# Patient Record
Sex: Female | Born: 1985
Health system: Southern US, Community
[De-identification: ages and names within clinical notes are randomized; demographics above are authoritative.]

## PROBLEM LIST (undated history)

## (undated) ENCOUNTER — Ambulatory Visit (HOSPITAL_COMMUNITY): Admission: EM | Discharge status: Absent without leave | Payer: No Typology Code available for payment source

## (undated) DIAGNOSIS — F4310 Post-traumatic stress disorder, unspecified: Secondary | ICD-10-CM

## (undated) DIAGNOSIS — F410 Panic disorder [episodic paroxysmal anxiety] without agoraphobia: Secondary | ICD-10-CM

## (undated) DIAGNOSIS — J309 Allergic rhinitis, unspecified: Secondary | ICD-10-CM

## (undated) DIAGNOSIS — G43009 Migraine without aura, not intractable, without status migrainosus: Secondary | ICD-10-CM

## (undated) DIAGNOSIS — R51 Headache: Secondary | ICD-10-CM

## (undated) DIAGNOSIS — D582 Other hemoglobinopathies: Secondary | ICD-10-CM

## (undated) DIAGNOSIS — K3184 Gastroparesis: Secondary | ICD-10-CM

## (undated) DIAGNOSIS — R519 Headache, unspecified: Secondary | ICD-10-CM

## (undated) DIAGNOSIS — D332 Benign neoplasm of brain, unspecified: Secondary | ICD-10-CM

## (undated) DIAGNOSIS — R011 Cardiac murmur, unspecified: Secondary | ICD-10-CM

## (undated) DIAGNOSIS — R569 Unspecified convulsions: Secondary | ICD-10-CM

## (undated) DIAGNOSIS — M797 Fibromyalgia: Secondary | ICD-10-CM

## (undated) DIAGNOSIS — Z1331 Encounter for screening for depression: Secondary | ICD-10-CM

## (undated) DIAGNOSIS — I10 Essential (primary) hypertension: Secondary | ICD-10-CM

## (undated) DIAGNOSIS — M549 Dorsalgia, unspecified: Secondary | ICD-10-CM

## (undated) DIAGNOSIS — E559 Vitamin D deficiency, unspecified: Secondary | ICD-10-CM

## (undated) DIAGNOSIS — R52 Pain, unspecified: Secondary | ICD-10-CM

## (undated) DIAGNOSIS — N63 Unspecified lump in unspecified breast: Secondary | ICD-10-CM

## (undated) DIAGNOSIS — F32A Depression, unspecified: Secondary | ICD-10-CM

## (undated) DIAGNOSIS — I499 Cardiac arrhythmia, unspecified: Secondary | ICD-10-CM

## (undated) DIAGNOSIS — M5416 Radiculopathy, lumbar region: Secondary | ICD-10-CM

## (undated) DIAGNOSIS — K219 Gastro-esophageal reflux disease without esophagitis: Secondary | ICD-10-CM

## (undated) DIAGNOSIS — F329 Major depressive disorder, single episode, unspecified: Secondary | ICD-10-CM

## (undated) HISTORY — DX: Headache, unspecified: R51.9

## (undated) HISTORY — DX: Allergic rhinitis, unspecified: J30.9

## (undated) HISTORY — DX: Vitamin D deficiency, unspecified: E55.9

## (undated) HISTORY — DX: Cardiac arrhythmia, unspecified: I49.9

## (undated) HISTORY — DX: Other hemoglobinopathies: D58.2

## (undated) HISTORY — DX: Radiculopathy, lumbar region: M54.16

## (undated) HISTORY — DX: Encounter for screening for depression: Z13.31

## (undated) HISTORY — DX: Pain, unspecified: R52

## (undated) HISTORY — DX: Migraine without aura, not intractable, without status migrainosus: G43.009

## (undated) HISTORY — DX: Headache: R51

## (undated) HISTORY — DX: Gastro-esophageal reflux disease without esophagitis: K21.9

## (undated) HISTORY — DX: Essential (primary) hypertension: I10

## (undated) HISTORY — DX: Cardiac murmur, unspecified: R01.1

## (undated) HISTORY — PX: BREAST BIOPSY: SHX20

## (undated) HISTORY — DX: Dorsalgia, unspecified: M54.9

---
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
## 2004-01-07 ENCOUNTER — Emergency Department (HOSPITAL_COMMUNITY): Admission: EM | Admit: 2004-01-07 | Discharge: 2004-01-07 | Payer: Self-pay | Admitting: Emergency Medicine

## 2004-02-16 ENCOUNTER — Emergency Department (HOSPITAL_COMMUNITY): Admission: EM | Admit: 2004-02-16 | Discharge: 2004-02-16 | Payer: Self-pay | Admitting: Emergency Medicine

## 2004-03-06 ENCOUNTER — Emergency Department (HOSPITAL_COMMUNITY): Admission: EM | Admit: 2004-03-06 | Discharge: 2004-03-07 | Payer: Self-pay | Admitting: Emergency Medicine

## 2004-04-04 ENCOUNTER — Emergency Department (HOSPITAL_COMMUNITY): Admission: EM | Admit: 2004-04-04 | Discharge: 2004-04-04 | Payer: Self-pay | Admitting: Emergency Medicine

## 2004-04-10 ENCOUNTER — Inpatient Hospital Stay (HOSPITAL_COMMUNITY): Admission: AD | Admit: 2004-04-10 | Discharge: 2004-04-10 | Payer: Self-pay | Admitting: Obstetrics and Gynecology

## 2004-04-14 ENCOUNTER — Emergency Department (HOSPITAL_COMMUNITY): Admission: EM | Admit: 2004-04-14 | Discharge: 2004-04-14 | Payer: Self-pay | Admitting: Emergency Medicine

## 2004-05-21 ENCOUNTER — Emergency Department (HOSPITAL_COMMUNITY): Admission: EM | Admit: 2004-05-21 | Discharge: 2004-05-22 | Payer: Self-pay | Admitting: *Deleted

## 2004-07-04 ENCOUNTER — Emergency Department (HOSPITAL_COMMUNITY): Admission: EM | Admit: 2004-07-04 | Discharge: 2004-07-04 | Payer: Self-pay | Admitting: Emergency Medicine

## 2004-07-27 ENCOUNTER — Emergency Department (HOSPITAL_COMMUNITY): Admission: EM | Admit: 2004-07-27 | Discharge: 2004-07-28 | Payer: Self-pay | Admitting: Emergency Medicine

## 2004-12-23 ENCOUNTER — Ambulatory Visit: Payer: Self-pay | Admitting: Family Medicine

## 2005-02-28 ENCOUNTER — Ambulatory Visit: Payer: Self-pay | Admitting: Family Medicine

## 2005-03-12 ENCOUNTER — Ambulatory Visit: Payer: Self-pay | Admitting: Family Medicine

## 2005-03-30 ENCOUNTER — Emergency Department (HOSPITAL_COMMUNITY): Admission: EM | Admit: 2005-03-30 | Discharge: 2005-03-30 | Payer: Self-pay | Admitting: Emergency Medicine

## 2005-04-15 ENCOUNTER — Ambulatory Visit: Payer: Self-pay | Admitting: Psychiatry

## 2005-04-15 ENCOUNTER — Inpatient Hospital Stay (HOSPITAL_COMMUNITY): Admission: RE | Admit: 2005-04-15 | Discharge: 2005-04-19 | Payer: Self-pay | Admitting: Psychiatry

## 2005-04-25 ENCOUNTER — Emergency Department (HOSPITAL_COMMUNITY): Admission: AD | Admit: 2005-04-25 | Discharge: 2005-04-25 | Payer: Self-pay | Admitting: Emergency Medicine

## 2005-06-23 ENCOUNTER — Emergency Department (HOSPITAL_COMMUNITY): Admission: EM | Admit: 2005-06-23 | Discharge: 2005-06-23 | Payer: Self-pay | Admitting: Family Medicine

## 2005-06-30 ENCOUNTER — Ambulatory Visit: Payer: Self-pay | Admitting: Internal Medicine

## 2005-06-30 ENCOUNTER — Inpatient Hospital Stay (HOSPITAL_COMMUNITY): Admission: EM | Admit: 2005-06-30 | Discharge: 2005-07-03 | Payer: Self-pay | Admitting: Emergency Medicine

## 2005-07-03 ENCOUNTER — Inpatient Hospital Stay (HOSPITAL_COMMUNITY): Admission: RE | Admit: 2005-07-03 | Discharge: 2005-07-07 | Payer: Self-pay | Admitting: Psychiatry

## 2005-07-03 ENCOUNTER — Ambulatory Visit: Payer: Self-pay | Admitting: Psychiatry

## 2005-07-13 ENCOUNTER — Emergency Department (HOSPITAL_COMMUNITY): Admission: EM | Admit: 2005-07-13 | Discharge: 2005-07-14 | Payer: Self-pay | Admitting: Emergency Medicine

## 2005-07-29 ENCOUNTER — Emergency Department (HOSPITAL_COMMUNITY): Admission: EM | Admit: 2005-07-29 | Discharge: 2005-07-29 | Payer: Self-pay | Admitting: Family Medicine

## 2005-10-19 ENCOUNTER — Inpatient Hospital Stay (HOSPITAL_COMMUNITY): Admission: EM | Admit: 2005-10-19 | Discharge: 2005-10-24 | Payer: Self-pay | Admitting: Emergency Medicine

## 2005-11-20 ENCOUNTER — Encounter: Payer: Self-pay | Admitting: Family Medicine

## 2005-11-20 ENCOUNTER — Other Ambulatory Visit: Admission: RE | Admit: 2005-11-20 | Discharge: 2005-11-20 | Payer: Self-pay | Admitting: Family Medicine

## 2005-11-20 ENCOUNTER — Ambulatory Visit: Payer: Self-pay | Admitting: Family Medicine

## 2005-11-21 ENCOUNTER — Encounter (INDEPENDENT_AMBULATORY_CARE_PROVIDER_SITE_OTHER): Payer: Self-pay | Admitting: Family Medicine

## 2005-11-21 LAB — CONVERTED CEMR LAB: Pap Smear: NORMAL

## 2005-11-23 ENCOUNTER — Emergency Department (HOSPITAL_COMMUNITY): Admission: EM | Admit: 2005-11-23 | Discharge: 2005-11-23 | Payer: Self-pay | Admitting: Emergency Medicine

## 2005-12-27 ENCOUNTER — Inpatient Hospital Stay (HOSPITAL_COMMUNITY): Admission: EM | Admit: 2005-12-27 | Discharge: 2006-01-01 | Payer: Self-pay | Admitting: Emergency Medicine

## 2005-12-27 DIAGNOSIS — F450 Somatization disorder: Secondary | ICD-10-CM | POA: Insufficient documentation

## 2006-01-01 ENCOUNTER — Ambulatory Visit: Payer: Self-pay | Admitting: Psychiatry

## 2006-01-01 ENCOUNTER — Inpatient Hospital Stay (HOSPITAL_COMMUNITY): Admission: AD | Admit: 2006-01-01 | Discharge: 2006-01-05 | Payer: Self-pay | Admitting: Psychiatry

## 2006-01-04 ENCOUNTER — Encounter: Payer: Self-pay | Admitting: Emergency Medicine

## 2006-02-10 ENCOUNTER — Emergency Department (HOSPITAL_COMMUNITY): Admission: EM | Admit: 2006-02-10 | Discharge: 2006-02-10 | Payer: Self-pay | Admitting: Emergency Medicine

## 2006-02-16 ENCOUNTER — Ambulatory Visit: Payer: Self-pay | Admitting: Family Medicine

## 2006-06-08 ENCOUNTER — Emergency Department (HOSPITAL_COMMUNITY): Admission: EM | Admit: 2006-06-08 | Discharge: 2006-06-08 | Payer: Self-pay | Admitting: Emergency Medicine

## 2006-07-21 ENCOUNTER — Emergency Department (HOSPITAL_COMMUNITY): Admission: EM | Admit: 2006-07-21 | Discharge: 2006-07-21 | Payer: Self-pay | Admitting: Emergency Medicine

## 2006-07-24 ENCOUNTER — Ambulatory Visit: Payer: Self-pay | Admitting: Family Medicine

## 2006-08-28 ENCOUNTER — Emergency Department (HOSPITAL_COMMUNITY): Admission: EM | Admit: 2006-08-28 | Discharge: 2006-08-28 | Payer: Self-pay | Admitting: Emergency Medicine

## 2006-12-06 ENCOUNTER — Emergency Department (HOSPITAL_COMMUNITY): Admission: EM | Admit: 2006-12-06 | Discharge: 2006-12-06 | Payer: Self-pay | Admitting: Emergency Medicine

## 2007-03-08 ENCOUNTER — Emergency Department (HOSPITAL_COMMUNITY): Admission: EM | Admit: 2007-03-08 | Discharge: 2007-03-09 | Payer: Self-pay | Admitting: Emergency Medicine

## 2007-03-10 ENCOUNTER — Emergency Department (HOSPITAL_COMMUNITY): Admission: EM | Admit: 2007-03-10 | Discharge: 2007-03-11 | Payer: Self-pay | Admitting: Emergency Medicine

## 2007-03-13 ENCOUNTER — Inpatient Hospital Stay (HOSPITAL_COMMUNITY): Admission: EM | Admit: 2007-03-13 | Discharge: 2007-03-14 | Payer: Self-pay | Admitting: Emergency Medicine

## 2007-03-21 ENCOUNTER — Emergency Department (HOSPITAL_COMMUNITY): Admission: EM | Admit: 2007-03-21 | Discharge: 2007-03-21 | Payer: Self-pay | Admitting: Emergency Medicine

## 2007-08-02 ENCOUNTER — Telehealth (INDEPENDENT_AMBULATORY_CARE_PROVIDER_SITE_OTHER): Payer: Self-pay | Admitting: *Deleted

## 2007-08-06 ENCOUNTER — Encounter (INDEPENDENT_AMBULATORY_CARE_PROVIDER_SITE_OTHER): Payer: Self-pay | Admitting: Family Medicine

## 2007-08-06 DIAGNOSIS — R569 Unspecified convulsions: Secondary | ICD-10-CM | POA: Insufficient documentation

## 2007-08-06 DIAGNOSIS — F609 Personality disorder, unspecified: Secondary | ICD-10-CM | POA: Insufficient documentation

## 2007-08-06 DIAGNOSIS — F1910 Other psychoactive substance abuse, uncomplicated: Secondary | ICD-10-CM | POA: Insufficient documentation

## 2007-08-06 DIAGNOSIS — F319 Bipolar disorder, unspecified: Secondary | ICD-10-CM | POA: Insufficient documentation

## 2007-08-06 DIAGNOSIS — F1210 Cannabis abuse, uncomplicated: Secondary | ICD-10-CM | POA: Insufficient documentation

## 2007-08-17 ENCOUNTER — Emergency Department (HOSPITAL_COMMUNITY): Admission: EM | Admit: 2007-08-17 | Discharge: 2007-08-17 | Payer: Self-pay | Admitting: *Deleted

## 2007-12-02 ENCOUNTER — Emergency Department (HOSPITAL_COMMUNITY): Admission: EM | Admit: 2007-12-02 | Discharge: 2007-12-02 | Payer: Self-pay | Admitting: Emergency Medicine

## 2008-01-19 ENCOUNTER — Inpatient Hospital Stay (HOSPITAL_COMMUNITY): Admission: AD | Admit: 2008-01-19 | Discharge: 2008-01-20 | Payer: Self-pay | Admitting: Obstetrics and Gynecology

## 2008-03-20 ENCOUNTER — Telehealth (INDEPENDENT_AMBULATORY_CARE_PROVIDER_SITE_OTHER): Payer: Self-pay | Admitting: *Deleted

## 2008-03-23 ENCOUNTER — Encounter (INDEPENDENT_AMBULATORY_CARE_PROVIDER_SITE_OTHER): Payer: Self-pay | Admitting: *Deleted

## 2008-10-17 ENCOUNTER — Emergency Department (HOSPITAL_COMMUNITY): Admission: EM | Admit: 2008-10-17 | Discharge: 2008-10-17 | Payer: Self-pay | Admitting: Emergency Medicine

## 2008-11-28 ENCOUNTER — Emergency Department (HOSPITAL_COMMUNITY): Admission: EM | Admit: 2008-11-28 | Discharge: 2008-11-28 | Payer: Self-pay | Admitting: Emergency Medicine

## 2008-12-30 ENCOUNTER — Emergency Department (HOSPITAL_COMMUNITY): Admission: EM | Admit: 2008-12-30 | Discharge: 2008-12-30 | Payer: Self-pay | Admitting: Emergency Medicine

## 2009-01-06 ENCOUNTER — Emergency Department (HOSPITAL_COMMUNITY): Admission: EM | Admit: 2009-01-06 | Discharge: 2009-01-06 | Payer: Self-pay | Admitting: Physician Assistant

## 2009-05-08 ENCOUNTER — Emergency Department (HOSPITAL_COMMUNITY): Admission: EM | Admit: 2009-05-08 | Discharge: 2009-05-08 | Payer: Self-pay | Admitting: Emergency Medicine

## 2009-09-06 ENCOUNTER — Encounter (INDEPENDENT_AMBULATORY_CARE_PROVIDER_SITE_OTHER): Payer: Self-pay | Admitting: Internal Medicine

## 2009-11-08 ENCOUNTER — Ambulatory Visit: Payer: Self-pay | Admitting: Physician Assistant

## 2009-11-08 DIAGNOSIS — N6459 Other signs and symptoms in breast: Secondary | ICD-10-CM | POA: Insufficient documentation

## 2009-11-08 DIAGNOSIS — N644 Mastodynia: Secondary | ICD-10-CM | POA: Insufficient documentation

## 2009-11-08 DIAGNOSIS — N920 Excessive and frequent menstruation with regular cycle: Secondary | ICD-10-CM | POA: Insufficient documentation

## 2009-11-08 LAB — CONVERTED CEMR LAB
ALT: 11 units/L (ref 0–35)
AST: 13 units/L (ref 0–37)
Albumin: 4.9 g/dL (ref 3.5–5.2)
Alkaline Phosphatase: 70 units/L (ref 39–117)
BUN: 7 mg/dL (ref 6–23)
Basophils Absolute: 0 10*3/uL (ref 0.0–0.1)
Basophils Relative: 0 % (ref 0–1)
Beta hcg, urine, semiquantitative: NEGATIVE
CO2: 23 meq/L (ref 19–32)
Calcium: 9.8 mg/dL (ref 8.4–10.5)
Chloride: 104 meq/L (ref 96–112)
Creatinine, Ser: 0.59 mg/dL (ref 0.40–1.20)
Eosinophils Absolute: 0 10*3/uL (ref 0.0–0.7)
Eosinophils Relative: 0 % (ref 0–5)
Glucose, Bld: 79 mg/dL (ref 70–99)
HCT: 37.5 % (ref 36.0–46.0)
Hemoglobin: 13 g/dL (ref 12.0–15.0)
Lymphocytes Relative: 39 % (ref 12–46)
Lymphs Abs: 1.8 10*3/uL (ref 0.7–4.0)
MCHC: 34.7 g/dL (ref 30.0–36.0)
MCV: 87.4 fL (ref 78.0–100.0)
Monocytes Absolute: 0.3 10*3/uL (ref 0.1–1.0)
Monocytes Relative: 7 % (ref 3–12)
Neutro Abs: 2.5 10*3/uL (ref 1.7–7.7)
Neutrophils Relative %: 54 % (ref 43–77)
Platelets: 248 10*3/uL (ref 150–400)
Potassium: 3.7 meq/L (ref 3.5–5.3)
Prolactin: 6.8 ng/mL
RBC: 4.29 M/uL (ref 3.87–5.11)
RDW: 12.7 % (ref 11.5–15.5)
Sodium: 141 meq/L (ref 135–145)
TSH: 2.52 microintl units/mL (ref 0.350–4.500)
Total Bilirubin: 1.1 mg/dL (ref 0.3–1.2)
Total Protein: 7.6 g/dL (ref 6.0–8.3)
WBC: 4.7 10*3/uL (ref 4.0–10.5)

## 2009-11-13 ENCOUNTER — Encounter: Admission: RE | Admit: 2009-11-13 | Discharge: 2009-11-13 | Payer: Self-pay | Admitting: Internal Medicine

## 2009-11-13 ENCOUNTER — Encounter: Payer: Self-pay | Admitting: Physician Assistant

## 2009-12-14 ENCOUNTER — Ambulatory Visit: Payer: Self-pay | Admitting: Physician Assistant

## 2009-12-14 DIAGNOSIS — A088 Other specified intestinal infections: Secondary | ICD-10-CM | POA: Insufficient documentation

## 2009-12-14 DIAGNOSIS — R042 Hemoptysis: Secondary | ICD-10-CM | POA: Insufficient documentation

## 2009-12-14 LAB — CONVERTED CEMR LAB
Bilirubin Urine: NEGATIVE
Blood in Urine, dipstick: NEGATIVE
Glucose, Urine, Semiquant: NEGATIVE
KOH Prep: NEGATIVE
Ketones, urine, test strip: NEGATIVE
Nitrite: NEGATIVE
Pap Smear: NEGATIVE
Protein, U semiquant: NEGATIVE
Specific Gravity, Urine: <1.005
Urobilinogen, UA: 1
WBC Urine, dipstick: NEGATIVE
Whiff Test: NEGATIVE
pH: 6

## 2009-12-16 LAB — CONVERTED CEMR LAB
Chlamydia, DNA Probe: NEGATIVE
GC Probe Amp, Genital: NEGATIVE

## 2009-12-18 ENCOUNTER — Encounter: Payer: Self-pay | Admitting: Physician Assistant

## 2010-04-07 ENCOUNTER — Emergency Department (HOSPITAL_COMMUNITY): Admission: EM | Admit: 2010-04-07 | Discharge: 2010-04-07 | Payer: Self-pay | Admitting: Emergency Medicine

## 2010-05-03 ENCOUNTER — Emergency Department (HOSPITAL_COMMUNITY): Admission: EM | Admit: 2010-05-03 | Discharge: 2010-05-03 | Payer: Self-pay | Admitting: Family Medicine

## 2010-12-15 ENCOUNTER — Encounter: Payer: Self-pay | Admitting: Internal Medicine

## 2010-12-24 NOTE — Letter (Signed)
Summary: *HSN Results Follow up  [facility]  [address] [place], [place] [address]   Phone: [phone]  Fax: [phone]      11/13/2009   Maria PARDOE [address] [place], [place]  [address]   Dear  [name],                            ____S.Drinkard,FNP   ____D. Gore,FNP       ____B. McPherson,MD   ____V. Rankins,MD    ____E. Mulberry,MD    ____N. Daphine Deutscher, FNP  ____D. Reche Dixon, MD    ____K. Philipp Deputy, MD    __x__S. Alben Spittle, PA-C     This letter is to inform you that your recent test(s):  _______Pap Smear    ____x___Lab Test     _______X-ray    ___x____ is within acceptable limits  _______ requires a medication change  _______ requires a follow-up lab visit  _______ requires a follow-up visit with your provider   Comments:       _________________________________________________________ If you have any questions, please contact our office                     Sincerely,  Tereso Newcomer PA-C [facility]

## 2010-12-24 NOTE — Assessment & Plan Note (Signed)
Summary: breast discharge/ gk   Vital Signs:  Patient profile:   25 year old female Menstrual status:  irregular LMP:     10/24/2009 Height:      67 inches Weight:      131 pounds BMI:     20.59 Temp:     98.4 degrees F oral Pulse rate:   88 / minute Pulse rhythm:   regular Resp:     18 per minute BP sitting:   123 / 76  (left arm)  Vitals Entered By: Arm[place] Shannon (November 08, 2009 11:49 AM) CC: pt says she has lumps in her breast and her breast gives her pain...Marland KitchenMarland Kitchen pt says she has had this pain for over a year...Marland KitchenMarland Kitchen pt says the discharge was clear..... Is Patient Diabetic? No Pain Assessment Patient in pain? no       Does patient need assistance? Functional Status Self care Ambulation Normal LMP (date): 10/24/2009     Menstrual Status irregular Enter LMP: 10/24/2009 Last PAP Result Normal   CC:  pt says she has lumps in her breast and her breast gives her pain...Marland KitchenMarland Kitchen pt says she has had this pain for over a year...Marland KitchenMarland Kitchen pt says the discharge was clear.....Marland Kitchen  History of Present Illness: First meeting with patient.  Previously followed by Dr. Barbaraann Barthel.  Patient with a h/o bipolar d/o and somatization d/o.  She follows at [facility].  Reported h/o seizure d/o never documented.  She has sickle cell trait but according to records continues to report she has sickle cell disease.  Paper chart reviewed in office today.  She presents with pain and swelling bilat breasts x 1 year+.  She says the breasts both feel hot.  She notes swelling around her nipple - not assoc with menses.  She notes discharge from left breast 12/2008 - went to ED; referred to breast center; never went.  No further discharge.  The discharge was clear; no blood or colored d/c.  The discomfort is constant- all month long.  She states she has felt some lumps in both breasts; no single lesion.  The tenderness may be a little worse around cycle.  She has irregular menses and reports cycles 2 x per month; heavy; clots.  Her  LMP 12.3.2010.  [facility]    Allergies (verified): 1)  ! Jonne Ply  Past History:  Past Medical History: Last updated: 08/06/2007 Sickle Cell Trait(pt does not have SCD Seizure disorder(not confirmed, see Dr.Hicklings' evaluation 7/0:  NARCOTIC ABUSE (ICD-305.90) MARIJUANA ABUSE (ICD-305.20) PERSONALITY DISORDER (ICD-301.9) DISORDER, BIPOLAR NOS (ICD-296.80)  Past Surgical History: Last updated: 08/06/2007 February 2007: Hospitalized for Bipolar Disorder  Family History: Breast CA - MGM Brain CA - mom  Social History: sexually active - single partner drinks tea occ. + cigs (3-4 per day) no ETOH; denies drug abuse  Physical Exam  General:  alert, well-developed, and well-nourished.   Head:  normocephalic and atraumatic.   Breasts:  skin/areolae normal, no masses, no abnormal thickening, and no nipple discharge.   fibrous changes noted exquisite tenderness with light palpation of both breasts  Neurologic:  alert & oriented X3 and cranial nerves II-XII intact.   Skin:  turgor normal.   Psych:  normally interactive.     Impression & Recommendations:  Problem # 1:  NIPPLE DISCHARGE (ICD-611.79) not sure if she experienced multiductal or uniductal d/c no recurrence check labs order u/s  Orders: Urine Pregnancy Test  (62952) T-Comprehensive Metabolic Panel (84132-44010) T-CBC w/Diff (27253-66440)  T-TSH [phone]) T-Prolactin [phone]) Ultrasound (Ultrasound)  Problem # 2:  BREAST TENDERNESS (ICD-611.71)  suspect fibrocystic breast disease rec NSAIDs reassurance breast ultrasound given discharge seen avoid caffeine and smoking check urine preg  Orders: Urine Pregnancy Test  (84696) T-Comprehensive Metabolic Panel (29528-41324) T-CBC w/Diff (40102-72536) T-TSH (64403-47425) T-Prolactin (95638-75643) Ultrasound (Ultrasound)  Problem # 3:  Preventive Health Care (ICD-V70.0) offered pap today she declined will  reschedule  Problem # 4:  MENORRHAGIA (ICD-626.2) schedule pap ultrasound in 2009 without fibroids; she did have ovarian cysts may benefit from OCPs  Complete Medication List: 1)  Colace 100 Mg Caps (Docusate sodium) .... Two times a day 2)  Risperdal 0.25 Mg Tabs (Risperidone) .Marland Kitchen.. 1 by mouth every morning (per [facility]) 3)  Xanax 0.5 Mg Tabs (Alprazolam) .... Three times a day (per [facility]) 4)  Valium 10 Mg Tabs (Diazepam) .... Two times a day (per [facility]) 5)  Naprosyn 500 Mg Tabs (Naproxen) .... Take 1 tablet by mouth two times a day with food as needed for pain.  Patient Instructions: 1)  Try to eliminate caffeine from your diet. 2)  Tobacco is very bad for your health and your loved ones ! You should stop smoking !  3)  Stop smoking tips: Choose a quit date. Cut down before the quit date. Decide what you will do as a substitute when you feel the urge to smoke(gum, toothpick, exercise).  4)  Please schedule a follow-up appointment in 1 month with Scott for CPP. 5)  Use tylenol or naprosyn for breast pain.  Try to use tylenol first and limit the amount of naprosyn you use.  6)  Take 650 - 1000 mg of tylenol every 4-6 hours as needed for relief of pain or comfort of fever. Avoid taking more than 4000 mg in a 24 hour period( can cause liver damage in higher doses).  Prescriptions: NAPROSYN 500 MG TABS (NAPROXEN) Take 1 tablet by mouth two times a day with food as needed for pain.  #30 x 3   Entered and Authorized by:   Tereso Newcomer PA-C   Signed by:   Tereso Newcomer PA-C on 11/08/2009   Method used:   Print then Give to Patient   RxID:   [id number]    Pelvic US  Procedure date:  01/20/2008  Findings:      IMPRESSION   1. Bilateral ovarian cysts. No followup required. Otherwise negative   pelvic   ultrasound.  Laboratory Results   Urine Tests      Urine HCG: negative

## 2010-12-24 NOTE — Progress Notes (Signed)
Summary: IN PAIN  Phone Note Call from Patient Call back at [phone]   Caller: Patient Reason for Call: Acute Illness Summary of Call: RANKINS PT. HAS A LOT OF PAIN IN HER NECK,SHOULDER AND BOTH LEGS AND WOULD LIKE TO KNOW  WHAT SHOULD SHE DO. 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

## 2010-12-24 NOTE — Letter (Signed)
Summary: *HSN Results Follow up  [facility]  [address] [place], [place] [address]   Phone: [phone]  Fax: [phone]      12/18/2009   [name] [address] [place], [place]  [address]   Dear  [name],                            ____S.Drinkard,FNP   ____D. Gore,FNP       ____B. McPherson,MD   ____V. Rankins,MD    ____E. Mulberry,MD    ____N. Daphine Deutscher, FNP  ____D. Reche Dixon, MD    ____K. Philipp Deputy, MD    __x__S. Alben Spittle, PA-C     This letter is to inform you that your recent test(s):  __x_____Pap Smear    _______Lab Test     _______X-ray    ___x____ is within acceptable limits  _______ requires a medication change  _______ requires a follow-up lab visit  _______ requires a follow-up visit with your provider   Comments:       _________________________________________________________ If you have any questions, please contact our office                     Sincerely,  Tereso Newcomer PA-C [facility]

## 2010-12-24 NOTE — Letter (Signed)
Summary: MAILED RECORDS TO [facility]  MAILED RECORDS TO [facility]   Imported By: Arta Bruce 09/06/2009 11:00:13  _____________________________________________________________________  External Attachment:    Type:   Image     Comment:   External Document

## 2010-12-24 NOTE — Progress Notes (Signed)
Summary: Office Visit//DEPRESSION SCREENING  Office Visit//DEPRESSION SCREENING   Imported By: Arta Bruce 02/08/2010 12:37:37  _____________________________________________________________________  External Attachment:    Type:   Image     Comment:   External Document

## 2010-12-24 NOTE — Assessment & Plan Note (Signed)
Summary: CPP   Vital Signs:  Patient profile:   25 year old female Menstrual status:  irregular Height:      67 inches Weight:      127 pounds BMI:     19.96 Temp:     100.7 degrees F oral Pulse rate:   126 / minute Pulse rhythm:   regular Resp:     16 per minute BP sitting:   114 / 78  (left arm) Cuff size:   regular  Vitals Entered By: Arm[place] Shannon (December 14, 2009 10:15 AM) CC: CPP... pt doesn't feel good...Marland KitchenMarland Kitchen pt says she has been feeling like this for 3  days now..... pt says she has been coughing, shivers, headache and she says that it feels as if her skin is burning... Is Patient Diabetic? No Pain Assessment Patient in pain? no       Does patient need assistance? Functional Status Self care Ambulation Normal   CC:  CPP... pt doesn't feel good...Marland KitchenMarland Kitchen pt says she has been feeling like this for 3  days now..... pt says she has been coughing, shivers, and headache and she says that it feels as if her skin is burning....  History of Present Illness: Here for CPP.  PHQ9=17.  Follows at [facility].  Has a h/o bipolar d/o.  No suicidal ideations.  Has f/u appt next week. Feels like her meds need adjustment.  Health maint: G1P0 No h/o abnormal pap. She does report 2 cycles per month, heavy bleeding and clotting.  Used depo years ago with good success.  No h/o ultrasound.  Did have a h/o miscarriage. No vaginal discharge, odor, burning. Recent ultrasound of breasts due to mastalgia benign.  No need for mammo until she is 39.  Patient notes fever, chills, cough, nausea and vomiting over the last 3 days.  She has coughed up some blood.  No dyspnea.  Notes nasal congestion.  SHe is a smoker.  No chest pain.  Unable to keep any fluids down.  No syncope.  No lightheadedness.  Diarrhea started this morning.    Habits & Providers  Alcohol-Tobacco-Diet     Alcohol drinks/day: 0     Tobacco Status: current     Cigarette Packs/Day: <0.25  Exercise-Depression-Behavior     Does  Patient Exercise: yes     Type of exercise: walking     Times/week: <3     Have you felt down or hopeless? yes     Have you felt little pleasure in things? yes     Depression Counseling: follows with [facility]     STD Risk: never     Drug Use: marijuanna     Seat Belt Use: always  Problems Prior to Update: 1)  Hemoptysis Unspecified  (ICD-786.30) 2)  Preventive Health Care  (ICD-V70.0) 3)  Gastroenteritis, Viral  (ICD-008.8) 4)  Routine Gynecological Examination  (ICD-V72.31) 5)  Menorrhagia  (ICD-626.2) 6)  Nipple Discharge  (ICD-611.79) 7)  Breast Tenderness  (ICD-611.71) 8)  Somatization Disorder  (ICD-300.81) 9)  Seizure Disorder  (ICD-780.39) 10)  Narcotic Abuse  (ICD-305.90) 11)  Marijuana Abuse  (ICD-305.20) 12)  Personality Disorder  (ICD-301.9) 13)  Disorder, Bipolar Nos  (ICD-296.80)  Current Medications (verified): 1)  Colace 100 Mg  Caps (Docusate Sodium) .... Two Times A Day 2)  Risperdal 0.25 Mg Tabs (Risperidone) .Marland Kitchen.. 1 By Mouth Every Morning (Per [facility]) 3)  Xanax 0.5 Mg Tabs (Alprazolam) .... Three Times A Day (Per [facility]) 4)  Valium 10 Mg Tabs (  Diazepam) .... Two Times A Day (Per [facility]) 5)  Naprosyn 500 Mg Tabs (Naproxen) .... Take 1 Tablet By Mouth Two Times A Day With Food As Needed For Pain.  Allergies (verified): 1)  ! Jonne Ply  Past History:  Past Medical History: Last updated: 08/06/2007 Sickle Cell Trait(pt does not have SCD Seizure disorder(not confirmed, see Dr.Hicklings' evaluation 7/0:  NARCOTIC ABUSE (ICD-305.90) MARIJUANA ABUSE (ICD-305.20) PERSONALITY DISORDER (ICD-301.9) DISORDER, BIPOLAR NOS (ICD-296.80)  Past Surgical History: Last updated: 08/06/2007 February 2007: Hospitalized for Bipolar Disorder  Family History: Reviewed history from 11/08/2009 and no changes required. Breast CA - MGM Brain CA - mom  Social History: Smoking Status:  current Packs/Day:  <0.25 Drug Use:  marijuanna Does Patient Exercise:  yes Seat Belt Use:   always STD Risk:  never  Review of Systems       The patient complains of fever.  The patient denies chest pain, syncope, dyspnea on exertion, melena, hematochezia, and hematuria.    Physical Exam  General:  alert, well-developed, and well-nourished.   Head:  normocephalic and atraumatic.   Eyes:  pupils equal, pupils round, and pupils reactive to light.   Ears:  R ear normal and L ear normal.   Nose:  no external deformity.   Mouth:  pharynx pink and moist.   Neck:  supple, no thyromegaly, and no cervical lymphadenopathy.   Breasts:  deferred due to recent ultrasound Lungs:  normal breath sounds, no crackles, and no wheezes.   Heart:  regular rhythm, no murmur, and tachycardia.   Abdomen:  soft, non-tender, normal bowel sounds, and no hepatomegaly.   Rectal:  no external abnormalities.   Genitalia:  normal introitus, no external lesions, no vaginal discharge, mucosa pink and moist, no vaginal or cervical lesions, no vaginal atrophy, no friaility or hemorrhage, normal uterus size and position, and no adnexal masses or tenderness.   Msk:  normal ROM.   Extremities:  no edema Neurologic:  alert & oriented X3 and cranial nerves II-XII intact.   Skin:  turgor normal.   Psych:  normally interactive.     Impression & Recommendations:  Problem # 1:  ROUTINE GYNECOLOGICAL EXAMINATION (ICD-V72.31)  Orders: KOH/ WET [place] [id number]) T- GC Chlamydia (47829) T-Pap Smear, Thin Prep (56213) T-Syphilis Test (RPR) (08657-84696) T-HIV Antibody  (Reflex) (29528-41324)  Problem # 2:  GASTROENTERITIS, VIRAL (ICD-008.8)  give 1L IVF in the office as she is dehydrated as evidenced by tachycardia phenergan IM in the office rx for phenergan f/u next week if no better  Orders: IV Fluids 1st hr Non Medicare (40102)  Problem # 3:  MENORRHAGIA (ICD-626.2)  would like to try Depo again check ultrasound  Orders: Ultrasound (Ultrasound)  Problem # 4:  DISORDER, BIPOLAR NOS (ICD-296.80) f/u  with [facility]  Problem # 5:  PREVENTIVE HEALTH CARE (ICD-V70.0)  Orders: T-Lipid Profile (72536-64403)  Problem # 6:  HEMOPTYSIS UNSPECIFIED (ICD-786.30)  prob related to cough and gagging from gastroenteritis but since she is a smoker will check xray  Orders: Diagnostic X-Ray/Fluoroscopy (Diagnostic X-Ray/Flu) Admin of Therapeutic Inj  intramuscular or subcutaneous (47425) Promethazine up to 50mg  (J2550)  Complete Medication List: 1)  Colace 100 Mg Caps (Docusate sodium) .... Two times a day 2)  Risperdal 0.25 Mg Tabs (Risperidone) .Marland Kitchen.. 1 by mouth every morning (per [facility]) 3)  Xanax 0.5 Mg Tabs (Alprazolam) .... Three times a day (per [facility]) 4)  Valium 10 Mg Tabs (Diazepam) .... Two times a day (per [facility]) 5)  Naprosyn 500  Mg Tabs (Naproxen) .... Take 1 tablet by mouth two times a day with food as needed for pain. 6)  Promethazine Hcl 25 Mg Tabs (Promethazine hcl) .... 1/2 to 1 by mouth every 8 hours as needed [place] and vomiting  Patient Instructions: 1)  Take Calcium 600 mg + Vitamin D 400 International Units two times a day for bone health. 2)  Tobacco is very bad for your health and your loved ones ! You should stop smoking !  3)  Stop smoking tips: Choose a quit date. Cut down before the quit date. Decide what you will do as a substitute when you feel the urge to smoke(gum, toothpick, exercise).  4)  It is important that you exercise reguarly at least 20 minutes 5 times a week. If you develop chest pain, have severe difficulty breathing, or feel very tired, stop exercising immediately and seek medical attention.  5)  Schedule a follow up appointment with Lorin Picket next week if you are no better. 6)  If you feel worse over the weekend, go to the emergency room. 7)  Drink clear liquids for the next 24-48 hours. 8)  Do not use the phenergan unless you have to because this medicine will make you tired and all your other medicine make you tired as well. 9)  Get the chest xray as soon as you  can.  10)  Call me when you feel better so we can set you up for the Depo shot. 11)  We will send you a letter about your pap results.  12)  Make sure you arrange a follow up pap in one year. Prescriptions: PROMETHAZINE HCL 25 MG TABS (PROMETHAZINE HCL) 1/2 to 1 by mouth every 8 hours as needed [place] and vomiting  #20 x 0   Entered and Authorized by:   Tereso Newcomer PA-C   Signed by:   Tereso Newcomer PA-C on 12/14/2009   Method used:   Print then Give to Patient   RxID:   [id number]   Laboratory Results   Urine Tests  Date/Time Received: December 14, 2009 10:23 AM   Routine Urinalysis   Glucose: negative   (Normal Range: Negative) Bilirubin: negative   (Normal Range: Negative) Ketone: negative   (Normal Range: Negative) Spec. Gravity: <1.005   (Normal Range: 1.003-1.035) Blood: negative   (Normal Range: Negative) pH: 6.0   (Normal Range: 5.0-8.0) Protein: negative   (Normal Range: Negative) Urobilinogen: 1.0   (Normal Range: 0-1) Nitrite: negative   (Normal Range: Negative) Leukocyte Esterace: negative   (Normal Range: Negative)      Wet Mount Source: vaginal WBC/hpf: 1-5 Bacteria/hpf: rare Clue cells/hpf: few  Negative whiff Yeast/hpf: none Wet [place] KOH: Negative Trichomonas/hpf: none        Medication Administration  Injection # 1:    Medication: Promethazine up to 50mg     Diagnosis: GASTROENTERITIS, VIRAL (ICD-008.8)    Route: IM    Site: LUOQ gluteus    Exp Date: 03/2010    Lot #: [id number]    Mfr: [place]    Comments: 25mg  ndc:  9528-4132-44    Patient tolerated injection without complications    Given by: Armenia Shannon (December 14, 2009 2:30 PM)  Orders Added: 1)  Est. Patient age 43-39 [32] 2)  KOH/ [place] [id number] 3)  T- GC Chlamydia [25366] 4)  T-Pap Smear, Thin Prep [88142] 5)  T-Syphilis Test (RPR) [44034-74259] 6)  T-HIV Antibody  (Reflex) [56387-56433] 7)  Ultrasound [Ultrasound] 8)  T-Lipid Profile [  04540-98119] 9)   Diagnostic X-Ray/Fluoroscopy [Diagnostic X-Ray/Flu] 10)  IV Fluids 1st hr Non Medicare [96360] 11)  Admin of Therapeutic Inj  intramuscular or subcutaneous [96372] 12)  Promethazine up to 50mg  [J2550]

## 2010-12-24 NOTE — Letter (Signed)
Summary: Generic Letter  [facility]  [address] [place], [place] [address]   Phone: [phone]  Fax: [phone]    03/23/2008   [name] [address] [place], [place]  [address]   Dear [name],   Good Afternoon, Our office has been trying to follow up with you regarding your telephone call. If you are still in need of our services please call our office for our help. We look forward to providing your healthcare in the future.      Sincerely,     Mikey College CMA [facility]

## 2010-12-24 NOTE — Progress Notes (Signed)
  Phone Note Call from Patient Call back at Home Phone [phone]   Caller: Patient Summary of Call: Even though,  I set up an appo on May 19; she wants to see if she can come sometime soon because she has ovary, back and stomach pain. Dr. Barbaraann Barthel Initial call taken by: Manon Hilding,  March 20, 2008 11:59 AM  Follow-up for Phone Call        left message on answer machine for pt. to return call  Follow-up by: Gaylyn Cheers RN,  March 21, 2008 1:39 PM  Additional Follow-up for Phone Call Additional follow up Details #1::        left message to return call. Additional Follow-up by: Mikey College CMA,  March 23, 2008 11:58 AM    Additional Follow-up for Phone Call Additional follow up Details #2::    LEFT MESSAGE TO RETURN CALL.Marland KitchenMarland KitchenMikey College CMA  Mar 27, 2008 9:53 AM

## 2011-02-10 LAB — POCT URINALYSIS DIP (DEVICE)
Bilirubin Urine: NEGATIVE
Glucose, UA: NEGATIVE mg/dL
Hgb urine dipstick: NEGATIVE
Ketones, ur: NEGATIVE mg/dL
Nitrite: NEGATIVE
Protein, ur: NEGATIVE mg/dL
Specific Gravity, Urine: 1.015 (ref 1.005–1.030)
Urobilinogen, UA: 1 mg/dL (ref 0.0–1.0)
pH: 7 (ref 5.0–8.0)

## 2011-02-10 LAB — POCT PREGNANCY, URINE: Preg Test, Ur: NEGATIVE

## 2011-03-03 LAB — DIFFERENTIAL
Basophils Absolute: 0 10*3/uL (ref 0.0–0.1)
Basophils Relative: 0 % (ref 0–1)
Eosinophils Absolute: 0 10*3/uL (ref 0.0–0.7)
Eosinophils Relative: 0 % (ref 0–5)
Lymphocytes Relative: 33 % (ref 12–46)
Lymphs Abs: 1.8 10*3/uL (ref 0.7–4.0)
Monocytes Absolute: 0.3 10*3/uL (ref 0.1–1.0)
Monocytes Relative: 6 % (ref 3–12)
Neutro Abs: 3.3 10*3/uL (ref 1.7–7.7)
Neutrophils Relative %: 61 % (ref 43–77)

## 2011-03-03 LAB — COMPREHENSIVE METABOLIC PANEL
ALT: 13 U/L (ref 0–35)
AST: 21 U/L (ref 0–37)
Albumin: 3.7 g/dL (ref 3.5–5.2)
Alkaline Phosphatase: 63 U/L (ref 39–117)
BUN: 9 mg/dL (ref 6–23)
CO2: 28 mEq/L (ref 19–32)
Calcium: 8.8 mg/dL (ref 8.4–10.5)
Chloride: 109 mEq/L (ref 96–112)
Creatinine, Ser: 0.64 mg/dL (ref 0.4–1.2)
GFR calc Af Amer: >60 mL/min (ref >60)
GFR calc non Af Amer: >60 mL/min (ref >60)
Glucose, Bld: 83 mg/dL (ref 70–99)
Potassium: 3.8 mEq/L (ref 3.5–5.1)
Sodium: 140 mEq/L (ref 135–145)
Total Bilirubin: 1.2 mg/dL (ref 0.3–1.2)
Total Protein: 6.4 g/dL (ref 6.0–8.3)

## 2011-03-03 LAB — CBC
HCT: 35.5 % — ABNORMAL LOW (ref 36.0–46.0)
Hemoglobin: 11.9 g/dL — ABNORMAL LOW (ref 12.0–15.0)
MCHC: 33.5 g/dL (ref 30.0–36.0)
MCV: 93.3 fL (ref 78.0–100.0)
Platelets: 213 10*3/uL (ref 150–400)
RBC: 3.8 MIL/uL — ABNORMAL LOW (ref 3.87–5.11)
RDW: 12.7 % (ref 11.5–15.5)
WBC: 5.4 10*3/uL (ref 4.0–10.5)

## 2011-03-03 LAB — LIPASE, BLOOD: Lipase: 15 U/L (ref 11–59)

## 2011-03-05 ENCOUNTER — Emergency Department (HOSPITAL_COMMUNITY)
Admission: EM | Admit: 2011-03-05 | Discharge: 2011-03-05 | Disposition: A | Discharge status: Home / Self Care | Payer: Self-pay | Attending: Emergency Medicine | Admitting: Emergency Medicine

## 2011-03-05 DIAGNOSIS — J029 Acute pharyngitis, unspecified: Secondary | ICD-10-CM | POA: Insufficient documentation

## 2011-03-05 DIAGNOSIS — R599 Enlarged lymph nodes, unspecified: Secondary | ICD-10-CM | POA: Insufficient documentation

## 2011-03-05 DIAGNOSIS — D571 Sickle-cell disease without crisis: Secondary | ICD-10-CM | POA: Insufficient documentation

## 2011-03-05 LAB — RAPID STREP SCREEN (MED CTR MEBANE ONLY): Streptococcus, Group A Screen (Direct): NEGATIVE

## 2011-03-10 LAB — URINALYSIS, ROUTINE W REFLEX MICROSCOPIC
Bilirubin Urine: NEGATIVE
Glucose, UA: NEGATIVE mg/dL
Ketones, ur: NEGATIVE mg/dL
Leukocytes, UA: NEGATIVE
Nitrite: NEGATIVE
Protein, ur: NEGATIVE mg/dL
Specific Gravity, Urine: 1.005 (ref 1.005–1.030)
Urobilinogen, UA: 1 mg/dL (ref 0.0–1.0)
pH: 6 (ref 5.0–8.0)

## 2011-03-10 LAB — URINE MICROSCOPIC-ADD ON

## 2011-03-10 LAB — POCT PREGNANCY, URINE: Preg Test, Ur: NEGATIVE

## 2011-03-11 LAB — PROLACTIN: Prolactin: 14.8 ng/mL
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
## 2011-04-11 NOTE — Discharge Summary (Signed)
[name], [name] NO.:  [id number]   MEDICAL RECORD NO.:  [id number]          PATIENT TYPE:  INP   LOCATION:  1521                         FACILITY:  [facility]   PHYSICIAN:  Nelma Rothman, MD   DATE OF BIRTH:  Apr 07, 1986   DATE OF ADMISSION:  10/19/2005  DATE OF DISCHARGE:  10/24/2005                                 DISCHARGE SUMMARY   PRIMARY CARE PHYSICIAN:  [facility].   DISCHARGE DIAGNOSES:  1.  Sickle cell disease pain crisis.  2.  Urinary tract infection.  3.  History of bipolar disorder.  4.  Reported history of seizure disorder of unclear etiology, no seizure      during hospitalization.   HISTORY OF PRESENT ILLNESS:  Ms. [name] is a 25 year old female with a  history of sickle disease who presented to the [facility] Emergency  Department on October 19, 2005 with a number of complaints including back,  abdominal, and leg pain.  She also reported five days of nausea and  vomiting.  Review of systems was grossly positive.   LABORATORY DATA:  Discharge CBC included a white blood cell count of 5.3,  hemoglobin 10.7, hematocrit 32.7, and platelet count 199.  Sodium 136,  potassium 3.9, chloride 106, bicarbonate 26, BUN 1, and creatinine 0.7.  Liver function tests were within normal limits.  Amylase was 82, which is  within normal limits.  A urine pregnancy test was negative and Phenytoin  levels were undetectable.  Urinalysis demonstrated small leukocyte esterase  with a few bacteria and 7-10 white blood cells.  Urine cultures were  negative.   PROCEDURES:  1.  Chest x-ray on admission was negative.  2.  Renal ultrasound on October 23, 2005 demonstrated no abnormality with      the kidneys measuring 11.2 cm on the right and 11.7 cm on the left.   HOSPITAL COURSE:  Ms. [name] is a 25 year old female with a history of  sickle cell disease who presented with some vague complaints of chest, back,  leg, and abdominal pain, as well as some nausea  and vomiting.  The nausea  and vomiting resolved spontaneously without specific intervention.  Guaiac  stool was negative.  Given the lack of localizing symptoms, her presentation  was felt to be most likely consistent with a sickle cell pain crisis and she  was treated accordingly.  She was started on aggressive IV fluids and  continued on Hydrea, as well as folate.  She was started on a morphine PCA,  which provided good pain relief.  On the morning prior to discharge, she was  receiving less than 1 mg/hour of morphine, was feeling much better, and  requested to be discharged home.   On presentation, her urinalysis was consistent with a urinary tract  infection.  She was initially treated with oral ciprofloxacin.  However,  this was changed to IV ceftriaxone a few days into her hospitalization when  she continued to complain of some flank pain.  There was some concern for  pyelonephritis.  A renal ultrasound was unremarkable.  Her urine cultures  subsequently were negative.  Therefore, she has completed a five day course  of empiric antibiotics and this will be discontinued.   On the morning of discharge, she was afebrile with vital signs stable.  She  was feeling much better and back to her usual state of health.  She will be  discharged home today with followup already scheduled with [facility]  later this month.  She was not started on any new medications except I have  encouraged her to take a folate supplement, either alone as 1 mg daily or in  combination with a prenatal vitamin.  She will continue Hydrea and other  medications as previously prescribed.   DISCHARGE MEDICATIONS:  1.  Hydrea 2 g p.o. q.h.s.  2.  Zoloft 100 mg p.o. daily.  3.  Zyprexa 5 mg p.o. daily.  4.  Dilantin 100 mg p.o. daily.  5.  Folate 1 mg p.o. daily.   DISCHARGE INSTRUCTIONS:  The patient is to follow up at [facility] as  previously scheduled on November 05, 2005. She was instructed to take a   folate supplement as described above.           ______________________________  Nelma Rothman, MD     RAR/MEDQ  D:  10/24/2005  T:  10/25/2005  Job:  [id number]
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
## 2011-04-11 NOTE — H&P (Signed)
NAMEEVADNA, DONAGHY NO.:  [id number]   MEDICAL RECORD NO.:  [id number]          PATIENT TYPE:  INP   LOCATION:  0102                         FACILITY:  [facility]   PHYSICIAN:  Maria Quarry, MD      DATE OF BIRTH:  1986/09/09   DATE OF ADMISSION:  12/27/2005  DATE OF DISCHARGE:                                HISTORY & PHYSICAL   [name] is a 25 year old lady with a history of multiple previous  hospitalizations for complaints of generalized pain attributed to sickle  cell disease.  She was last discharged from the hospital on October 24, 2005.  She states that she filled her prescriptions at that time but ran out  of her medicines about three weeks ago.  Over the last 48 hours, she has  developed a generalized feeling of tingling in her legs associated with  aching, throbbing pain in her thighs and calves as well as aching soreness  in the chest wall and proximal arms.  She states that it is difficult for  her to take a deep breath and that she has been feeling nauseated.  In the  emergency room, efforts were made to manage her pain but she continues to  complain of severe discomfort and therefore, decision was made to admit her  to the hospital for further evaluation.   PAST MEDICAL HISTORY:   MEDICATIONS:  The patient's medications are supposed to be:  1.  Hydroxyurea 2 grams at bedtime daily.  2.  Zoloft 50 mg daily.  3.  Zyprexa 5 mg daily.  4.  Dilantin 100 mg t.i.d.  5.  Oxycodone 5 mg p.r.n. for pain.   As previously mentioned she has not taken these medicines for approximately  three weeks.   ALLERGIES:  1.  She reports that she is allergic to ASPIRIN.  2.  Previous records have indicated that she is allergic to DILAUDID but she      denies this.   OPERATIONS:  She states she has had no surgical procedures.   MEDICAL ILLNESSES:  1.  Bipolar disorder.  The patient has had multiple psychiatric admissions      for bipolar disorder and for  suicide attempts.  She states that she is      currently under the care of a Dr. Allyne [name] for her psychiatric problems.  2.  Seizure disorder.  She states that she was diagnosed with a seizure      disorder in 2003.  The details of this are unclear.  3.  Sickle cell disease.  The patient states that she was first diagnosed      with sickle cell disease at age 68 but that she was never hospitalized      for any problems related to sickle cell disease until two years ago.      She was evaluated at [facility] for this problem but is not currently      following up with that institution.   FAMILY HISTORY:  Her father died at age 36 because of sickle cell disease.  She states that her mother  is 40 and is in good health.  She has three  sisters one of whom has sickle cell trait.   SOCIAL HISTORY:  The patient resides with her grandmother.  She states that  she is still a Consulting civil engineer at [employer]  She uses marijuana on a regular  basis and smokes about five cigarettes per day.  She does not abuse alcohol.   REVIEW OF SYSTEMS:  This is somewhat difficult to obtain as the patient  answers positively to all questions.  HEAD:  She reports that she has a dull  headache as well as dizziness.  EAR/NOSE/THROAT:  She denies earache.  She  states her sinuses feel full.  CHEST:  She reports that her chest feels  heavy.  She states that she feels mildly dyspneic.  CARDIOVASCULAR:  She  denies orthopnea, PND, or ankle edema.  GI:  She reports chronic nausea.  She states that her abdomen is sore.  She states her bowels are functioning  normally.  GU:  She denies dysuria or urinary frequency.  GYN:  She states  she is currently having a menstrual period.  NEURO:  There is no history of  seizure or stroke recently.  ENDO:  She denies excessive thirst, urinary  frequency, or nocturia.   PHYSICAL EXAMINATION:  GENERAL:  She is an apparently healthy-appearing  woman who reports diffuse pain.  HEENT:  Within normal  limits.  CHEST:  Clear.  BACK:  Reveals no CVA or point tenderness.  CARDIOVASCULAR:  Reveals normal S1 and S2.  There are no rubs, murmurs, or  gallops.  ABDOMEN:  Benign.  There are normal bowel sounds.  There are no masses or  tenderness.  No guarding or rebound.  NEUROLOGIC:  Testing.  EXTREMITIES:  Normal.   LABORATORY STUDIES:  Relevant lab studies include reticulocyte count which  is 0.9, absolute reticulocytes are 36.5.  A hemoglobin is 12.3.   IMPRESSION:  1.  Sickle cell pain crisis.  2.  Bipolar disorder.  3.  History of multiple attempts.  4.  Chronic marijuana usage.  5.  Noncompliance with medical advice.   PLAN:  1.  We will admit this patient for observation and treatment.  2.  Her history of sickle cell disease is somewhat atypical, given the fact      that the painful crises seem to have begun relatively recently.  It      might be useful to obtain some documentation as to the status of her      sickle cell disease.  3.  In the emergency room, she has received a loading dose of Dilantin and      we will resume her Dilantin therapy as well as her psychiatric      medications.           ______________________________  Maria Quarry, MD     SY/MEDQ  D:  12/27/2005  T:  12/27/2005  Job:  [id number]   cc:   [name], M.D.  Fax: [phone]

## 2011-04-11 NOTE — H&P (Signed)
[name], HUSBAND NO.:  [id number]   MEDICAL RECORD NO.:  [id number]          PATIENT TYPE:  INP   LOCATION:  0101                         FACILITY:  [facility]   PHYSICIAN:  Hillery Aldo, M.D.   DATE OF BIRTH:  29-Mar-1986   DATE OF ADMISSION:  10/19/2005  DATE OF DISCHARGE:                                HISTORY & PHYSICAL   PRIMARY CARE PHYSICIAN:  Turkey R. Rankins, M.D. at [facility].   CHIEF COMPLAINT:  Weakness, muscle pain, nausea and vomiting.   HISTORY OF PRESENT ILLNESS:  The patient is a 25 year old female who  presents with a 24-hour history of generalized weakness, hurting all over,  sharp abdominal pain, nausea and vomiting episodes x5. No hematemesis.  Reports that the pain is in her muscles and joints. When asked to give me  the three places were the pain is the worst, she responds with arms, legs  and back. She has had some shortness of breath but no cough. She has had  four to five episodes of diarrhea and bloody stools per her report.   REVIEW OF SYSTEMS:  Grossly positive. Despite attempts to pinpoint the exact  nature of her complaints, she has multiple complaints across all body  systems of a generalized nature.   PAST MEDICAL HISTORY:  1.  Bipolar disorder with multiple psychiatric admissions for suicide      attempts.  2.  Seizure disorder of uncertain etiology, reportedly diagnosed      approximately a year ago.  3.  Sickle cell disease.  4.  Marijuana abuse.   The patient denies having had any surgeries.   FAMILY HISTORY:  The patient's father died at age 60 from complications of  sickle cell disease. The patient reports her mother is 92 and is healthy.  She has three sisters, one who has sickle cell trait. No children.   SOCIAL HISTORY:  The patient is single, lives with her grandmother. She is a  Consulting civil engineer at [employer] She admits to marijuana use and rare cigarette  use. Denies any alcohol.   DRUG ALLERGIES:   ASPIRIN causes hives and DILAUDID causes itching.   MEDICATIONS:  1.  Hydrea 2 grams q.h.s.  2.  Zoloft 100 milligrams daily.  3.  Zyprexa 5 milligrams daily.  4.  Dilantin 100 milligrams daily.  5.  Oxycodone 5 milligrams p.r.n.   REVIEW OF SYSTEMS:  As per HPI. Otherwise no dysuria. Last menstrual period  was 10/03/2005 and normal.   PHYSICAL EXAM:  VITAL SIGNS:  Temperature 100.8, pulse 125, respirations 18,  blood pressure 126/75.  GENERAL: Well-developed, well-nourished female in no acute distress.  HEENT: Normocephalic, atraumatic. PERRL. EOMI. Oropharynx is clear. Sclerae  nonicteric.  NECK: Supple, no thyromegaly, no lymphadenopathy, no jugular venous  distension.  CHEST: Lungs clear to auscultation bilaterally. Good air movement.  HEART: Tachycardic rate with a regular rhythm. No murmurs or rubs, or  gallops.  ABDOMEN: Soft, nontender, nondistended with normoactive bowel sounds.  RECTAL: Normal tone. Stool is heme negative.  EXTREMITIES: No clubbing, edema, cyanosis.  SKIN: Warm and dry. No rashes.  NEUROLOGIC: Patient is alert and oriented x3. Neuro exam is nonfocal.   LABORATORY DATA:  CBC showed a white blood cell count 3.7 with an absolute  neutrophil count 2.3. Hemoglobin is 12.5, hematocrit 37.4, platelets 222.  Sodium was 131, potassium 3.8, chloride 98, bicarb 23, BUN 8, creatinine  0.8, glucose 101, calcium 8.90, total protein 7.3, albumin 3.8, AST 21, ALT  10, alkaline phosphatase 103, total bilirubin 1.1. Urinalysis revealed 7-10  white blood cells, few bacteria, negative for nitrites and positive for  leukocyte esterase.   ASSESSMENT/PLAN:  1.  Sickle cell crisis: The patient's symptoms are most consistent with      sickle cell crisis although she says that the symptoms have not been      typical of her crises in the past. We will check a reticulocyte count      and administer IV fluids. We will supplement her with folic acid and      continue her Hydrea.  We will address her pain needs with morphine by PCA      pump and other analgesics such as Motrin p.r.n.  2.  Pyuria: The patient likely has a urinary tract infection. We will send      urine for culture and empirically treat her with Cipro 250 milligrams      b.i.d. x5 days.  3.  Bipolar disorder: We will continue the patient's usual home medications.      We will also check a urine drug screen given her history of marijuana      abuse to make sure there is no component of other substance use.  4.  Prophylaxis: We will initiate DVT and GI prophylaxis.           ______________________________  Hillery Aldo, M.D.     CR/MEDQ  D:  10/19/2005  T:  10/20/2005  Job:  [id number]   cc:   Fanny Dance. Rankins, M.D.  Fax: [phone]

## 2011-04-11 NOTE — Discharge Summary (Signed)
NAMECYBELE, MAULE NO.:  [id number]   MEDICAL RECORD NO.:  [id number]          PATIENT TYPE:  INP   LOCATION:  5018                         FACILITY:  [facility]   PHYSICIAN:  Altha Harm, MDDATE OF BIRTH:  [birth year]   DATE OF ADMISSION:  03/13/2007  DATE OF DISCHARGE:  03/14/2007                               DISCHARGE SUMMARY   FINAL DISCHARGE DIAGNOSES:  1. Nausea and vomiting, resolved.  2. Chronic pain.  3. History of bipolar disorder.  4. History of depression.  5. History of seizure disorder.  6. Pharyngitis.   NEW MEDICATIONS:  1. Methadone 2.5 mg p.o. q.8 hours.  2. Vicodin 5/500 one tab p.o. q.4-6 hours p.r.n. as needed.  3. Amoxicillin 875 mg p.o. b.i.d. x5 days.  4. In addition to the methadone and Vicodin, the patient is also      receiving Cepacol sprays to the throat p.r.n.   CONTINUED MEDICATIONS:  1. Dilantin 100 mg p.o. t.i.d.  2. Hydroxyurea 500 mg p.o. b.i.d.  3. Seroquel 100 mg p.o. b.i.d.  4. Zoloft 50 mg p.o. daily.  5. Zyprexa 50 mg p.o. daily.   CONSULTANTS:  None.   PROCEDURES:  None.   DIAGNOSTIC STUDIES:  Abdominal ultrasound which was normal.   CODE STATUS:  Full code.   PRIMARY CARE PHYSICIAN:  Dr. Luciana Axe at [facility]   ALLERGIES:  ARE TO ASPIRIN.   CHIEF COMPLAINT:  Nausea and vomiting.   HISTORY OF PRESENT ILLNESS:  Please see Dr. Theodis Aguas dictation for the  details of the HPI.   HOSPITAL COURSE:  1. Nausea and vomiting.  The patient presented with nausea and      vomiting and initial workup of abdominal causes was essentially      negative.  The patient was given bowel rest and nausea and vomiting      resolved and she was started on diet and advanced to regular diet.      The patient is tolerating a regular diet without any difficulty.  2. Chronic pain.  This patient presented to the hospital claiming that      she had sickle cell with a hemoglobin SS.  Extensive review of her      records here  at [facility] reveals that an electrophoresis      was done and this patient, in fact, has no hemoglobin S in any of      her electrophoresis study; thus, it is impossible for this patient      to be a sickle cell patient.  When educated about her condition,      the patient became very belligerent and stated that she then had      arthritis.  When asked to give some background history about her      arthritis in terms of diagnosis, the patient then denied that she      had arthritis.  The patient had not been on any long-acting pain      medications at home and claims that she has been on hydromorphone,      however did not  have any hydromorphone.  Being the weekend, we      could not get records from her physician's office.  The patient was      given IV Dilaudid on admission due to her NPO status and this then      switched over to oral medications.  The patient absolutely refused      oral medications and attempted to leave the hospital against      medical advice going to the emergency room reporting that she was      not offered any treatment when, in fact, she had been offered      treatment which she refused.  The patient was brought back to the      floor, consented to stay and continue with treatment.  The patient      was given methadone 2.5 mg q.8 hours and Vicodin for breakthrough      pain.  On the day of discharge, the patient denied any pain except      for some mild throat pain and had tolerated the methadone and      Vicodin without any problems.  My recommendation is that this      patient if she does continue to complain of pain be referred to a      pain clinic for management of chronic pain.  I do believe that the      patient may have a large psychological component to her pain given      her many psychological diagnoses, however I am not in a position to      assess that with any definity.  The patient is being given      methadone and Vicodin for control  of her pain at this point and I      would recommend that Dr. Luciana Axe refers her to a pain clinic.  The      patient states that she is a patient at [facility],      however we were unable to get records for the patient from [facility], as      the clinic is closed on the weekend.  It is unclear as to exactly      what the patient is being seen at [facility] for.  3. Pharyngitis.  The patient complained of pain in her throat, however      the patient had no erythema or exudate in the throat area.  The      patient had been started been on Rocephin by the admitting      physician and will be given a course of amoxicillin empirically for      treatment of a bacterial pharyngitis.  She has no clinical signs of      an infection including fever, elevated white count or enough      difficulty swallowing to prevent her from eating.  4. History of seizure disorder.  The patient was continued on her      Dilantin.  She had no seizure activity while here.  5. Bipolar disorder and depression.  The patient states she is under      the care of a psychiatrist and the patient was continued on her      usual medications and she showed no evidence of a decompensation of      her mental illnesses while hospitalized.   Patient's condition on discharge is stable.  Dietary restrictions are  none.  Physical restrictions none.  FOLLOWUP:  Patient should follow up with her primary care physician, Dr.  Luciana Axe, at [facility] on an as-needed basis and I would recommend that  her records be consolidated from [facility] and from [facility] regarding any blood dyscrasia or hematologic disorder this  patient claims to have so that we can have a comprehensive idea of the  patient's condition, as she appears to be a frequent presenter to the  [facility].      Altha Harm, MD  Electronically Signed    MAM/MEDQ  D:  03/14/2007  T:  03/14/2007  Job:  [id number]

## 2011-04-11 NOTE — H&P (Signed)
[name], [name] NO.:  [id number]   MEDICAL RECORD NO.:  [id number]          PATIENT TYPE:  IPS   LOCATION:  0302                          [facility]ACILITY:  [facility]   PHYSICIAN:  Jeanice Lim, M.D. DATE OF BIRTH:  Oct 20, 1986   DATE OF ADMISSION:  07/03/2005  DATE OF DISCHARGE:                         PSYCHIATRIC ADMISSION ASSESSMENT   A 25 year old single African-American female voluntarily admitted on July 03, 2005.   HISTORY OF PRESENT ILLNESS:  Patient presents with a history of having a  sickle cell crisis.  Patient was a transfer from [facility] after having a  workup of her sickle cell crisis which she states was diagnosed about a year  ago.  The patient while there was experiencing suicidal ideation with plans  but does not want to divulge what her plans are.  She also is reporting  auditory hallucinations telling her to harm herself and harm her roommate.  She has not been sleeping well.  Her appetite has been decreased, reporting  a weight loss of about 20 pounds.  Patient reports that she feels like she  has no reason to live and that no one loves her.   PAST PSYCHIATRIC HISTORY:  Second admission to [facility].  Patient was here in May 2006 after overdose on her roommates  medication.  She is currently sponsored by [facility],  reports a history of bipolar disorder.   SOCIAL HISTORY:  A 25 year old single African-American female with no  children who lives with her best friend.  She has a high school education  and is attending school at [employer] currently taking computer classes.  No legal  issues.  States she has little support from family and is estranged from her  mother who she states is on the streets using drugs, and her sisters.   FAMILY HISTORY:  None.   ALCOHOL AND DRUG HISTORY:  Patient smokes, denies any alcohol.  Reports  using marijuana.   PRIMARY CARE [place] Feighner:  Dr. Barbaraann Barthel at [facility].   MEDICAL PROBLEMS:  Sickle cell disease.  Seizure disorder, reporting grand  mal seizure, states the last one was in February 2006.   MEDICATIONS:  1.  Zoloft 100 mg daily.  2.  Zyprexa 5 mg daily.  3.  Lamictal, states she was on the started pack but stopped due to her      nausea.  4.  Dilantin 100 mg daily.  5.  Protonix 40 mg daily.  6.  Lovenox 40 mg daily.  7.  Vitamins.   DRUG ALLERGIES:  ASPIRIN, reports a history of hives.  DILAUDID, complaints  of itching.   PHYSICAL EXAMINATION:  Patient was assessed at [facility].  She is an  overweight female in no acute distress.  She denied any complaints.  Pulse  96, respirations 16, blood pressure 131/86, weight 191-1/4 pounds.  In May  patient was 183 pounds.  Hemoglobin 12.2.  Dilantin level 2.5 on admission  to the ED.  Urine drug screen positive for marijuana.  Blood sugar was 85.  Liver enzymes were  within normal limits.  Chest x-ray was within normal  limits.   MENTAL STATUS EXAM:  This is a fully alert, cooperative female, little eye  contact.  Speech is soft spoken.  Patient feels depressed.  Affect is quiet,  teary-eyed at times.  Patient's thought process seems to be somatically  focused, endorsing auditory hallucinations and suicidal ideation.  Cognitive  function intact.  Memory is fair.  Judgment and insight are fair.  Average  intelligence.  Concentration is intact.   ADMISSION DIAGNOSES:  AXIS I:  Bipolar disorder __________.  AXIS II:  Deferred.  AXIS III:  Sickle cell crisis.  Seizure disorder, per patient.  AXIS IV:  Other psychosocial problems.  AXIS V:  Current is 30, past year 88.   PLAN:  Contract for safety.  Stabilize mood and thinking.  We will continue  with the Zoloft.  We will add Risperdal for hallucinations, mood instability  and ruminations.  Patient will increase coping skills by attending all  individual and group therapy.  Patient may need individual therapy.  Tentative length of  stay is 4-5 days.       JO/MEDQ  D:  07/04/2005  T:  07/04/2005  Job:  [id number]

## 2011-04-11 NOTE — Discharge Summary (Signed)
[name], [name] NO.:  [id number]   MEDICAL RECORD NO.:  [id number]          PATIENT TYPE:  IPS   LOCATION:  0302                          FACILITY:  [facility]   PHYSICIAN:  Geoffery Lyons, M.D.      DATE OF BIRTH:  August 30, 1986   DATE OF ADMISSION:  07/03/2005  DATE OF DISCHARGE:  07/07/2005                                 DISCHARGE SUMMARY   CHIEF COMPLAINT/HISTORY OF PRESENT ILLNESS:  This was the second admission  to [facility] for this 25 year old African-American female  voluntarily admitted.  She has history of having sickle cell crisis and  transferred from [facility] after having a workup for her sickle cell  disease which she stated was diagnosed over a year ago.  She was  experiencing suicidal ideation with plans but did not want to divulge what  the plans were.  Reporting auditory hallucinations telling her to harm  herself and harm her roommate.  She has been sleeping well.  Decreased  appetite, weight loss of about 20 pounds.  She endorsed that she has no  reason to continue to live as no one loves her.   PAST PSYCHIATRIC HISTORY:  Second time in [facility].  She was  hospitalized in May 2006 after overdosing on her roommate's medication.  Sponsored by [facility].  She has a past history  of bipolar.   ALCOHOL AND DRUG HISTORY:  She denies the use of alcohol, reports using  marijuana on an occasional basis.   MEDICAL HISTORY:  Sickle cell disease.  Seizure disorder with last seizure  February 2006.   MEDICATIONS:  1.  Zoloft 100 mg per day.  2.  Zyprexa 5 mg per day.  3.  Lamictal that was stopped due to nausea.  4.  Dilantin 100 mg per day.  5.  Protonix 40 mg per day.  6.  Lovenox 40 mg per day.   PHYSICAL EXAMINATION:  Performed and failed to show any acute findings.   LABORATORY WORKUP:  CBC hemoglobin 11.6, white blood cell count 4.0.  Blood  chemistries glucose 94.  Urine drug  screen positive for marijuana.  Liver  enzymes were within normal limits.  Chest x-ray within normal limits.   MENTAL STATUS EXAM:  Revealed a fully alert and cooperative female with  little eye contact.  Speech was soft spoken.  Endorsed feeling depressed.  Affect was depressed.  She became teary-eyed at times.  Thought process  seems to be somatically focused.  Endorsing suicidal ruminations.  No  homicidal ideas.  Some possible auditory hallucinations.  Cognition well  preserved.   ADMISSION DIAGNOSES:  AXIS I:  Bipolar disorder.  Marijuana abuse.  AXIS II:  No diagnosis.  AXIS III:  Sickle cell crisis.  Seizure disorder.  AXIS IV:  Moderate.  AXIS V:  Upon admission 30, highest global assessment of function in the  last year 65.   COURSE IN HOSPITAL:  She was admitted and started in individual and group  psychotherapy.  She was given trazodone for sleep.  She was  maintained on  Zoloft 50 mg per day, given Percocet 1-2 every 6 hours as needed for pain.  She was given Dilantin 100 mg per day.  Zoloft was increased to 75, and she  was given some Ambien for sleep.  She was placed on Risperdal that was  increased up to 0.75 mg at night.  She was also given some Cogentin 0.5  twice a day for EPS.  She did endorse that she was having a hard time,  endorsed that she had sickle cell crisis when she was admitted to the  medical unit.  She endorsed being suicidal from multiple events, multiple  stressors, felt no support.  She was going to school, and she was committed  to further her education.  Other than this, she endorsed that she is very  overwhelmed with a sense that she cannot function.  Her mood was depressed.  Affect was depressed.  Thought processes were positive for a sense  hopelessness and helplessness, positive suicide ruminations.  She continued  to endorse the same symptomatology.  She has been placed on Risperdal that  was increased to 0.75 at night.  On July 06, 2005, she  started feeling  better.  She felt that the increase in medication had helped.  She started  sleeping better.  She was denying any racing thoughts or paranoia.  She did  endorse some anxiety, restlessness.  On July 07, 2005 she was in full  contact with reality.  There were no suicidal ideas, no homicidal ideas, no  hallucinations, no delusions.  Endorsed she was feeling so much better, she  was ready to go back to school and would continue outpatient treatment.  She  was going to back to stay with her friend.  She felt that this was a safe  place for her to be.   DISCHARGE DIAGNOSES:  AXIS I:  Bipolar disorder, depressed.  AXIS II:  No diagnosis.  AXIS III:  Sickle cell crisis.  Seizure disorder.  AXIS IV:  Moderate.  AXIS V:  Upon discharge global assessment of function 55-60.   DISCHARGE MEDICATIONS:  1.  Dilantin 100 mg daily.  2.  Zoloft 75 mg per day.  3.  Cogentin 0.5 twice a day.  4.  Trazodone 50 at night for sleep.  5.  Risperdal 0.25 1 in the morning and 3 at bedtime.   FOLLOW UP:  [facility].      Geoffery Lyons, M.D.  Electronically Signed     IL/MEDQ  D:  08/01/2005  T:  08/01/2005  Job:  [id number]

## 2011-04-11 NOTE — H&P (Signed)
NAMEMARGUERETTE, [name] NO.:  [id number]   MEDICAL RECORD NO.:  [id number]          PATIENT TYPE:  INP   LOCATION:  1825                         FACILITY:  [facility]   PHYSICIAN:  [name], M.D.    DATE OF BIRTH:  Dec 01, 1985   DATE OF ADMISSION:  03/13/2007  DATE OF DISCHARGE:                              HISTORY & PHYSICAL   PRIMARY CARE PHYSICIAN:  Dr. Beverley [name], [facility].   CHIEF COMPLAINT:  Nausea, vomiting, sore throat and right upper quadrant  abdominal pain.   HISTORY OF PRESENT ILLNESS:  The patient is a 25 year old woman with a  past medical history significant for somatization disorder, bipolar  disorder, seizure disorder, and sickle cell trait versus disease, who  presents to the emergency department with a chief complaint of  intractable nausea and vomiting.  This is the patient's third visit to  the emergency department in 4 days.  She presented to the emergency  department on March 09, 2007 with diffuse pain.  She was discharged to  home with a diagnosis of phantom pain.  The patient presented again to  the emergency department on March 11, 2007 with a chief complaint of  nausea, vomiting and back pain.  She was treated symptomatically and  discharged to home again.  This morning, she presents to the emergency  department with a chief complaint of nausea and vomiting which started  approximately 2 weeks ago.  Over the past 3-4 days, the nausea and  vomiting have become worse.  Over the past 24 hours, she has vomited at  least 4 times.  She denies coffee-grounds emesis; however, she says that  she has seen a few specks of blood in her emesis.  She has had right  upper quadrant abdominal pain for 2 weeks.  The pain sometimes radiates  to the right lower leg.  The pain is described as an aching pain and  sometimes sharp.  It has been mild to moderate to severe in intensity at  different times.  She has had some constipation, but no  diarrhea.  She  denies bright red blood per rectum and black tarry stools.  She denies  pain with urination, although she says that her urine output has  decreased over the past few days.  She has been unable to keep any  foods/solids down; however, she has been able to drink some fluids.  She  has also had a 2-day history of a sore throat.  She denies any  difficulty swallowing, although she does have some discomfort with  swallowing.   Her review of systems is positive for chronic pain in her upper back,  lower back, and bilateral legs.  Her review of systems is negative for  headache, chest pain, shortness of breath, weight loss, suicidal  ideation.   During the evaluation in the emergency department, the patient is noted  to be hemodynamically stable.  Her lab data are significant for a serum  potassium of 3.4, liver transaminases that are within normal limits, and  a lipase that is within normal limits.  A rapid  strep screen was ordered  by the emergency department physician and was negative.  However, given  the patient's recurrent presentation to the emergency department over  the past few days, she will be admitted for further evaluation and  management.   PAST MEDICAL HISTORY:  1. Somatization disorder.  2. Bipolar disorder with multiple psychiatric admissions for suicidal      ideation and/or suicide attempts.  The patient's last Behavioral      Health hospitalization was in March 2007.  3. Seizure disorder.  The patient states that her last seizure was in      November 2007.  4. Sickle cell trait versus sickle cell disease.  5. The patient is followed by the sickle cell clinic at [facility].  6. Chronic pain.  7. Questionable history of excision of a scalp infection/tumor at 25      years of age.  8. Recently quit marijuana use, approximately 2 months ago.   MEDICATIONS:  1. Hydroxyurea 500 mg b.i.d.  2. Hydromorphone 4 mg b.i.d. to t.i.d. p.r.n.  3. Dilantin 100 mg  t.i.d.  4. Seroquel 100 mg b.i.d.  5. Zoloft 50 mg daily.  6. Phenergan 12.5 mg as needed.   ALLERGIES:  The patient has an allergy to ASPIRIN, which causes hives.   SOCIAL HISTORY:  The patient is single.  [place]  She denies tobacco, alcohol and illicit  drug use.  She stopped smoking marijuana 2 months ago.  She has 3 years  of college.   FAMILY HISTORY:  Her mother is 70 years of age and is actively dying of  brain cancer.  Her father died at 71 years of age secondary to  complications from a gunshot wound; he had sickle cell disease.   PHYSICAL EXAMINATION:  VITAL SIGNS:  Temperature 98.3, blood pressure  110/75, pulse 76, respiratory rate 18, oxygen saturation 98% on room  air.  GENERAL:  The patient is a pleasant 25 year old [place] American woman  who is currently lying in bed in no acute distress.  HEENT:  Head is normocephalic and nontraumatic.  Pupils are equal, round  and reactive to light.  Extraocular movements are intact.  Conjunctivae  are clear.  Sclerae are white.  Oropharynx reveals prominent tonsils  that are mildly to moderately erythematous without any active exudates  or pustular drainage.  Mucous membranes are otherwise mildly dry.  NECK:  Supple.  No adenopathy, no thyromegaly, no bruit, no JVD.  LUNGS:  Clear to auscultation bilaterally.  HEART:  S1 and S2 with a 2/6 systolic murmur.  ABDOMEN:  Positive bowel sounds, although hypoactive.  Abdomen is soft  and non-distended.  Mild to moderate tenderness at the right upper  quadrant without rebound or guarding.  No appreciable  hepatosplenomegaly.  RECTAL AND GU:  Deferred.  EXTREMITIES:  Pedal pulses palpable bilaterally.  No pretibial edema and  no pedal edema.  Mild tenderness over both calf muscles.  BACK:  She also has some tenderness over the lumbosacral spine and the thoracolumbar spine without appreciable surrounding edema, erythema or  spasm.   NEUROLOGIC:  The patient is alert and oriented x3.  Cranial nerves II-  XII are intact.  Strength is 5/5 throughout.  Sensation is intact.  PSYCHOLOGICAL:  The patient is alert and oriented x3.  She is  cooperative.  Her affect is pleasant, although she says that I feel  bad.  Speech is appropriate. No suicidal ideation.  ADMISSION LABORATORY DATA:  Rapid strep screen negative.  WBC 5.9,  hemoglobin 11.6, MCV 89.1, platelets 255,000.  Sodium 136, potassium  3.4, chloride 105, CO2 25, glucose 83, BUN 4, creatinine 0.5, total  bilirubin 0.7, alkaline phosphatase 60, SGOT 15, SGPT 10, total protein  6.5, albumin 3.7, calcium 8.9, lipase 17.  Urinalysis essentially  negative.  Urine hCG negative.   ASSESSMENT:  1. Intractable nausea and vomiting.  The etiology is unknown at this      time, however, consider viral versus bacterial gastroenteritis,      medication induced, gastritis and rule out cholecystitis.  2. Right upper quadrant abdominal pain.  The patient will need to be      evaluated for gallbladder disease.  3. Diffuse musculoskeletal pain.  The patient says that the pain is      not unlike sickle cell pain when she has a crisis.  4. Normocytic anemia.  The patient's hemoglobin is 11.6.  The anemia      is probably secondary to chronic disease (sickle cell trait versus      sickle cell disease).  5. Somatization disorder and bipolar disorder.  The patient is      chronically treated with Seroquel and Zoloft.  6. Seizure disorder.  The patient is chronically treated with      Dilantin.  During a recent emergency department visit, the      patient's Dilantin was found to be subtherapeutic at 2.5.  The      patient says that she takes her Dilantin; however, objectively, she      probably does not take it consistently.  7. Pharyngitis.  The patient's tonsils and posterior pharynx are      erythematous and her tonsils are mildly swollen.  The strep screens      have been negative.   However, given the patient's history of sickle      cell disease versus trait, it is reasonable to start her on empiric      antibiotics.   PLAN:  1. The patient will be admitted for further evaluation and management.  2. We will start empiric Rocephin 1 g daily.  3. We will minimize her oral intake over the next 24-48 hours.  We      will try to give most of her medications IV for now.  4. We will restart Zoloft and Seroquel in 24-48 hours.  5. Pain management with as-needed intravenous Dilaudid and antiemetic      treatment with Phenergan and Zofran as needed.  6. Replete potassium chloride in the IV fluids.  7. For further evaluation, we will check an ultrasound of the abdomen.      [name], M.D.  Electronically Signed     DF/MEDQ  D:  03/13/2007  T:  03/13/2007  Job:  [id number]   cc:   Fanny Dance. Rankins, M.D.

## 2011-04-11 NOTE — Discharge Summary (Signed)
[name], [name]               ACCOUNT NO.:  [id number]   MEDICAL RECORD NO.:  [id number]          PATIENT TYPE:  INP   LOCATION:  1324                         [facility]ACILITY:  [facility]   PHYSICIAN:  Melissa L. Ladona Ridgel, MD  DATE OF BIRTH:  09-Jul-1986   DATE OF ADMISSION:  12/27/2005  DATE OF DISCHARGE:  11/23/2005                                 DISCHARGE SUMMARY   CHIEF COMPLAINT ON ADMISSION:  Muscle pain consistent with possible sickle  crisis as per the patient.   DISCHARGING DIAGNOSES:  1.  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 The patient is      known to have sickle cell trait. Her symptoms were not consistent with a      sickle cell crisis. Laboratory values did not support a sickle cell      crisis in that her reticulocyte count was within normal limits, her LDH      was within normal limits, and she was not significantly anemic. The      patient was initially treated as if this were sickle cell pain crisis.      She was started on hydroxyurea, folate, and intravenously hydrated, but      at this time those medications have been discontinued in favor of a      psychiatric illness.  2.  Bipolar disease with multiple psychiatric admissions for suicide. The      patient is currently suicidal. Evaluation of this patient for      somatization disorder revealed that she currently had thoughts of      suicidal ideation which have been compounded by the fact that her uncle      has just become deceased. The patient is overtly expressing a desire to      hurt herself and therefore she will be comitted for psychiatric care. At      this time the patient's Zoloft has been continued, her Zyprexa has been      continued, and will need to be adjusted as per the hospital who accepts      this patient for psychiatric care.  3.  Seizure disorder. The patient has had no seizures during this      hospitalization. Her last diagnosed seizure was approximately 1 year      ago. Her  Dilantin level was slightly low on this admission suggesting      possible noncompliance. We will continue her Dilantin at 100 mg t.i.d.      and will not make any adjustments for the fact that the level is low      since she is asymptomatic at this time.  4.  Chronic pain. The patient utilizes oxycodone 5 mg p.r.n. for pain at      home. It appears that during this hospital course the patient was      started on a Duragesic patch 50 mcg/hour every 72 hours. In light of the      fact that her pain is likely somatic in origin, I would recommend that      at her discharge that she be  weaned from the Duragesic patch.  5.  Constipation. The patient has been started on standing doses of Colace      and has been provided with Dulcolax. Further medications can be added to      her regime as needed.  6.  Substance abuse. This is reported in the past. I do not have any      evidence at this time that she is currently using any illicit drugs.   MEDICATIONS AT THE TIME OF DISCHARGE:  1.  Zyprexa 5 mg p.o. daily.  2.  Dilantin 100 mg p.o. t.i.d.  3.  Zoloft 50 mg p.o. daily.  4.  She needs to be discharged with a Duragesic patch 50 mcg/hour changed      q.72h. This will need to be weaned as an inpatient.  5.  Colace 100 mg p.o. b.i.d.  6.  Multivitamin once p.o. daily.  7.  Percocet one to two tablets p.o. q.6h. p.r.n. for pain.  8.  Senokot-S one p.o. q.h.s. p.r.n. for constipation.   HISTORY OF PRESENT ILLNESS:  The patient is a 25 year old African-American  female with a known past medical history for bipolar disorder with multiple  admissions for suicidal ideation and attempts, seizure disorder, sickle cell  trait, and polysubstance abuse. The patient presented to the emergency room  with complaint of pain all over consistent with what she felt was a sickle  cell crisis. The patient states that she was discharged from the hospital  October 24, 2005, and admits she ran out of the medications  approximately 3  weeks ago. She states that over the 48 hours prior to admission she had a  generalized feeling of tingling in her legs, aching and throbbing in her  calves, soreness in her chest and arms. It was difficult for her to take a  breath. The patient was admitted to the hospital for further evaluation. She  was continued on her hydroxyurea, her Zoloft, her Zyprexa, her Dilantin, and  oxycodone. Her pain was uncontrolled with the oxycodone. Therefore, a  fentanyl patch was added. Laboratory studies revealed that this is less  likely to be a sickle cell crisis as her reticulocyte count was within  normal limits, her hemoglobin was stable, and there was no evidence for  changes in her LDH. The patient was seen by our sickle cell consultant who  concurred with the fact that this likely is not consistent with a sickle  cell crisis nor was there any need for exchange transfusion.   Because of the extreme nature of the patient's complaints and the  possibility that this was related to her underlying psychiatric illness, a  psychiatric consult was obtained. The patient was noted to have probable  somatization of pain with documented mood disorder, anxiety, and initially  no overt signs of suicidal ideation. On the day of discharge, however, the  patient received bad news that her uncle had passed away and expressed overt  statements regarding being suicidal and wanting to hurt herself. The patient  therefore has been recommended for inpatient psychiatric care under a  commitment.   At this time, therefore, we will concur with the inpatient stay. She is  currently medically cleared for transfer to a behavioral health setting. On  the day of discharge, the patient's vital signs are stable. Temperature is  98.1, blood pressure 118/65, pulse of 88, respirations 16, saturation 94%.  In general, this is a withdrawn African-American female in no acute distress. She is normocephalic,  atraumatic. Pupils equal, round, and  reactive to light, extraocular movements are intact. Mucous membranes are  moist. Neck is supple. There is no JVD, no lymph nodes, no carotid bruits.  Her chest is clear to auscultation. There are no rhonchi, rales, or wheezes.  Cardiovascular is regular rate and rhythm, positive S1 and S2, no S3 or S4.  No murmurs, rubs, or gallops. Abdomen soft, nontender, nondistended, with  positive bowel sounds. Extremities show no clubbing, cyanosis, or edema.   PERTINENT LABORATORY VALUES DURING THE COURSE OF THE HOSPITAL STAY:  The  patient's thyroid studies are within normal limits. A repeat serum  electrophoresis is pending. She did, however, in August show sickle cell  trait on electrophoresis. Her discharging hemoglobin is 11.1 and 32.3. Her  discharging BUN is 11 with creatinine of 0.8, calcium was 9.1. Urine culture  is pending; however, urinalysis was negative. A urine pregnancy was also  negative.   A decision has been made to discontinue the patient's hydroxyurea and folic  acid as she is not actively in sickle cell crisis, nor should she ever have  symptoms consistent with an acute sickle cell crisis unless under extreme  conditions. I concur with this and would recommend that the fentanyl patch  be slowly tapered over the next week. I recommend that she continue her  Dilantin at the current dosages. If she would show seizure activity should  have re-dosing and reloading of her Dilantin. At this time, however, she is  asymptomatic with regard to seizure activity for greater than 1 year and I  feel that there is no need to attempt to obtain a therapeutic level at this  time.   At this time the patient is deemed medically stable for discharge to a  behavioral health setting for further care for her suicidal ideation.      Melissa L. Ladona Ridgel, MD  Electronically Signed     MLT/MEDQ  D:  12/31/2005  T:  12/31/2005  Job:  [id number]   cc:   Dr.  Luciana Axe

## 2011-04-23 ENCOUNTER — Emergency Department (HOSPITAL_COMMUNITY)
Admission: EM | Admit: 2011-04-23 | Discharge: 2011-04-23 | Discharge status: Against Medical Advice | Payer: Self-pay | Attending: Emergency Medicine | Admitting: Emergency Medicine

## 2011-04-23 DIAGNOSIS — M7989 Other specified soft tissue disorders: Secondary | ICD-10-CM | POA: Insufficient documentation

## 2011-04-23 DIAGNOSIS — IMO0001 Reserved for inherently not codable concepts without codable children: Secondary | ICD-10-CM | POA: Insufficient documentation

## 2011-04-23 DIAGNOSIS — N898 Other specified noninflammatory disorders of vagina: Secondary | ICD-10-CM | POA: Insufficient documentation

## 2011-08-13 LAB — URINALYSIS, ROUTINE W REFLEX MICROSCOPIC
Bilirubin Urine: NEGATIVE
Glucose, UA: NEGATIVE
Hgb urine dipstick: NEGATIVE
Ketones, ur: NEGATIVE
Nitrite: NEGATIVE
Protein, ur: NEGATIVE
Specific Gravity, Urine: 1.005
Urobilinogen, UA: 0.2
pH: 7.5

## 2011-08-13 LAB — PREGNANCY, URINE: Preg Test, Ur: NEGATIVE

## 2011-08-15 LAB — CBC
HCT: 32.2 — ABNORMAL LOW
Hemoglobin: 11.1 — ABNORMAL LOW
MCHC: 34.6
MCV: 91.3
Platelets: 195
RBC: 3.53 — ABNORMAL LOW
RDW: 13.2
WBC: 8.2

## 2011-08-15 LAB — POCT PREGNANCY, URINE
Operator id: 251141
Preg Test, Ur: NEGATIVE

## 2011-08-15 LAB — WET PREP, GENITAL
Trich, Wet Prep: NONE SEEN
Yeast Wet Prep HPF POC: NONE SEEN

## 2011-08-15 LAB — URINALYSIS, ROUTINE W REFLEX MICROSCOPIC
Bilirubin Urine: NEGATIVE
Glucose, UA: NEGATIVE
Hgb urine dipstick: NEGATIVE
Ketones, ur: NEGATIVE
Nitrite: NEGATIVE
Protein, ur: NEGATIVE
Specific Gravity, Urine: 1.02
Urobilinogen, UA: 0.2
pH: 7.5

## 2011-08-15 LAB — GC/CHLAMYDIA PROBE AMP, GENITAL
Chlamydia, DNA Probe: NEGATIVE
GC Probe Amp, Genital: NEGATIVE

## 2011-08-26 LAB — URINALYSIS, ROUTINE W REFLEX MICROSCOPIC
Bilirubin Urine: NEGATIVE
Glucose, UA: NEGATIVE
Hgb urine dipstick: NEGATIVE
Ketones, ur: NEGATIVE
Nitrite: NEGATIVE
Protein, ur: NEGATIVE
Specific Gravity, Urine: 1.01
Urobilinogen, UA: 1
pH: 7

## 2011-08-26 LAB — COMPREHENSIVE METABOLIC PANEL
ALT: 10
AST: 15
Albumin: 3.8
Alkaline Phosphatase: 67
BUN: 5 — ABNORMAL LOW
CO2: 28
Calcium: 9.4
Chloride: 108
Creatinine, Ser: 0.61
GFR calc Af Amer: >60
GFR calc non Af Amer: >60
Glucose, Bld: 82
Potassium: 3.8
Sodium: 141
Total Bilirubin: 1.1
Total Protein: 6.3

## 2011-08-26 LAB — DIFFERENTIAL
Basophils Absolute: 0
Basophils Relative: 0
Eosinophils Absolute: 0
Eosinophils Relative: 1
Lymphocytes Relative: 33
Lymphs Abs: 1.7
Monocytes Absolute: 0.3
Monocytes Relative: 6
Neutro Abs: 3.1
Neutrophils Relative %: 61

## 2011-08-26 LAB — CBC
HCT: 37.8
Hemoglobin: 12.5
MCHC: 33.1
MCV: 94.4
Platelets: 206
RBC: 4.01
RDW: 12.6
WBC: 5.1

## 2011-08-26 LAB — POCT PREGNANCY, URINE: Preg Test, Ur: NEGATIVE

## 2012-03-19 ENCOUNTER — Emergency Department (HOSPITAL_COMMUNITY): Payer: Self-pay

## 2012-03-19 ENCOUNTER — Emergency Department (INDEPENDENT_AMBULATORY_CARE_PROVIDER_SITE_OTHER)
Admission: EM | Admit: 2012-03-19 | Discharge: 2012-03-19 | Disposition: A | Discharge status: Nursing Home (Includes ICF) | Payer: Self-pay | Source: Home / Self Care | Attending: Family Medicine | Admitting: Family Medicine

## 2012-03-19 ENCOUNTER — Encounter (HOSPITAL_COMMUNITY): Payer: Self-pay | Admitting: Emergency Medicine

## 2012-03-19 ENCOUNTER — Emergency Department (HOSPITAL_COMMUNITY)
Admission: EM | Admit: 2012-03-19 | Discharge: 2012-03-19 | Disposition: A | Discharge status: Home / Self Care | Payer: Self-pay | Attending: Emergency Medicine | Admitting: Emergency Medicine

## 2012-03-19 DIAGNOSIS — Y9301 Activity, walking, marching and hiking: Secondary | ICD-10-CM | POA: Insufficient documentation

## 2012-03-19 DIAGNOSIS — M25569 Pain in unspecified knee: Secondary | ICD-10-CM | POA: Insufficient documentation

## 2012-03-19 DIAGNOSIS — M545 Low back pain, unspecified: Secondary | ICD-10-CM | POA: Insufficient documentation

## 2012-03-19 DIAGNOSIS — M549 Dorsalgia, unspecified: Secondary | ICD-10-CM

## 2012-03-19 DIAGNOSIS — S8000XA Contusion of unspecified knee, initial encounter: Secondary | ICD-10-CM | POA: Insufficient documentation

## 2012-03-19 DIAGNOSIS — F17200 Nicotine dependence, unspecified, uncomplicated: Secondary | ICD-10-CM | POA: Insufficient documentation

## 2012-03-19 DIAGNOSIS — G8929 Other chronic pain: Secondary | ICD-10-CM

## 2012-03-19 DIAGNOSIS — W19XXXA Unspecified fall, initial encounter: Secondary | ICD-10-CM | POA: Insufficient documentation

## 2012-03-19 DIAGNOSIS — R079 Chest pain, unspecified: Secondary | ICD-10-CM | POA: Insufficient documentation

## 2012-03-19 LAB — COMPREHENSIVE METABOLIC PANEL
ALT: 6 U/L (ref 0–35)
AST: 18 U/L (ref 0–37)
Albumin: 4.3 g/dL (ref 3.5–5.2)
Alkaline Phosphatase: 69 U/L (ref 39–117)
BUN: 10 mg/dL (ref 6–23)
CO2: 25 mEq/L (ref 19–32)
Calcium: 9.4 mg/dL (ref 8.4–10.5)
Chloride: 105 mEq/L (ref 96–112)
Creatinine, Ser: 0.68 mg/dL (ref 0.50–1.10)
GFR calc Af Amer: >90 mL/min (ref >90)
GFR calc non Af Amer: >90 mL/min (ref >90)
Glucose, Bld: 73 mg/dL (ref 70–99)
Potassium: 3.7 mEq/L (ref 3.5–5.1)
Sodium: 141 mEq/L (ref 135–145)
Total Bilirubin: 1 mg/dL (ref 0.3–1.2)
Total Protein: 7.2 g/dL (ref 6.0–8.3)

## 2012-03-19 LAB — CBC
HCT: 36 % (ref 36.0–46.0)
Hemoglobin: 13.2 g/dL (ref 12.0–15.0)
MCH: 31.7 pg (ref 26.0–34.0)
MCHC: 36.7 g/dL — ABNORMAL HIGH (ref 30.0–36.0)
MCV: 86.5 fL (ref 78.0–100.0)
Platelets: 261 10*3/uL (ref 150–400)
RBC: 4.16 MIL/uL (ref 3.87–5.11)
RDW: 12.3 % (ref 11.5–15.5)
WBC: 5.2 10*3/uL (ref 4.0–10.5)

## 2012-03-19 LAB — DIFFERENTIAL
Basophils Absolute: 0 10*3/uL (ref 0.0–0.1)
Basophils Relative: 0 % (ref 0–1)
Eosinophils Absolute: 0 10*3/uL (ref 0.0–0.7)
Eosinophils Relative: 0 % (ref 0–5)
Lymphocytes Relative: 37 % (ref 12–46)
Lymphs Abs: 1.9 10*3/uL (ref 0.7–4.0)
Monocytes Absolute: 0.3 10*3/uL (ref 0.1–1.0)
Monocytes Relative: 6 % (ref 3–12)
Neutro Abs: 2.9 10*3/uL (ref 1.7–7.7)
Neutrophils Relative %: 56 % (ref 43–77)

## 2012-03-19 LAB — URINALYSIS, ROUTINE W REFLEX MICROSCOPIC
Bilirubin Urine: NEGATIVE
Glucose, UA: NEGATIVE mg/dL
Hgb urine dipstick: NEGATIVE
Ketones, ur: NEGATIVE mg/dL
Leukocytes, UA: NEGATIVE
Nitrite: NEGATIVE
Protein, ur: NEGATIVE mg/dL
Specific Gravity, Urine: 1.024 (ref 1.005–1.030)
Urobilinogen, UA: 1 mg/dL (ref 0.0–1.0)
pH: 6 (ref 5.0–8.0)

## 2012-03-19 MED ORDER — OXYCODONE-ACETAMINOPHEN 5-325 MG PO TABS: 2.0000 | ORAL_TABLET | ORAL | Status: AC | PRN

## 2012-03-19 MED ORDER — SODIUM CHLORIDE 0.9 % IV BOLUS (SEPSIS)
500.0000 mL | Freq: Once | INTRAVENOUS | Status: AC
  Administered 2012-03-19: 500 mL via INTRAVENOUS

## 2012-03-19 MED ORDER — ONDANSETRON HCL 4 MG/2ML IJ SOLN
4.0000 mg | Freq: Once | INTRAMUSCULAR | Status: AC
  Administered 2012-03-19: 4 mg via INTRAVENOUS
  Filled 2012-03-19: qty 2

## 2012-03-19 MED ORDER — SODIUM CHLORIDE 0.9 % IV SOLN
INTRAVENOUS | Status: DC
  Administered 2012-03-19: 12:00:00 via INTRAVENOUS

## 2012-03-19 NOTE — ED Notes (Signed)
Patient states chronic middle lower back pain radiating to bilateral lower extremities.  Patient also states has pain left anterior knee. No swelling present. Denies any trauma onset few weeks ago and not improving.  Limited range of motion due to pain.  Currently patient states pain 7/10 achy.  Airway intact bilateral equal chest rise and fall. Seen at Urgent care today sent to ED for evaluation.

## 2012-03-19 NOTE — ED Notes (Addendum)
Pt reports right knee pain. Went to urgent care and [facility]. Pt states right knee has been giving out on her. Reports lower back discomfort. Pt ambulatory with limp.

## 2012-03-19 NOTE — Discharge Instructions (Signed)
Use ice on the sore areas 3 times a day for 3 days.  See the Dr. of your choice for problems.  Use the resource guide to find a Dr. to see about your sickle cell disease.      Back Pain, Adult Low back pain is very common. About 1 in 5 people have back pain.The cause of low back pain is rarely dangerous. The pain often gets better over time.About half of people with a sudden onset of back pain feel better in just 2 weeks. About 8 in 10 people feel better by 6 weeks.  CAUSES Some common causes of back pain include:  Strain of the muscles or ligaments supporting the spine.   Wear and tear (degeneration) of the spinal discs.   Arthritis.   Direct injury to the back.  DIAGNOSIS Most of the time, the direct cause of low back pain is not known.However, back pain can be treated effectively even when the exact cause of the pain is unknown.Answering your caregiver's questions about your overall health and symptoms is one of the most accurate ways to make sure the cause of your pain is not dangerous. If your caregiver needs more information, he or she may order lab work or imaging tests (X-rays or MRIs).However, even if imaging tests show changes in your back, this usually does not require surgery. HOME CARE INSTRUCTIONS For many people, back pain returns.Since low back pain is rarely dangerous, it is often a condition that people can learn to [facility] their own.   Remain active. It is stressful on the back to sit or stand in one place. Do not sit, drive, or stand in one place for more than 30 minutes at a time. Take short walks on level surfaces as soon as pain allows.Try to increase the length of time you walk each day.   Do not stay in bed.Resting more than 1 or 2 days can delay your recovery.   Do not avoid exercise or work.Your body is made to move.It is not dangerous to be active, even though your back may hurt.Your back will likely heal faster if you return to being active  before your pain is gone.   Pay attention to your body when you bend and lift. Many people have less discomfortwhen lifting if they bend their knees, keep the load close to their bodies,and avoid twisting. Often, the most comfortable positions are those that put less stress on your recovering back.   Find a comfortable position to sleep. Use a firm mattress and lie on your side with your knees slightly bent. If you lie on your back, put a pillow under your knees.   Only take over-the-counter or prescription medicines as directed by your caregiver. Over-the-counter medicines to reduce pain and inflammation are often the most helpful.Your caregiver may prescribe muscle relaxant drugs.These medicines help dull your pain so you can more quickly return to your normal activities and healthy exercise.   Put ice on the injured area.   Put ice in a plastic bag.   Place a towel between your skin and the bag.   Leave the ice on for 15 to 20 minutes, 3 to 4 times a day for the first 2 to 3 days. After that, ice and heat may be alternated to reduce pain and spasms.   Ask your caregiver about trying back exercises and gentle massage. This may be of some benefit.   Avoid feeling anxious or stressed.Stress increases muscle tension and can worsen  back pain.It is important to recognize when you are anxious or stressed and learn ways to manage it.Exercise is a great option.  SEEK MEDICAL CARE IF:  You have pain that is not relieved with rest or medicine.   You have pain that does not improve in 1 week.   You have new symptoms.   You are generally not feeling well.  SEEK IMMEDIATE MEDICAL CARE IF:   You have pain that radiates from your back into your legs.   You develop new bowel or bladder control problems.   You have unusual weakness or numbness in your arms or legs.   You develop nausea or vomiting.   You develop abdominal pain.   You feel faint.  Document Released: 11/10/2005  Document Revised: 10/30/2011 Document Reviewed: 03/31/2011 [facility] Patient Information 2012 [place], [place].Contusion A contusion is a deep bruise. Contusions are the result of an injury that caused bleeding under the skin. The contusion may turn blue, purple, or yellow. Minor injuries will give you a painless contusion, but more severe contusions may stay painful and swollen for a few weeks.  CAUSES  A contusion is usually caused by a blow, trauma, or direct force to an area of the body. SYMPTOMS   Swelling and redness of the injured area.   Bruising of the injured area.   Tenderness and soreness of the injured area.   Pain.  DIAGNOSIS  The diagnosis can be made by taking a history and physical exam. An X-ray, CT scan, or MRI may be needed to determine if there were any associated injuries, such as fractures. TREATMENT  Specific treatment will depend on what area of the body was injured. In general, the best treatment for a contusion is resting, icing, elevating, and applying cold compresses to the injured area. Over-the-counter medicines may also be recommended for pain control. Ask your caregiver what the best treatment is for your contusion. HOME CARE INSTRUCTIONS   Put ice on the injured area.   Put ice in a plastic bag.   Place a towel between your skin and the bag.   Leave the ice on for 15 to 20 minutes, 3 to 4 times a day.   Only take over-the-counter or prescription medicines for pain, discomfort, or fever as directed by your caregiver. Your caregiver may recommend avoiding anti-inflammatory medicines (aspirin, ibuprofen, and naproxen) for 48 hours because these medicines may increase bruising.   Rest the injured area.   If possible, elevate the injured area to reduce swelling.  SEEK IMMEDIATE MEDICAL CARE IF:   You have increased bruising or swelling.   You have pain that is getting worse.   Your swelling or pain is not relieved with medicines.  MAKE SURE YOU:    Understand these instructions.   Will watch your condition.   Will get help right away if you are not doing well or get worse.  Document Released: 08/20/2005 Document Revised: 10/30/2011 Document Reviewed: 09/15/2011 [facility] Patient Information 2012 [place], [place].   RESOURCE GUIDE  Dental Problems  Patients with Medicaid: [facility] [phone] [address]                                           [phone] [address] Phone:  [phone]                                                  Phone:  [phone]  If unable to pay or uninsured, contact:  [facility] or [facility]. to become qualified for the adult dental clinic.  Chronic Pain Problems Contact [facility]  [phone] Patients need to be referred by their primary care doctor.  Insufficient Money for Medicine Contact [employer]:  call "211" or [facility] [phone].  No Primary Care Doctor Call [facility]  [phone] Other agencies that provide inexpensive medical care    [facility]  [phone]    [facility]  [phone]    [facility]  [phone]    [facility]  [phone]    [facility]  [phone]    [facility]  [phone]  Psychological Services [facility]  [phone] [facility] Services  [phone] [facility]   [phone] (emergency services [phone])  Substance Abuse Resources [facility]  [phone] [facility] [phone] [facility] [place] [phone] Floydene Flock [phone] Residential & Outpatient Substance Abuse Program  [phone]  Abuse/Neglect [facility] Child Abuse Hotline (315)078-3564 [facility] Child Abuse Hotline (470) 213-4965 (After Hours)  Emergency Shelter [facility] [phone]  Maternity Homes [facility] at the [employer] [facility] [phone] Re[facility] Alert Services [phone]  MRSA Hotline #:   365-645-8959    [facility] Resources  [facility]     [employer]                          [facility] Dept. [address]                        [address]      [address]  Blondell Reveal Phone:  [phone]                                   Phone:  [phone]                 Phone:  [phone]  [facility] Mental Health Phone:  [phone]  [facility] Child Abuse Hotline [phone] [phone] (After Hours)

## 2012-03-19 NOTE — ED Notes (Signed)
PT HERE WITH ONGOING MID BACK PAIN FEELING AS PIN/NEEDLES BUT HAS PROGRESSED TO LEGS CAUSING FALLS IN THE PAST 6 MNTHS.PT STATES SHE HAS FELL NUMEROUS TIMES WITH LEFT LEG BUCKLING ON HER MORE IF GOING UP OR DOWN HILLS.FELL LAST WEEK.STATS SHE WAS DIAG WITH SICKLE CELL IN 2005 AND PRESCRIBED HYDROURIA BUT STOPPED TAKING 2 YRS AGO.CONSTANT PAIN UNRELIEVED BY OTC PAIN MEDS AND HEAT THERAPY

## 2012-03-19 NOTE — ED Provider Notes (Signed)
History     CSN: [id number]  Arrival date & time 03/19/12  1019   First MD Initiated Contact with Patient 03/19/12 1111      Chief Complaint  Patient presents with  . Knee Pain  . Back Pain    (Consider location/radiation/quality/duration/timing/severity/associated sxs/prior treatment) HPI Comments: [name] is a 26 y.o. female the patient was walking this morning, at work, when pain in her left leg, made her fall. The fall, injured her left knee. She has intermittent recurring pain in both legs from hips to the knees, associated with low back pain for many years. She's been told she had sciatica in the past. She's not getting active treatment for back or other medical problems. She's not taking any medications currently. She is able to get up and walk after the fall today, but has persistent, worse than ussual, left knee pain. No remote trauma to the back. No weakness, urinary incontinence, or bowel abnormality. She has sickle cell disease. She has stopped all treatments were sickle cell disease, because she does not like her Dr.  Patient is a 26 y.o. female presenting with knee pain and back pain. The history is provided by the patient.  Knee Pain  Back Pain     Past Medical History  Diagnosis Date  . Sickle cell anemia     No past surgical history on file.  No family history on file.  History  Substance Use Topics  . Smoking status: Current Everyday Smoker  . Smokeless tobacco: Not on file  . Alcohol Use: No    OB History    Grav Para Term Preterm Abortions TAB SAB Ect Mult Living                  Review of Systems  Musculoskeletal: Positive for back pain.  All other systems reviewed and are negative.    Allergies  Aspirin  Home Medications   Current Outpatient Rx  Name Route Sig Dispense Refill  . FEXOFENADINE HCL 180 MG PO TABS Oral Take 180 mg by mouth daily.    . OXYCODONE-ACETAMINOPHEN 5-325 MG PO TABS Oral Take 2 tablets by mouth every 4  (four) hours as needed for pain. 15 tablet 0    BP 126/88  Pulse 79  Temp(Src) 97.9 F (36.6 C) (Oral)  Resp 16  SpO2 99%  LMP 03/14/2012  Physical Exam  Nursing note and vitals reviewed. Constitutional: She is oriented to person, place, and time. She appears well-developed and well-nourished. She appears distressed (uncomfortable).  HENT:  Head: Normocephalic and atraumatic.  Eyes: Conjunctivae and EOM are normal. Pupils are equal, round, and reactive to light.  Neck: Normal range of motion and phonation normal. Neck supple.  Cardiovascular: Normal rate, regular rhythm and intact distal pulses.   Pulmonary/Chest: Effort normal and breath sounds normal. She exhibits no tenderness.  Abdominal: Soft. She exhibits no distension. There is no tenderness. There is no guarding.  Musculoskeletal:       Mild diffuse lumbar tenderness, without deformity. She is able to sit in the bed without severe pain. Left knee is moderately tender to light touch and passive range of motion. There is no associated deformity, swelling, or apparent instability. Examination is limited due to her severe pain in the left knee.   Neurological: She is alert and oriented to person, place, and time. She has normal strength. She exhibits normal muscle tone.  Skin: Skin is warm and dry.  Psychiatric: She has a normal mood  and affect. Her behavior is normal. Judgment and thought content normal.    ED Course  Procedures (including critical care time)  Labs Reviewed  CBC - Abnormal; Notable for the following:    MCHC 36.7 (*)    All other components within normal limits  DIFFERENTIAL  COMPREHENSIVE METABOLIC PANEL  URINALYSIS, ROUTINE W REFLEX MICROSCOPIC   Dg Chest 2 View  03/19/2012  *RADIOLOGY REPORT*  Clinical Data: Chest pain.  CHEST - 2 VIEW  Comparison: Chest x-ray 04/07/2010.  Findings: Lung volumes are normal.  No consolidative airspace disease.  No pleural effusions.  No pneumothorax.  No pulmonary  nodule or mass noted.  Pulmonary vasculature and the cardiomediastinal silhouette are within normal limits.  IMPRESSION: 1. No radiographic evidence of acute cardiopulmonary disease.  Original Report Authenticated By: Florencia Reasons, M.D.   Dg Lumbar Spine Complete  03/19/2012  *RADIOLOGY REPORT*  Clinical Data: Low back pain.  LUMBAR SPINE - COMPLETE 4+ VIEW  Comparison: No priors.  Findings: Multiple views of the lumbar spine demonstrate normal anatomic alignment.  No acute displaced fractures or compression type fractures are noted.  Visualized bowel gas pattern is nonobstructive.  IMPRESSION:  1.  No acute radiographic abnormality of the lumbar spine to account for the patient's symptoms.  Original Report Authenticated By: Florencia Reasons, M.D.   Dg Knee Complete 4 Views Left  03/19/2012  *RADIOLOGY REPORT*  Clinical Data: Left knee pain and swelling.  No history of injury. History of sickle cell disease.  LEFT KNEE - COMPLETE 4+ VIEW  Comparison: No priors.  Findings: AP, multiple views of the left knee demonstrate no acute fracture, subluxation, dislocation, joint or soft tissue abnormality.  IMPRESSION:  1.  No acute radiographic abnormality of the left knee to account for the patient's symptoms.  Original Report Authenticated By: Florencia Reasons, M.D.  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 Two  Times A Day  2) Risperdal 0.25 Mg Tabs (Risperidone) .Marland Kitchen.. 1 By Mouth Every Morning (Per [facility])  3) Xanax 0.5 Mg Tabs (Alprazolam) .... Three Times A Day (Per [facility])  4) Valium 10 Mg Tabs (Diazepam) .... Two Times A Day (Per [facility])  5) Naprosyn 500 Mg Tabs (Naproxen) .... Take 1 Tablet By Mouth Two Times A Day With Food As Needed For Pain.  Allergies (verified):  1) ! Jonne Ply  Past History:  Past Medical History:  Last updated: 08/06/2007  Sickle Cell Trait(pt does not have SCD  Seizure disorder(not confirmed, see Dr.Hicklings' evaluation 7/0:  NARCOTIC ABUSE (ICD-305.90)  MARIJUANA ABUSE (ICD-305.20)  PERSONALITY DISORDER (ICD-301.9)  DISORDER, BIPOLAR NOS (ICD-296.80)  Past Surgical History:  Last updated: 08/06/2007  February 2007: Hospitalized for Bipolar Disorder  End of [facility] note from 12/14/10  1. Contusion, knee   2. Back pain       MDM  She has nonspecific low back pain, and a fall today with isolated left knee injury. Her recurrent low back and bilateral leg pain is nonspecific. I doubt complication from sickle  cell disease, lumbar disc disease, or occult infection   Plan: Home Medications- Percocet; Home Treatments- rest; Recommended follow up- PCP prn        Flint Melter, MD 03/19/12 1416

## 2012-03-19 NOTE — ED Provider Notes (Signed)
History     CSN: [id number]  Arrival date & time 03/19/12  0844   First MD Initiated Contact with Patient 03/19/12 0848      Chief Complaint  Patient presents with  . Back Pain  . Leg Pain    (Consider location/radiation/quality/duration/timing/severity/associated sxs/prior treatment) Patient is a 26 y.o. female presenting with back pain and leg pain. The history is provided by the patient.  Back Pain  This is a chronic problem. Episode onset: months-- years problem with back and left leg pain and giving out, getting worse, also with SCD, no recent care. The problem has been gradually worsening. The pain is associated with falling. The pain is present in the lumbar spine. Associated symptoms include abdominal pain and leg pain.  Leg Pain     Past Medical History  Diagnosis Date  . Sickle cell anemia     History reviewed. No pertinent past surgical history.  No family history on file.  History  Substance Use Topics  . Smoking status: Current Everyday Smoker  . Smokeless tobacco: Not on file  . Alcohol Use: No    OB History    Grav Para Term Preterm Abortions TAB SAB Ect Mult Living                  Review of Systems  Constitutional: Negative.   Gastrointestinal: Positive for abdominal pain.  Musculoskeletal: Positive for back pain.    Allergies  Aspirin  Home Medications  No current outpatient prescriptions on file.  BP 113/75  Pulse 94  Temp(Src) 98 F (36.7 C) (Oral)  Resp 12  SpO2 100%  LMP 03/14/2012  Physical Exam  Nursing note and vitals reviewed. Constitutional: She is oriented to person, place, and time. She appears well-developed and well-nourished. No distress.  HENT:  Head: Normocephalic.  Abdominal: Soft. Bowel sounds are normal. She exhibits no distension and no mass. There is no tenderness. There is no guarding.  Musculoskeletal: She exhibits tenderness.       Legs:      Distal nvt fxn intact, c/o upper lumbar soreness,     Neurological: She is alert and oriented to person, place, and time.  Skin: Skin is warm and dry.    ED Course  Procedures (including critical care time)  Labs Reviewed - No data to display No results found.   1. Back pain, chronic       MDM          Linna Hoff, MD 03/19/12 1001

## 2012-06-06 ENCOUNTER — Observation Stay (HOSPITAL_COMMUNITY)
Admission: EM | Admit: 2012-06-06 | Discharge: 2012-06-08 | Disposition: A | Discharge status: Home / Self Care | Payer: Self-pay | Attending: Internal Medicine | Admitting: Internal Medicine

## 2012-06-06 ENCOUNTER — Emergency Department (HOSPITAL_COMMUNITY): Payer: Self-pay

## 2012-06-06 ENCOUNTER — Encounter (HOSPITAL_COMMUNITY): Payer: Self-pay | Admitting: *Deleted

## 2012-06-06 DIAGNOSIS — F1210 Cannabis abuse, uncomplicated: Secondary | ICD-10-CM

## 2012-06-06 DIAGNOSIS — R112 Nausea with vomiting, unspecified: Secondary | ICD-10-CM | POA: Insufficient documentation

## 2012-06-06 DIAGNOSIS — F319 Bipolar disorder, unspecified: Secondary | ICD-10-CM

## 2012-06-06 DIAGNOSIS — R569 Unspecified convulsions: Secondary | ICD-10-CM

## 2012-06-06 DIAGNOSIS — M549 Dorsalgia, unspecified: Secondary | ICD-10-CM | POA: Insufficient documentation

## 2012-06-06 DIAGNOSIS — R0989 Other specified symptoms and signs involving the circulatory and respiratory systems: Secondary | ICD-10-CM | POA: Insufficient documentation

## 2012-06-06 DIAGNOSIS — F1910 Other psychoactive substance abuse, uncomplicated: Secondary | ICD-10-CM

## 2012-06-06 DIAGNOSIS — R042 Hemoptysis: Secondary | ICD-10-CM

## 2012-06-06 DIAGNOSIS — R1013 Epigastric pain: Secondary | ICD-10-CM | POA: Insufficient documentation

## 2012-06-06 DIAGNOSIS — R079 Chest pain, unspecified: Secondary | ICD-10-CM

## 2012-06-06 DIAGNOSIS — F609 Personality disorder, unspecified: Secondary | ICD-10-CM

## 2012-06-06 DIAGNOSIS — R61 Generalized hyperhidrosis: Secondary | ICD-10-CM | POA: Insufficient documentation

## 2012-06-06 DIAGNOSIS — A088 Other specified intestinal infections: Secondary | ICD-10-CM

## 2012-06-06 DIAGNOSIS — N920 Excessive and frequent menstruation with regular cycle: Secondary | ICD-10-CM

## 2012-06-06 DIAGNOSIS — R109 Unspecified abdominal pain: Secondary | ICD-10-CM | POA: Diagnosis present

## 2012-06-06 DIAGNOSIS — R0602 Shortness of breath: Secondary | ICD-10-CM | POA: Insufficient documentation

## 2012-06-06 DIAGNOSIS — F450 Somatization disorder: Secondary | ICD-10-CM

## 2012-06-06 DIAGNOSIS — R0789 Other chest pain: Principal | ICD-10-CM | POA: Insufficient documentation

## 2012-06-06 DIAGNOSIS — F32A Depression, unspecified: Secondary | ICD-10-CM | POA: Diagnosis present

## 2012-06-06 DIAGNOSIS — G8929 Other chronic pain: Secondary | ICD-10-CM | POA: Diagnosis present

## 2012-06-06 DIAGNOSIS — F329 Major depressive disorder, single episode, unspecified: Secondary | ICD-10-CM | POA: Diagnosis present

## 2012-06-06 DIAGNOSIS — R0609 Other forms of dyspnea: Secondary | ICD-10-CM | POA: Insufficient documentation

## 2012-06-06 DIAGNOSIS — F17200 Nicotine dependence, unspecified, uncomplicated: Secondary | ICD-10-CM | POA: Insufficient documentation

## 2012-06-06 DIAGNOSIS — N6459 Other signs and symptoms in breast: Secondary | ICD-10-CM

## 2012-06-06 DIAGNOSIS — N644 Mastodynia: Secondary | ICD-10-CM

## 2012-06-06 DIAGNOSIS — D571 Sickle-cell disease without crisis: Secondary | ICD-10-CM | POA: Insufficient documentation

## 2012-06-06 LAB — BASIC METABOLIC PANEL
BUN: 11 mg/dL (ref 6–23)
CO2: 24 mEq/L (ref 19–32)
Calcium: 9.9 mg/dL (ref 8.4–10.5)
Chloride: 99 mEq/L (ref 96–112)
Creatinine, Ser: 0.67 mg/dL (ref 0.50–1.10)
GFR calc Af Amer: >90 mL/min (ref >90)
GFR calc non Af Amer: >90 mL/min (ref >90)
Glucose, Bld: 90 mg/dL (ref 70–99)
Potassium: 3.6 mEq/L (ref 3.5–5.1)
Sodium: 137 mEq/L (ref 135–145)

## 2012-06-06 LAB — CBC WITH DIFFERENTIAL/PLATELET
Basophils Absolute: 0 10*3/uL (ref 0.0–0.1)
Basophils Relative: 0 % (ref 0–1)
Eosinophils Absolute: 0 10*3/uL (ref 0.0–0.7)
Eosinophils Relative: 1 % (ref 0–5)
HCT: 40.3 % (ref 36.0–46.0)
Hemoglobin: 14.6 g/dL (ref 12.0–15.0)
Lymphocytes Relative: 36 % (ref 12–46)
Lymphs Abs: 2 10*3/uL (ref 0.7–4.0)
MCH: 31.9 pg (ref 26.0–34.0)
MCHC: 36.2 g/dL — ABNORMAL HIGH (ref 30.0–36.0)
MCV: 88 fL (ref 78.0–100.0)
Monocytes Absolute: 0.5 10*3/uL (ref 0.1–1.0)
Monocytes Relative: 8 % (ref 3–12)
Neutro Abs: 3.2 10*3/uL (ref 1.7–7.7)
Neutrophils Relative %: 56 % (ref 43–77)
Platelets: 292 10*3/uL (ref 150–400)
RBC: 4.58 MIL/uL (ref 3.87–5.11)
RDW: 12.3 % (ref 11.5–15.5)
WBC: 5.8 10*3/uL (ref 4.0–10.5)

## 2012-06-06 LAB — URINALYSIS, ROUTINE W REFLEX MICROSCOPIC
Bilirubin Urine: NEGATIVE
Glucose, UA: NEGATIVE mg/dL
Hgb urine dipstick: NEGATIVE
Ketones, ur: NEGATIVE mg/dL
Leukocytes, UA: NEGATIVE
Nitrite: NEGATIVE
Protein, ur: NEGATIVE mg/dL
Specific Gravity, Urine: 1.007 (ref 1.005–1.030)
Urobilinogen, UA: 0.2 mg/dL (ref 0.0–1.0)
pH: 7 (ref 5.0–8.0)

## 2012-06-06 LAB — HEPATIC FUNCTION PANEL
ALT: 6 U/L (ref 0–35)
AST: 14 U/L (ref 0–37)
Albumin: 3.7 g/dL (ref 3.5–5.2)
Alkaline Phosphatase: 59 U/L (ref 39–117)
Bilirubin, Direct: 0.1 mg/dL (ref 0.0–0.3)
Indirect Bilirubin: 0.6 mg/dL (ref 0.3–0.9)
Total Bilirubin: 0.7 mg/dL (ref 0.3–1.2)
Total Protein: 6.5 g/dL (ref 6.0–8.3)

## 2012-06-06 LAB — RAPID URINE DRUG SCREEN, HOSP PERFORMED
Amphetamines: NOT DETECTED
Barbiturates: NOT DETECTED
Benzodiazepines: NOT DETECTED
Cocaine: NOT DETECTED
Opiates: NOT DETECTED
Tetrahydrocannabinol: POSITIVE — AB

## 2012-06-06 LAB — CARDIAC PANEL(CRET KIN+CKTOT+MB+TROPI)
CK, MB: 1.5 ng/mL (ref 0.3–4.0)
CK, MB: 1.5 ng/mL (ref 0.3–4.0)
Relative Index: 1.4 (ref 0.0–2.5)
Relative Index: 1.4 (ref 0.0–2.5)
Total CK: 111 U/L (ref 7–177)
Total CK: 109 U/L (ref 7–177)
Troponin I: <0.3 ng/mL (ref <0.30)
Troponin I: <0.3 ng/mL (ref <0.30)

## 2012-06-06 LAB — TROPONIN I: Troponin I: <0.3 ng/mL (ref <0.30)

## 2012-06-06 LAB — LIPASE, BLOOD: Lipase: 15 U/L (ref 11–59)

## 2012-06-06 LAB — PREGNANCY, URINE: Preg Test, Ur: NEGATIVE

## 2012-06-06 MED ORDER — OXYCODONE-ACETAMINOPHEN 5-325 MG PO TABS
1.0000 | ORAL_TABLET | Freq: Four times a day (QID) | ORAL | Status: DC | PRN
  Administered 2012-06-06 – 2012-06-07 (×2): 1 via ORAL
  Filled 2012-06-06 (×2): qty 1

## 2012-06-06 MED ORDER — ONDANSETRON HCL 4 MG/2ML IJ SOLN: 4.0000 mg | Freq: Four times a day (QID) | INTRAMUSCULAR | Status: DC | PRN

## 2012-06-06 MED ORDER — SODIUM CHLORIDE 0.9 % IV SOLN
INTRAVENOUS | Status: AC
  Administered 2012-06-06: 17:00:00 via INTRAVENOUS

## 2012-06-06 MED ORDER — ENOXAPARIN SODIUM 30 MG/0.3ML ~~LOC~~ SOLN
30.0000 mg | SUBCUTANEOUS | Status: DC
  Administered 2012-06-06 – 2012-06-07 (×2): 30 mg via SUBCUTANEOUS
  Filled 2012-06-06 (×3): qty 0.3

## 2012-06-06 MED ORDER — OXYCODONE-ACETAMINOPHEN 5-325 MG PO TABS
2.0000 | ORAL_TABLET | Freq: Four times a day (QID) | ORAL | Status: DC | PRN
  Administered 2012-06-07 (×2): 2 via ORAL
  Filled 2012-06-06 (×2): qty 2

## 2012-06-06 MED ORDER — PHENYTOIN SODIUM EXTENDED 100 MG PO CAPS
100.0000 mg | ORAL_CAPSULE | Freq: Three times a day (TID) | ORAL | Status: DC
  Administered 2012-06-07 – 2012-06-08 (×5): 100 mg via ORAL
  Filled 2012-06-06 (×7): qty 1

## 2012-06-06 MED ORDER — IOHEXOL 350 MG/ML SOLN
100.0000 mL | Freq: Once | INTRAVENOUS | Status: AC | PRN
  Administered 2012-06-06: 100 mL via INTRAVENOUS

## 2012-06-06 MED ORDER — MORPHINE SULFATE 4 MG/ML IJ SOLN
4.0000 mg | Freq: Once | INTRAMUSCULAR | Status: AC
  Administered 2012-06-06: 4 mg via INTRAVENOUS
  Filled 2012-06-06: qty 1

## 2012-06-06 MED ORDER — ONDANSETRON HCL 4 MG/2ML IJ SOLN
INTRAMUSCULAR | Status: AC
  Administered 2012-06-06: 4 mg via INTRAVENOUS
  Filled 2012-06-06: qty 2

## 2012-06-06 MED ORDER — MORPHINE SULFATE 2 MG/ML IJ SOLN
2.0000 mg | INTRAMUSCULAR | Status: DC | PRN
  Administered 2012-06-06: 2 mg via INTRAVENOUS
  Filled 2012-06-06: qty 1

## 2012-06-06 MED ORDER — SODIUM CHLORIDE 0.9 % IV SOLN
Freq: Once | INTRAVENOUS | Status: AC
  Administered 2012-06-06: 20 mL/h via INTRAVENOUS

## 2012-06-06 MED ORDER — PANTOPRAZOLE SODIUM 40 MG IV SOLR
40.0000 mg | Freq: Once | INTRAVENOUS | Status: AC
  Administered 2012-06-06: 40 mg via INTRAVENOUS
  Filled 2012-06-06: qty 40

## 2012-06-06 MED ORDER — ONDANSETRON HCL 4 MG/2ML IJ SOLN: 4.0000 mg | Freq: Three times a day (TID) | INTRAMUSCULAR | Status: DC | PRN

## 2012-06-06 MED ORDER — NITROGLYCERIN 0.4 MG SL SUBL: 0.4000 mg | SUBLINGUAL_TABLET | SUBLINGUAL | Status: DC | PRN

## 2012-06-06 MED ORDER — PANTOPRAZOLE SODIUM 40 MG PO TBEC
40.0000 mg | DELAYED_RELEASE_TABLET | Freq: Every day | ORAL | Status: DC
  Administered 2012-06-06 – 2012-06-08 (×3): 40 mg via ORAL
  Filled 2012-06-06 (×3): qty 1

## 2012-06-06 MED ORDER — ACETAMINOPHEN 325 MG PO TABS: 650.0000 mg | ORAL_TABLET | ORAL | Status: DC | PRN

## 2012-06-06 MED ORDER — HYDROXYZINE HCL 50 MG PO TABS
50.0000 mg | ORAL_TABLET | Freq: Three times a day (TID) | ORAL | Status: DC | PRN
  Filled 2012-06-06: qty 1

## 2012-06-06 MED ORDER — PAROXETINE HCL 20 MG PO TABS
20.0000 mg | ORAL_TABLET | Freq: Every day | ORAL | Status: DC
  Administered 2012-06-06 – 2012-06-08 (×3): 20 mg via ORAL
  Filled 2012-06-06 (×3): qty 1

## 2012-06-06 MED ORDER — ONDANSETRON HCL 4 MG/2ML IJ SOLN
4.0000 mg | Freq: Once | INTRAMUSCULAR | Status: AC
  Administered 2012-06-06: 4 mg via INTRAVENOUS

## 2012-06-06 NOTE — ED Notes (Signed)
Dr. Fredderick Phenix in to see patient.

## 2012-06-06 NOTE — ED Provider Notes (Signed)
History     CSN: [id number]  Arrival date & time 06/06/12  1235   First MD Initiated Contact with Patient 06/06/12 1255      Chief Complaint  Patient presents with  . Chest Pain    (Consider location/radiation/quality/duration/timing/severity/associated sxs/prior treatment) Patient is a 26 y.o. female presenting with chest pain. The history is provided by the patient.  Chest Pain The chest pain began 1 - 2 weeks ago. The chest pain is worsening. The quality of the pain is described as sharp. The pain radiates to the upper back. Primary symptoms include shortness of breath and nausea. Pertinent negatives for primary symptoms include no fever, no abdominal pain and no vomiting. Associated symptoms comments: Sharp chest pain at sternum that radiates into back over the last week. Worse today. No cough or fever. She reports SOB and increased pain with breathing. It causes sweating and nausea. She reports a family history of early CAD/MI (Mother at 56) and a sister with valvular disease. She is a smoker. .     Past Medical History  Diagnosis Date  . Sickle cell anemia     History reviewed. No pertinent past surgical history.  History reviewed. No pertinent family history.  History  Substance Use Topics  . Smoking status: Current Everyday Smoker  . Smokeless tobacco: Not on file  . Alcohol Use: No    OB History    Grav Para Term Preterm Abortions TAB SAB Ect Mult Living                  Review of Systems  Constitutional: Negative for fever.  Respiratory: Positive for shortness of breath.   Cardiovascular: Positive for chest pain.  Gastrointestinal: Positive for nausea. Negative for vomiting and abdominal pain.    Allergies  Aspirin  Home Medications   Current Outpatient Rx  Name Route Sig Dispense Refill  . FEXOFENADINE HCL 180 MG PO TABS Oral Take 180 mg by mouth 3 (three) times daily.     Marland Kitchen HYDROXYZINE HCL 50 MG PO TABS Oral Take 50 mg by mouth 3 (three) times  daily as needed. For PTSD.    Marland Kitchen PAROXETINE HCL 20 MG PO TABS Oral Take 20 mg by mouth daily.      BP 128/79  Pulse 69  Temp 98.8 F (37.1 C) (Oral)  Resp 17  Ht 5\' 10"  (1.778 m)  Wt 134 lb (60.782 kg)  BMI 19.23 kg/m2  SpO2 96%  LMP 05/31/2012  Physical Exam  Constitutional: She is oriented to person, place, and time. She appears well-developed and well-nourished.       Appears significantly uncomfortable.   Cardiovascular: Regular rhythm.  Tachycardia present.   No murmur heard. Pulmonary/Chest: Effort normal. She has no wheezes. She has no rales. She exhibits no tenderness.  Abdominal: Soft. There is no tenderness.  Musculoskeletal: She exhibits no edema.  Neurological: She is alert and oriented to person, place, and time.  Skin: Skin is warm and dry.  Psychiatric: She has a normal mood and affect.    ED Course  Procedures (including critical care time)  Labs Reviewed  CBC WITH DIFFERENTIAL - Abnormal; Notable for the following:    MCHC 36.2 (*)     All other components within normal limits  TROPONIN I  BASIC METABOLIC PANEL  URINALYSIS, ROUTINE W REFLEX MICROSCOPIC  PREGNANCY, URINE   Results for orders placed during the hospital encounter of 06/06/12  CBC WITH DIFFERENTIAL      Component Value  Range   WBC 5.8  4.0 - 10.5 K/uL   RBC 4.58  3.87 - 5.11 MIL/uL   Hemoglobin 14.6  12.0 - 15.0 g/dL   HCT 04.5  40.9 - 81.1 %   MCV 88.0  78.0 - 100.0 fL   MCH 31.9  26.0 - 34.0 pg   MCHC 36.2 (*) 30.0 - 36.0 g/dL   RDW 91.4  78.2 - 95.6 %   Platelets 292  150 - 400 K/uL   Neutrophils Relative 56  43 - 77 %   Neutro Abs 3.2  1.7 - 7.7 K/uL   Lymphocytes Relative 36  12 - 46 %   Lymphs Abs 2.0  0.7 - 4.0 K/uL   Monocytes Relative 8  3 - 12 %   Monocytes Absolute 0.5  0.1 - 1.0 K/uL   Eosinophils Relative 1  0 - 5 %   Eosinophils Absolute 0.0  0.0 - 0.7 K/uL   Basophils Relative 0  0 - 1 %   Basophils Absolute 0.0  0.0 - 0.1 K/uL  TROPONIN I      Component Value  Range   Troponin I <0.30  <0.30 ng/mL  BASIC METABOLIC PANEL      Component Value Range   Sodium 137  135 - 145 mEq/L   Potassium 3.6  3.5 - 5.1 mEq/L   Chloride 99  96 - 112 mEq/L   CO2 24  19 - 32 mEq/L   Glucose, Bld 90  70 - 99 mg/dL   BUN 11  6 - 23 mg/dL   Creatinine, Ser 2.13  0.50 - 1.10 mg/dL   Calcium 9.9  8.4 - 08.6 mg/dL   GFR calc non Af Amer >90  >90 mL/min   GFR calc Af Amer >90  >90 mL/min  URINALYSIS, ROUTINE W REFLEX MICROSCOPIC      Component Value Range   Color, Urine YELLOW  YELLOW   APPearance CLEAR  CLEAR   Specific Gravity, Urine 1.007  1.005 - 1.030   pH 7.0  5.0 - 8.0   Glucose, UA NEGATIVE  NEGATIVE mg/dL   Hgb urine dipstick NEGATIVE  NEGATIVE   Bilirubin Urine NEGATIVE  NEGATIVE   Ketones, ur NEGATIVE  NEGATIVE mg/dL   Protein, ur NEGATIVE  NEGATIVE mg/dL   Urobilinogen, UA 0.2  0.0 - 1.0 mg/dL   Nitrite NEGATIVE  NEGATIVE   Leukocytes, UA NEGATIVE  NEGATIVE  PREGNANCY, URINE      Component Value Range   Preg Test, Ur NEGATIVE  NEGATIVE  URINE RAPID DRUG SCREEN (HOSP PERFORMED)      Component Value Range   Opiates NONE DETECTED  NONE DETECTED   Cocaine NONE DETECTED  NONE DETECTED   Benzodiazepines NONE DETECTED  NONE DETECTED   Amphetamines NONE DETECTED  NONE DETECTED   Tetrahydrocannabinol POSITIVE (*) NONE DETECTED   Barbiturates NONE DETECTED  NONE DETECTED   Ct Angio Chest W/cm &/or Wo Cm  06/06/2012  *RADIOLOGY REPORT*  Clinical Data: Chest pain, diaphoresis and vomiting.  CT ANGIOGRAPHY CHEST  Technique:  Multidetector CT imaging of the chest using the standard protocol during bolus administration of intravenous contrast. Multiplanar reconstructed images including MIPs were obtained and reviewed to evaluate the vascular anatomy.  Contrast: OMNIPAQUE IOHEXOL 350 MG/ML SOLN  Comparison: None.  Findings: Pulmonary arteries are adequately opacified.  There is no evidence of pulmonary embolism.  Thoracic aorta is of normal caliber.  The  lungs are clear.  No edema, infiltrate, nodule  or pleural fluid identified.  No enlarged lymph nodes.  Heart size is normal.  No pericardial fluid.  No bony abnormalities.  IMPRESSION: Normal CTA of the chest.  No acute findings.  Original Report Authenticated By: Reola Calkins, M.D.   No results found.   No diagnosis found. 1. Chest pain   MDM  Pain is controlled with medications. Troponin and CTA negative. EKG changes from previous with family history premature CAD, sister with valvular disease. Per Dr. Fredderick Phenix, admit for r/o obs. Admin.        Rodena Medin, PA-C 06/06/12 1608

## 2012-06-06 NOTE — ED Provider Notes (Signed)
Pt seen and evaluated with Mid level.  Presents with substernal CP, radiatiing to back.  +diaphoresis, nausea.  +FH of early heart dz, sister died at age 27 although it sounds like this may be related to valvular dz, mom had MI at age 31.  Pt smokes, has EKG changes.  Will admit for further cardiac r/o.  Rolan Bucco, MD 06/06/12 1504

## 2012-06-06 NOTE — ED Notes (Signed)
Patient is alert and oriented x3.  She is complaining of chest pain that started 5 days ago. She has a strong family history of MI. Currently she rates her pain level 8 of 10 She states her pain is mid sternal with radiation to her back with nausea and diaphoresis

## 2012-06-06 NOTE — ED Notes (Signed)
Pt c/o midsternal chest pain radiating to her back.  Pt is diaphoretic and vomiting.  States strong family hx of cardiac issues with mom, grandmother, and sister all dying from cardiac arrest.  Sister was 27 years old when she died.  Pt's HR is jumping from mid 80s to 155 on the monitor.  Pt given morphine and zofran and is feeling some relief.

## 2012-06-06 NOTE — ED Notes (Signed)
Pt placed back on cardiac monitor.

## 2012-06-06 NOTE — H&P (Signed)
[facility] History and Physical  [name]:[id number] DOB: 1986-10-08 DOA: 06/06/2012  Referring physician: ER PCP: Beverley Fiedler, MD   Chief Complaint: chest pain/abdominal pain/back pain  HPI:  Presents with sharp, tugging chest and epigastric pain.  radiates into back over the last week. Worse today.  Going on for a few weeks Patient worried as she has a family history of early cardiac death.  CP worse while laying flat.  Nothing makes better. +SOB, +n/v- especially after eating.  No fever, no chills.  No sick contacts.       Review of Systems:  All systems reviewed negative unless stated above  Past Medical History  Diagnosis Date  . Sickle cell anemia- supposedly diagnosed in [place] in 2005- Father had sickle cell and mother carried trait    History reviewed. No pertinent past surgical history. Social History:  reports that she has been smoking.  She does not have any smokeless tobacco history on file. She reports that she does not drink alcohol or use illicit drugs.  Allergies  Allergen Reactions  . Aspirin Hives    History reviewed.  family history: mother MI at age 83 died, sister died of "MI" at 36-- reported valve problems  Prior to Admission medications   Medication Sig Start Date End Date Taking? Authorizing Provider  fexofenadine (ALLEGRA) 180 MG tablet Take 180 mg by mouth 3 (three) times daily.    Yes Historical Provider, MD  hydrOXYzine (ATARAX/VISTARIL) 50 MG tablet Take 50 mg by mouth 3 (three) times daily as needed. For PTSD.   Yes Historical Provider, MD  PARoxetine (PAXIL) 20 MG tablet Take 20 mg by mouth daily.   Yes Historical Provider, MD   Physical Exam: Filed Vitals:   06/06/12 1239 06/06/12 1439 06/06/12 1611  BP: 120/72 128/79 129/109  Pulse: 110 69 76  Temp: 98.8 F (37.1 C)  98 F (36.7 C)  TempSrc: Oral  Oral  Resp: 18 17   Height: 5\' 10"  (1.778 m)    Weight: 60.782 kg (134 lb)    SpO2: 99% 96% 100%     Constitutional: She is oriented to person, place, and time. She appears well-developed and well-nourished.  Appears medicated- sluggish  Cardiovascular: Regular rhythm. No murmurs No murmur heard.  Pulmonary/Chest: Effort normal. She has no wheezes. She has no rales. She exhibits no tenderness.  Abdominal: Soft. There is mild epigastric tenderness.  Musculoskeletal: She exhibits no edema.  Neurological: She is alert and oriented to person, place, and time.  Skin: Skin is warm and dry.  Psychiatric: She has a normal mood and affect.    Labs on Admission:  Basic Metabolic Panel:  Lab 06/06/12 8119  NA 137  K 3.6  CL 99  CO2 24  GLUCOSE 90  BUN 11  CREATININE 0.67  CALCIUM 9.9  MG --  PHOS --   Liver Function Tests: No results found for this basename: AST:5,ALT:5,ALKPHOS:5,BILITOT:5,PROT:5,ALBUMIN:5 in the last 168 hours  Lab 06/06/12 1313  LIPASE 15  AMYLASE --   No results found for this basename: AMMONIA:5 in the last 168 hours CBC:  Lab 06/06/12 1313  WBC 5.8  NEUTROABS 3.2  HGB 14.6  HCT 40.3  MCV 88.0  PLT 292   Cardiac Enzymes:  Lab 06/06/12 1312  CKTOTAL --  CKMB --  CKMBINDEX --  TROPONINI <0.30   BNP: No components found with this basename: POCBNP:5 CBG: No results found for this basename: GLUCAP:5 in the last 168 hours  Radiological  Exams on Admission: Ct Angio Chest W/cm &/or Wo Cm  06/06/2012  *RADIOLOGY REPORT*  Clinical Data: Chest pain, diaphoresis and vomiting.  CT ANGIOGRAPHY CHEST  Technique:  Multidetector CT imaging of the chest using the standard protocol during bolus administration of intravenous contrast. Multiplanar reconstructed images including MIPs were obtained and reviewed to evaluate the vascular anatomy.  Contrast: OMNIPAQUE IOHEXOL 350 MG/ML SOLN  Comparison: None.  Findings: Pulmonary arteries are adequately opacified.  There is no evidence of pulmonary embolism.  Thoracic aorta is of normal caliber.  The lungs are  clear.  No edema, infiltrate, nodule or pleural fluid identified.  No enlarged lymph nodes.  Heart size is normal.  No pericardial fluid.  No bony abnormalities.  IMPRESSION: Normal CTA of the chest.  No acute findings.  Original Report Authenticated By: Reola Calkins, M.D.      Assessment/Plan Active Problems:  Abdominal pain  Chest pain   1. Plan to admit to r/o ACS- echo, CE, FLP-- may need cardiology consult, add PPI, lipase negative, check Liver enzymes as well.  CTA negative for PE, no sign of PNA either.  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  Code Status: full Family Communication: at bedside Disposition Plan: home when better  [name] [facility] Pager [phone]  If 8PM-8AM, please contact night-coverage [email or web address] Password [facility] 06/06/2012, 4:53 PM

## 2012-06-07 ENCOUNTER — Observation Stay (HOSPITAL_COMMUNITY): Payer: Self-pay

## 2012-06-07 DIAGNOSIS — R109 Unspecified abdominal pain: Secondary | ICD-10-CM

## 2012-06-07 LAB — COMPREHENSIVE METABOLIC PANEL
ALT: 6 U/L (ref 0–35)
AST: 12 U/L (ref 0–37)
Albumin: 3.5 g/dL (ref 3.5–5.2)
Alkaline Phosphatase: 57 U/L (ref 39–117)
BUN: 7 mg/dL (ref 6–23)
CO2: 27 mEq/L (ref 19–32)
Calcium: 8.6 mg/dL (ref 8.4–10.5)
Chloride: 104 mEq/L (ref 96–112)
Creatinine, Ser: 0.71 mg/dL (ref 0.50–1.10)
GFR calc Af Amer: >90 mL/min (ref >90)
GFR calc non Af Amer: >90 mL/min (ref >90)
Glucose, Bld: 89 mg/dL (ref 70–99)
Potassium: 3.5 mEq/L (ref 3.5–5.1)
Sodium: 137 mEq/L (ref 135–145)
Total Bilirubin: 0.7 mg/dL (ref 0.3–1.2)
Total Protein: 6 g/dL (ref 6.0–8.3)

## 2012-06-07 LAB — LIPID PANEL
Cholesterol: 109 mg/dL (ref 0–200)
HDL: 38 mg/dL — ABNORMAL LOW (ref >39)
LDL Cholesterol: 59 mg/dL (ref 0–99)
Total CHOL/HDL Ratio: 2.9 RATIO
Triglycerides: 61 mg/dL (ref <150)
VLDL: 12 mg/dL (ref 0–40)

## 2012-06-07 LAB — CBC
HCT: 33.5 % — ABNORMAL LOW (ref 36.0–46.0)
Hemoglobin: 12 g/dL (ref 12.0–15.0)
MCH: 31.7 pg (ref 26.0–34.0)
MCHC: 35.8 g/dL (ref 30.0–36.0)
MCV: 88.4 fL (ref 78.0–100.0)
Platelets: 248 10*3/uL (ref 150–400)
RBC: 3.79 MIL/uL — ABNORMAL LOW (ref 3.87–5.11)
RDW: 12.3 % (ref 11.5–15.5)
WBC: 4.4 10*3/uL (ref 4.0–10.5)

## 2012-06-07 LAB — CARDIAC PANEL(CRET KIN+CKTOT+MB+TROPI)
CK, MB: 2.1 ng/mL (ref 0.3–4.0)
Relative Index: 1.8 (ref 0.0–2.5)
Total CK: 120 U/L (ref 7–177)
Troponin I: <0.3 ng/mL (ref <0.30)

## 2012-06-07 LAB — TSH: TSH: 1.195 u[IU]/mL (ref 0.350–4.500)

## 2012-06-07 LAB — PHENYTOIN LEVEL, TOTAL: Phenytoin Lvl: <2.5 ug/mL — ABNORMAL LOW (ref 10.0–20.0)

## 2012-06-07 MED ORDER — CYCLOBENZAPRINE HCL 5 MG PO TABS
5.0000 mg | ORAL_TABLET | Freq: Three times a day (TID) | ORAL | Status: DC | PRN
Start: 2012-06-07 — End: 2012-06-08
  Administered 2012-06-07: 5 mg via ORAL
  Filled 2012-06-07: qty 1

## 2012-06-07 NOTE — Progress Notes (Signed)
PT. HAD A 2.00 SEC. PAUSE, PT. IS ASYPTOMATIC, NOTIFIED DR. VANN NO NEW ORDERS AT THIS TIME. WILL CONTINUE TO MONITOR AND OBSERVE PT.

## 2012-06-07 NOTE — Progress Notes (Signed)
  Echocardiogram 2D Echocardiogram has been performed.  Clarrissa Shimkus 06/07/2012, 1:54 PM

## 2012-06-07 NOTE — Progress Notes (Signed)
[facility] PROGRESS NOTE  [name] ZOX:[id number] DOB: 1986/11/22 DOA: 06/06/2012 PCP: Beverley Fiedler, MD  Assessment/Plan: Active Problems:  Abdominal pain  Chest pain  Depression  1. CP: cardiac enzymes negative, echo ok, pain has resolved 2. Pause on tele, continue to monitor 3. Back pain- prn percocet 4. Abdominal pain- resolved as well  Code Status: full Family Communication: family at bedside Disposition Plan: home when work up complete   [name]  [facility] Pager [phone]. If 8PM-8AM, please contact night-coverage at [email or web address], password [facility] 06/07/2012, 11:11 AM  LOS: 1 day    HPI/Subjective: C/o back pain CP resolved No fever, no chills Says she still has a decreased appetite   Objective: Filed Vitals:   06/06/12 1611 06/06/12 1645 06/06/12 2149 06/07/12 0557  BP: 129/109 127/78 135/77 120/75  Pulse: 76 72 68 67  Temp: 98 F (36.7 C) 98.3 F (36.8 C) 97.8 F (36.6 C) 97.3 F (36.3 C)  TempSrc: Oral Oral Oral Oral  Resp:  18 18 18   Height:      Weight:      SpO2: 100%  98% 99%    Intake/Output Summary (Last 24 hours) at 06/07/12 1111 Last data filed at 06/06/12 1810  Gross per 24 hour  Intake    320 ml  Output      0 ml  Net    320 ml    Exam:   General:  A+Ox3, NAD  Cardiovascular: rrr  Respiratory: clear ant  Abdomen: +BS, soft, NT/ND  SKin: no rashes or lesion  Data Reviewed: Basic Metabolic Panel:  Lab 06/07/12 8295 06/06/12 1313  NA 137 137  K 3.5 3.6  CL 104 99  CO2 27 24  GLUCOSE 89 90  BUN 7 11  CREATININE 0.71 0.67  CALCIUM 8.6 9.9  MG -- --  PHOS -- --   Liver Function Tests:  Lab 06/07/12 0444 06/06/12 1815  AST 12 14  ALT 6 6  ALKPHOS 57 59  BILITOT 0.7 0.7  PROT 6.0 6.5  ALBUMIN 3.5 3.7    Lab 06/06/12 1313  LIPASE 15  AMYLASE --   No results found for this basename: AMMONIA:5 in the last 168 hours CBC:  Lab 06/07/12 0444 06/06/12 1313  WBC 4.4 5.8  NEUTROABS  -- 3.2  HGB 12.0 14.6  HCT 33.5* 40.3  MCV 88.4 88.0  PLT 248 292   Cardiac Enzymes:  Lab 06/07/12 0444 06/06/12 2308 06/06/12 1815 06/06/12 1312  CKTOTAL 120 109 111 --  CKMB 2.1 1.5 1.5 --  CKMBINDEX -- -- -- --  TROPONINI <0.30 <0.30 <0.30 <0.30   BNP (last 3 results) No results found for this basename: PROBNP:3 in the last 8760 hours CBG: No results found for this basename: GLUCAP:5 in the last 168 hours  No results found for this or any previous visit (from the past 240 hour(s)).   Studies: Ct Angio Chest W/cm &/or Wo Cm  06/06/2012  *RADIOLOGY REPORT*  Clinical Data: Chest pain, diaphoresis and vomiting.  CT ANGIOGRAPHY CHEST  Technique:  Multidetector CT imaging of the chest using the standard protocol during bolus administration of intravenous contrast. Multiplanar reconstructed images including MIPs were obtained and reviewed to evaluate the vascular anatomy.  Contrast: OMNIPAQUE IOHEXOL 350 MG/ML SOLN  Comparison: None.  Findings: Pulmonary arteries are adequately opacified.  There is no evidence of pulmonary embolism.  Thoracic aorta is of normal caliber.  The lungs are clear.  No edema, infiltrate, nodule or pleural  fluid identified.  No enlarged lymph nodes.  Heart size is normal.  No pericardial fluid.  No bony abnormalities.  IMPRESSION: Normal CTA of the chest.  No acute findings.  Original Report Authenticated By: Reola Calkins, M.D.    Scheduled Meds:   . sodium chloride   Intravenous Once  . sodium chloride   Intravenous STAT  . enoxaparin (LOVENOX) injection  30 mg Subcutaneous Q24H  .  morphine injection  4 mg Intravenous Once  .  morphine injection  4 mg Intravenous Once  . ondansetron (ZOFRAN) IV  4 mg Intravenous Once  . pantoprazole  40 mg Oral Q1200  . pantoprazole (PROTONIX) IV  40 mg Intravenous Once  . PARoxetine  20 mg Oral Daily  . phenytoin  100 mg Oral TID   Continuous Infusions:   Active Problems:  Abdominal pain  Chest pain   Depression    [name]  [facility] Pager [phone]. If 8PM-8AM, please contact night-coverage at [email or web address], password [facility]

## 2012-06-07 NOTE — Progress Notes (Signed)
   CARE MANAGEMENT NOTE 06/07/2012  Patient:  [name], [name]   Account Number:  [id number]  Date Initiated:  06/07/2012  Documentation initiated by:  Jiles Crocker  Subjective/Objective Assessment:   ADMITTED TO OBSERVATION FOR CHEST/ ABDOMINAL PAIN     Action/Plan:   PCP: Beverley Fiedler, MD  INDEPENDENT PRIOR TO ADMISSION   Anticipated DC Date:  06/07/2012   Anticipated DC Plan:  HOME/SELF CARE  In-house referral  Financial Counselor      DC Planning Services  CM consult                Status of service:  In process, will continue to follow Medicare Important Message given?  NA - LOS <3 / Initial given by admissions (If response is "NO", the following Medicare IM given date fields will be blank)  Per UR Regulation:  Reviewed for med. necessity/level of care/duration of stay  Comments:  06/07/2012- B Raliegh Scobie RN, BSN, MHA

## 2012-06-07 NOTE — ED Provider Notes (Signed)
Medical screening examination/treatment/procedure(s) were conducted as a shared visit with non-physician practitioner(s) and myself.  I personally evaluated the patient during the encounter   Rolan Bucco, MD 06/07/12 [phone]

## 2012-06-08 MED ORDER — CYCLOBENZAPRINE HCL 5 MG PO TABS: 5.0000 mg | ORAL_TABLET | Freq: Three times a day (TID) | ORAL | Status: AC | PRN

## 2012-06-08 MED ORDER — OXYCODONE-ACETAMINOPHEN 5-325 MG PO TABS: 2.0000 | ORAL_TABLET | Freq: Four times a day (QID) | ORAL | Status: AC | PRN

## 2012-06-08 NOTE — Discharge Summary (Signed)
Physician Discharge Summary  [name] ZOX:[id number] DOB: Aug 16, 1986 DOA: 06/06/2012  PCP: Beverley Fiedler, MD  Admit date: 06/06/2012 Discharge date: 06/08/2012  Discharge Diagnoses:  Active Problems:  Abdominal pain  Chest pain  Depression   Discharge Condition: improved  Diet recommendation: cardiac  History of present illness:  resents with sharp, tugging chest and epigastric pain. radiates into back over the last week. Worse today. Going on for a few weeks  Patient worried as she has a family history of early cardiac death. CP worse while laying flat. Nothing makes better. +SOB, +n/v- especially after eating. No fever, no chills. No sick contacts.    Hospital Course:   Came in with multiple areas of pain- Cardiac pain ruled out with echo and CE and tele.  Back pain- given thoracic x -ray , improved with heating pad, flexeril and pain medications.     Discharge Exam: Filed Vitals:   06/08/12 0544  BP: 110/71  Pulse: 66  Temp: 97.9 F (36.6 C)  Resp: 18   Filed Vitals:   06/07/12 1112 06/07/12 1329 06/07/12 2102 06/08/12 0544  BP: 126/83 138/88 138/84 110/71  Pulse:  67 63 66  Temp:  98.5 F (36.9 C) 98.1 F (36.7 C) 97.9 F (36.6 C)  TempSrc:  Oral Oral Oral  Resp:  18 18 18   Height:      Weight:      SpO2:  99% 99% 97%   General: A+Ox3, NAD Cardiovascular: rrr Respiratory: clear, no wheezing Skin: no rashes or lesions  Discharge Instructions   Medication List  As of 06/08/2012  8:55 AM   ASK your doctor about these medications         fexofenadine 180 MG tablet   Commonly known as: ALLEGRA   Take 180 mg by mouth 3 (three) times daily.      hydrOXYzine 50 MG tablet   Commonly known as: ATARAX/VISTARIL   Take 50 mg by mouth 3 (three) times daily as needed. For PTSD.      PARoxetine 20 MG tablet   Commonly known as: PAXIL   Take 20 mg by mouth daily.      phenytoin 100 MG ER capsule   Commonly known as: DILANTIN   Take 100 mg by mouth 3  (three) times daily.              The results of significant diagnostics from this hospitalization (including imaging, microbiology, ancillary and laboratory) are listed below for reference.    Significant Diagnostic Studies: Dg Thoracic Spine 2 View  06/07/2012  *RADIOLOGY REPORT*  Clinical Data: Upper back pain. Sickle cell disease.  THORACIC SPINE - 2 VIEW  Comparison: Chest CT dated 06/06/2012  Findings: There is no fracture, subluxation, disc space narrowing, or other abnormality.  IMPRESSION: Normal thoracic spine.  Original Report Authenticated By: Gwynn Burly, M.D.   Ct Angio Chest W/cm &/or Wo Cm  06/06/2012  *RADIOLOGY REPORT*  Clinical Data: Chest pain, diaphoresis and vomiting.  CT ANGIOGRAPHY CHEST  Technique:  Multidetector CT imaging of the chest using the standard protocol during bolus administration of intravenous contrast. Multiplanar reconstructed images including MIPs were obtained and reviewed to evaluate the vascular anatomy.  Contrast: OMNIPAQUE IOHEXOL 350 MG/ML SOLN  Comparison: None.  Findings: Pulmonary arteries are adequately opacified.  There is no evidence of pulmonary embolism.  Thoracic aorta is of normal caliber.  The lungs are clear.  No edema, infiltrate, nodule or pleural fluid identified.  No enlarged lymph  nodes.  Heart size is normal.  No pericardial fluid.  No bony abnormalities.  IMPRESSION: Normal CTA of the chest.  No acute findings.  Original Report Authenticated By: Reola Calkins, M.D.    Microbiology: No results found for this or any previous visit (from the past 240 hour(s)).   Labs: Basic Metabolic Panel:  Lab 06/07/12 1610 06/06/12 1313  NA 137 137  K 3.5 3.6  CL 104 99  CO2 27 24  GLUCOSE 89 90  BUN 7 11  CREATININE 0.71 0.67  CALCIUM 8.6 9.9  MG -- --  PHOS -- --   Liver Function Tests:  Lab 06/07/12 0444 06/06/12 1815  AST 12 14  ALT 6 6  ALKPHOS 57 59  BILITOT 0.7 0.7  PROT 6.0 6.5  ALBUMIN 3.5 3.7     Lab 06/06/12 1313  LIPASE 15  AMYLASE --   No results found for this basename: AMMONIA:5 in the last 168 hours CBC:  Lab 06/07/12 0444 06/06/12 1313  WBC 4.4 5.8  NEUTROABS -- 3.2  HGB 12.0 14.6  HCT 33.5* 40.3  MCV 88.4 88.0  PLT 248 292   Cardiac Enzymes:  Lab 06/07/12 0444 06/06/12 2308 06/06/12 1815 06/06/12 1312  CKTOTAL 120 109 111 --  CKMB 2.1 1.5 1.5 --  CKMBINDEX -- -- -- --  TROPONINI <0.30 <0.30 <0.30 <0.30   BNP: BNP (last 3 results) No results found for this basename: PROBNP:3 in the last 8760 hours CBG: No results found for this basename: GLUCAP:5 in the last 168 hours  Time coordinating discharge:45 min  Signed:  Benjamine Mola, Erandy Mceachern  [facility] 06/08/2012, 8:55 AM

## 2013-02-07 ENCOUNTER — Emergency Department (HOSPITAL_COMMUNITY): Payer: No Typology Code available for payment source

## 2013-02-07 ENCOUNTER — Emergency Department (HOSPITAL_COMMUNITY)
Admission: EM | Admit: 2013-02-07 | Discharge: 2013-02-07 | Disposition: A | Discharge status: Home / Self Care | Payer: No Typology Code available for payment source | Attending: Emergency Medicine | Admitting: Emergency Medicine

## 2013-02-07 ENCOUNTER — Encounter (HOSPITAL_COMMUNITY): Payer: Self-pay | Admitting: Emergency Medicine

## 2013-02-07 DIAGNOSIS — F3289 Other specified depressive episodes: Secondary | ICD-10-CM | POA: Insufficient documentation

## 2013-02-07 DIAGNOSIS — S298XXA Other specified injuries of thorax, initial encounter: Secondary | ICD-10-CM | POA: Insufficient documentation

## 2013-02-07 DIAGNOSIS — Y9389 Activity, other specified: Secondary | ICD-10-CM | POA: Insufficient documentation

## 2013-02-07 DIAGNOSIS — S8000XA Contusion of unspecified knee, initial encounter: Secondary | ICD-10-CM | POA: Insufficient documentation

## 2013-02-07 DIAGNOSIS — F329 Major depressive disorder, single episode, unspecified: Secondary | ICD-10-CM | POA: Insufficient documentation

## 2013-02-07 DIAGNOSIS — Z79899 Other long term (current) drug therapy: Secondary | ICD-10-CM | POA: Insufficient documentation

## 2013-02-07 DIAGNOSIS — S8002XA Contusion of left knee, initial encounter: Secondary | ICD-10-CM

## 2013-02-07 DIAGNOSIS — F17200 Nicotine dependence, unspecified, uncomplicated: Secondary | ICD-10-CM | POA: Insufficient documentation

## 2013-02-07 DIAGNOSIS — S43499A Other sprain of unspecified shoulder joint, initial encounter: Secondary | ICD-10-CM | POA: Insufficient documentation

## 2013-02-07 DIAGNOSIS — S46819A Strain of other muscles, fascia and tendons at shoulder and upper arm level, unspecified arm, initial encounter: Secondary | ICD-10-CM

## 2013-02-07 DIAGNOSIS — S0993XA Unspecified injury of face, initial encounter: Secondary | ICD-10-CM | POA: Insufficient documentation

## 2013-02-07 DIAGNOSIS — G40909 Epilepsy, unspecified, not intractable, without status epilepticus: Secondary | ICD-10-CM | POA: Insufficient documentation

## 2013-02-07 DIAGNOSIS — S199XXA Unspecified injury of neck, initial encounter: Secondary | ICD-10-CM | POA: Insufficient documentation

## 2013-02-07 DIAGNOSIS — Y92410 Unspecified street and highway as the place of occurrence of the external cause: Secondary | ICD-10-CM | POA: Insufficient documentation

## 2013-02-07 DIAGNOSIS — Z862 Personal history of diseases of the blood and blood-forming organs and certain disorders involving the immune mechanism: Secondary | ICD-10-CM | POA: Insufficient documentation

## 2013-02-07 HISTORY — DX: Unspecified convulsions: R56.9

## 2013-02-07 HISTORY — DX: Major depressive disorder, single episode, unspecified: F32.9

## 2013-02-07 HISTORY — DX: Depression, unspecified: F32.A

## 2013-02-07 MED ORDER — IBUPROFEN 800 MG PO TABS: 800.0000 mg | ORAL_TABLET | Freq: Three times a day (TID) | ORAL | Status: DC | PRN

## 2013-02-07 MED ORDER — HYDROCODONE-ACETAMINOPHEN 5-325 MG PO TABS
1.0000 | ORAL_TABLET | Freq: Once | ORAL | Status: AC
  Administered 2013-02-07: 1 via ORAL
  Filled 2013-02-07: qty 1

## 2013-02-07 MED ORDER — METHOCARBAMOL 500 MG PO TABS: 500.0000 mg | ORAL_TABLET | Freq: Two times a day (BID) | ORAL | Status: DC
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
## 2013-02-07 NOTE — ED Notes (Signed)
Pt placed in medium philly c-collar

## 2013-02-08 NOTE — ED Provider Notes (Signed)
Medical screening examination/treatment/procedure(s) were performed by non-physician practitioner and as supervising physician I was immediately available for consultation/collaboration.  Flint Melter, MD 02/08/13 [phone]

## 2013-03-23 ENCOUNTER — Emergency Department (HOSPITAL_COMMUNITY): Payer: No Typology Code available for payment source

## 2013-03-23 ENCOUNTER — Emergency Department (HOSPITAL_COMMUNITY)
Admission: EM | Admit: 2013-03-23 | Discharge: 2013-03-23 | Disposition: A | Discharge status: Home / Self Care | Payer: No Typology Code available for payment source | Attending: Emergency Medicine | Admitting: Emergency Medicine

## 2013-03-23 ENCOUNTER — Encounter (HOSPITAL_COMMUNITY): Payer: Self-pay | Admitting: Emergency Medicine

## 2013-03-23 DIAGNOSIS — M545 Low back pain, unspecified: Secondary | ICD-10-CM | POA: Insufficient documentation

## 2013-03-23 DIAGNOSIS — G40909 Epilepsy, unspecified, not intractable, without status epilepticus: Secondary | ICD-10-CM | POA: Insufficient documentation

## 2013-03-23 DIAGNOSIS — Z862 Personal history of diseases of the blood and blood-forming organs and certain disorders involving the immune mechanism: Secondary | ICD-10-CM | POA: Insufficient documentation

## 2013-03-23 DIAGNOSIS — R300 Dysuria: Secondary | ICD-10-CM | POA: Insufficient documentation

## 2013-03-23 DIAGNOSIS — R3915 Urgency of urination: Secondary | ICD-10-CM | POA: Insufficient documentation

## 2013-03-23 DIAGNOSIS — F3289 Other specified depressive episodes: Secondary | ICD-10-CM | POA: Insufficient documentation

## 2013-03-23 DIAGNOSIS — R209 Unspecified disturbances of skin sensation: Secondary | ICD-10-CM | POA: Insufficient documentation

## 2013-03-23 DIAGNOSIS — R32 Unspecified urinary incontinence: Secondary | ICD-10-CM | POA: Insufficient documentation

## 2013-03-23 DIAGNOSIS — M549 Dorsalgia, unspecified: Secondary | ICD-10-CM

## 2013-03-23 DIAGNOSIS — R110 Nausea: Secondary | ICD-10-CM | POA: Insufficient documentation

## 2013-03-23 DIAGNOSIS — Z79899 Other long term (current) drug therapy: Secondary | ICD-10-CM | POA: Insufficient documentation

## 2013-03-23 DIAGNOSIS — F329 Major depressive disorder, single episode, unspecified: Secondary | ICD-10-CM | POA: Insufficient documentation

## 2013-03-23 DIAGNOSIS — G43909 Migraine, unspecified, not intractable, without status migrainosus: Secondary | ICD-10-CM | POA: Insufficient documentation

## 2013-03-23 DIAGNOSIS — F17200 Nicotine dependence, unspecified, uncomplicated: Secondary | ICD-10-CM | POA: Insufficient documentation

## 2013-03-23 DIAGNOSIS — G8911 Acute pain due to trauma: Secondary | ICD-10-CM | POA: Insufficient documentation

## 2013-03-23 MED ORDER — CYCLOBENZAPRINE HCL 5 MG PO TABS: 5.0000 mg | ORAL_TABLET | Freq: Three times a day (TID) | ORAL | Status: DC | PRN

## 2013-03-23 MED ORDER — DIPHENHYDRAMINE HCL 50 MG/ML IJ SOLN
25.0000 mg | Freq: Once | INTRAMUSCULAR | Status: AC
  Administered 2013-03-23: 25 mg via INTRAVENOUS
  Filled 2013-03-23: qty 1

## 2013-03-23 MED ORDER — ACETAMINOPHEN 500 MG PO TABS
1000.0000 mg | ORAL_TABLET | Freq: Once | ORAL | Status: AC
  Administered 2013-03-23: 1000 mg via ORAL
  Filled 2013-03-23: qty 2

## 2013-03-23 MED ORDER — SODIUM CHLORIDE 0.9 % IV SOLN
Freq: Once | INTRAVENOUS | Status: AC
  Administered 2013-03-23: 12:00:00 via INTRAVENOUS

## 2013-03-23 MED ORDER — DIPHENHYDRAMINE HCL 50 MG/ML IJ SOLN: 25.0000 mg | Freq: Once | INTRAMUSCULAR | Status: DC

## 2013-03-23 MED ORDER — METOCLOPRAMIDE HCL 5 MG/ML IJ SOLN
10.0000 mg | INTRAMUSCULAR | Status: AC
  Administered 2013-03-23: 10 mg via INTRAVENOUS
  Filled 2013-03-23: qty 2

## 2013-03-23 NOTE — ED Notes (Signed)
Complaints of pain. PA informed.

## 2013-03-23 NOTE — ED Notes (Signed)
States that she has a migraine. States that her left toes are numb and have been since Saturday. States that she is having pain in her left leg. States that she feels as though is going to give out on her as she is walking. States that she has pain with palpation of her calf. Circumference of calf are equal bilaterally. Pulses are weak but palpable in the left foot, good color, and extremities are warm.

## 2013-03-23 NOTE — ED Provider Notes (Signed)
The patient's lumbar spine is unremarkable. The MRI was normal. Patient does not appear to have cauda equina or any acute neurologic abnormality and the lumbar spine.  Patient will be discharged home. Recommend followup with a chiropractor.  Celene Kras, MD 03/23/13 [phone]

## 2013-03-23 NOTE — ED Notes (Signed)
Pt returned from MRI °

## 2013-03-23 NOTE — ED Notes (Signed)
Patient transported to MRI 
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
## 2013-03-25 NOTE — ED Provider Notes (Signed)
Medical screening examination/treatment/procedure(s) were conducted as a shared visit with non-physician practitioner(s) and myself.  I personally evaluated the patient during the encounter   Celene Kras, MD 03/25/13 [phone]

## 2013-11-11 ENCOUNTER — Emergency Department (HOSPITAL_COMMUNITY)
Admission: EM | Admit: 2013-11-11 | Discharge: 2013-11-11 | Disposition: A | Discharge status: Home / Self Care | Payer: Medicaid Other | Attending: Emergency Medicine | Admitting: Emergency Medicine

## 2013-11-11 ENCOUNTER — Emergency Department (HOSPITAL_COMMUNITY): Payer: Medicaid Other

## 2013-11-11 ENCOUNTER — Encounter (HOSPITAL_COMMUNITY): Payer: Self-pay | Admitting: Emergency Medicine

## 2013-11-11 DIAGNOSIS — F411 Generalized anxiety disorder: Secondary | ICD-10-CM | POA: Insufficient documentation

## 2013-11-11 DIAGNOSIS — G8929 Other chronic pain: Secondary | ICD-10-CM | POA: Insufficient documentation

## 2013-11-11 DIAGNOSIS — R1013 Epigastric pain: Secondary | ICD-10-CM | POA: Insufficient documentation

## 2013-11-11 DIAGNOSIS — R5381 Other malaise: Secondary | ICD-10-CM | POA: Insufficient documentation

## 2013-11-11 DIAGNOSIS — D571 Sickle-cell disease without crisis: Secondary | ICD-10-CM | POA: Insufficient documentation

## 2013-11-11 DIAGNOSIS — F17200 Nicotine dependence, unspecified, uncomplicated: Secondary | ICD-10-CM | POA: Insufficient documentation

## 2013-11-11 DIAGNOSIS — G40909 Epilepsy, unspecified, not intractable, without status epilepticus: Secondary | ICD-10-CM | POA: Insufficient documentation

## 2013-11-11 DIAGNOSIS — M549 Dorsalgia, unspecified: Secondary | ICD-10-CM

## 2013-11-11 DIAGNOSIS — Z79899 Other long term (current) drug therapy: Secondary | ICD-10-CM | POA: Insufficient documentation

## 2013-11-11 DIAGNOSIS — R630 Anorexia: Secondary | ICD-10-CM | POA: Insufficient documentation

## 2013-11-11 DIAGNOSIS — R51 Headache: Secondary | ICD-10-CM | POA: Insufficient documentation

## 2013-11-11 DIAGNOSIS — Z3202 Encounter for pregnancy test, result negative: Secondary | ICD-10-CM | POA: Insufficient documentation

## 2013-11-11 DIAGNOSIS — R1310 Dysphagia, unspecified: Secondary | ICD-10-CM | POA: Insufficient documentation

## 2013-11-11 DIAGNOSIS — R05 Cough: Secondary | ICD-10-CM | POA: Insufficient documentation

## 2013-11-11 DIAGNOSIS — R079 Chest pain, unspecified: Secondary | ICD-10-CM

## 2013-11-11 DIAGNOSIS — R000 Tachycardia, unspecified: Secondary | ICD-10-CM | POA: Insufficient documentation

## 2013-11-11 DIAGNOSIS — R42 Dizziness and giddiness: Secondary | ICD-10-CM | POA: Insufficient documentation

## 2013-11-11 DIAGNOSIS — Z8249 Family history of ischemic heart disease and other diseases of the circulatory system: Secondary | ICD-10-CM | POA: Insufficient documentation

## 2013-11-11 DIAGNOSIS — R059 Cough, unspecified: Secondary | ICD-10-CM | POA: Insufficient documentation

## 2013-11-11 DIAGNOSIS — R0789 Other chest pain: Secondary | ICD-10-CM | POA: Insufficient documentation

## 2013-11-11 LAB — COMPREHENSIVE METABOLIC PANEL
ALT: 8 U/L (ref 0–35)
AST: 16 U/L (ref 0–37)
Albumin: 4.2 g/dL (ref 3.5–5.2)
Alkaline Phosphatase: 67 U/L (ref 39–117)
BUN: 11 mg/dL (ref 6–23)
CO2: 25 mEq/L (ref 19–32)
Calcium: 9.2 mg/dL (ref 8.4–10.5)
Chloride: 102 mEq/L (ref 96–112)
Creatinine, Ser: 0.71 mg/dL (ref 0.50–1.10)
GFR calc Af Amer: >90 mL/min (ref >90)
GFR calc non Af Amer: >90 mL/min (ref >90)
Glucose, Bld: 79 mg/dL (ref 70–99)
Potassium: 3.6 mEq/L (ref 3.5–5.1)
Sodium: 138 mEq/L (ref 135–145)
Total Bilirubin: 1.3 mg/dL — ABNORMAL HIGH (ref 0.3–1.2)
Total Protein: 7.3 g/dL (ref 6.0–8.3)

## 2013-11-11 LAB — CBC
HCT: 35.8 % — ABNORMAL LOW (ref 36.0–46.0)
Hemoglobin: 12.9 g/dL (ref 12.0–15.0)
MCH: 31.5 pg (ref 26.0–34.0)
MCHC: 36 g/dL (ref 30.0–36.0)
MCV: 87.5 fL (ref 78.0–100.0)
Platelets: 232 10*3/uL (ref 150–400)
RBC: 4.09 MIL/uL (ref 3.87–5.11)
RDW: 11.9 % (ref 11.5–15.5)
WBC: 4.2 10*3/uL (ref 4.0–10.5)

## 2013-11-11 LAB — POCT PREGNANCY, URINE: Preg Test, Ur: NEGATIVE

## 2013-11-11 LAB — D-DIMER, QUANTITATIVE: D-Dimer, Quant: 0.3 ug/mL-FEU (ref 0.00–0.48)

## 2013-11-11 LAB — POCT I-STAT TROPONIN I: Troponin i, poc: 0 ng/mL (ref 0.00–0.08)

## 2013-11-11 LAB — PHENYTOIN LEVEL, TOTAL: Phenytoin Lvl: <2.5 ug/mL — ABNORMAL LOW (ref 10.0–20.0)

## 2013-11-11 LAB — LIPASE, BLOOD: Lipase: 14 U/L (ref 11–59)

## 2013-11-11 MED ORDER — ONDANSETRON HCL 4 MG PO TABS: 4.0000 mg | ORAL_TABLET | Freq: Four times a day (QID) | ORAL | Status: DC

## 2013-11-11 MED ORDER — ONDANSETRON HCL 4 MG/2ML IJ SOLN
4.0000 mg | Freq: Once | INTRAMUSCULAR | Status: AC
  Administered 2013-11-11: 4 mg via INTRAVENOUS
  Filled 2013-11-11: qty 2

## 2013-11-11 MED ORDER — MORPHINE SULFATE 4 MG/ML IJ SOLN
4.0000 mg | Freq: Once | INTRAMUSCULAR | Status: AC
  Administered 2013-11-11: 4 mg via INTRAVENOUS
  Filled 2013-11-11: qty 1

## 2013-11-11 MED ORDER — HYDROCODONE-ACETAMINOPHEN 5-325 MG PO TABS: 1.0000 | ORAL_TABLET | ORAL | Status: DC | PRN

## 2013-11-11 MED ORDER — SODIUM CHLORIDE 0.9 % IV BOLUS (SEPSIS)
1000.0000 mL | Freq: Once | INTRAVENOUS | Status: AC
  Administered 2013-11-11: 1000 mL via INTRAVENOUS

## 2013-11-11 NOTE — Progress Notes (Signed)
[facility] provided pt with a list of primary care resources. Patient stated that she was pending Medicaid.  °

## 2013-11-11 NOTE — ED Notes (Signed)
Pt states she has had cough, chest pain and L shoulder "armpit" pain for last 2 weeks which became worse last night. Pt states she has sob, speaking complete sentences. Pt states she has lightheadedness, nausea and back pain. Pt states she is coughing up dark yellow phlegm. No fevers or sore throat. Pt has hx of sickle cell, but states pain feels different.

## 2013-11-11 NOTE — ED Provider Notes (Signed)
CSN: [id number]     Arrival date & time 11/11/13  7829 History   First MD Initiated Contact with Patient 11/11/13 0930     Chief Complaint  Patient presents with  . Chest Pain  . Cough  . Generalized Body Aches   (Consider location/radiation/quality/duration/timing/severity/associated sxs/prior Treatment) HPI Comments: Patient with history of seizure disorder (though she does not see a neurologist) and questionable history of sickle cell disease presents today with multiple complaints.  She states the main complaint is back pain that has now begun to radiate into her chest.  She states that she has a family history of early cardiac death.  She reports that the pain has been going on intermittently for "months" but that it has been steady for the past 2 weeks.  She reports that the pain is worse with movement, comes from her back, into her chest and then radiates down her left arm.  She reports nausea with several episodes of vomiting, shortness of breath,. Cough with dark yellow sputum, epigastric abdominal pain.  She reports an admission to the hospital about 1 1/2 years ago for same symptoms with a negative work up.  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  Patient is a 27 y.o. female presenting with chest pain and cough. The history is provided by the patient. No language interpreter was used.  Chest Pain Pain location:  L chest Pain quality: pressure, radiating and sharp   Pain radiates to:  L arm Pain radiates to the back: yes   Pain severity:  Severe Onset quality:  Unable to specify Timing:  Intermittent Progression:  Worsening Chronicity:  Chronic Context: breathing, eating, movement and stress   Relieved by:  Nothing Worsened by:  Nothing tried Ineffective treatments:  None tried Associated symptoms: abdominal pain, anorexia, anxiety, back pain, cough, dizziness, fatigue, headache, nausea, shortness of breath,  vomiting and weakness   Associated symptoms: no diaphoresis, no dysphagia, no lower extremity edema, no numbness, no palpitations and no syncope   Cough Associated symptoms: chest pain, headaches and shortness of breath   Associated symptoms: no diaphoresis     Past Medical History  Diagnosis Date  . Sickle cell anemia   . Seizures   . Depression    History reviewed. No pertinent past surgical history. History reviewed. No pertinent family history. History  Substance Use Topics  . Smoking status: Current Every Day Smoker  . Smokeless tobacco: Not on file  . Alcohol Use: No   OB History   Grav Para Term Preterm Abortions TAB SAB Ect Mult Living                 Review of Systems  Constitutional: Positive for fatigue. Negative for diaphoresis.  HENT: Negative for trouble swallowing.   Respiratory: Positive for cough and shortness of breath.   Cardiovascular: Positive for chest pain. Negative for palpitations and syncope.  Gastrointestinal: Positive for nausea, vomiting, abdominal pain and anorexia.  Musculoskeletal: Positive for back pain.  Neurological: Positive for dizziness, weakness and headaches. Negative for numbness.  All other systems reviewed and are negative.    Allergies  Aspirin  Home Medications   Current Outpatient Rx  Name  Route  Sig  Dispense  Refill  . phenytoin (DILANTIN) 100 MG ER capsule   Oral   Take 100 mg by mouth 3 (three) times daily.         . sertraline (ZOLOFT) 100 MG  tablet   Oral   Take 100 mg by mouth daily.         Marland Kitchen zolpidem (AMBIEN) 10 MG tablet   Oral   Take 10 mg by mouth at bedtime as needed for sleep.         . Aspirin-Caffeine (BC FAST PAIN RELIEF PO)   Oral   Take by mouth.          BP 120/66  Pulse 85  Temp(Src) 98.1 F (36.7 C) (Oral)  Resp 12  SpO2 100%  LMP 10/21/2013 Physical Exam  Nursing note and vitals reviewed. Constitutional: She is oriented to person, place, and time. She appears  well-developed and well-nourished.  Anxious appearing  HENT:  Head: Normocephalic and atraumatic.  Right Ear: External ear normal.  Left Ear: External ear normal.  Nose: Nose normal.  Mouth/Throat: Oropharynx is clear and moist. No oropharyngeal exudate.  Eyes: Conjunctivae are normal. Pupils are equal, round, and reactive to light. No scleral icterus.  Neck: Normal range of motion. Neck supple.  Cardiovascular: Regular rhythm and normal heart sounds.  Exam reveals no gallop and no friction rub.   No murmur heard. tachycardia  Pulmonary/Chest: Effort normal and breath sounds normal. No respiratory distress. She has no wheezes. She has no rales. She exhibits no tenderness.  Abdominal: Soft. Bowel sounds are normal. She exhibits no distension and no mass. There is tenderness. There is no rebound and no guarding.    Musculoskeletal: Normal range of motion. She exhibits no edema and no tenderness.  Lymphadenopathy:    She has no cervical adenopathy.  Neurological: She is alert and oriented to person, place, and time. She exhibits normal muscle tone. Coordination normal.  Skin: Skin is warm and dry. No rash noted. No erythema. No pallor.  Psychiatric: Her behavior is normal. Judgment and thought content normal. Her mood appears anxious. Her speech is rapid and/or pressured.    ED Course  Procedures (including critical care time) Labs Review Labs Reviewed  CBC - Abnormal; Notable for the following:    HCT 35.8 (*)    All other components within normal limits  COMPREHENSIVE METABOLIC PANEL - Abnormal; Notable for the following:    Total Bilirubin 1.3 (*)    All other components within normal limits  PHENYTOIN LEVEL, TOTAL - Abnormal; Notable for the following:    Phenytoin Lvl <2.5 (*)    All other components within normal limits  LIPASE, BLOOD  D-DIMER, QUANTITATIVE  POCT I-STAT TROPONIN I  POCT PREGNANCY, URINE   Imaging Review Dg Chest 2 View  11/11/2013   CLINICAL DATA:   Chest pain  EXAM: CHEST  2 VIEW  COMPARISON:  02/07/2013  FINDINGS: The heart size and mediastinal contours are within normal limits. Both lungs are clear. The visualized skeletal structures are unremarkable.  IMPRESSION: No active cardiopulmonary disease.   Electronically Signed   By: Alcide Clever M.D.   On: 11/11/2013 11:04    EKG Interpretation    Date/Time:  Friday November 11 2013 09:31:41 EST Ventricular Rate:  101 PR Interval:  144 QRS Duration: 76 QT Interval:  377 QTC Calculation: 489 R Axis:   87 Text Interpretation:  Sinus tachycardia Biatrial enlargement Non-specific ST-t changes Borderline prolonged QT interval Baseline wander in lead(s) V2  aside from rate, similar to EKG from 05/2012 Confirmed by KOHUT  MD, STEPHEN ([id number]) on 11/11/2013 11:10:36 AM           Results for orders placed during the hospital encounter  of 11/11/13  CBC      Result Value Range   WBC 4.2  4.0 - 10.5 K/uL   RBC 4.09  3.87 - 5.11 MIL/uL   Hemoglobin 12.9  12.0 - 15.0 g/dL   HCT 40.9 (*) 81.1 - 91.4 %   MCV 87.5  78.0 - 100.0 fL   MCH 31.5  26.0 - 34.0 pg   MCHC 36.0  30.0 - 36.0 g/dL   RDW 78.2  95.6 - 21.3 %   Platelets 232  150 - 400 K/uL  COMPREHENSIVE METABOLIC PANEL      Result Value Range   Sodium 138  135 - 145 mEq/L   Potassium 3.6  3.5 - 5.1 mEq/L   Chloride 102  96 - 112 mEq/L   CO2 25  19 - 32 mEq/L   Glucose, Bld 79  70 - 99 mg/dL   BUN 11  6 - 23 mg/dL   Creatinine, Ser 0.86  0.50 - 1.10 mg/dL   Calcium 9.2  8.4 - 57.8 mg/dL   Total Protein 7.3  6.0 - 8.3 g/dL   Albumin 4.2  3.5 - 5.2 g/dL   AST 16  0 - 37 U/L   ALT 8  0 - 35 U/L   Alkaline Phosphatase 67  39 - 117 U/L   Total Bilirubin 1.3 (*) 0.3 - 1.2 mg/dL   GFR calc non Af Amer >90  >90 mL/min   GFR calc Af Amer >90  >90 mL/min  LIPASE, BLOOD      Result Value Range   Lipase 14  11 - 59 U/L  PHENYTOIN LEVEL, TOTAL      Result Value Range   Phenytoin Lvl <2.5 (*) 10.0 - 20.0 ug/mL  D-DIMER, QUANTITATIVE       Result Value Range   D-Dimer, Quant 0.30  0.00 - 0.48 ug/mL-FEU  POCT I-STAT TROPONIN I      Result Value Range   Troponin i, poc 0.00  0.00 - 0.08 ng/mL   Comment 3           POCT PREGNANCY, URINE      Result Value Range   Preg Test, Ur NEGATIVE  NEGATIVE   Dg Chest 2 View  11/11/2013   CLINICAL DATA:  Chest pain  EXAM: CHEST  2 VIEW  COMPARISON:  02/07/2013  FINDINGS: The heart size and mediastinal contours are within normal limits. Both lungs are clear. The visualized skeletal structures are unremarkable.  IMPRESSION: No active cardiopulmonary disease.   Electronically Signed   By: Alcide Clever M.D.   On: 11/11/2013 11:04      MDM  Chronic back pain Chronic chest pain Anxiety  Patient with a questionable history of SSD and seizures presents with back and anterior chest pain with radiation down left arm.  She has a family history of early CAD though she states that her sister died from "a valve problem" and not CAD.  As this pain has been going on for months and she has had a previous negative work up in July 2013, we are doubtful this is cardiac in nature.  Troponin negative, no EKG changes, d-dimer negative (could not PERC because of tachycardia), chest x-ray without signs of infection.  Will give short course of narcotic pain medication.  Patient is supposed to be getting medicaid back because of mental health issues and will follow up with a PCP at that point.  She has been given strict return precautions.  Izola Price Marisue Humble, PA-C 11/11/13 1305

## 2013-11-14 NOTE — ED Provider Notes (Signed)
Medical screening examination/treatment/procedure(s) were performed by non-physician practitioner and as supervising physician I was immediately available for consultation/collaboration.  EKG Interpretation    Date/Time:  Friday November 11 2013 09:31:41 EST Ventricular Rate:  101 PR Interval:  144 QRS Duration: 76 QT Interval:  377 QTC Calculation: 489 R Axis:   87 Text Interpretation:  Sinus tachycardia Biatrial enlargement Non-specific ST-t changes Borderline prolonged QT interval Baseline wander in lead(s) V2  aside from rate, similar to EKG from 05/2012 Confirmed by Juleen China  MD, Devra Stare ([id number]) on 11/11/2013 11:10:36 AM             Raeford Razor, MD 11/14/13 1353

## 2014-02-04 ENCOUNTER — Emergency Department (HOSPITAL_COMMUNITY): Payer: Medicaid Other

## 2014-02-04 ENCOUNTER — Encounter (HOSPITAL_COMMUNITY): Payer: Self-pay | Admitting: Emergency Medicine

## 2014-02-04 ENCOUNTER — Emergency Department (HOSPITAL_COMMUNITY)
Admission: EM | Admit: 2014-02-04 | Discharge: 2014-02-04 | Disposition: A | Discharge status: Home / Self Care | Payer: Medicaid Other | Attending: Emergency Medicine | Admitting: Emergency Medicine

## 2014-02-04 DIAGNOSIS — G40909 Epilepsy, unspecified, not intractable, without status epilepticus: Secondary | ICD-10-CM | POA: Insufficient documentation

## 2014-02-04 DIAGNOSIS — F410 Panic disorder [episodic paroxysmal anxiety] without agoraphobia: Secondary | ICD-10-CM | POA: Insufficient documentation

## 2014-02-04 DIAGNOSIS — Z862 Personal history of diseases of the blood and blood-forming organs and certain disorders involving the immune mechanism: Secondary | ICD-10-CM | POA: Insufficient documentation

## 2014-02-04 DIAGNOSIS — Z87891 Personal history of nicotine dependence: Secondary | ICD-10-CM | POA: Insufficient documentation

## 2014-02-04 DIAGNOSIS — R079 Chest pain, unspecified: Secondary | ICD-10-CM | POA: Insufficient documentation

## 2014-02-04 DIAGNOSIS — F329 Major depressive disorder, single episode, unspecified: Secondary | ICD-10-CM | POA: Insufficient documentation

## 2014-02-04 DIAGNOSIS — Z79899 Other long term (current) drug therapy: Secondary | ICD-10-CM | POA: Insufficient documentation

## 2014-02-04 DIAGNOSIS — F3289 Other specified depressive episodes: Secondary | ICD-10-CM | POA: Insufficient documentation

## 2014-02-04 DIAGNOSIS — Z3202 Encounter for pregnancy test, result negative: Secondary | ICD-10-CM | POA: Insufficient documentation

## 2014-02-04 HISTORY — DX: Panic disorder (episodic paroxysmal anxiety): F41.0

## 2014-02-04 LAB — COMPREHENSIVE METABOLIC PANEL
ALT: 8 U/L (ref 0–35)
AST: 22 U/L (ref 0–37)
Albumin: 3.8 g/dL (ref 3.5–5.2)
Alkaline Phosphatase: 63 U/L (ref 39–117)
BUN: 11 mg/dL (ref 6–23)
CO2: 25 mEq/L (ref 19–32)
Calcium: 8.7 mg/dL (ref 8.4–10.5)
Chloride: 102 mEq/L (ref 96–112)
Creatinine, Ser: 0.69 mg/dL (ref 0.50–1.10)
GFR calc Af Amer: >90 mL/min (ref >90)
GFR calc non Af Amer: >90 mL/min (ref >90)
Glucose, Bld: 87 mg/dL (ref 70–99)
Potassium: 4.1 mEq/L (ref 3.7–5.3)
Sodium: 139 mEq/L (ref 137–147)
Total Bilirubin: 0.7 mg/dL (ref 0.3–1.2)
Total Protein: 7.1 g/dL (ref 6.0–8.3)

## 2014-02-04 LAB — URINALYSIS, ROUTINE W REFLEX MICROSCOPIC
Bilirubin Urine: NEGATIVE
Glucose, UA: NEGATIVE mg/dL
Hgb urine dipstick: NEGATIVE
Ketones, ur: NEGATIVE mg/dL
Leukocytes, UA: NEGATIVE
Nitrite: NEGATIVE
Protein, ur: NEGATIVE mg/dL
Specific Gravity, Urine: 1.018 (ref 1.005–1.030)
Urobilinogen, UA: 0.2 mg/dL (ref 0.0–1.0)
pH: 7 (ref 5.0–8.0)

## 2014-02-04 LAB — CBC
HCT: 38.3 % (ref 36.0–46.0)
Hemoglobin: 13.5 g/dL (ref 12.0–15.0)
MCH: 31.8 pg (ref 26.0–34.0)
MCHC: 35.2 g/dL (ref 30.0–36.0)
MCV: 90.3 fL (ref 78.0–100.0)
Platelets: 274 10*3/uL (ref 150–400)
RBC: 4.24 MIL/uL (ref 3.87–5.11)
RDW: 13 % (ref 11.5–15.5)
WBC: 5.4 10*3/uL (ref 4.0–10.5)

## 2014-02-04 LAB — I-STAT TROPONIN, ED: Troponin i, poc: 0 ng/mL (ref 0.00–0.08)

## 2014-02-04 LAB — POC URINE PREG, ED: Preg Test, Ur: NEGATIVE

## 2014-02-04 LAB — PROTIME-INR
INR: 0.99 (ref 0.00–1.49)
Prothrombin Time: 12.9 seconds (ref 11.6–15.2)

## 2014-02-04 LAB — LIPASE, BLOOD: Lipase: 14 U/L (ref 11–59)

## 2014-02-04 LAB — APTT: aPTT: 31 seconds (ref 24–37)

## 2014-02-04 MED ORDER — SODIUM CHLORIDE 0.9 % IV SOLN
1000.0000 mL | INTRAVENOUS | Status: DC
  Administered 2014-02-04: 1000 mL via INTRAVENOUS

## 2014-02-04 MED ORDER — HYDROMORPHONE HCL PF 1 MG/ML IJ SOLN
1.0000 mg | INTRAMUSCULAR | Status: DC | PRN
Start: 2014-02-04 — End: 2014-02-04
  Administered 2014-02-04 (×2): 1 mg via INTRAVENOUS
  Filled 2014-02-04 (×2): qty 1

## 2014-02-04 MED ORDER — IBUPROFEN 800 MG PO TABS: 800.0000 mg | ORAL_TABLET | Freq: Three times a day (TID) | ORAL | Status: AC

## 2014-02-04 MED ORDER — IOHEXOL 350 MG/ML SOLN
100.0000 mL | Freq: Once | INTRAVENOUS | Status: AC | PRN
  Administered 2014-02-04: 100 mL via INTRAVENOUS

## 2014-02-04 MED ORDER — METOCLOPRAMIDE HCL 5 MG/ML IJ SOLN: 10.0000 mg | Freq: Once | INTRAMUSCULAR | Status: DC

## 2014-02-04 MED ORDER — ONDANSETRON HCL 4 MG/2ML IJ SOLN
4.0000 mg | Freq: Once | INTRAMUSCULAR | Status: AC
  Administered 2014-02-04: 4 mg via INTRAVENOUS
  Filled 2014-02-04: qty 2

## 2014-02-04 NOTE — ED Provider Notes (Signed)
CSN: [id number]     Arrival date & time 02/04/14  1258 History   First MD Initiated Contact with Patient 02/04/14 1313     Chief Complaint  Patient presents with  . Chest Pain   Patient is a 28 y.o. female presenting with chest pain. The history is provided by the patient.  Chest Pain Pain location:  Substernal area Pain quality: sharp   Pain radiates to:  Mid back Pain radiates to the back: yes   Pain severity:  Severe Onset quality:  Sudden Duration:  3 days Timing:  Constant Progression:  Worsening Relieved by:  Nothing Ineffective treatments: nsaids. Associated symptoms: shortness of breath   Associated symptoms: no abdominal pain   Associated symptoms comment:  Palpitations, feels like her heart is racing, and skipping beats   Risk factors: no birth control, no coronary artery disease and no prior DVT/PE   Pt does have history of sickle cell disease.  She denies having pain similar to this with her sickle cell.   She has not been taking her medications though because of insurance issues.  Past Medical History  Diagnosis Date  . Sickle cell anemia   . Seizures   . Depression   . Panic attack    History reviewed. No pertinent past surgical history. No family history on file. History  Substance Use Topics  . Smoking status: Former Smoker    Quit date: 06/23/2012  . Smokeless tobacco: Not on file  . Alcohol Use: No   OB History   Grav Para Term Preterm Abortions TAB SAB Ect Mult Living                 Review of Systems  Respiratory: Positive for shortness of breath.   Cardiovascular: Positive for chest pain.  Gastrointestinal: Negative for abdominal pain.  All other systems reviewed and are negative.      Allergies  Aspirin  Home Medications   Current Outpatient Rx  Name  Route  Sig  Dispense  Refill  . Aspirin-Caffeine (BC FAST PAIN RELIEF PO)   Oral   Take 1 packet by mouth as needed (pain).          . clonazePAM (KLONOPIN) 0.5 MG tablet  Oral   Take 0.5 mg by mouth daily as needed for anxiety.         . naproxen sodium (ANAPROX) 220 MG tablet   Oral   Take 440 mg by mouth 2 (two) times daily as needed (pain).         . phenytoin (DILANTIN) 100 MG ER capsule   Oral   Take 100 mg by mouth 3 (three) times daily.         . sertraline (ZOLOFT) 100 MG tablet   Oral   Take 100 mg by mouth daily.         Marland Kitchen zolpidem (AMBIEN) 10 MG tablet   Oral   Take 10 mg by mouth at bedtime as needed for sleep.          BP 123/84  Pulse 96  Temp(Src) 98.7 F (37.1 C) (Oral)  Resp 25  Ht 5\' 10"  (1.778 m)  Wt 132 lb (59.875 kg)  BMI 18.94 kg/m2  SpO2 98%  LMP 02/02/2014 Physical Exam  Nursing note and vitals reviewed. Constitutional: She appears distressed.  HENT:  Head: Normocephalic and atraumatic.  Right Ear: External ear normal.  Left Ear: External ear normal.  Mouth/Throat: No oropharyngeal exudate.  Eyes: Conjunctivae are normal. Right  eye exhibits no discharge. Left eye exhibits no discharge. No scleral icterus.  Neck: Neck supple. No tracheal deviation present.  Cardiovascular: Normal rate, regular rhythm and intact distal pulses.   Pulmonary/Chest: Effort normal and breath sounds normal. No stridor. No respiratory distress. She has no wheezes. She has no rales. She exhibits tenderness.  Abdominal: Soft. Bowel sounds are normal. She exhibits no distension. There is no tenderness. There is no rebound and no guarding.  Genitourinary: No vaginal discharge found.  Musculoskeletal: She exhibits no edema.       Thoracic back: She exhibits tenderness. She exhibits no bony tenderness, no swelling and no edema.       Lumbar back: She exhibits tenderness. She exhibits no bony tenderness and no swelling.  Neurological: She is alert. She has normal strength. No cranial nerve deficit (no facial droop, extraocular movements intact, no slurred speech) or sensory deficit. She exhibits normal muscle tone. She displays no  seizure activity. Coordination normal.  Skin: Skin is warm and dry. No rash noted.  Psychiatric: She has a normal mood and affect.    ED Course  Procedures (including critical care time) Labs Review Labs Reviewed  CBC  PROTIME-INR  APTT  URINALYSIS, ROUTINE W REFLEX MICROSCOPIC  COMPREHENSIVE METABOLIC PANEL  LIPASE, BLOOD  I-STAT TROPOININ, ED  I-STAT TROPOININ, ED  POC URINE PREG, ED   Imaging Review Dg Chest 2 View  02/04/2014   CLINICAL DATA:  Chest and back pain, weakness and dizziness  EXAM: CHEST  2 VIEW  COMPARISON:  Prior chest x-ray 11/11/2013  FINDINGS: The lungs are clear and negative for focal airspace consolidation, pulmonary edema or suspicious pulmonary nodule. No pleural effusion or pneumothorax. Cardiac and mediastinal contours are within normal limits. No acute fracture or lytic or blastic osseous lesions. The visualized upper abdominal bowel gas pattern is unremarkable.  IMPRESSION: No acute cardiopulmonary process.   Electronically Signed   By: Jacqulynn Cadet M.D.   On: 02/04/2014 14:09     EKG Interpretation   Date/Time:  Saturday February 04 2014 13:11:12 EDT Ventricular Rate:  87 PR Interval:  214 QRS Duration: 85 QT Interval:  368 QTC Calculation: 443 R Axis:   79 Text Interpretation:  Sinus rhythm Prolonged PR interval prolonged PR new  since last tracing Confirmed by Sugey Trevathan  MD-J, Lamarion Mcevers ([id number]) on 02/04/2014  1:15:48 PM      MDM  Pt complaining of severe sharp chest pain.  Initial labs, xrays unremarkable.  Will CT chest to evaluate for PE/dissection.  Pt states she has history of sickle cell anemia(not trait) however not anemic here.  Sickle cell pain crisis is a possibility as well.    Kathalene Frames, MD 02/04/14 [phone]

## 2014-02-04 NOTE — ED Notes (Addendum)
Pt reports chest pressure, back pain, sob since Wednesday, R arm numbness since Thursday. Pt speaking in complete sentences. Pt reports her sister died at age 29 of '"cardiac arrest from a clot or blockage in her artery."

## 2014-02-04 NOTE — Discharge Instructions (Signed)
As discussed, it is important that you follow up as soon as possible with our cardiologists for continued management of your condition.  If you develop any new, or concerning changes in your condition, please return to the emergency department immediately.

## 2014-02-04 NOTE — ED Provider Notes (Signed)
Care of the patient assumed from Dr. Tomi Bamberger.  4:42 PM Patient w palpitations / CP. Repeat ECG shows non-sustained afib.  ECG 1626 Rate 100 - possible afib (versus artefact) and SR, rsr, abnormal  CT pending.  5:57 PM On re-exam the patient is sitting upright, VS are normal with no dysrhythmia.  I had a lengthy conversation with the patient and her sister about all results, and her prior evaluations for CP.  She reiterates that she has not previously followed up as directed, but can and will do so for this episode. She voices understanding of all results, as well as the recommendation to f/u w Cardiology in two days to arrange continuous monitoring to ascertain if there is truly an abnormality.  Patient has no Hx of this. Patient states that she has an allergy to aspirin.   She has no RF for afib / cva and given that the ECG only had a brief possible episode of afib, Coumadin seems presumptive.  In addition, she will see cardiology in two days, and given the chronicity of her pain, it seems reasonable to not initiate full anticoagulation until additional evaluation has occurred. With no e/o atypical ACS, PTX, PNA, sustained dysrhythmia, or other acute changes, she was appropriate for additional evaluation and management to occur as an outpatient.    Carmin Muskrat, MD 02/04/14 [phone]

## 2014-02-04 NOTE — ED Notes (Signed)
Pt combative at bedside, requesting pain medication. MD notified. Prescription for advil/motrin given. Pt yelling, stating prescription will not do anything for her. Security at bedside.

## 2014-02-21 ENCOUNTER — Encounter: Payer: Self-pay | Admitting: Radiology

## 2014-02-21 ENCOUNTER — Ambulatory Visit (INDEPENDENT_AMBULATORY_CARE_PROVIDER_SITE_OTHER): Payer: Medicaid Other | Admitting: Cardiology

## 2014-02-21 ENCOUNTER — Encounter: Payer: Self-pay | Admitting: Cardiology

## 2014-02-21 ENCOUNTER — Encounter (INDEPENDENT_AMBULATORY_CARE_PROVIDER_SITE_OTHER): Payer: Medicaid Other

## 2014-02-21 VITALS — BP 110/62 | HR 102 | Ht 70.0 in | Wt 134.0 lb

## 2014-02-21 DIAGNOSIS — I4729 Other ventricular tachycardia: Secondary | ICD-10-CM

## 2014-02-21 DIAGNOSIS — R002 Palpitations: Secondary | ICD-10-CM

## 2014-02-21 DIAGNOSIS — I472 Ventricular tachycardia, unspecified: Secondary | ICD-10-CM

## 2014-02-21 DIAGNOSIS — R079 Chest pain, unspecified: Secondary | ICD-10-CM

## 2014-02-21 NOTE — Progress Notes (Signed)
Patient ID: [name], female   DOB: May 02, 1986, 28 y.o.   MRN: [id number] Evo 48hr holter applied

## 2014-02-21 NOTE — Progress Notes (Signed)
[address], [place], [place]  [address] Phone: [phone] Fax:  [phone]  Date:  02/21/2014   ID:  [name], DOB 08/09/1986, MRN [id number]  PCP:  No primary provider on file.  Cardiologist:  Fransico Him, MD     History of Present Illness: [name] is a 28 y.o. female who presents today for further evaluation of chest pain and palpitations.  She was recently seen in the ER for chest pain.  She has been having CP and palpitations since July of 2013.  She was hospitalized in July 2013 with sharp stabbing chest pain in the chest along with fluttering and cardiac workup was normal.  2D echo at that time showed low normal LVF EF 50-55%.  The pain occurs every day intermittent throughout the day.  The pain is midsternal and radiates into her back and her left arm associated with left arm tingling.  She has been tired and fatigued.  She has also noticed SOB in which she cannot take a deep breath.  Chest CT angio on 02/04/14 showed no PE.  She says that the chest pain is only present when she gets the palpitations.  She says that the palpitations make her dizzy but she has never had syncope.  She has noticed some LE edema in her legs, ankles and feet.  She does use some table salt.   Wt Readings from Last 3 Encounters:  02/21/14 134 lb (60.782 kg)  02/04/14 132 lb (59.875 kg)  06/06/12 134 lb (60.782 kg)     Past Medical History  Diagnosis Date  . Sickle cell anemia   . Seizures   . Depression   . Panic attack     Current Outpatient Prescriptions  Medication Sig Dispense Refill  . Aspirin-Caffeine (BC FAST PAIN RELIEF PO) Take 1 packet by mouth as needed (pain).       . clonazePAM (KLONOPIN) 0.5 MG tablet Take 0.5 mg by mouth daily as needed for anxiety.      . gabapentin (NEURONTIN) 300 MG capsule Take 300 mg by mouth 3 (three) times daily.      . phenytoin (DILANTIN) 100 MG ER capsule Take 100 mg by mouth 3 (three) times daily.      Marland Kitchen zolpidem (AMBIEN) 10  MG tablet Take 10 mg by mouth at bedtime as needed for sleep.       No current facility-administered medications for this visit.    Allergies:    Allergies  Allergen Reactions  . Aspirin Hives    Social History:  The patient  reports that she quit smoking about 20 months ago. She does not have any smokeless tobacco history on file. She reports that she does not drink alcohol or use illicit drugs.   Family History:  The patient's family history is not on file.   ROS:  Please see the history of present illness.       All other systems reviewed and negative.   PHYSICAL EXAM: VS:  BP 110/62  Pulse 102  Ht 5\' 10"  (1.778 m)  Wt 134 lb (60.782 kg)  BMI 19.23 kg/m2  LMP 02/02/2014 Well nourished, well developed, in no acute distress HEENT: normal Neck: no JVD Cardiac:  normal S1, S2; RRR; no murmur Lungs:  clear to auscultation bilaterally, no wheezing, rhonchi or rales Abd: soft, nontender, no hepatomegaly Ext: no edema Skin: warm and dry Neuro:  CNs 2-12 intact, no focal abnormalities noted  EKG:  NSR with  no ST changes     ASSESSMENT AND PLAN:  1. Palpitations with ventricular ectopy of a RBBB morphology on EKG at hospital.  Baseline EKG is normal with normal intervals.  Of concern is her family history of sudden cardiac death in her mom at 75, sister at 28 and maternal grandmother.   - discussed with Dr. Caryl Comes.  Will get a cardiac MRI and 48 hour Holter to assess degree of ectopy.   - formal EP consult with Dr. Caryl Comes 2. Chest pain which only occurs in the setting of palpitations - 2D echo 2013 with low normal LVF EF 50-55%  Followup with me in 4 weeks  Signed, Fransico Him, MD 02/21/2014 8:21 AM

## 2014-02-21 NOTE — Patient Instructions (Addendum)
Your physician has recommended that you wear a holter monitor. Holter monitors are medical devices that record the heart's electrical activity. Doctors most often use these monitors to diagnose arrhythmias. Arrhythmias are problems with the speed or rhythm of the heartbeat. The monitor is a small, portable device. You can wear one while you do your normal daily activities. This is usually used to diagnose what is causing palpitations/syncope (passing out). [facility] has requested that you have a cardiac MRI. Cardiac MRI uses a computer to create images of your heart as its beating, producing both still and moving pictures of your heart and major blood vessels. For further information please visit [email or web address]-[email or web address].[email or web address] Please follow the instruction sheet given to you today for more information.  RETURN SOON TO SEE DR Caryl Comes  Follow up with Dr Radford Pax in 4 weeks  Your physician recommends that you continue on your current medications as directed. Please refer to the Current Medication list given to you today.

## 2014-02-28 ENCOUNTER — Encounter: Payer: Self-pay | Admitting: Internal Medicine

## 2014-02-28 ENCOUNTER — Ambulatory Visit (INDEPENDENT_AMBULATORY_CARE_PROVIDER_SITE_OTHER): Payer: Medicaid Other | Admitting: Internal Medicine

## 2014-02-28 VITALS — BP 144/86 | HR 77 | Ht 70.0 in | Wt 136.2 lb

## 2014-02-28 DIAGNOSIS — I4729 Other ventricular tachycardia: Secondary | ICD-10-CM

## 2014-02-28 DIAGNOSIS — R079 Chest pain, unspecified: Secondary | ICD-10-CM

## 2014-02-28 DIAGNOSIS — I472 Ventricular tachycardia, unspecified: Secondary | ICD-10-CM

## 2014-02-28 DIAGNOSIS — R000 Tachycardia, unspecified: Secondary | ICD-10-CM
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
## 2014-02-28 NOTE — Patient Instructions (Addendum)
Your physician recommends that you schedule a follow-up appointment in:  4-5 weeks in our [facility] office   Your physician has recommended that you wear an event monitor. Event monitors are medical devices that record the heart's electrical activity. Doctors most often [place] these monitors to diagnose arrhythmias. Arrhythmias are problems with the speed or rhythm of the heartbeat. The monitor is a small, portable device. You can wear one while you do your normal daily activities. This is usually used to diagnose what is causing palpitations/syncope (passing out).   You will have a Signal Averaged EKG at [facility] on 03/01/14 at 0945 am. Please arrive at registration and admitting.

## 2014-03-01 ENCOUNTER — Ambulatory Visit (HOSPITAL_COMMUNITY)
Admission: RE | Admit: 2014-03-01 | Discharge: 2014-03-01 | Disposition: A | Discharge status: Home / Self Care | Payer: Medicaid Other | Source: Ambulatory Visit | Attending: Internal Medicine | Admitting: Internal Medicine

## 2014-03-01 DIAGNOSIS — I472 Ventricular tachycardia, unspecified: Secondary | ICD-10-CM | POA: Insufficient documentation

## 2014-03-01 DIAGNOSIS — I4729 Other ventricular tachycardia: Secondary | ICD-10-CM | POA: Insufficient documentation

## 2014-03-02 ENCOUNTER — Telehealth: Payer: Self-pay | Admitting: Cardiology

## 2014-03-02 ENCOUNTER — Encounter: Payer: Self-pay | Admitting: Cardiology

## 2014-03-02 NOTE — Telephone Encounter (Signed)
Pt is aware.  

## 2014-03-02 NOTE — Telephone Encounter (Signed)
Please let patient know that heart monitor showed NSR with average HR 87bpm, sinus tachycarda up to 162bpm and 1 PVC.  She had one dropped QRS with a junctional escape beat.  No VT.  She had symptoms in her diary very frequently but most of the time did not correlate with any arrhythmias except on occasion sinus tachycardia

## 2014-03-02 NOTE — Telephone Encounter (Signed)
This encounter was created in error - please disregard.

## 2014-03-03 DIAGNOSIS — I471 Supraventricular tachycardia: Secondary | ICD-10-CM

## 2014-03-06 ENCOUNTER — Telehealth: Payer: Self-pay | Admitting: *Deleted

## 2014-03-06 NOTE — Telephone Encounter (Signed)
Per, Herb Grays,  [facility] patient is in SVT with a rates reaching 166 at 12:26 pm Heart rate sustained for about 1 minute and trended down to 97 They attempted to contact patient she did not answer  I attempted to contact patient she did not answer   Informed Dr. Caryl Comes  He instructed me to ask patient if she was symptomatic if/when she calls back.   Patient called back as I was typing note  She stated she felt dizzy and her heart felt fluttery at the time of the event  She also reported a headache and feeling like her right arm was numb  She denies any other symptoms  Informed Dr. Caryl Comes of her symptoms   Dr. Caryl Comes instructed that patients appt be moved to 03/23/14  And that her right arm tingling was probably unrelated to her heart  Instructed patient that if her heart rate sustains at elevated levels or her symptoms become persistent to go to the ED or contact ems  Patient verbalized understanding and was instructed on how to check her own pulse

## 2014-03-07 ENCOUNTER — Observation Stay (HOSPITAL_COMMUNITY)
Admission: EM | Admit: 2014-03-07 | Discharge: 2014-03-08 | Disposition: A | Discharge status: Home / Self Care | Payer: Medicaid Other | Attending: Cardiology | Admitting: Cardiology

## 2014-03-07 ENCOUNTER — Telehealth: Payer: Self-pay

## 2014-03-07 ENCOUNTER — Encounter (HOSPITAL_COMMUNITY): Payer: Self-pay | Admitting: Emergency Medicine

## 2014-03-07 DIAGNOSIS — Z3202 Encounter for pregnancy test, result negative: Secondary | ICD-10-CM | POA: Diagnosis not present

## 2014-03-07 DIAGNOSIS — F450 Somatization disorder: Secondary | ICD-10-CM | POA: Insufficient documentation

## 2014-03-07 DIAGNOSIS — R569 Unspecified convulsions: Secondary | ICD-10-CM

## 2014-03-07 DIAGNOSIS — F1910 Other psychoactive substance abuse, uncomplicated: Secondary | ICD-10-CM | POA: Diagnosis not present

## 2014-03-07 DIAGNOSIS — N920 Excessive and frequent menstruation with regular cycle: Secondary | ICD-10-CM | POA: Insufficient documentation

## 2014-03-07 DIAGNOSIS — D571 Sickle-cell disease without crisis: Secondary | ICD-10-CM | POA: Diagnosis not present

## 2014-03-07 DIAGNOSIS — F32A Depression, unspecified: Secondary | ICD-10-CM

## 2014-03-07 DIAGNOSIS — R112 Nausea with vomiting, unspecified: Secondary | ICD-10-CM | POA: Diagnosis not present

## 2014-03-07 DIAGNOSIS — F609 Personality disorder, unspecified: Secondary | ICD-10-CM

## 2014-03-07 DIAGNOSIS — I499 Cardiac arrhythmia, unspecified: Secondary | ICD-10-CM | POA: Diagnosis not present

## 2014-03-07 DIAGNOSIS — R5381 Other malaise: Secondary | ICD-10-CM | POA: Insufficient documentation

## 2014-03-07 DIAGNOSIS — Z87891 Personal history of nicotine dependence: Secondary | ICD-10-CM | POA: Insufficient documentation

## 2014-03-07 DIAGNOSIS — Z79899 Other long term (current) drug therapy: Secondary | ICD-10-CM | POA: Diagnosis not present

## 2014-03-07 DIAGNOSIS — R079 Chest pain, unspecified: Secondary | ICD-10-CM

## 2014-03-07 DIAGNOSIS — F319 Bipolar disorder, unspecified: Secondary | ICD-10-CM | POA: Diagnosis not present

## 2014-03-07 DIAGNOSIS — R002 Palpitations: Secondary | ICD-10-CM | POA: Insufficient documentation

## 2014-03-07 DIAGNOSIS — F329 Major depressive disorder, single episode, unspecified: Secondary | ICD-10-CM

## 2014-03-07 DIAGNOSIS — R042 Hemoptysis: Secondary | ICD-10-CM | POA: Diagnosis not present

## 2014-03-07 DIAGNOSIS — R0789 Other chest pain: Secondary | ICD-10-CM | POA: Diagnosis not present

## 2014-03-07 DIAGNOSIS — R5383 Other fatigue: Secondary | ICD-10-CM

## 2014-03-07 DIAGNOSIS — I10 Essential (primary) hypertension: Secondary | ICD-10-CM | POA: Insufficient documentation

## 2014-03-07 DIAGNOSIS — G40909 Epilepsy, unspecified, not intractable, without status epilepticus: Secondary | ICD-10-CM | POA: Insufficient documentation

## 2014-03-07 DIAGNOSIS — N6459 Other signs and symptoms in breast: Secondary | ICD-10-CM | POA: Diagnosis not present

## 2014-03-07 DIAGNOSIS — R011 Cardiac murmur, unspecified: Secondary | ICD-10-CM | POA: Insufficient documentation

## 2014-03-07 DIAGNOSIS — F1210 Cannabis abuse, uncomplicated: Secondary | ICD-10-CM | POA: Diagnosis not present

## 2014-03-07 DIAGNOSIS — A088 Other specified intestinal infections: Secondary | ICD-10-CM | POA: Insufficient documentation

## 2014-03-07 DIAGNOSIS — F4310 Post-traumatic stress disorder, unspecified: Secondary | ICD-10-CM | POA: Diagnosis not present

## 2014-03-07 DIAGNOSIS — R109 Unspecified abdominal pain: Secondary | ICD-10-CM | POA: Insufficient documentation

## 2014-03-07 DIAGNOSIS — R0602 Shortness of breath: Secondary | ICD-10-CM | POA: Insufficient documentation

## 2014-03-07 DIAGNOSIS — Z888 Allergy status to other drugs, medicaments and biological substances status: Secondary | ICD-10-CM | POA: Insufficient documentation

## 2014-03-07 DIAGNOSIS — F410 Panic disorder [episodic paroxysmal anxiety] without agoraphobia: Secondary | ICD-10-CM | POA: Diagnosis not present

## 2014-03-07 DIAGNOSIS — N644 Mastodynia: Secondary | ICD-10-CM | POA: Insufficient documentation

## 2014-03-07 HISTORY — DX: Post-traumatic stress disorder, unspecified: F43.10

## 2014-03-07 LAB — COMPREHENSIVE METABOLIC PANEL
ALT: 7 U/L (ref 0–35)
AST: 17 U/L (ref 0–37)
Albumin: 4.1 g/dL (ref 3.5–5.2)
Alkaline Phosphatase: 58 U/L (ref 39–117)
BUN: 7 mg/dL (ref 6–23)
CO2: 23 mEq/L (ref 19–32)
Calcium: 9.5 mg/dL (ref 8.4–10.5)
Chloride: 101 mEq/L (ref 96–112)
Creatinine, Ser: 0.64 mg/dL (ref 0.50–1.10)
GFR calc Af Amer: >90 mL/min (ref >90)
GFR calc non Af Amer: >90 mL/min (ref >90)
Glucose, Bld: 126 mg/dL — ABNORMAL HIGH (ref 70–99)
Potassium: 3.5 mEq/L — ABNORMAL LOW (ref 3.7–5.3)
Sodium: 140 mEq/L (ref 137–147)
Total Bilirubin: 0.9 mg/dL (ref 0.3–1.2)
Total Protein: 7.2 g/dL (ref 6.0–8.3)

## 2014-03-07 LAB — CBC
HCT: 36.9 % (ref 36.0–46.0)
Hemoglobin: 13.4 g/dL (ref 12.0–15.0)
MCH: 32.1 pg (ref 26.0–34.0)
MCHC: 36.3 g/dL — ABNORMAL HIGH (ref 30.0–36.0)
MCV: 88.3 fL (ref 78.0–100.0)
Platelets: 241 10*3/uL (ref 150–400)
RBC: 4.18 MIL/uL (ref 3.87–5.11)
RDW: 11.9 % (ref 11.5–15.5)
WBC: 5.2 10*3/uL (ref 4.0–10.5)

## 2014-03-07 LAB — PREGNANCY, URINE: Preg Test, Ur: NEGATIVE

## 2014-03-07 LAB — PRO B NATRIURETIC PEPTIDE: Pro B Natriuretic peptide (BNP): 46.4 pg/mL (ref 0–125)

## 2014-03-07 MED ORDER — ONDANSETRON HCL 4 MG/2ML IJ SOLN: 4.0000 mg | Freq: Four times a day (QID) | INTRAMUSCULAR | Status: DC | PRN

## 2014-03-07 MED ORDER — TRAMADOL HCL 50 MG PO TABS
25.0000 mg | ORAL_TABLET | Freq: Three times a day (TID) | ORAL | Status: DC | PRN
  Administered 2014-03-07: 25 mg via ORAL
  Filled 2014-03-07: qty 1

## 2014-03-07 MED ORDER — CLONAZEPAM 0.5 MG PO TABS: 0.5000 mg | ORAL_TABLET | Freq: Every day | ORAL | Status: DC | PRN

## 2014-03-07 MED ORDER — ACETAMINOPHEN 325 MG PO TABS
650.0000 mg | ORAL_TABLET | ORAL | Status: DC | PRN
  Administered 2014-03-07: 650 mg via ORAL
  Filled 2014-03-07: qty 2

## 2014-03-07 MED ORDER — GABAPENTIN 300 MG PO CAPS
300.0000 mg | ORAL_CAPSULE | Freq: Three times a day (TID) | ORAL | Status: DC
  Administered 2014-03-07 – 2014-03-08 (×2): 300 mg via ORAL
  Filled 2014-03-07 (×4): qty 1

## 2014-03-07 MED ORDER — POTASSIUM CHLORIDE CRYS ER 20 MEQ PO TBCR
40.0000 meq | EXTENDED_RELEASE_TABLET | Freq: Once | ORAL | Status: AC
  Administered 2014-03-07: 40 meq via ORAL
  Filled 2014-03-07: qty 2

## 2014-03-07 MED ORDER — HEPARIN SODIUM (PORCINE) 5000 UNIT/ML IJ SOLN
5000.0000 [IU] | Freq: Three times a day (TID) | INTRAMUSCULAR | Status: DC
  Administered 2014-03-07 – 2014-03-08 (×2): 5000 [IU] via SUBCUTANEOUS
  Filled 2014-03-07 (×5): qty 1

## 2014-03-07 MED ORDER — ZOLPIDEM TARTRATE 5 MG PO TABS
5.0000 mg | ORAL_TABLET | Freq: Every evening | ORAL | Status: DC | PRN
  Administered 2014-03-07: 5 mg via ORAL
  Filled 2014-03-07: qty 1

## 2014-03-07 MED ORDER — PHENYTOIN SODIUM EXTENDED 100 MG PO CAPS
100.0000 mg | ORAL_CAPSULE | Freq: Three times a day (TID) | ORAL | Status: DC
  Administered 2014-03-07 – 2014-03-08 (×2): 100 mg via ORAL
  Filled 2014-03-07 (×4): qty 1

## 2014-03-07 NOTE — Telephone Encounter (Signed)
Dr. Caryl Comes called and stated he reviewed [facility] fax  He stated to inform the patient that she was in sinus tach   Left voicemail to let patient know

## 2014-03-07 NOTE — ED Notes (Signed)
The patient has had chest pain and "fluttering heart beat" since June 2013.  The patient has had chest pain on and off since 2013.  She has been diagnosed with Ventricular Tachycardia and has had to wear a monitor twice.  The patient is seeing a cardiologist and has a monitor on now.  Patient rates her pain 9/10.  Patient has N/V, weakness, back pain, diaphoresis.

## 2014-03-07 NOTE — ED Provider Notes (Signed)
CSN: [id number]     Arrival date & time 03/07/14  1633 History   First MD Initiated Contact with Patient 03/07/14 1756     Chief Complaint  Patient presents with  . Chest Pain    Patient also feels like she has fluttering     (Consider location/radiation/quality/duration/timing/severity/associated sxs/prior Treatment) Patient is a 28 y.o. female presenting with chest pain. The history is provided by the patient.  Chest Pain Associated symptoms: abdominal pain, fatigue, nausea, shortness of breath and vomiting   Associated symptoms: no back pain, no headache, no numbness and no weakness    patient with chest pain for last few months. Has had workup including echo. Scheduled for cardiac MRI in 2 days. She had a ventricular tachycardia and has been seen by electrophysiology. She also has been wearing a cardiac monitor and it showed SVT versus sinus tachycardia today. Patient states she's been having more episodes. She states she feels her heart go fast and feels it goes slow also. She thinks she is going to die. She gets chest pain with this episode. She was told to come in the ER by her cardiologist. No fevers. She's had nausea and vomiting with these episodes also. She states she gets lightheaded with the episode.  Past Medical History  Diagnosis Date  . Sickle cell anemia   . Seizures   . Depression   . Panic attack   . Hypertension   . Heart murmur   . Arrhythmia   . PTSD (post-traumatic stress disorder)    History reviewed. No pertinent past surgical history. Family History  Problem Relation Age of Onset  . Heart attack Mother   . Heart disease Mother   . Sudden death Mother   . Sudden death Sister   . Heart attack Sister   . Sudden death Maternal Grandmother   . Fainting Maternal Grandmother    History  Substance Use Topics  . Smoking status: Former Smoker    Quit date: 06/23/2012  . Smokeless tobacco: Not on file  . Alcohol Use: No   OB History   Grav Para Term  Preterm Abortions TAB SAB Ect Mult Living                 Review of Systems  Constitutional: Positive for fatigue. Negative for activity change and appetite change.  Eyes: Negative for pain.  Respiratory: Positive for shortness of breath. Negative for chest tightness.   Cardiovascular: Positive for chest pain. Negative for leg swelling.  Gastrointestinal: Positive for nausea, vomiting and abdominal pain. Negative for diarrhea.  Genitourinary: Negative for flank pain.  Musculoskeletal: Negative for back pain and neck stiffness.  Skin: Negative for rash.  Neurological: Negative for weakness, numbness and headaches.  Psychiatric/Behavioral: Negative for behavioral problems.      Allergies  Aspirin  Home Medications   Prior to Admission medications   Medication Sig Start Date End Date Taking? Authorizing Provider  clonazePAM (KLONOPIN) 0.5 MG tablet Take 0.5 mg by mouth daily as needed for anxiety.   Yes Historical Provider, MD  gabapentin (NEURONTIN) 300 MG capsule Take 300 mg by mouth 3 (three) times daily.   Yes Historical Provider, MD  phenytoin (DILANTIN) 100 MG ER capsule Take 100 mg by mouth 3 (three) times daily.   Yes Historical Provider, MD  zolpidem (AMBIEN) 10 MG tablet Take 10 mg by mouth at bedtime as needed for sleep.   Yes Historical Provider, MD   BP 148/86  Pulse 105  Temp(Src) 98.6  F (37 C) (Oral)  Resp 20  SpO2 100% Physical Exam  Nursing note and vitals reviewed. Constitutional: She is oriented to person, place, and time. She appears well-developed and well-nourished.  HENT:  Head: Normocephalic and atraumatic.  Eyes: EOM are normal. Pupils are equal, round, and reactive to light.  Neck: Normal range of motion. Neck supple.  Cardiovascular: Normal rate, regular rhythm and normal heart sounds.   No murmur heard. Pulmonary/Chest: Effort normal and breath sounds normal. No respiratory distress. She has no wheezes. She has no rales.  Abdominal: Soft. Bowel  sounds are normal. She exhibits no distension. There is no tenderness. There is no rebound and no guarding.  Musculoskeletal: Normal range of motion.  Neurological: She is alert and oriented to person, place, and time. No cranial nerve deficit.  Skin: Skin is warm and dry.  Psychiatric: Her speech is normal.  Patient appears anxious    ED Course  Procedures (including critical care time) Labs Review Labs Reviewed  CBC - Abnormal; Notable for the following:    MCHC 36.3 (*)    All other components within normal limits  COMPREHENSIVE METABOLIC PANEL  PRO B NATRIURETIC PEPTIDE  I-STAT CHEM 8, ED  I-STAT TROPOININ, ED    Imaging Review No results found.   EKG Interpretation   Date/Time:  Tuesday March 07 2014 16:42:53 EDT Ventricular Rate:  105 PR Interval:  162 QRS Duration: 74 QT Interval:  326 QTC Calculation: 430 R Axis:   83 Text Interpretation:  Sinus tachycardia Right atrial enlargement  Nonspecific ST abnormality Abnormal ECG Confirmed by Alvino Chapel  MD, Ovid Curd  [phone]) on 03/07/2014 6:15:03 PM      MDM   Final diagnoses:  None    Patient with chest pain and palpitations. She's currently on a monitor. No further ventricular tachycardia. Patient is a chest pain. She scheduled for MRI in 2 days. She'll be seen in the ED by cardiology and admitted to the hospital.    Jasper Riling. Alvino Chapel, MD 03/07/14 [phone]

## 2014-03-07 NOTE — Telephone Encounter (Signed)
See note from 03/06/2014.

## 2014-03-07 NOTE — Telephone Encounter (Signed)
AJ with [facility] called to let us know Ms. Lanagan had an event of SVT of 40 seconds with rate of 165 bpm. AJ will fax the report over now for [place] to review. Spoke with Ms. Frandsen regarding the SVT; patient states heart fluttering, feels like could black out, is nauseated with vomiting. Please advise what to do?  I told the patient to contact EMS immediately. Told the patient not to drive herself nor have anybody else take her except for the EMS. The patient states, "I will hang up now and contact the EMS to come and get me."   Patient called and said EMS came to her house and did an EKG and she was back in normal rhythm and did not need to go to the hospital  Dr. Caryl Comes informed via telephone  Dr. Caryl Comes instructed me to fax strips to [phone]  Ryan from [facility] to fax reports now  Reports faxed to Dr. Caryl Comes 4/14 4 pm
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
## 2014-03-07 NOTE — Telephone Encounter (Signed)
AJ with [facility] called to let us know [name] had an event of SVT of 40 seconds with rate of 165 bpm. AJ will fax the report over now for [place] to review. Spoke with [name] regarding the SVT; patient states heart fluttering, feels like could black out, is nauseated with vomiting. Please advise what to do?  I told the patient to contact EMS immediately. Told the patient not to drive herself nor have anybody else take her except for the EMS. The patient states, "I will hang up now and contact the EMS to come and get me."

## 2014-03-08 DIAGNOSIS — R002 Palpitations: Secondary | ICD-10-CM | POA: Diagnosis not present

## 2014-03-08 DIAGNOSIS — F319 Bipolar disorder, unspecified: Secondary | ICD-10-CM | POA: Diagnosis not present

## 2014-03-08 DIAGNOSIS — A088 Other specified intestinal infections: Secondary | ICD-10-CM | POA: Diagnosis not present

## 2014-03-08 DIAGNOSIS — R0789 Other chest pain: Secondary | ICD-10-CM | POA: Diagnosis not present

## 2014-03-08 LAB — CBC
HCT: 35.1 % — ABNORMAL LOW (ref 36.0–46.0)
Hemoglobin: 12.8 g/dL (ref 12.0–15.0)
MCH: 32.4 pg (ref 26.0–34.0)
MCHC: 36.5 g/dL — ABNORMAL HIGH (ref 30.0–36.0)
MCV: 88.9 fL (ref 78.0–100.0)
Platelets: 210 10*3/uL (ref 150–400)
RBC: 3.95 MIL/uL (ref 3.87–5.11)
RDW: 12 % (ref 11.5–15.5)
WBC: 5 10*3/uL (ref 4.0–10.5)

## 2014-03-08 LAB — BASIC METABOLIC PANEL
BUN: 8 mg/dL (ref 6–23)
CO2: 26 mEq/L (ref 19–32)
Calcium: 9.2 mg/dL (ref 8.4–10.5)
Chloride: 104 mEq/L (ref 96–112)
Creatinine, Ser: 0.67 mg/dL (ref 0.50–1.10)
GFR calc Af Amer: >90 mL/min (ref >90)
GFR calc non Af Amer: >90 mL/min (ref >90)
Glucose, Bld: 83 mg/dL (ref 70–99)
Potassium: 3.9 mEq/L (ref 3.7–5.3)
Sodium: 142 mEq/L (ref 137–147)

## 2014-03-08 LAB — TSH: TSH: 2.19 u[IU]/mL (ref 0.350–4.500)

## 2014-03-08 MED ORDER — METOPROLOL SUCCINATE ER 25 MG PO TB24: 25.0000 mg | ORAL_TABLET | Freq: Every day | ORAL | Status: DC

## 2014-03-08 NOTE — Discharge Summary (Signed)
ELECTROPHYSIOLOGY DISCHARGE SUMMARY   Patient ID: [name],  MRN: [id number], DOB/AGE: [birth year] 28 y.o.  Admit date: 03/07/2014 Discharge date: 03/08/2014  Primary Cardiologist / EP: Berton Mount, MD  Primary Discharge Diagnosis:  1. Palpitations - probable dysautonomic component in context of significant psychosocial stress  Secondary Discharge Diagnoses:  1. History of NSVT / VT - RBBB/RAD 2. PTSD  Procedures This Admission:  None   History and Hospital Course:  [name] is a 28 year old woman who was admitted yesterday with chest pain and palpitations. These symptoms have been occurring intermittently for which Dr. Graciela Husbands ordered and event monitor, cardiac MRI and signal averaged ECG (the latter two are still pending). Yesterday strips were received from [facility] revealing a long RP tachycardia with some abrupt shifts in RR intervals but without appreciable change in P wave morpholology, most consistent with sinus tachycardia. Urine pregnancy test negative. TSH normal. Potassium 3.5. Normal serum Cr and LFTs. ECG on admission showed sinus tachycardia at 105 bpm. She was observed overnight and remains hemodynamically stable. Telemetry reveals SR, no arrhythmia. She was evaluated by Dr. Graciela Husbands this morning. She has probable dysautonomia in setting of significant psychosocial stress. Orthostatics were done and aggressive volume repletion recommended. Due to questionable history of sickle cell, Dr. Graciela Husbands ordered a sickle cell panel. Results pending at time of discharge. In addition, she has made an appointment with [facility]. She has been seen, examined and deemed stable for discharge home today by Dr. Graciela Husbands. She will follow-up with him in the office in 2 weeks.  Discharge Vitals: Blood pressure 133/105, pulse 92, temperature 98.5 F (36.9 C), temperature source Oral, resp. rate 16, height 5\' 10"  (1.778 m), weight 133 lb 6.4 oz (60.51 kg), SpO2 100.00%.   Labs: Lab Results    Component Value Date   WBC 5.0 03/08/2014   HGB 12.8 03/08/2014   HCT 35.1* 03/08/2014   MCV 88.9 03/08/2014   PLT 210 03/08/2014    Recent Labs Lab 03/07/14 1646 03/08/14 0530  NA 140 142  K 3.5* 3.9  CL 101 104  CO2 23 26  BUN 7 8  CREATININE 0.64 0.67  CALCIUM 9.5 9.2  PROT 7.2  --   BILITOT 0.9  --   ALKPHOS 58  --   ALT 7  --   AST 17  --   GLUCOSE 126* 83    Disposition:  The patient is being discharged in stable condition.  Follow-up: Follow-up Information   Follow up with Sherryl Manges, MD On 03/23/2014. (At 9:45 AM)    Specialty:  Cardiology   Contact information:   [address] [place] [address] [phone]      Discharge Medications:    Medication List         clonazePAM 0.5 MG tablet  Commonly known as:  KLONOPIN  Take 0.5 mg by mouth daily as needed for anxiety.     gabapentin 300 MG capsule  Commonly known as:  NEURONTIN  Take 300 mg by mouth 3 (three) times daily.     metoprolol succinate 25 MG 24 hr tablet  Commonly known as:  TOPROL XL  Take 1 tablet (25 mg total) by mouth daily.     phenytoin 100 MG ER capsule  Commonly known as:  DILANTIN  Take 100 mg by mouth 3 (three) times daily.     zolpidem 10 MG tablet  Commonly known as:  AMBIEN  Take 10 mg by mouth  at bedtime as needed for sleep.       Duration of Discharge Encounter: Greater than 30 minutes including physician time.  SignedHerby Abraham Mystic Labo, PA-C 03/08/2014, 11:10 AM

## 2014-03-08 NOTE — Telephone Encounter (Signed)
Patient confirmed receipt of voicemail  She was admitted into the hospital last night and Dr. Caryl Comes saw her this am

## 2014-03-08 NOTE — Progress Notes (Signed)
       Patient Name: [name]      SUBJECTIVE: Admitted with chest pain and palpitations Yday we received strips of long RP tachycardia with some abrupt shifts RR intervals but without appreciable change in P wave morphology most consistent with sinus  Feel a little better ,   Heat intolerant>>seizures,  Shower and exercise LH and SOB ,  Some tend to hgh blood pressure    Past Medical History  Diagnosis Date  . Sickle cell anemia   . Seizures   . Depression   . Panic attack   . Hypertension   . Heart murmur   . Arrhythmia   . PTSD (post-traumatic stress disorder)     Scheduled Meds:  Scheduled Meds: . gabapentin  300 mg Oral TID  . heparin  5,000 Units Subcutaneous 3 times per day  . phenytoin  100 mg Oral TID   Continuous Infusions:   PHYSICAL EXAM Filed Vitals:   03/07/14 1938 03/07/14 2028 03/08/14 0003 03/08/14 0400  BP: 123/84 139/91 121/78 125/61  Pulse: 77 79 72 73  Temp: 98.6 F (37 C) 98.3 F (36.8 C) 98 F (36.7 C) 98.2 F (36.8 C)  TempSrc: Oral Oral    Resp: 15 15 16 16   Height:  5\' 10"  (1.778 m)    Weight:  133 lb 6.4 oz (60.51 kg)    SpO2: 100% 100% 100% 98%    Well developed and nourished in no acute distress HENT normal Neck supple with JVP-flat Clear Regular rate and rhythm, no murmurs or gallops Abd-soft with active BS No Clubbing cyanosis edema Skin-warm and dry A & Oriented  Grossly normal sensory and motor function   TELEMETRY: Reviewed telemetry pt in nsr:   No intake or output data in the 24 hours ending 03/08/14 0725  LABS: Basic Metabolic Panel:  Recent Labs Lab 03/07/14 1646 03/08/14 0530  NA 140 142  K 3.5* 3.9  CL 101 104  CO2 23 26  GLUCOSE 126* 83  BUN 7 8  CREATININE 0.64 0.67  CALCIUM 9.5 9.2   Cardiac Enzymes: No results found for this basename: CKTOTAL, CKMB, CKMBINDEX, TROPONINI,  in the last 72 hours CBC:  Recent Labs Lab 03/07/14 1646 03/08/14 0530  WBC 5.2 5.0  HGB 13.4 12.8    HCT 36.9 35.1*  MCV 88.3 88.9  PLT 241 210   PROTIME: No results found for this basename: LABPROT, INR,  in the last 72 hours Liver Function Tests:  Recent Labs  03/07/14 1646  AST 17  ALT 7  ALKPHOS 58  BILITOT 0.9  PROT 7.2  ALBUMIN 4.1   No results found for this basename: LIPASE, AMYLASE,  in the last 72 hours BNP: BNP (last 3 results)  Recent Labs  03/07/14 1646  PROBNP 46.4    Recent Labs  03/08/14 0530  TSH 2.190     ASSESSMENT AND PLAN:  Active Problems:   Palpitations  Prob dysautonomic component in context of significnat psychosocial stress Unclear hx of sickle Will do orthostatics and discharge agreesvie volume repletion Add low dose metop succ 25 at discharge followup as arranged  She has sought appt at [facility] Signed, Deboraha Sprang MD  03/08/2014

## 2014-03-08 NOTE — Discharge Instructions (Signed)
Cardiac Diet °This diet can help prevent heart disease and stroke. Many factors influence your heart health, including eating and exercise habits. Coronary risk rises a lot with abnormal blood fat (lipid) levels. Cardiac meal planning includes limiting unhealthy fats, increasing healthy fats, and making other small dietary changes. General guidelines are as follows: °· Adjust calorie intake to reach and maintain desirable body weight. °· Limit total fat intake to less than 30% of total calories. Saturated fat should be less than 7% of calories. °· Saturated fats are found in animal products and in some vegetable products. Saturated vegetable fats are found in coconut oil, cocoa butter, palm oil, and palm kernel oil. Read labels carefully to avoid these products as much as possible. Use butter in moderation. Choose tub margarines and oils that have 2 grams of fat or less. Good cooking oils are canola and olive oils. °· Practice low-fat cooking techniques. Do not fry food. Instead, broil, bake, boil, steam, grill, roast on a rack, stir-fry, or microwave it. Other fat reducing suggestions include: °· Remove the skin from poultry. °· Remove all visible fat from meats. °· Skim the fat off stews, soups, and gravies before serving them. °· Steam vegetables in water or broth instead of sautéing them in fat. °· Avoid foods with trans fat (or hydrogenated oils), such as commercially fried foods and commercially baked goods. Commercial shortening and deep-frying fats will contain trans fat. °· Increase intake of fruits, vegetables, whole grains, and legumes to replace foods high in fat. °· Increase consumption of nuts, legumes, and seeds to at least 4 servings weekly. One serving of a legume equals ½ cup, and 1 serving of nuts or seeds equals ¼ cup. °· Choose whole grains more often. Have 3 servings per day (a serving is 1 ounce [oz]). °· Eat 4 to 5 servings of vegetables per day. A serving of vegetables is 1 cup of raw leafy  vegetables; ½ cup of raw or cooked cut-up vegetables; ½ cup of vegetable juice. °· Eat 4 to 5 servings of fruit per day. A serving of fruit is 1 medium whole fruit; ¼ cup of dried fruit; ½ cup of fresh, frozen, or canned fruit; ½ cup of 100% fruit juice. °· Increase your intake of dietary fiber to 20 to 30 grams per day. Insoluble fiber may help lower your risk of heart disease and may help curb your appetite.  °Soluble fiber binds cholesterol to be removed from the blood. Foods high in soluble fiber are dried beans, citrus fruits, oats, apples, bananas, broccoli, Brussels sprouts, and eggplant. °· Try to include foods fortified with plant sterols or stanols, such as yogurt, breads, juices, or margarines. Choose several fortified foods to achieve a daily intake of 2 to 3 grams of plant sterols or stanols. °· Foods with omega-3 fats can help reduce your risk of heart disease. Aim to have a 3.5 oz portion of fatty fish twice per week, such as salmon, mackerel, albacore tuna, sardines, lake trout, or herring. If you wish to take a fish oil supplement, choose one that contains 1 gram of both DHA and EPA. °· Limit processed meats to 2 servings (3 oz portion) weekly. °· Limit the sodium in your diet to 1500 milligrams (mg) per day. If you have high blood pressure, talk to a registered dietitian about a DASH (Dietary Approaches to Stop Hypertension) eating plan. °· Limit sweets and beverages with added sugar, such as soda, to no more than 5 servings per week. One   serving is:   °· 1 tablespoon sugar. °· 1 tablespoon jelly or jam. °· ½ cup sorbet. °· 1 cup lemonade. °· ½ cup regular soda. °CHOOSING FOODS °Starches °· Allowed: Breads: All kinds (wheat, rye, raisin, white, oatmeal, Italian, French, and English muffin bread). Low-fat rolls: English muffins, frankfurter and hamburger buns, bagels, pita bread, tortillas (not fried). Pancakes, waffles, biscuits, and muffins made with recommended oil. °· Avoid: Products made with  saturated or trans fats, oils, or whole milk products. Butter rolls, cheese breads, croissants. Commercial doughnuts, muffins, sweet rolls, biscuits, waffles, pancakes, store-bought mixes. °Crackers °· Allowed: Low-fat crackers and snacks: Animal, Mcclatchy, rye, saltine (with recommended oil, no lard), oyster, and matzo crackers. Bread sticks, melba toast, rusks, flatbread, pretzels, and light popcorn. °· Avoid: High-fat crackers: cheese crackers, butter crackers, and those made with coconut, palm oil, or trans fat (hydrogenated oils). Buttered popcorn. °Cereals °· Allowed: Hot or cold whole-grain cereals. °· Avoid: Cereals containing coconut, hydrogenated vegetable fat, or animal fat. °Potatoes / Pasta / Rice °· Allowed: All kinds of potatoes, rice, and pasta (such as macaroni, spaghetti, and noodles). °· Avoid: Pasta or rice prepared with cream sauce or high-fat cheese. Chow mein noodles, French fries. °Vegetables °· Allowed: All vegetables and vegetable juices. °· Avoid: Fried vegetables. Vegetables in cream, butter, or high-fat cheese sauces. Limit coconut. Fruit in cream or custard. °Protein °· Allowed: Limit your intake of meat, seafood, and poultry to no more than 6 oz (cooked weight) per day. All lean, well-trimmed beef, veal, pork, and lamb. All chicken and turkey without skin. All fish and shellfish. Wild game: wild duck, rabbit, pheasant, and venison. Egg whites or low-cholesterol egg substitutes may be used as desired. Meatless dishes: recipes with dried beans, peas, lentils, and tofu (soybean curd). Seeds and nuts: all seeds and most nuts. °· Avoid: Prime grade and other heavily marbled and fatty meats, such as short ribs, spare ribs, rib eye roast or steak, frankfurters, sausage, bacon, and high-fat luncheon meats, mutton. Caviar. Commercially fried fish. Domestic duck, goose, venison sausage. Organ meats: liver, gizzard, heart, chitterlings, brains, kidney, sweetbreads. °Dairy °· Allowed: Low-fat  cheeses: nonfat or low-fat cottage cheese (1% or 2% fat), cheeses made with part skim milk, such as mozzarella, farmers, string, or ricotta. (Cheeses should be labeled no more than 2 to 6 grams fat per oz.). Skim (or 1%) milk: liquid, powdered, or evaporated. Buttermilk made with low-fat milk. Drinks made with skim or low-fat milk or cocoa. Chocolate milk or cocoa made with skim or low-fat (1%) milk. Nonfat or low-fat yogurt. °· Avoid: Whole milk cheeses, including colby, cheddar, muenster, Monterey Jack, Havarti, Brie, Camembert, American, Swiss, and blue. Creamed cottage cheese, cream cheese. Whole milk and whole milk products, including buttermilk or yogurt made from whole milk, drinks made from whole milk. Condensed milk, evaporated whole milk, and 2% milk. °Soups and Combination Foods °· Allowed: Low-fat low-sodium soups: broth, dehydrated soups, homemade broth, soups with the fat removed, homemade cream soups made with skim or low-fat milk. Low-fat spaghetti, lasagna, chili, and Spanish rice if low-fat ingredients and low-fat cooking techniques are used. °· Avoid: Cream soups made with whole milk, cream, or high-fat cheese. All other soups. °Desserts and Sweets °· Allowed: Sherbet, fruit ices, gelatins, meringues, and angel food cake. Homemade desserts with recommended fats, oils, and milk products. Jam, jelly, honey, marmalade, sugars, and syrups. Pure sugar candy, such as gum drops, hard candy, jelly beans, marshmallows, mints, and small amounts of dark chocolate. °· Avoid: Commercially prepared   cakes, pies, cookies, frosting, pudding, or mixes for these products. Desserts containing whole milk products, chocolate, coconut, lard, palm oil, or palm kernel oil. Ice cream or ice cream drinks. Candy that contains chocolate, coconut, butter, hydrogenated fat, or unknown ingredients. Buttered syrups. °Fats and Oils °· Allowed: Vegetable oils: safflower, sunflower, corn, soybean, cottonseed, sesame, canola, olive,  or peanut. Non-hydrogenated margarines. Salad dressing or mayonnaise: homemade or commercial, made with a recommended oil. Low or nonfat salad dressing or mayonnaise. °· Limit added fats and oils to 6 to 8 tsp per day (includes fats used in cooking, baking, salads, and spreads on bread). Remember to count the "hidden fats" in foods. °· Avoid: Solid fats and shortenings: butter, lard, salt pork, bacon drippings. Gravy containing meat fat, shortening, or suet. Cocoa butter, coconut. Coconut oil, palm oil, palm kernel oil, or hydrogenated oils: these ingredients are often used in bakery products, nondairy creamers, whipped toppings, candy, and commercially fried foods. Read labels carefully. Salad dressings made of unknown oils, sour cream, or cheese, such as blue cheese and Roquefort. Cream, all kinds: half-and-half, light, heavy, or whipping. Sour cream or cream cheese (even if "light" or low-fat). Nondairy cream substitutes: coffee creamers and sour cream substitutes made with palm, palm kernel, hydrogenated oils, or coconut oil. °Beverages °· Allowed: Coffee (regular or decaffeinated), tea. Diet carbonated beverages, mineral water. Alcohol: Check with your caregiver. Moderation is recommended. °· Avoid: Whole milk, regular sodas, and juice drinks with added sugar. °Condiments °· Allowed: All seasonings and condiments. Cocoa powder. "Cream" sauces made with recommended ingredients. °· Avoid: Carob powder made with hydrogenated fats. °SAMPLE MENU °Breakfast °· ½ cup orange juice °· ½ cup oatmeal °· 1 slice toast °· 1 tsp margarine °· 1 cup skim milk °Lunch °· Turkey sandwich with 2 oz turkey, 2 slices bread °· Lettuce and tomato slices °· Fresh fruit °· Carrot sticks °· Coffee or tea °Snack °· Fresh fruit or low-fat crackers °Dinner °· 3 oz lean ground beef °· 1 baked potato °· 1 tsp margarine °· ½ cup asparagus °· Lettuce salad °· 1 tbs non-creamy dressing °· ½ cup peach slices °· 1 cup skim milk °Document Released:  08/19/2008 Document Revised: 05/11/2012 Document Reviewed: 02/03/2012 °[employer]® Patient Information ©2014 [employer]. ° °

## 2014-03-09 ENCOUNTER — Ambulatory Visit (HOSPITAL_COMMUNITY)
Admission: RE | Admit: 2014-03-09 | Discharge: 2014-03-09 | Disposition: A | Discharge status: Home / Self Care | Payer: Medicaid Other | Source: Ambulatory Visit | Attending: Cardiology | Admitting: Cardiology

## 2014-03-09 DIAGNOSIS — I472 Ventricular tachycardia, unspecified: Secondary | ICD-10-CM

## 2014-03-09 LAB — SICKLE CELL SCREEN: Sickle Cell Screen: NEGATIVE

## 2014-03-10 NOTE — Progress Notes (Signed)
Please find out if patient rescheduled her cardiac MRI

## 2014-03-13 ENCOUNTER — Telehealth: Payer: Self-pay | Admitting: Internal Medicine

## 2014-03-13 NOTE — Telephone Encounter (Signed)
New message     Pt is on metoprolol-----she is very tired and vomiting.  She has been eating before taking medication. Should she stop medication?

## 2014-03-13 NOTE — Telephone Encounter (Signed)
Left message on personal voicemail to stop medication (per [employer], PA-C). Explained I would call her tomorrow to confirm she received this message.

## 2014-03-14 NOTE — Telephone Encounter (Signed)
Confirmed w/ pt she received voicemail yesterday about stopping Metoprolol. Advised to call us by beginning of next week to let us know how she is doing off the medication.   Pt is agreeable to plan.

## 2014-03-16 ENCOUNTER — Encounter: Payer: Self-pay | Admitting: Cardiology

## 2014-03-17 ENCOUNTER — Telehealth: Payer: Self-pay | Admitting: *Deleted

## 2014-03-17 MED ORDER — DILTIAZEM HCL ER COATED BEADS 120 MG PO CP24: 120.0000 mg | ORAL_CAPSULE | Freq: Every day | ORAL | Status: DC

## 2014-03-17 NOTE — Telephone Encounter (Signed)
[facility] called and stated patient is having another run of SVT max rate is 174 They attempted to contact patient and did not get an answer   I called patient and she stated that she had to stop taking her Toprol because it made her to sick  I discussed this with Dr. Fletcher Anon and reviewed the holter report faxed by [facility]  Per Dr. Fletcher Anon stop Toprol and start Diltiazem 120 mg daily    Reviewed medication change with patient  Instructed patient to notify EMS if her situation becomes emergent  Patient verbalized understanding

## 2014-03-23 ENCOUNTER — Ambulatory Visit (INDEPENDENT_AMBULATORY_CARE_PROVIDER_SITE_OTHER): Payer: Medicaid Other | Admitting: Internal Medicine

## 2014-03-23 ENCOUNTER — Encounter: Payer: Self-pay | Admitting: Internal Medicine

## 2014-03-23 VITALS — BP 122/79 | HR 78 | Ht 70.0 in | Wt 136.0 lb

## 2014-03-23 DIAGNOSIS — R079 Chest pain, unspecified: Secondary | ICD-10-CM

## 2014-03-23 DIAGNOSIS — R002 Palpitations: Secondary | ICD-10-CM

## 2014-03-23 DIAGNOSIS — R1115 Cyclical vomiting syndrome unrelated to migraine: Secondary | ICD-10-CM

## 2014-03-23 NOTE — Patient Instructions (Addendum)
You have been referred to GI. Dr. Carlean Purl. If you do not hear from his office in at least a week call me. Elmyra Ricks [phone]  Your physician recommends that you schedule a follow-up appointment in:  4-8 weeks with Dr. Caryl Comes in his [place] office.

## 2014-03-23 NOTE — Progress Notes (Signed)
      Patient Care Team: No Pcp Per Patient as PCP - General (General Practice)   HPI  [name] is a 28 y.o. female Seen in followup for palpitations. Event monitoring demonstrated sinus tachycardia. She's been working in some of her other issues; she did not make her appointme\\nt at [facility] Sickle cell testing was negative. She complains of fatigue and orthostatic tachypalpitations. She is also having nausea and vomiting following in in drinking. She has not yet seen GI  Echocardiogram was normal  Past Medical History  Diagnosis Date  . Sickle cell anemia   . Seizures   . Depression   . Panic attack   . Hypertension   . Heart murmur   . Arrhythmia   . PTSD (post-traumatic stress disorder)     History reviewed. No pertinent past surgical history.  Current Outpatient Prescriptions  Medication Sig Dispense Refill  . clonazePAM (KLONOPIN) 0.5 MG tablet Take 0.5 mg by mouth daily as needed for anxiety.      . gabapentin (NEURONTIN) 300 MG capsule Take 300 mg by mouth 3 (three) times daily.      . phenytoin (DILANTIN) 100 MG ER capsule Take 100 mg by mouth 3 (three) times daily.      Marland Kitchen zolpidem (AMBIEN) 10 MG tablet Take 10 mg by mouth at bedtime as needed for sleep.       No current facility-administered medications for this visit.    Allergies  Allergen Reactions  . Aspirin Hives    Review of Systems negative except from HPI and PMH  Physical Exam BP 122/79  Pulse 78  Ht 5\' 10"  (1.778 m)  Wt 136 lb (61.689 kg)  BMI 19.51 kg/m2 Well developed and well nourished in no acute distress HENT normal E scleral and icterus clear Neck Supple JVP flat; carotids brisk and full Clear to ausculation Regular rate and rhythm, no murmurs gallops or rub Soft with active bowel sounds No clubbing cyanosis  Edema Alert and oriented, grossly normal motor and sensory function Skin Warm and Dry  Event recorder strips demonstrated only sinus  tachycardia  Assessment and  Plan  Dysautonomia-POTS  Right bundle branch block/RAD ventricular ectopy  Psychosocial stress-Anxiety/depression  Nausea and vomiting  I have given her the websites for POTS place and NDRF. We will terminate her event recorder. I've encouraged her to get from that one 3 times a day for salt augmentation. She is intolerant of her metoprolol. We might try alternative beta blockers later.  We'll arrange for her to see GI although I suspect that this is related to her autonomic issues as opposed to anything anatomical. Zofran may be of benefit.

## 2014-03-24 ENCOUNTER — Encounter: Payer: Self-pay | Admitting: Cardiology

## 2014-03-24 ENCOUNTER — Telehealth: Payer: Self-pay | Admitting: *Deleted

## 2014-03-24 ENCOUNTER — Ambulatory Visit (INDEPENDENT_AMBULATORY_CARE_PROVIDER_SITE_OTHER): Payer: Medicaid Other | Admitting: Cardiology

## 2014-03-24 ENCOUNTER — Encounter: Payer: Self-pay | Admitting: Nurse Practitioner

## 2014-03-24 VITALS — BP 102/69 | HR 106 | Ht 70.0 in | Wt 137.0 lb

## 2014-03-24 DIAGNOSIS — R000 Tachycardia, unspecified: Secondary | ICD-10-CM

## 2014-03-24 DIAGNOSIS — G90A Postural orthostatic tachycardia syndrome (POTS): Secondary | ICD-10-CM

## 2014-03-24 DIAGNOSIS — R1110 Vomiting, unspecified: Secondary | ICD-10-CM

## 2014-03-24 DIAGNOSIS — I498 Other specified cardiac arrhythmias: Secondary | ICD-10-CM | POA: Insufficient documentation

## 2014-03-24 DIAGNOSIS — I951 Orthostatic hypotension: Secondary | ICD-10-CM

## 2014-03-24 DIAGNOSIS — R002 Palpitations: Secondary | ICD-10-CM | POA: Insufficient documentation

## 2014-03-24 DIAGNOSIS — R079 Chest pain, unspecified: Secondary | ICD-10-CM

## 2014-03-24 NOTE — Telephone Encounter (Signed)
[facility] called and asked me to re enter referral correctly

## 2014-03-24 NOTE — Progress Notes (Signed)
[address] [place], [place]  [address] Phone: [phone] Fax:  [phone]  Date:  03/24/2014   ID:  BERENIS CORTER, DOB 1986-11-19, MRN [id number]  PCP:  No PCP Per Patient  Cardiologist:  Fransico Him, MD     History of Present Illness: [name] is a 28 y.o. female who presents today for followup after initial evaluation of chest pain and palpitations. She was recently seen in the ER for chest pain. She has been having CP and palpitations since July of 2013. She was hospitalized in July 2013 with sharp stabbing chest pain in the chest along with fluttering and cardiac workup was normal. 2D echo at that time showed low normal LVF EF 50-55%. The pain occurs every day intermittent throughout the day. The pain was midsternal and radiated into her back and her left arm associated with left arm tingling. She has been tired and fatigued. She has also noticed SOB in which she cannot take a deep breath. Chest CT angio on 02/04/14 showed no PE. She says that the chest pain is only present when she gets the palpitations. She says that the palpitations make her dizzy but she has never had syncope. She has noticed some LE edema in her legs, ankles and feet. She does use some table salt. She was noted to have palpitations of a RBBB morphology and was referred to Dr. Caryl Comes.  A cardiac MRI was ordered.  She was then hospitalized on 4/14 with chest pain and palpitations.  She was wearing a heart monitor at the time and it was noted that she had sinus tachcyardia.  TSH was normal and urine pregnancy test was negative.  It was felt she probably had dysautonomia and she has hydrated.  She was discharged home.  She was started on metoprolol which she did not tolerate due to fatigue and vomiting and it was stopped.  She was seen back in EP clinic and was still having fatigue and nausea.  She was given Zofran and referred to GI.  She saw Dr. Caryl Comes yesterday and she was started on sodium tablets and  gatorade.  She is supposed see him back in 6 weeks.  She says that the chest pain has improved and only comes with the palpitations.  She is getting her Cardiac MRI next week.  Wt Readings from Last 3 Encounters:  03/24/14 137 lb (62.143 kg)  03/23/14 136 lb (61.689 kg)  03/07/14 133 lb 6.4 oz (60.51 kg)     Past Medical History  Diagnosis Date  . Sickle cell anemia   . Seizures   . Depression   . Panic attack   . Hypertension   . Heart murmur   . Arrhythmia   . PTSD (post-traumatic stress disorder)     Current Outpatient Prescriptions  Medication Sig Dispense Refill  . clonazePAM (KLONOPIN) 0.5 MG tablet Take 0.5 mg by mouth daily as needed for anxiety.      . gabapentin (NEURONTIN) 300 MG capsule Take 300 mg by mouth 3 (three) times daily.      . phenytoin (DILANTIN) 100 MG ER capsule Take 100 mg by mouth 3 (three) times daily.      Marland Kitchen zolpidem (AMBIEN) 10 MG tablet Take 10 mg by mouth at bedtime as needed for sleep.       No current facility-administered medications for this visit.    Allergies:    Allergies  Allergen Reactions  . Aspirin Hives  Social History:  The patient  reports that she quit smoking about 21 months ago. She does not have any smokeless tobacco history on file. She reports that she does not drink alcohol or use illicit drugs.   Family History:  The patient's family history includes Fainting in her maternal grandmother; Heart attack in her mother and sister; Heart disease in her mother; Sudden death in her maternal grandmother, mother, and sister.   ROS:  Please see the history of present illness.      All other systems reviewed and negative.   PHYSICAL EXAM: VS:  BP 102/69  Pulse 106  Ht 5\' 10"  (1.778 m)  Wt 137 lb (62.143 kg)  BMI 19.66 kg/m2 Well nourished, well developed, in no acute distress HEENT: normal Neck: no JVD Cardiac:  normal S1, S2; RRR; no murmur Lungs:  clear to auscultation bilaterally, no wheezing, rhonchi or rales Abd:  soft, nontender, no hepatomegaly Ext: no edema Skin: warm and dry Neuro:  CNs 2-12 intact, no focal abnormalities noted      ASSESSMENT AND PLAN:  1. Atypical Chest pain that she describes as a pressure and heaviness that only occurs when she gets the palpitations.  I will get an ETT to make sure there is no ischemia from any congenital abnormality of coronary arteries although I suspect this is all related to her POTS.  She has a Cardiac MRI scheduled for next week.  If both are normal then I am going to have her follow with Dr. Caryl Comes for treatment of POTS. 2. POTS - encouraged her to follow recommendations from Dr. Caryl Comes on salt and fluid intacke  Signed, Fransico Him, MD 03/24/2014 3:58 PM

## 2014-03-24 NOTE — Telephone Encounter (Signed)
Called patient to inform her that she has GI appt with Wallace Going PA  Wednesday 03/29/14 at 10 am  Patient verbalized understanding

## 2014-03-24 NOTE — Patient Instructions (Signed)
Your physician recommends that you continue on your current medications as directed. Please refer to the Current Medication list given to you today.  Your physician has requested that you have an exercise tolerance test. For further information please visit [email or web address]. Please also follow instruction sheet, as given.   

## 2014-03-28 ENCOUNTER — Telehealth: Payer: Self-pay

## 2014-03-28 NOTE — Telephone Encounter (Signed)
Pt called and states she had some questions about her visit from last week. Please call.

## 2014-03-29 ENCOUNTER — Encounter: Payer: Self-pay | Admitting: Nurse Practitioner

## 2014-03-29 ENCOUNTER — Other Ambulatory Visit (HOSPITAL_COMMUNITY): Payer: Self-pay | Admitting: Nurse Practitioner

## 2014-03-29 ENCOUNTER — Ambulatory Visit (INDEPENDENT_AMBULATORY_CARE_PROVIDER_SITE_OTHER): Payer: Medicaid Other | Admitting: Nurse Practitioner

## 2014-03-29 ENCOUNTER — Ambulatory Visit: Payer: Self-pay | Admitting: Internal Medicine

## 2014-03-29 VITALS — BP 110/66 | HR 80 | Ht 70.0 in | Wt 136.6 lb

## 2014-03-29 DIAGNOSIS — R1319 Other dysphagia: Secondary | ICD-10-CM

## 2014-03-29 DIAGNOSIS — R1310 Dysphagia, unspecified: Secondary | ICD-10-CM

## 2014-03-29 DIAGNOSIS — R112 Nausea with vomiting, unspecified: Secondary | ICD-10-CM

## 2014-03-29 DIAGNOSIS — R12 Heartburn: Secondary | ICD-10-CM

## 2014-03-29 MED ORDER — OMEPRAZOLE 40 MG PO CPDR: 40.0000 mg | DELAYED_RELEASE_CAPSULE | Freq: Every day | ORAL | Status: DC

## 2014-03-29 MED ORDER — PROMETHAZINE HCL 12.5 MG PO TABS: 12.5000 mg | ORAL_TABLET | Freq: Four times a day (QID) | ORAL | Status: DC | PRN

## 2014-03-29 NOTE — Patient Instructions (Addendum)
You are scheduled for your Upper GI Series at [facility] on 03-31-2014 at 10 am arrive at 945 am Please nothing to eat or drink after midnight   You are scheduled for your Modified Barium Swallow with tablet at [facility] on 03-31-2014 at 1 pm arrive at 1245 pm Please nothing to eat or drink after midnight  We have sent the following medications to your pharmacy for you to pick up at your convenience: Omeprazole 40 mg please take one capsule by mouth once daily Phenergan 12.5 mg, please take one tablet by mouth every six hours as needed for nausea

## 2014-03-30 ENCOUNTER — Ambulatory Visit (HOSPITAL_COMMUNITY)
Admission: RE | Admit: 2014-03-30 | Discharge: 2014-03-30 | Disposition: A | Discharge status: Home / Self Care | Payer: Medicaid Other | Source: Ambulatory Visit | Attending: Cardiology | Admitting: Cardiology

## 2014-03-30 ENCOUNTER — Other Ambulatory Visit: Payer: Self-pay | Admitting: *Deleted

## 2014-03-30 ENCOUNTER — Encounter: Payer: Self-pay | Admitting: Nurse Practitioner

## 2014-03-30 DIAGNOSIS — R002 Palpitations: Secondary | ICD-10-CM | POA: Insufficient documentation

## 2014-03-30 DIAGNOSIS — R079 Chest pain, unspecified: Secondary | ICD-10-CM

## 2014-03-30 DIAGNOSIS — R12 Heartburn: Secondary | ICD-10-CM | POA: Insufficient documentation

## 2014-03-30 DIAGNOSIS — R1319 Other dysphagia: Secondary | ICD-10-CM

## 2014-03-30 DIAGNOSIS — R112 Nausea with vomiting, unspecified: Secondary | ICD-10-CM | POA: Insufficient documentation

## 2014-03-30 MED ORDER — LORAZEPAM 2 MG/ML IJ SOLN
1.0000 mg | Freq: Once | INTRAMUSCULAR | Status: AC
  Administered 2014-03-30: 0.5 mg via INTRAVENOUS
  Filled 2014-03-30: qty 0.5

## 2014-03-30 MED ORDER — LORAZEPAM 2 MG/ML IJ SOLN
INTRAMUSCULAR | Status: AC
  Administered 2014-03-30: 0.5 mg via INTRAVENOUS
  Filled 2014-03-30: qty 1

## 2014-03-30 MED ORDER — GADOBENATE DIMEGLUMINE 529 MG/ML IV SOLN
20.0000 mL | Freq: Once | INTRAVENOUS | Status: AC
  Administered 2014-03-30: 20 mL via INTRAVENOUS

## 2014-03-30 NOTE — Progress Notes (Signed)
HPI :  Patient is a 28 year old female, new to this practice, here for evaluation of nausea and vomiting. The patient has a history of bulimic activity which started in middle school. While patient initially purged herself she eventually began vomiting after meals involuntary. The postprandial vomiting has never really stopped. Patient reports vomiting at least twice a day after meals. A year ago patient began vomiting before and between meals as well. No history of black stools. No history of peptic ulcer disease. She does get intermittent pyrosis and solid food dysphagia, especially to meat. Her weight is stable. Patient has a very long history of anxiety and depression. She has been hospitalized for overdose and self harming behaviors.   Past Medical History  Diagnosis Date  . Sickle cell anemia   . Seizures   . Depression   . Panic attack   . Hypertension   . Heart murmur   . Arrhythmia   . PTSD (post-traumatic stress disorder)    Family History  Problem Relation Age of Onset  . Heart attack Mother   . Heart disease Mother   . Sudden death Mother   . Sudden death Sister   . Heart attack Sister   . Sudden death Maternal Grandmother   . Fainting Maternal Grandmother    History  Substance Use Topics  . Smoking status: Former Smoker    Quit date: 06/23/2012  . Smokeless tobacco: Not on file  . Alcohol Use: No   Current Outpatient Prescriptions  Medication Sig Dispense Refill  . clonazePAM (KLONOPIN) 0.5 MG tablet Take 0.5 mg by mouth daily as needed for anxiety.      . gabapentin (NEURONTIN) 300 MG capsule Take 300 mg by mouth 3 (three) times daily.      . phenytoin (DILANTIN) 100 MG ER capsule Take 100 mg by mouth 3 (three) times daily.      Marland Kitchen zolpidem (AMBIEN) 10 MG tablet Take 10 mg by mouth at bedtime as needed for sleep.      Marland Kitchen omeprazole (PRILOSEC) 40 MG capsule Take 1 capsule (40 mg total) by mouth daily.  30 capsule  3  . promethazine (PHENERGAN) 12.5 MG tablet  Take 1 tablet (12.5 mg total) by mouth every 6 (six) hours as needed for nausea or vomiting.  30 tablet  0   No current facility-administered medications for this visit.   Allergies  Allergen Reactions  . Aspirin Hives   Review of Systems: All systems reviewed and negative except where noted in HPI.   Physical Exam: BP 110/66  Pulse 80  Ht 5\' 10"  (1.778 m)  Wt 136 lb 9.6 oz (61.961 kg)  BMI 19.60 kg/m2  LMP 03/17/2014 Constitutional: Pleasant,well-developed, black female in no acute distress. HEENT: Normocephalic and atraumatic. Conjunctivae are normal. No scleral icterus. Neck supple.  Cardiovascular: Normal rate, regular rhythm.  Pulmonary/chest: Effort normal and breath sounds normal. No wheezing, rales or rhonchi. Abdominal: Soft, nondistended, nontender. Bowel sounds active throughout. There are no masses palpable. No hepatomegaly. Extremities: no edema Lymphadenopathy: No cervical adenopathy noted. Neurological: Alert and oriented to person place and time. Skin: Skin is warm and dry. No rashes noted. Psychiatric: Normal mood and affect. Behavior is normal.   ASSESSMENT AND PLAN:  58. 28 year old female with chronic nausea and vomiting. Nausea and vomiting worse over the last year but dates back to middle school when patient was purging with finger after meals. Suspect nausea / vomiting is functional in nature but will get  upper GI series at time of barium swallow.   2. Intermittent solid food dysphagia. Rule out peptic stricture. Will obtain barium swallow with tablet. If it shows stricture will schedule for EGD with dilation.   3. GERD. She has intermittent pyrosis. Recommend daily PPI before breakfast.    4. Extensive psychiatric history with anxiety / depression / suicidal ideation / overdose.   5. ? History of sickle cell. No evidence of sickle cell on screening 03/08/14.   6. POTS, followed by Cardiology

## 2014-03-30 NOTE — Addendum Note (Signed)
Addended by: Dorothy Spark on: 03/30/2014 10:28 AM   Modules accepted: Orders

## 2014-03-30 NOTE — Progress Notes (Signed)
Who referred the patient to see [place]??? In any event, agree with history, assessment, and plans as outlined. More than likely, problems with vomiting or psychogenic. If testing for organic processes are negative, she should return to her psychiatric specialist or PCP (or referring physician). Thanks

## 2014-03-31 ENCOUNTER — Inpatient Hospital Stay (HOSPITAL_COMMUNITY): Admission: RE | Admit: 2014-03-31 | Payer: Medicaid Other | Source: Ambulatory Visit

## 2014-03-31 ENCOUNTER — Ambulatory Visit (HOSPITAL_COMMUNITY): Payer: Medicaid Other

## 2014-03-31 ENCOUNTER — Ambulatory Visit (HOSPITAL_COMMUNITY): Admission: RE | Admit: 2014-03-31 | Payer: Medicaid Other | Source: Ambulatory Visit

## 2014-04-03 ENCOUNTER — Ambulatory Visit (HOSPITAL_COMMUNITY)
Admission: RE | Admit: 2014-04-03 | Discharge: 2014-04-03 | Disposition: A | Discharge status: Home / Self Care | Payer: Medicaid Other | Source: Ambulatory Visit | Attending: Nurse Practitioner | Admitting: Nurse Practitioner

## 2014-04-03 ENCOUNTER — Ambulatory Visit (HOSPITAL_COMMUNITY): Payer: Medicaid Other

## 2014-04-03 ENCOUNTER — Other Ambulatory Visit (HOSPITAL_COMMUNITY): Payer: Medicaid Other

## 2014-04-03 DIAGNOSIS — K219 Gastro-esophageal reflux disease without esophagitis: Secondary | ICD-10-CM | POA: Insufficient documentation

## 2014-04-03 DIAGNOSIS — R1310 Dysphagia, unspecified: Secondary | ICD-10-CM | POA: Insufficient documentation

## 2014-04-03 DIAGNOSIS — R1319 Other dysphagia: Secondary | ICD-10-CM

## 2014-04-28 ENCOUNTER — Ambulatory Visit (INDEPENDENT_AMBULATORY_CARE_PROVIDER_SITE_OTHER): Payer: Medicaid Other | Admitting: Physician Assistant

## 2014-04-28 DIAGNOSIS — R079 Chest pain, unspecified: Secondary | ICD-10-CM

## 2014-04-28 NOTE — Progress Notes (Signed)
Exercise Treadmill Test  Pre-Exercise Testing Evaluation Rhythm: normal sinus  Rate: 80 bpm     Test  Exercise Tolerance Test Ordering MD: Fransico Him, MD  Interpreting MD: Richardson Dopp, PA-C  Unique Test No: 1  Treadmill:  1  Indication for ETT: chest pain - rule out ischemia  Contraindication to ETT: No   Stress Modality: exercise - treadmill  Cardiac Imaging Performed: non   Protocol: standard Bruce - maximal  Max BP:  173/107  Max MPHR (bpm):  192 85% MPR (bpm):  163  MPHR obtained (bpm):  155 % MPHR obtained:  80  Reached 85% MPHR (min:sec):  n/a Total Exercise Time (min-sec):  6:00  Workload in METS:  7.0 Borg Scale: 19  Reason ETT Terminated:  patient's desire to stop    ST Segment Analysis At Rest: normal ST segments - no evidence of significant ST depression With Exercise: no evidence of significant ST depression  Other Information Arrhythmia:  No Angina during ETT:  absent (0) Quality of ETT:  non-diagnostic  ETT Interpretation:  normal - no evidence of ischemia by ST analysis at submaximal exercise  Comments: Poor exercise capacity. No chest pain. Normal BP response to exercise (baseline BP elevated 152/105). No ST-T changes to suggest ischemia at submaximal exercise.  Recommendations: Patient being worked up for POTS and chest pain. She did not achieve target HR on this ETT. Will defer whether to pursue further ischemic testing to Dr. Tomma Lightning. Radford Pax. Signed,  Richardson Dopp, PA-C   04/28/2014 10:52 AM

## 2014-04-30 NOTE — Progress Notes (Signed)
Arbie Cookey  Please notify [name] that I reviewed with Dr. Fransico Him. She does not feel that she needs any further stress testing (ie nuclear stress test). Continue with current treatment plan. Follow up with Drs. Radford Pax and Caryl Comes as planned.  Richardson Dopp, PA-C   04/30/2014 9:33 PM

## 2014-05-04 ENCOUNTER — Ambulatory Visit (INDEPENDENT_AMBULATORY_CARE_PROVIDER_SITE_OTHER): Payer: Medicaid Other | Admitting: Internal Medicine

## 2014-05-04 ENCOUNTER — Encounter: Payer: Self-pay | Admitting: Internal Medicine

## 2014-05-04 ENCOUNTER — Telehealth: Payer: Self-pay | Admitting: *Deleted

## 2014-05-04 ENCOUNTER — Other Ambulatory Visit: Payer: Self-pay

## 2014-05-04 ENCOUNTER — Ambulatory Visit (INDEPENDENT_AMBULATORY_CARE_PROVIDER_SITE_OTHER): Payer: Medicaid Other

## 2014-05-04 VITALS — BP 141/89 | HR 103 | Ht 70.0 in | Wt 138.0 lb

## 2014-05-04 DIAGNOSIS — R000 Tachycardia, unspecified: Principal | ICD-10-CM

## 2014-05-04 DIAGNOSIS — I472 Ventricular tachycardia, unspecified: Secondary | ICD-10-CM

## 2014-05-04 DIAGNOSIS — I498 Other specified cardiac arrhythmias: Secondary | ICD-10-CM

## 2014-05-04 DIAGNOSIS — R079 Chest pain, unspecified: Secondary | ICD-10-CM

## 2014-05-04 DIAGNOSIS — I4510 Unspecified right bundle-branch block: Secondary | ICD-10-CM

## 2014-05-04 DIAGNOSIS — G90A Postural orthostatic tachycardia syndrome (POTS): Secondary | ICD-10-CM

## 2014-05-04 DIAGNOSIS — I951 Orthostatic hypotension: Principal | ICD-10-CM

## 2014-05-04 DIAGNOSIS — R002 Palpitations: Secondary | ICD-10-CM

## 2014-05-04 NOTE — Patient Instructions (Signed)
Handwritten prescriptions given to you today -- DO NOT TAKE THESE MEDICATIONS AT THE SAME TIME, ONLY TAKE ONE AT A TIME 1) Atenolol 50 mg daily for two weeks -- if no improvement stop, and take #2 2) Metoprolol Succinate 50 mg daily for two weeks -- if no improvement stop, and take #3 3) Inderal LA 80 mg daily for two weeks  Please let us know which medications worked best for you.  Your physician recommends that you schedule a follow-up appointment in: 3 months with Dr. Caryl Comes.

## 2014-05-04 NOTE — Telephone Encounter (Signed)
Patient returned call  Reviewed results with patient

## 2014-05-04 NOTE — Telephone Encounter (Signed)
Called patient to inform her that per Dr. Caryl Comes her holter showed sinus tachycardia  Patient stated she was not free at the moment and would have to call me back

## 2014-05-04 NOTE — Progress Notes (Signed)
      Patient Care Team: Sueanne Margarita, MD as PCP - General (Cardiology)   HPI  [name] is a 28 y.o. female Seen in followup for palpitations that I thought was possibly dysautonomic;  that report confirmed by the event recorder to which demonstrated recurrent sinus tachycardia. She also been noted to have right bundle branch block/RAD ventricular ectopy  She struggles with PTSD and anxiety. 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 She is raising her 2 sons of her older sister.  There was a family of "cardiac arrest", however, these occurred in the context of cancer and dialysis.  She continues to significant problems with pain and fatigue. She also has intermittent tachycardia palpitations with exertion.  She has not pursued counseling  Past Medical History  Diagnosis Date  . Sickle cell anemia   . Seizures   . Depression   . Panic attack   . Hypertension   . Heart murmur   . Arrhythmia   . PTSD (post-traumatic stress disorder)     No past surgical history on file.  Current Outpatient Prescriptions  Medication Sig Dispense Refill  . clonazePAM (KLONOPIN) 0.5 MG tablet Take 0.5 mg by mouth daily as needed for anxiety.      . gabapentin (NEURONTIN) 300 MG capsule Take 300 mg by mouth 3 (three) times daily.      Marland Kitchen omeprazole (PRILOSEC) 40 MG capsule Take 1 capsule (40 mg total) by mouth daily.  30 capsule  3  . promethazine (PHENERGAN) 12.5 MG tablet Take 1 tablet (12.5 mg total) by mouth every 6 (six) hours as needed for nausea or vomiting.  30 tablet  0  . zolpidem (AMBIEN) 10 MG tablet Take 10 mg by mouth at bedtime as needed for sleep.      . phenytoin (DILANTIN) 100 MG ER capsule Take 100 mg by mouth 3 (three) times daily.       No current facility-administered medications for this visit.    Allergies  Allergen Reactions  . Aspirin Hives    Review of Systems negative except from HPI and PMH  Physical Exam BP 141/89  Pulse 103  Ht 5\' 10"  (1.778 m)   Wt 138 lb (62.596 kg)  BMI 19.80 kg/m2 Well developed and nourished in no acute distress HENT normal Neck supple with JVP-flat Clear Regular rate and rhythm, no murmurs or gallops Abd-soft with active BS No Clubbing cyanosis edema Skin-warm and dry A & Oriented  Grossly normal sensory and motor function     Assessment and  Plan  Ventricular tachycardia-right bundle branch/RAD  Dysautonomia/orthostatic intolerance   Chest pain-atypical   Anxiety/PTSD  Elevated Blood pressure   With her elevated blood pressure, we will begin her on low-dose beta blockers to see if we can mitigate some of her tachypalpitations.  We have discussed the side effects particularly fatigued and we will give her prescriptions for 3 different beta blockers.  I've encouraged her to follow up with respiration place.  She is to see a new primary care physician. I wonder whether some of her pain syndrome related to fibromyalgia given her cutaneous sensitivity.  They clearly may be aggravated by  Anxiety/depression  Event recorder demonstrated no ventricular arrhythmias; only sinus tachycardia

## 2014-05-08 ENCOUNTER — Encounter: Payer: Self-pay | Admitting: Internal Medicine

## 2014-05-08 ENCOUNTER — Ambulatory Visit: Payer: Medicaid Other | Attending: Internal Medicine | Admitting: Internal Medicine

## 2014-05-08 VITALS — BP 108/73 | HR 89 | Temp 97.6°F | Resp 14 | Ht 70.0 in | Wt 134.8 lb

## 2014-05-08 DIAGNOSIS — I10 Essential (primary) hypertension: Secondary | ICD-10-CM | POA: Insufficient documentation

## 2014-05-08 DIAGNOSIS — F3289 Other specified depressive episodes: Secondary | ICD-10-CM | POA: Insufficient documentation

## 2014-05-08 DIAGNOSIS — R569 Unspecified convulsions: Secondary | ICD-10-CM

## 2014-05-08 DIAGNOSIS — F329 Major depressive disorder, single episode, unspecified: Secondary | ICD-10-CM | POA: Insufficient documentation

## 2014-05-08 DIAGNOSIS — Z87891 Personal history of nicotine dependence: Secondary | ICD-10-CM | POA: Insufficient documentation

## 2014-05-08 DIAGNOSIS — Z79899 Other long term (current) drug therapy: Secondary | ICD-10-CM | POA: Diagnosis not present

## 2014-05-08 DIAGNOSIS — F4310 Post-traumatic stress disorder, unspecified: Secondary | ICD-10-CM | POA: Diagnosis not present

## 2014-05-08 DIAGNOSIS — M545 Low back pain, unspecified: Secondary | ICD-10-CM | POA: Diagnosis present

## 2014-05-08 DIAGNOSIS — G8929 Other chronic pain: Secondary | ICD-10-CM | POA: Diagnosis not present

## 2014-05-08 DIAGNOSIS — G40909 Epilepsy, unspecified, not intractable, without status epilepticus: Secondary | ICD-10-CM | POA: Insufficient documentation

## 2014-05-08 DIAGNOSIS — M549 Dorsalgia, unspecified: Secondary | ICD-10-CM

## 2014-05-08 DIAGNOSIS — R109 Unspecified abdominal pain: Secondary | ICD-10-CM

## 2014-05-08 DIAGNOSIS — D571 Sickle-cell disease without crisis: Secondary | ICD-10-CM | POA: Insufficient documentation

## 2014-05-08 DIAGNOSIS — Z886 Allergy status to analgesic agent status: Secondary | ICD-10-CM | POA: Diagnosis not present

## 2014-05-08 LAB — POCT URINALYSIS DIPSTICK
Glucose, UA: NEGATIVE
Ketones, UA: NEGATIVE
Leukocytes, UA: NEGATIVE
Nitrite, UA: NEGATIVE
Protein, UA: 30
Spec Grav, UA: >=1.03
Urobilinogen, UA: 1
pH, UA: 6

## 2014-05-08 MED ORDER — NAPROXEN 500 MG PO TABS: 500.0000 mg | ORAL_TABLET | Freq: Two times a day (BID) | ORAL | Status: DC

## 2014-05-08 NOTE — Patient Instructions (Signed)

## 2014-05-08 NOTE — Progress Notes (Signed)
Patient ID: [name], female   DOB: November 26, 1985, 28 y.o.   MRN: [id number]  CC:  Back, pelvic pain, seizure disorder  HPI: Patient presents to clinic today for c/o low back pain for over one year.  Patient reports that she has been fatigued but is unsure if it is related to her depression.  Patient has tried cold/heat packs, ibuprofen, pillows, and wearing a back brace for pain.  Back pain is aggravated by sitting, laying, and standing.  She denies bowel or bladder dysfunction.  She reports headaches with tinnitus daily and pressure in her ears for 4 months.  Headaches reportedly last half a day.  Patient reports swelling in her trapezius region,dizziness, nausea, and vomiting while having headache episode.  Patient also reports a history of seizure disorder that was previously being managed by [facility].  She denies any seizure activity for one year.  She reports that she was on Dilantin but stopped because she ran out of refills.  She reports that she is currently taking Gabapentin and Klonopin for stress and depression.  Patient also c/o of pelvic pain with a history of ovarian cyst a few years ago.  She reports the pain is sharp and shooting.  Her last pap was within three years and it was normal.  Patient denies unprotected sex, dyspareunia, dysuria, discharge, odor, or itch.  Patient reports that she was diagnosed with POTS disorder by her cardiologist a few weeks ago and she was then placed on beta blockers.      Allergies  Allergen Reactions  . Aspirin Hives   Past Medical History  Diagnosis Date  . Sickle cell anemia   . Seizures   . Depression   . Panic attack   . Hypertension   . Heart murmur   . Arrhythmia   . PTSD (post-traumatic stress disorder)    Current Outpatient Prescriptions on File Prior to Visit  Medication Sig Dispense Refill  . clonazePAM (KLONOPIN) 0.5 MG tablet Take 0.5 mg by mouth daily as needed for anxiety.      . gabapentin (NEURONTIN) 300 MG capsule Take  300 mg by mouth 3 (three) times daily.      Marland Kitchen omeprazole (PRILOSEC) 40 MG capsule Take 1 capsule (40 mg total) by mouth daily.  30 capsule  3  . promethazine (PHENERGAN) 12.5 MG tablet Take 1 tablet (12.5 mg total) by mouth every 6 (six) hours as needed for nausea or vomiting.  30 tablet  0  . zolpidem (AMBIEN) 10 MG tablet Take 10 mg by mouth at bedtime as needed for sleep.      . phenytoin (DILANTIN) 100 MG ER capsule Take 100 mg by mouth 3 (three) times daily.       No current facility-administered medications on file prior to visit.   Family History  Problem Relation Age of Onset  . Heart attack Mother   . Heart disease Mother   . Sudden death Mother   . Sudden death Sister   . Heart attack Sister   . Sudden death Maternal Grandmother   . Fainting Maternal Grandmother    History   Social History  . Marital Status: Single    Spouse Name: N/A    Number of Children: N/A  . Years of Education: N/A   Occupational History  . Not on file.   Social History Main Topics  . Smoking status: Former Smoker    Quit date: 06/23/2012  . Smokeless tobacco: Not on file  . Alcohol  Use: No  . Drug Use: No  . Sexual Activity: Not on file   Other Topics Concern  . Not on file   Social History Narrative  . No narrative on file    Review of Systems: See HPI   Objective:   Filed Vitals:   05/08/14 1558  BP: 108/73  Pulse: 89  Temp: 97.6 F (36.4 C)  Resp: 14    Physical Exam: Constitutional: Patient appears well-developed and well-nourished. No distress. HENT: Normocephalic, atraumatic, External right and left ear normal. Oropharynx is clear and moist.  Eyes: Conjunctivae and EOM are normal. PERRLA, no scleral icterus. Neck: Normal ROM. Neck supple. No JVD. No tracheal deviation. No thyromegaly. CVS: RRR, S1/S2 +, no murmurs, no gallops, no carotid bruit.  Pulmonary: Effort and breath sounds normal, no stridor, rhonchi, wheezes, rales.  Abdominal: Soft. BS +,  no  distension, rebound or guarding. Tenderness in left lower quadrant. Musculoskeletal: Normal range of motion. No edema. Left and right paraspinal muscle tenderness.  Lymphadenopathy: No lymphadenopathy noted, cervical,  Neuro: Alert. Normal reflexes, muscle tone coordination. No cranial nerve deficit. Skin: Skin is warm and dry. No rash noted. Not diaphoretic. No erythema. No pallor. Psychiatric: Normal mood and affect. Behavior, judgment, thought content normal.  Lab Results  Component Value Date   WBC 5.0 03/08/2014   HGB 12.8 03/08/2014   HCT 35.1* 03/08/2014   MCV 88.9 03/08/2014   PLT 210 03/08/2014   Lab Results  Component Value Date   CREATININE 0.67 03/08/2014   BUN 8 03/08/2014   NA 142 03/08/2014   K 3.9 03/08/2014   CL 104 03/08/2014   CO2 26 03/08/2014    No results found for this basename: HGBA1C   Lipid Panel     Component Value Date/Time   CHOL 109 06/07/2012 0444   TRIG 61 06/07/2012 0444   HDL 38* 06/07/2012 0444   CHOLHDL 2.9 06/07/2012 0444   VLDL 12 06/07/2012 0444   LDLCALC 59 06/07/2012 0444       Assessment and plan:   [name] was seen today for no specified reason.  Diagnoses and associated orders for this visit:  Seizures - Ambulatory referral to Neurology  Abdominal pain, unspecified site - POCT urinalysis dipstick-negative - Ambulatory referral to Gynecology to evaluate if ovarian cyst is the cause of pelvic pain  Back pain - naproxen (NAPROSYN) 500 MG tablet; Take 1 tablet (500 mg total) by mouth 2 (two) times daily with a meal. Patient has history of somatization disorder.  No clear etiology for back pain found.  Will try naproxen and re-evaluate the patient as needed. Patient urged to follow up with psychiatrist for management of depression.      Return in about 6 months (around 11/07/2014).    Chari Manning, NP-C [facility] [phone] 05/10/2014, 4:22 PM

## 2014-05-08 NOTE — Progress Notes (Signed)
Here to establish care Patient c/o back pain with fatigue, swelling of legs and ankles, jittery legs.  Patient c/o pelvic pain no abnormal vaginal discharge or odor Last Pap was 01/2013.

## 2014-05-09 ENCOUNTER — Telehealth: Payer: Self-pay | Admitting: *Deleted

## 2014-05-09 NOTE — Telephone Encounter (Signed)
Called patient for courtesy call about visit on 05/09/2014. Patient states she was upset that she received a discharge paper stating she was depressed. Informed patient that per Roney Jaffe, NP did not diagnose her with depression , but she was given that information for educational purpose. Informed patient that Roney Jaffe, NP prescribed Naproxen for her back until she sees Neuro MD. Patient states she goes to Neuro tomorrow. Asked patient is Naproxen helping. Patient states the Naproxen is helping some. Vivia Birmingham, RN

## 2014-05-10 ENCOUNTER — Encounter: Payer: Self-pay | Admitting: Neurology

## 2014-05-10 ENCOUNTER — Telehealth: Payer: Self-pay | Admitting: *Deleted

## 2014-05-10 ENCOUNTER — Ambulatory Visit (INDEPENDENT_AMBULATORY_CARE_PROVIDER_SITE_OTHER): Payer: Medicaid Other | Admitting: Neurology

## 2014-05-10 VITALS — BP 120/84 | HR 89 | Ht 70.0 in | Wt 136.0 lb

## 2014-05-10 DIAGNOSIS — M549 Dorsalgia, unspecified: Secondary | ICD-10-CM

## 2014-05-10 DIAGNOSIS — R569 Unspecified convulsions: Secondary | ICD-10-CM

## 2014-05-10 MED ORDER — PREGABALIN 75 MG PO CAPS: 75.0000 mg | ORAL_CAPSULE | Freq: Two times a day (BID) | ORAL | Status: DC

## 2014-05-10 NOTE — Progress Notes (Signed)
[facility]    Provider:  Dr Janann Colonel Referring Provider: Lance Bosch, NP Primary Care Physician:  Chari Manning, NP  CC:  seizures  HPI:  [name] is a 28 y.o. female here as a referral from Dr. Feliciana Rossetti for seizure evaluation and headache pain.   Seizures started in 2009, initially was having 2-3 times a week. Was evaluated by a neurologist in [place], she is unsure what they found. She reports having had an EEG and imaging done, unsure what the tests showed. Was started on dilantin 325m daily, stopped in February due to running out of refills. PCP gave a refill yesterday and she restarted it today. Notes it has been around 8 months since her last seizure. She does not recall anything during the episode. Based on witness reports they appear to be GTC in nature. She reports when she was a child she had a "5lb tumor on her head", states it went away on its own. No history of renal calculi or glaucoma. Has been diagnosed with PTSD, anxiety, depression.   Notes chronic pressure pain in her head. Described as bilateral, peri-orbital stabbing shooting pain. Having headaches 3 to 4 times a weeks. + Photo and phonophobia, + nausea and some emesis. With headaches gets some blurry visions. Headaches can last all day to multiple days. Gets some generalized weakness and paresthesias with the headache. Also notes severe back and diffuse MSK pain, chronic and constant.   Notes episodes of sinus tachycardia, reports being diagnosed with POTS. Followed by Dr KCaryl Comesin cardiology.   Review of Systems: Out of a complete 14 system review, the patient complains of only the following symptoms, and all other reviewed systems are negative. + memory loss, insomnia, sleepiness, restless legs, eye pain, blurred vision, double vision, weight loss, fatigue, cough, SOB, joint pain, joint swelling, cramps, aching muscles, depression, anxiety  History   Social History  . Marital Status:  Single    Spouse Name: N/A    Number of Children: N/A  . Years of Education: N/A   Occupational History  . Not on file.   Social History Main Topics  . Smoking status: Former Smoker    Quit date: 06/23/2012  . Smokeless tobacco: Never Used  . Alcohol Use: No  . Drug Use: No  . Sexual Activity: Not on file   Other Topics Concern  . Not on file   Social History Narrative   Patient is single, currently raising her nephews   Patient is right handed   Patient's education level is high school graduate   Patient doesn't drink caffeine          Family History  Problem Relation Age of Onset  . Heart attack Mother   . Heart disease Mother   . Sudden death Mother   . Sudden death Sister   . Heart attack Sister   . Sudden death Maternal Grandmother   . Fainting Maternal Grandmother     Past Medical History  Diagnosis Date  . Sickle cell anemia   . Seizures   . Depression   . Panic attack   . Hypertension   . Heart murmur   . Arrhythmia   . PTSD (post-traumatic stress disorder)     No past surgical history on file.  Current Outpatient Prescriptions  Medication Sig Dispense Refill  . clonazePAM (KLONOPIN) 0.5 MG tablet Take 0.5 mg by mouth daily as needed for anxiety.      . gabapentin (NEURONTIN) 300 MG  capsule Take 300 mg by mouth 3 (three) times daily.      . naproxen (NAPROSYN) 500 MG tablet Take 1 tablet (500 mg total) by mouth 2 (two) times daily with a meal.  60 tablet  2  . omeprazole (PRILOSEC) 40 MG capsule Take 1 capsule (40 mg total) by mouth daily.  30 capsule  3  . phenytoin (DILANTIN) 100 MG ER capsule Take 100 mg by mouth 3 (three) times daily.      . promethazine (PHENERGAN) 12.5 MG tablet Take 1 tablet (12.5 mg total) by mouth every 6 (six) hours as needed for nausea or vomiting.  30 tablet  0  . zolpidem (AMBIEN) 10 MG tablet Take 10 mg by mouth at bedtime as needed for sleep.       No current facility-administered medications for this visit.     Allergies as of 05/10/2014 - Review Complete 05/10/2014  Allergen Reaction Noted  . Aspirin Hives     Vitals: BP 120/84  Pulse 89  Ht _0  (1.778 m)  Wt 136 lb (61.689 kg)  BMI 19.51 kg/m2  LMP 04/09/2014 Last Weight:  Wt Readings from Last 1 Encounters:  05/10/14 136 lb (61.689 kg)   Last Height:   Ht Readings from Last 1 Encounters:  05/10/14 _1  (1.778 m)     Physical exam: Exam: Gen: NAD, conversant Eyes: anicteric sclerae, moist conjunctivae HENT: Atraumatic, oropharynx clear Neck: Trachea midline; supple,  Lungs: CTA, no wheezing, rales, rhonic                          CV: RRR, no MRG Abdomen: Soft, non-tender;  Extremities: No peripheral edema  Skin: Normal temperature, no rash,  Psych: Appropriate affect, pleasant  Neuro: MS: AA&Ox3, appropriately interactive, normal affect   Speech: fluent w/o paraphasic error  Memory: good recent and remote recall  CN: PERRL, EOMI no nystagmus, no ptosis, sensation intact to LT V1-V3 bilat, face symmetric, no weakness, hearing grossly intact, palate elevates symmetrically, shoulder shrug 5/5 bilat,  tongue protrudes midline, no fasiculations noted.  Motor: normal bulk and tone Strength: 5/5  In all extremities with notable giveaway weakness in bilateral LE that she reports is pain related -extremely sensitive to LT and palpation over all MSK, especially cervical and thoracic region  Coord: rapid alternating and point-to-point (FNF, HTS) movements intact.  Reflexes: symmetrical, bilat downgoing toes  Sens: LT intact in all extremities  Gait: posture, stance, stride and arm-swing normal. Tandem gait intact. Able to walk on heels and toes. Romberg absent.   Assessment:  After physical and neurologic examination, review of laboratory studies, imaging, neurophysiology testing and pre-existing records, assessment will be reviewed on the problem list.  Plan:  Treatment plan and additional workup will be  reviewed under Problem List.  1)seizures 2)Headache 3)MSK pain  27y/o woman presenting for initial evaluation of prior seizure disorder, headache and generalized MSK pain. Unclear etiology of seizures, requested that pain obtain prior records and workup. Headaches appear migrainous in nature. Unclear etiology of MSK pain, appears to be some exaggeration of her symptoms. Appears to be MSK in nature. As pain is focused mainly in cervical and thoracic region will check DG C and T spine. If unremarkable can consider checking CK, ANA, B12, TSH, ESR. Long term dilantin may not be the best option for her due to side effect profile. Will switch to Lyrica 14m BID which should also benefit headache and diffuse MSK pain.  Follow up once x-rays completed.   Jim Like, DO  [facility] [address] [place], [place] [address]  Phone [phone] Fax [phone]

## 2014-05-10 NOTE — Telephone Encounter (Signed)
Left a message for patient to return call.

## 2014-05-10 NOTE — Patient Instructions (Signed)
Overall you are doing fairly well but I do want to suggest a few things today:   Remember to drink plenty of fluid, eat healthy meals and do not skip any meals. Try to eat protein with a every meal and eat a healthy snack such as fruit or nuts in between meals. Try to keep a regular sleep-wake schedule and try to exercise daily, particularly in the form of walking, 20-30 minutes a day, if you can.   As far as your medications are concerned, I would like to suggest the following: 1)Please start Lyrica 75mg  twice a day 2)Please taper off Dilantin using the following schedule: -take 200mg  daily for 4 days then take 100mg  daily for 3 days and then stop   Please call us with any interim questions, concerns, problems, updates or refill requests.   My clinical assistant and will answer any of your questions and relay your messages to me and also relay most of my messages to you.   Our phone number is [phone]. We also have an after hours call service for urgent matters and there is a physician on-call for urgent questions. For any emergencies you know to call 911 or go to the nearest emergency room

## 2014-05-11 ENCOUNTER — Telehealth: Payer: Self-pay | Admitting: Neurology

## 2014-05-11 ENCOUNTER — Other Ambulatory Visit: Payer: Self-pay | Admitting: Neurology

## 2014-05-11 ENCOUNTER — Ambulatory Visit
Admission: RE | Admit: 2014-05-11 | Discharge: 2014-05-11 | Disposition: A | Discharge status: Home / Self Care | Payer: Medicaid Other | Source: Ambulatory Visit | Attending: Neurology | Admitting: Neurology

## 2014-05-11 DIAGNOSIS — M549 Dorsalgia, unspecified: Secondary | ICD-10-CM

## 2014-05-11 DIAGNOSIS — M545 Low back pain, unspecified: Secondary | ICD-10-CM

## 2014-05-11 NOTE — Telephone Encounter (Signed)
Spoke with patient and she said that the pain is coming from the lower part of the back and tailbone, she has difficulty standing, sitting because of that pain. She said that she had asked today when having xray but technician said that the ordering physician would have to place another order for her to do the lower back.

## 2014-05-11 NOTE — Telephone Encounter (Signed)
Informed patient and she verbalized understanding.  

## 2014-05-11 NOTE — Telephone Encounter (Signed)
Order placed for lumbar x-ray

## 2014-05-11 NOTE — Telephone Encounter (Signed)
Pt called to request an order for xray on her lower tailbone straight across. Pt states she had xray's done this morning and ask if they could do that section, they advised her that Dr. Janann Colonel would have to put this order in. Please call pt concerning this matter. Thanks

## 2014-05-12 ENCOUNTER — Ambulatory Visit
Admission: RE | Admit: 2014-05-12 | Discharge: 2014-05-12 | Disposition: A | Discharge status: Home / Self Care | Payer: Medicaid Other | Source: Ambulatory Visit | Attending: Neurology | Admitting: Neurology

## 2014-05-12 ENCOUNTER — Telehealth: Payer: Self-pay | Admitting: Neurology

## 2014-05-12 ENCOUNTER — Other Ambulatory Visit: Payer: Self-pay | Admitting: Neurology

## 2014-05-12 DIAGNOSIS — M545 Low back pain, unspecified: Secondary | ICD-10-CM

## 2014-05-12 DIAGNOSIS — IMO0001 Reserved for inherently not codable concepts without codable children: Secondary | ICD-10-CM

## 2014-05-12 NOTE — Telephone Encounter (Signed)
We have not received this request from the pharmacy.  I have, however, just contacted ins asking for a PA on Lyrica.  It is under review, and pending a response.  I called the patient back.  She is aware.

## 2014-05-12 NOTE — Telephone Encounter (Signed)
Patient came in today to pick up Rx for Lyrica.

## 2014-05-12 NOTE — Telephone Encounter (Signed)
Pt called to find out if we recd the prior authorization for her Lyrica from her pharmacy, states they advised her they fax over the request to [place]. Please call pt to confirm when recd. Thanks

## 2014-05-15 ENCOUNTER — Telehealth: Payer: Self-pay | Admitting: Neurology

## 2014-05-15 NOTE — Progress Notes (Signed)
Quick Note:  Called patient number is incorrect, called EC Terri, left message to have Damian call back. ______

## 2014-05-15 NOTE — Telephone Encounter (Signed)
Patient calling for MRI results.  thanks

## 2014-05-16 NOTE — Telephone Encounter (Signed)
Called pt to inform her per Dr. Janann Colonel that her X-rays of the Spine and Lumbar were normal and that Dr. Janann Colonel wanted the pt to stop by the office to get blood drawn. I advised the pt that if she has any other problems, questions or concerns to call the office. Pt verbalized understanding.

## 2014-05-16 NOTE — Telephone Encounter (Signed)
Patient calling for results of back xrays, please return call to patient and advise.

## 2014-05-16 NOTE — Telephone Encounter (Signed)
Called pt and left message informing her to give our office a call back.

## 2014-05-16 NOTE — Telephone Encounter (Signed)
Patient returning call to [facility], please call and advise.

## 2014-05-17 ENCOUNTER — Other Ambulatory Visit (INDEPENDENT_AMBULATORY_CARE_PROVIDER_SITE_OTHER): Payer: Self-pay

## 2014-05-17 ENCOUNTER — Encounter: Payer: Self-pay | Admitting: Obstetrics & Gynecology

## 2014-05-17 DIAGNOSIS — IMO0001 Reserved for inherently not codable concepts without codable children: Secondary | ICD-10-CM

## 2014-05-17 DIAGNOSIS — Z0289 Encounter for other administrative examinations: Secondary | ICD-10-CM

## 2014-05-18 LAB — ANA W/REFLEX IF POSITIVE: Anti Nuclear Antibody(ANA): NEGATIVE

## 2014-05-18 LAB — CK: Total CK: 128 U/L (ref 24–173)

## 2014-05-18 LAB — SEDIMENTATION RATE: Sed Rate: 2 mm/hr (ref 0–32)

## 2014-05-19 ENCOUNTER — Telehealth: Payer: Self-pay | Admitting: Neurology

## 2014-05-19 NOTE — Telephone Encounter (Signed)
Called pt to inform her per Dr. Janann Colonel that her lumbar X-rays were normal. Pt stated that she would like to get her lab work results. I informed the pt that Dr. Janann Colonel was out of the office and I will be sending the information to the [facility], Dr. Krista Blue. Please advise

## 2014-05-19 NOTE — Telephone Encounter (Signed)
Patient would like to know the results of her recent lumbar puncture. Please call to advise.

## 2014-05-19 NOTE — Telephone Encounter (Signed)
Please call patient for normal lab, ANA, CPK, ESR.

## 2014-05-22 NOTE — Telephone Encounter (Signed)
Called pt to inform her per Dr. Krista Blue, [facility], that the pt's lab work results were normal and if she has any other problems, questions or concerns to call the office. Pt verbalized understanding.

## 2014-05-22 NOTE — Telephone Encounter (Signed)
Called pt to inform her per Dr. Krista Blue, [facility], that her lab results were normal and if she has any other problems, questions or concerns to call the office. Pt verbalized understanding.

## 2014-06-10 ENCOUNTER — Encounter (HOSPITAL_COMMUNITY): Payer: Self-pay | Admitting: Emergency Medicine

## 2014-06-10 ENCOUNTER — Emergency Department (HOSPITAL_COMMUNITY): Payer: Medicaid Other

## 2014-06-10 ENCOUNTER — Emergency Department (HOSPITAL_COMMUNITY)
Admission: EM | Admit: 2014-06-10 | Discharge: 2014-06-10 | Discharge status: Against Medical Advice | Payer: Medicaid Other | Attending: Emergency Medicine | Admitting: Emergency Medicine

## 2014-06-10 ENCOUNTER — Emergency Department (HOSPITAL_COMMUNITY)
Admission: EM | Admit: 2014-06-10 | Discharge: 2014-06-11 | Disposition: A | Discharge status: Home / Self Care | Payer: Medicaid Other | Source: Home / Self Care | Attending: Emergency Medicine | Admitting: Emergency Medicine

## 2014-06-10 DIAGNOSIS — Z87891 Personal history of nicotine dependence: Secondary | ICD-10-CM | POA: Insufficient documentation

## 2014-06-10 DIAGNOSIS — R011 Cardiac murmur, unspecified: Secondary | ICD-10-CM

## 2014-06-10 DIAGNOSIS — R071 Chest pain on breathing: Secondary | ICD-10-CM | POA: Insufficient documentation

## 2014-06-10 DIAGNOSIS — Z3202 Encounter for pregnancy test, result negative: Secondary | ICD-10-CM | POA: Diagnosis not present

## 2014-06-10 DIAGNOSIS — R0789 Other chest pain: Secondary | ICD-10-CM

## 2014-06-10 DIAGNOSIS — Z79899 Other long term (current) drug therapy: Secondary | ICD-10-CM | POA: Insufficient documentation

## 2014-06-10 DIAGNOSIS — Z862 Personal history of diseases of the blood and blood-forming organs and certain disorders involving the immune mechanism: Secondary | ICD-10-CM | POA: Insufficient documentation

## 2014-06-10 DIAGNOSIS — I10 Essential (primary) hypertension: Secondary | ICD-10-CM

## 2014-06-10 DIAGNOSIS — F3289 Other specified depressive episodes: Secondary | ICD-10-CM | POA: Insufficient documentation

## 2014-06-10 DIAGNOSIS — F329 Major depressive disorder, single episode, unspecified: Secondary | ICD-10-CM | POA: Insufficient documentation

## 2014-06-10 DIAGNOSIS — Z791 Long term (current) use of non-steroidal anti-inflammatories (NSAID): Secondary | ICD-10-CM | POA: Insufficient documentation

## 2014-06-10 DIAGNOSIS — F410 Panic disorder [episodic paroxysmal anxiety] without agoraphobia: Secondary | ICD-10-CM

## 2014-06-10 DIAGNOSIS — R079 Chest pain, unspecified: Secondary | ICD-10-CM | POA: Diagnosis present

## 2014-06-10 DIAGNOSIS — G40909 Epilepsy, unspecified, not intractable, without status epilepticus: Secondary | ICD-10-CM | POA: Insufficient documentation

## 2014-06-10 LAB — CBC
HCT: 32.8 % — ABNORMAL LOW (ref 36.0–46.0)
Hemoglobin: 11.6 g/dL — ABNORMAL LOW (ref 12.0–15.0)
MCH: 31.2 pg (ref 26.0–34.0)
MCHC: 35.4 g/dL (ref 30.0–36.0)
MCV: 88.2 fL (ref 78.0–100.0)
Platelets: 243 10*3/uL (ref 150–400)
RBC: 3.72 MIL/uL — ABNORMAL LOW (ref 3.87–5.11)
RDW: 12 % (ref 11.5–15.5)
WBC: 5.3 10*3/uL (ref 4.0–10.5)

## 2014-06-10 LAB — BASIC METABOLIC PANEL
Anion gap: 14 (ref 5–15)
BUN: 11 mg/dL (ref 6–23)
CO2: 26 mEq/L (ref 19–32)
Calcium: 8.8 mg/dL (ref 8.4–10.5)
Chloride: 102 mEq/L (ref 96–112)
Creatinine, Ser: 0.68 mg/dL (ref 0.50–1.10)
GFR calc Af Amer: >90 mL/min (ref >90)
GFR calc non Af Amer: >90 mL/min (ref >90)
Glucose, Bld: 86 mg/dL (ref 70–99)
Potassium: 4.1 mEq/L (ref 3.7–5.3)
Sodium: 142 mEq/L (ref 137–147)

## 2014-06-10 LAB — TROPONIN I: Troponin I: <0.3 ng/mL (ref <0.30)

## 2014-06-10 LAB — D-DIMER, QUANTITATIVE: D-Dimer, Quant: 0.51 ug/mL-FEU — ABNORMAL HIGH (ref 0.00–0.48)

## 2014-06-10 LAB — POC URINE PREG, ED: Preg Test, Ur: NEGATIVE

## 2014-06-10 MED ORDER — LORAZEPAM 1 MG PO TABS
0.5000 mg | ORAL_TABLET | Freq: Once | ORAL | Status: AC
  Administered 2014-06-10: 0.5 mg via ORAL
  Filled 2014-06-10: qty 1

## 2014-06-10 MED ORDER — OXYCODONE-ACETAMINOPHEN 5-325 MG PO TABS
1.0000 | ORAL_TABLET | Freq: Once | ORAL | Status: AC
  Administered 2014-06-10: 1 via ORAL
  Filled 2014-06-10: qty 1

## 2014-06-10 NOTE — ED Notes (Signed)
Patient left AMA, RN attempted to get patient to stay but patient declined and continued walking down the hall toward exit.

## 2014-06-10 NOTE — ED Notes (Signed)
Spoke with patient regarding D-dimer results and advised that she be seen immediately for this. Pt states she would go to [facility]

## 2014-06-10 NOTE — ED Notes (Signed)
Patient stated earlier today her chest started hurting and the pain goes through to her back and radiates to her left arm

## 2014-06-10 NOTE — ED Notes (Signed)
Patient walking out of room stating she is leaving. RN attempted to get patient to stay but patient did not stay. MD notified.

## 2014-06-10 NOTE — ED Notes (Signed)
RN called patients number listed in contact information about D-dimer results. No answer. RN left message and a call back number for patient to return call.

## 2014-06-11 ENCOUNTER — Encounter (HOSPITAL_COMMUNITY): Payer: Self-pay | Admitting: Emergency Medicine

## 2014-06-11 ENCOUNTER — Emergency Department (HOSPITAL_COMMUNITY): Payer: Medicaid Other

## 2014-06-11 MED ORDER — ALBUTEROL SULFATE HFA 108 (90 BASE) MCG/ACT IN AERS: 2.0000 | INHALATION_SPRAY | RESPIRATORY_TRACT | Status: DC | PRN

## 2014-06-11 MED ORDER — ALBUTEROL SULFATE HFA 108 (90 BASE) MCG/ACT IN AERS: 1.0000 | INHALATION_SPRAY | Freq: Four times a day (QID) | RESPIRATORY_TRACT | Status: DC | PRN

## 2014-06-11 MED ORDER — TRAMADOL HCL 50 MG PO TABS: 50.0000 mg | ORAL_TABLET | Freq: Four times a day (QID) | ORAL | Status: DC | PRN

## 2014-06-11 MED ORDER — MORPHINE SULFATE 4 MG/ML IJ SOLN
4.0000 mg | Freq: Once | INTRAMUSCULAR | Status: AC
  Administered 2014-06-11: 4 mg via INTRAVENOUS
  Filled 2014-06-11: qty 1

## 2014-06-11 MED ORDER — ONDANSETRON HCL 4 MG/2ML IJ SOLN
4.0000 mg | Freq: Once | INTRAMUSCULAR | Status: AC
  Administered 2014-06-11: 4 mg via INTRAVENOUS
  Filled 2014-06-11: qty 2

## 2014-06-11 MED ORDER — IOHEXOL 350 MG/ML SOLN
100.0000 mL | Freq: Once | INTRAVENOUS | Status: AC | PRN
  Administered 2014-06-11: 100 mL via INTRAVENOUS

## 2014-06-11 NOTE — ED Notes (Signed)
Patient is alert and oriented x3.  She was given DC instructions and follow up visit instructions.  Patient gave verbal understanding. She was DC ambulatory under her own power to home.  V/S stable.  He was not showing any signs of distress on DC 

## 2014-06-11 NOTE — ED Provider Notes (Signed)
CSN: [id number]     Arrival date & time 06/10/14  2350 History   First MD Initiated Contact with Patient 06/11/14 0107     Chief Complaint  Patient presents with  . Abnormal Lab    @ [place] tonight     (Consider location/radiation/quality/duration/timing/severity/associated sxs/prior Treatment) HPI  The patient presents to the emergency department for evaluation of her chest pain. She went to [facility] to be seen on the evening of 06/10/2014 and decided to leave because the waiting time was too long. Labs were drawn in the waiting room and after the patient left a positive d-dimer resulted. The patient was called and made aware that she needed to be seen immediately for this result. The pain she describes his her left chest and radiates towards her back. She has numbness and tingling going down her left arm. She denies history of blood clots, hormone usage, prolonged immobilization. She admits to having a family history of blood clots for unknown cause. Past medical history is positive for hypertension, heart murmur, PTSD, panic attacks, depression and seizures.  Past Medical History  Diagnosis Date  . Sickle cell anemia   . Seizures   . Depression   . Panic attack   . Hypertension   . Heart murmur   . Arrhythmia   . PTSD (post-traumatic stress disorder)    History reviewed. No pertinent past surgical history. Family History  Problem Relation Age of Onset  . Heart attack Mother   . Heart disease Mother   . Sudden death Mother   . Sudden death Sister   . Heart attack Sister   . Sudden death Maternal Grandmother   . Fainting Maternal Grandmother    History  Substance Use Topics  . Smoking status: Former Smoker    Quit date: 06/23/2012  . Smokeless tobacco: Never Used  . Alcohol Use: Yes     Comment: ocassionally   OB History   Grav Para Term Preterm Abortions TAB SAB Ect Mult Living                 Review of Systems   Review of Systems  Gen: no weight loss,  fevers, chills, night sweats  Eyes: no discharge or drainage, no occular pain or visual changes  Nose: no epistaxis or rhinorrhea  Mouth: no dental pain, no sore throat  Neck: no neck pain  Lungs:No wheezing, coughing or hemoptysis CV: + chest pain, No palpitations, dependent edema or orthopnea  Abd: no abdominal pain, nausea, vomiting, diarrhea GU: no dysuria or gross hematuria  MSK:  No muscle weakness or pain Neuro: no headache, no focal neurologic deficits  Skin: no rash or wounds Psyche: no complaints    Allergies  Aspirin  Home Medications   Prior to Admission medications   Medication Sig Start Date End Date Taking? Authorizing Provider  atenolol (TENORMIN) 50 MG tablet Take 50 mg by mouth daily.   Yes Historical Provider, MD  calcium carbonate (TUMS - DOSED IN MG ELEMENTAL CALCIUM) 500 MG chewable tablet Chew 1-3 tablets by mouth as needed for indigestion or heartburn.   Yes Historical Provider, MD  clonazePAM (KLONOPIN) 0.5 MG tablet Take 0.5 mg by mouth daily as needed for anxiety.   Yes Historical Provider, MD  gabapentin (NEURONTIN) 300 MG capsule Take 300 mg by mouth 3 (three) times daily.   Yes Historical Provider, MD  naproxen (NAPROSYN) 500 MG tablet Take 1 tablet (500 mg total) by mouth 2 (two) times daily with  a meal. 05/08/14  Yes Lance Bosch, NP  omeprazole (PRILOSEC) 40 MG capsule Take 1 capsule (40 mg total) by mouth daily. 03/29/14  Yes Willia Craze, NP  pregabalin (LYRICA) 75 MG capsule Take 1 capsule (75 mg total) by mouth 2 (two) times daily. 05/10/14  Yes Hulen Luster, DO  promethazine (PHENERGAN) 12.5 MG tablet Take 1 tablet (12.5 mg total) by mouth every 6 (six) hours as needed for nausea or vomiting. 03/29/14  Yes Willia Craze, NP  zolpidem (AMBIEN) 10 MG tablet Take 10 mg by mouth at bedtime as needed for sleep.   Yes Historical Provider, MD  traMADol (ULTRAM) 50 MG tablet Take 1 tablet (50 mg total) by mouth every 6 (six) hours as needed.  06/11/14   Maria Coba Marilu Favre, PA-C   BP 134/83  Pulse 80  Temp(Src) 98.7 F (37.1 C) (Oral)  Resp 18  Ht 5\' 10"  (1.778 m)  Wt 140 lb (63.504 kg)  BMI 20.09 kg/m2  SpO2 100%  LMP 06/09/2014 Physical Exam  Nursing note and vitals reviewed. Constitutional: She appears well-developed and well-nourished. No distress.  HENT:  Head: Normocephalic and atraumatic.  Eyes: Pupils are equal, round, and reactive to light.  Neck: Normal range of motion. Neck supple.  Cardiovascular: Normal rate and regular rhythm.   Pulmonary/Chest: Effort normal. She exhibits tenderness. She exhibits no bony tenderness, no crepitus, no deformity and no retraction.    Abdominal: Soft.  Neurological: She is alert.  Skin: Skin is warm and dry.    ED Course  Procedures (including critical care time) Labs Review Labs Reviewed - No data to display  Imaging Review Dg Chest 2 View  06/10/2014   CLINICAL DATA:  Chest pain.  EXAM: CHEST  2 VIEW  COMPARISON:  02/04/2014  FINDINGS: The heart size and mediastinal contours are within normal limits. Both lungs are clear. The visualized skeletal structures are unremarkable.  IMPRESSION: No active cardiopulmonary disease.   Electronically Signed   By: Earle Gell M.D.   On: 06/10/2014 19:09   Ct Angio Chest Pe W/cm &/or Wo Cm  06/11/2014   CLINICAL DATA:  Chest pain. Back and left arm pain. Mildly elevated D-dimer.  EXAM: CT ANGIOGRAPHY CHEST WITH CONTRAST  TECHNIQUE: Multidetector CT imaging of the chest was performed using the standard protocol during bolus administration of intravenous contrast. Multiplanar CT image reconstructions and MIPs were obtained to evaluate the vascular anatomy.  CONTRAST:  19mL OMNIPAQUE IOHEXOL 350 MG/ML SOLN  COMPARISON:  Chest radiograph performed 06/10/2014, and CTA of the chest performed 02/04/2014  FINDINGS: There is no evidence of pulmonary embolus.  A tiny bleb is noted at the inferior aspect of the right upper lobe. Minimal left basilar  atelectasis is noted. The lungs are otherwise clear. There is no evidence of significant focal consolidation, pleural effusion or pneumothorax. No masses are identified; no abnormal focal contrast enhancement is seen.  The mediastinum is unremarkable in appearance. No mediastinal lymphadenopathy is seen. No pericardial effusions identified. The great vessels are grossly unremarkable in appearance, though difficult to fully assess due to the phase of contrast enhancement. No axillary lymphadenopathy is seen. The visualized portions of the thyroid gland are unremarkable in appearance.  The visualized portions of the liver and spleen are unremarkable.  No acute osseous abnormalities are seen.  Review of the MIP images confirms the above findings.  IMPRESSION: 1. No evidence of pulmonary embolus. 2. Minimal left basilar atelectasis noted; tiny right upper lobe bleb  noted. Lungs otherwise clear.   Electronically Signed   By: Garald Balding M.D.   On: 06/11/2014 02:37     EKG Interpretation   Date/Time:  Saturday June 10 2014 23:57:58 EDT Ventricular Rate:  81 PR Interval:  222 QRS Duration: 67 QT Interval:  364 QTC Calculation: 422 R Axis:   81 Text Interpretation:  Sinus rhythm Prolonged PR interval RSR' in V1 or V2,  probably normal variant No significant change was found Confirmed by  CAMPOS  MD, KEVIN ([id number]) on 06/11/2014 12:04:24 AM      MDM   Final diagnoses:  Chest wall pain    Medications  albuterol (PROVENTIL HFA;VENTOLIN HFA) 108 (90 BASE) MCG/ACT inhaler 2 puff (not administered)  morphine 4 MG/ML injection 4 mg (4 mg Intravenous Given 06/11/14 0219)  ondansetron (ZOFRAN) injection 4 mg (4 mg Intravenous Given 06/11/14 0219)  iohexol (OMNIPAQUE) 350 MG/ML injection 100 mL (100 mLs Intravenous Contrast Given 06/11/14 0224)   Asians pain controlled with IV pain medications. Her CT scan has come back showing no pulmonary embolisms or any other pathology concerning for life-threatening  problems. She is a primary care Dr. that she needs to followup with. She has been written for a prescription of Ultram and reassured her that her chest pain is most likely not related to her heart or blood vessels in her chest.  27 y.o.[name]'s evaluation in the Emergency Department is complete. It has been determined that no acute conditions requiring further emergency intervention are present at this time. The patient/guardian have been advised of the diagnosis and plan. We have discussed signs and symptoms that warrant return to the ED, such as changes or worsening in symptoms.  Vital signs are stable at discharge. Filed Vitals:   06/10/14 2359  BP: 134/83  Pulse: 80  Temp: 98.7 F (37.1 C)  Resp: 18    Patient/guardian has voiced understanding and agreed to follow-up with the PCP or specialist.     Linus Mako, PA-C 06/11/14 0245

## 2014-06-11 NOTE — ED Provider Notes (Signed)
Medical screening examination/treatment/procedure(s) were performed by non-physician practitioner and as supervising physician I was immediately available for consultation/collaboration.   EKG Interpretation   Date/Time:  Saturday June 10 2014 23:57:58 EDT Ventricular Rate:  81 PR Interval:  222 QRS Duration: 67 QT Interval:  364 QTC Calculation: 422 R Axis:   81 Text Interpretation:  Sinus rhythm Prolonged PR interval RSR' in V1 or V2,  probably normal variant No significant change was found Confirmed by  CAMPOS  MD, KEVIN ([id number]) on 06/11/2014 12:04:24 AM       Kalman Drape, MD 06/11/14 [phone]

## 2014-06-11 NOTE — ED Notes (Signed)
Patient states she was seen at [facility] for chest pain. Patient states she left AMA due to the wait time after lab draw. Patient states she was called after leaving and told she had a blood clot and needed to come in and be seen for further evaluation.

## 2014-06-11 NOTE — Discharge Instructions (Signed)

## 2014-06-15 ENCOUNTER — Encounter: Payer: Self-pay | Admitting: Internal Medicine

## 2014-06-15 ENCOUNTER — Ambulatory Visit: Payer: Medicaid Other | Attending: Internal Medicine | Admitting: Internal Medicine

## 2014-06-15 VITALS — BP 96/60 | HR 115 | Temp 98.4°F | Resp 20 | Ht 70.0 in | Wt 134.2 lb

## 2014-06-15 DIAGNOSIS — Z87891 Personal history of nicotine dependence: Secondary | ICD-10-CM | POA: Insufficient documentation

## 2014-06-15 DIAGNOSIS — F430 Acute stress reaction: Secondary | ICD-10-CM | POA: Insufficient documentation

## 2014-06-15 DIAGNOSIS — R05 Cough: Secondary | ICD-10-CM | POA: Diagnosis present

## 2014-06-15 DIAGNOSIS — J45909 Unspecified asthma, uncomplicated: Secondary | ICD-10-CM | POA: Diagnosis not present

## 2014-06-15 DIAGNOSIS — I10 Essential (primary) hypertension: Secondary | ICD-10-CM | POA: Diagnosis not present

## 2014-06-15 DIAGNOSIS — R059 Cough, unspecified: Secondary | ICD-10-CM | POA: Insufficient documentation

## 2014-06-15 DIAGNOSIS — R569 Unspecified convulsions: Secondary | ICD-10-CM | POA: Insufficient documentation

## 2014-06-15 DIAGNOSIS — F329 Major depressive disorder, single episode, unspecified: Secondary | ICD-10-CM | POA: Insufficient documentation

## 2014-06-15 DIAGNOSIS — D571 Sickle-cell disease without crisis: Secondary | ICD-10-CM | POA: Diagnosis not present

## 2014-06-15 DIAGNOSIS — F410 Panic disorder [episodic paroxysmal anxiety] without agoraphobia: Secondary | ICD-10-CM | POA: Insufficient documentation

## 2014-06-15 DIAGNOSIS — R079 Chest pain, unspecified: Secondary | ICD-10-CM | POA: Insufficient documentation

## 2014-06-15 DIAGNOSIS — F3289 Other specified depressive episodes: Secondary | ICD-10-CM | POA: Diagnosis not present

## 2014-06-15 MED ORDER — AMOXICILLIN 500 MG PO CAPS: 500.0000 mg | ORAL_CAPSULE | Freq: Three times a day (TID) | ORAL | Status: DC

## 2014-06-15 MED ORDER — PREDNISONE 20 MG PO TABS: 20.0000 mg | ORAL_TABLET | Freq: Every day | ORAL | Status: DC

## 2014-06-15 NOTE — Progress Notes (Signed)
Patient ID: [name], female   DOB: 1986-06-05, 28 y.o.   MRN: [id number]  CC: ED follow up   HPI:  Patient reports that four days ago she was evaluated for sharp chest pain that radiated to her left arm and neck.  Since then she reports resolution of chest pain.  She now reports that she is coughing up yellow phelgm with small specks of blood since Tuesday.  She denies fevers, chills, headaches, sinus congestion, or rhinitis.   Allergies  Allergen Reactions  . Aspirin Hives   Past Medical History  Diagnosis Date  . Sickle cell anemia   . Seizures   . Depression   . Panic attack   . Hypertension   . Heart murmur   . Arrhythmia   . PTSD (post-traumatic stress disorder)    Current Outpatient Prescriptions on File Prior to Visit  Medication Sig Dispense Refill  . albuterol (PROVENTIL HFA;VENTOLIN HFA) 108 (90 BASE) MCG/ACT inhaler Inhale 2 puffs into the lungs every 4 (four) hours as needed for wheezing or shortness of breath.  1 Inhaler  0  . atenolol (TENORMIN) 50 MG tablet Take 50 mg by mouth daily.      . calcium carbonate (TUMS - DOSED IN MG ELEMENTAL CALCIUM) 500 MG chewable tablet Chew 1-3 tablets by mouth as needed for indigestion or heartburn.      . clonazePAM (KLONOPIN) 0.5 MG tablet Take 0.5 mg by mouth daily as needed for anxiety.      . gabapentin (NEURONTIN) 300 MG capsule Take 300 mg by mouth 3 (three) times daily.      . naproxen (NAPROSYN) 500 MG tablet Take 1 tablet (500 mg total) by mouth 2 (two) times daily with a meal.  60 tablet  2  . omeprazole (PRILOSEC) 40 MG capsule Take 1 capsule (40 mg total) by mouth daily.  30 capsule  3  . pregabalin (LYRICA) 75 MG capsule Take 1 capsule (75 mg total) by mouth 2 (two) times daily.  60 capsule  3  . promethazine (PHENERGAN) 12.5 MG tablet Take 1 tablet (12.5 mg total) by mouth every 6 (six) hours as needed for nausea or vomiting.  30 tablet  0  . traMADol (ULTRAM) 50 MG tablet Take 1 tablet (50 mg total) by mouth every  6 (six) hours as needed.  15 tablet  0  . zolpidem (AMBIEN) 10 MG tablet Take 10 mg by mouth at bedtime as needed for sleep.      Marland Kitchen albuterol (PROVENTIL HFA;VENTOLIN HFA) 108 (90 BASE) MCG/ACT inhaler Inhale 1-2 puffs into the lungs every 6 (six) hours as needed for wheezing or shortness of breath.  1 Inhaler  0   No current facility-administered medications on file prior to visit.   Family History  Problem Relation Age of Onset  . Heart attack Mother   . Heart disease Mother   . Sudden death Mother   . Sudden death Sister   . Heart attack Sister   . Sudden death Maternal Grandmother   . Fainting Maternal Grandmother    History   Social History  . Marital Status: Single    Spouse Name: N/A    Number of Children: N/A  . Years of Education: N/A   Occupational History  . Not on file.   Social History Main Topics  . Smoking status: Former Smoker    Quit date: 06/23/2012  . Smokeless tobacco: Never Used  . Alcohol Use: Yes     Comment:  ocassionally  . Drug Use: Yes    Special: Marijuana  . Sexual Activity: Not on file   Other Topics Concern  . Not on file   Social History Narrative   Patient is single, currently raising her nephews   Patient is right handed   Patient's education level is high school graduate   Patient doesn't drink caffeine         Review of Systems  Constitutional: Positive for chills. Negative for fever.  HENT: Positive for sore throat (with coughing only).        PND   Eyes: Negative.   Cardiovascular: Positive for chest pain (colicky).  Gastrointestinal: Negative.   Neurological: Positive for weakness and headaches. Negative for dizziness and tingling.   .    Objective:   Filed Vitals:   06/15/14 1232  BP: 96/60  Pulse: 115  Temp: 98.4 F (36.9 C)  Resp: 20   Physical Exam  HENT:  Right Ear: External ear normal.  Left Ear: External ear normal.  Mouth/Throat: Oropharynx is clear and moist.  Eyes: Conjunctivae are normal. Right  eye exhibits no discharge. Left eye exhibits no discharge.  Neck: Normal range of motion.  Cardiovascular: Normal rate, regular rhythm and normal heart sounds.   Pulmonary/Chest: Effort normal and breath sounds normal.  Abdominal: Soft. Bowel sounds are normal.  Lymphadenopathy:    She has no cervical adenopathy.  '  Lab Results  Component Value Date   WBC 5.3 06/10/2014   HGB 11.6* 06/10/2014   HCT 32.8* 06/10/2014   MCV 88.2 06/10/2014   PLT 243 06/10/2014   Lab Results  Component Value Date   CREATININE 0.68 06/10/2014   BUN 11 06/10/2014   NA 142 06/10/2014   K 4.1 06/10/2014   CL 102 06/10/2014   CO2 26 06/10/2014    No results found for this basename: HGBA1C   Lipid Panel     Component Value Date/Time   CHOL 109 06/07/2012 0444   TRIG 61 06/07/2012 0444   HDL 38* 06/07/2012 0444   CHOLHDL 2.9 06/07/2012 0444   VLDL 12 06/07/2012 0444   LDLCALC 59 06/07/2012 0444       Assessment and plan:   Angelly was seen today for chest pain.  Diagnoses and associated orders for this visit:  Unspecified asthma(493.90) - predniSONE (DELTASONE) 20 MG tablet; Take 1 tablet (20 mg total) by mouth daily with breakfast. - amoxicillin (AMOXIL) 500 MG capsule; Take 1 capsule (500 mg total) by mouth 3 (three) times daily.  Watch and wait method with amoxicillin. Patient may take if no better in 3-4 days    Return if symptoms worsen or fail to improve.  Chari Manning, [facility] [phone] 06/19/2014, 9:42 PM

## 2014-06-15 NOTE — Patient Instructions (Signed)
Cough, Adult  A cough is a reflex that helps clear your throat and airways. It can help heal the body or may be a reaction to an irritated airway. A cough may only last 2 or 3 weeks (acute) or may last more than 8 weeks (chronic).  CAUSES Acute cough:  Viral or bacterial infections. Chronic cough:  Infections.  Allergies.  Asthma.  Post-nasal drip.  Smoking.  Heartburn or acid reflux.  Some medicines.  Chronic lung problems (COPD).  Cancer. SYMPTOMS   Cough.  Fever.  Chest pain.  Increased breathing rate.  High-pitched whistling sound when breathing (wheezing).  Colored mucus that you cough up (sputum). TREATMENT   A bacterial cough may be treated with antibiotic medicine.  A viral cough must run its course and will not respond to antibiotics.  Your caregiver may recommend other treatments if you have a chronic cough. HOME CARE INSTRUCTIONS   Only take over-the-counter or prescription medicines for pain, discomfort, or fever as directed by your caregiver. Use cough suppressants only as directed by your caregiver.  Use a cold steam vaporizer or humidifier in your bedroom or home to help loosen secretions.  Sleep in a semi-upright position if your cough is worse at night.  Rest as needed.  Stop smoking if you smoke. SEEK IMMEDIATE MEDICAL CARE IF:   You have pus in your sputum.  Your cough starts to worsen.  You cannot control your cough with suppressants and are losing sleep.  You begin coughing up blood.  You have difficulty breathing.  You develop pain which is getting worse or is uncontrolled with medicine.  You have a fever. MAKE SURE YOU:   Understand these instructions.  Will watch your condition.  Will get help right away if you are not doing well or get worse. Document Released: 05/09/2011 Document Revised: 02/02/2012 Document Reviewed: 05/09/2011 Exit[employer] Patient Information 2015 [employer]. This information is not intended  to replace advice given to you by your health care provider. Make sure you discuss any questions you have with your health care provider.  

## 2014-06-15 NOTE — Progress Notes (Signed)
Patient presents for f/u chest pain Was seen in ED 4 days ago and ECG was done States chest pain is now intermittent vs constant when in ED  States started with hemoptysis and sore throat yesterday

## 2014-06-15 NOTE — ED Provider Notes (Signed)
CSN: [id number]     Arrival date & time 06/10/14  1815 History   First MD Initiated Contact with Patient 06/10/14 2022     Chief Complaint  Patient presents with  . Chest Pain     (Consider location/radiation/quality/duration/timing/severity/associated sxs/prior Treatment) HPI  28 year old female with chest pain. Pain is left-sided radiates into her back. Associated with some numbness and left upper extremity. Onset earlier today. Pain is worse with deep inspiration. No shortness of breath. She endorses palpitations. The feeling that her as fluttering. Patient has been evaluated by cardiology several times in the past. She carries a diagnosis of POTS. She does endorse some dizziness. No fevers or chills. No unusual leg pain or swelling. No recent surgical procedures or prolonged immobilization. Denies any exogenous estrogen usage. No history of DVT/PE.  Past Medical History  Diagnosis Date  . Sickle cell anemia   . Seizures   . Depression   . Panic attack   . Hypertension   . Heart murmur   . Arrhythmia   . PTSD (post-traumatic stress disorder)    History reviewed. No pertinent past surgical history. Family History  Problem Relation Age of Onset  . Heart attack Mother   . Heart disease Mother   . Sudden death Mother   . Sudden death Sister   . Heart attack Sister   . Sudden death Maternal Grandmother   . Fainting Maternal Grandmother    History  Substance Use Topics  . Smoking status: Former Smoker    Quit date: 06/23/2012  . Smokeless tobacco: Never Used  . Alcohol Use: Yes     Comment: ocassionally   OB History   Grav Para Term Preterm Abortions TAB SAB Ect Mult Living                 Review of Systems  All systems reviewed and negative, other than as noted in HPI.   Allergies  Aspirin  Home Medications   Prior to Admission medications   Medication Sig Start Date End Date Taking? Authorizing Provider  atenolol (TENORMIN) 50 MG tablet Take 50 mg by mouth  daily.   Yes Historical Provider, MD  calcium carbonate (TUMS - DOSED IN MG ELEMENTAL CALCIUM) 500 MG chewable tablet Chew 1-3 tablets by mouth as needed for indigestion or heartburn.   Yes Historical Provider, MD  clonazePAM (KLONOPIN) 0.5 MG tablet Take 0.5 mg by mouth daily as needed for anxiety.   Yes Historical Provider, MD  gabapentin (NEURONTIN) 300 MG capsule Take 300 mg by mouth 3 (three) times daily.   Yes Historical Provider, MD  naproxen (NAPROSYN) 500 MG tablet Take 1 tablet (500 mg total) by mouth 2 (two) times daily with a meal. 05/08/14  Yes Lance Bosch, NP  omeprazole (PRILOSEC) 40 MG capsule Take 1 capsule (40 mg total) by mouth daily. 03/29/14  Yes Willia Craze, NP  pregabalin (LYRICA) 75 MG capsule Take 1 capsule (75 mg total) by mouth 2 (two) times daily. 05/10/14  Yes Hulen Luster, DO  promethazine (PHENERGAN) 12.5 MG tablet Take 1 tablet (12.5 mg total) by mouth every 6 (six) hours as needed for nausea or vomiting. 03/29/14  Yes Willia Craze, NP  zolpidem (AMBIEN) 10 MG tablet Take 10 mg by mouth at bedtime as needed for sleep.   Yes Historical Provider, MD  albuterol (PROVENTIL HFA;VENTOLIN HFA) 108 (90 BASE) MCG/ACT inhaler Inhale 2 puffs into the lungs every 4 (four) hours as needed for wheezing or shortness  of breath. 06/11/14   Carlisle Cater, PA-C  albuterol (PROVENTIL HFA;VENTOLIN HFA) 108 (90 BASE) MCG/ACT inhaler Inhale 1-2 puffs into the lungs every 6 (six) hours as needed for wheezing or shortness of breath. 06/11/14   Kaitlyn Szekalski, PA-C  traMADol (ULTRAM) 50 MG tablet Take 1 tablet (50 mg total) by mouth every 6 (six) hours as needed. 06/11/14   Tiffany Marilu Favre, PA-C   BP 144/94  Pulse 64  Temp(Src) 98.6 F (37 C) (Oral)  Resp 18  Ht 5\' 10"  (1.778 m)  Wt 140 lb (63.504 kg)  BMI 20.09 kg/m2  SpO2 98%  LMP 06/09/2014 Physical Exam  Nursing note and vitals reviewed. Constitutional: She appears well-developed and well-nourished. No distress.   HENT:  Head: Normocephalic and atraumatic.  Eyes: Conjunctivae are normal. Right eye exhibits no discharge. Left eye exhibits no discharge.  Neck: Neck supple.  Cardiovascular: Normal rate, regular rhythm and normal heart sounds.  Exam reveals no gallop and no friction rub.   No murmur heard. Pulmonary/Chest: Effort normal and breath sounds normal. No respiratory distress.  Abdominal: Soft. She exhibits no distension. There is no tenderness.  Musculoskeletal: She exhibits no edema and no tenderness.  Lower extremities symmetric as compared to each other. No calf tenderness. Negative Homan's. No palpable cords.   Neurological: She is alert.  Skin: Skin is warm and dry.  Psychiatric: She has a normal mood and affect. Her behavior is normal. Thought content normal.    ED Course  Procedures (including critical care time) Labs Review Labs Reviewed  CBC - Abnormal; Notable for the following:    RBC 3.72 (*)    Hemoglobin 11.6 (*)    HCT 32.8 (*)    All other components within normal limits  D-DIMER, QUANTITATIVE - Abnormal; Notable for the following:    D-Dimer, Quant 0.51 (*)    All other components within normal limits  BASIC METABOLIC PANEL  TROPONIN I  POC URINE PREG, ED    Imaging Review No results found.   EKG Interpretation   Date/Time:  Saturday June 10 2014 18:18:32 EDT Ventricular Rate:  77 PR Interval:  240 QRS Duration: 76 QT Interval:  368 QTC Calculation: 416 R Axis:   82 Text Interpretation:  Sinus rhythm with 1st degree A-V block No  significant change since No significant change since last tracing  Confirmed by [place] ([id number]) on 06/10/2014 8:31:00 PM      MDM   Final diagnoses:  None    28 year old female with chest pain. Patient has been evaluated by cardiology in the past his chest pain palpitations. The patient describes as pleuritic in nature. This work was pretty unremarkable. Discussed with patient that additional 100 or greater  d-dimer. This was drawn, but patient subsequently left against medical device prior to being resulted. She is in no acute distress. Hemodynamically stable.    Virgel Manifold, MD 06/15/14 [id number]

## 2014-06-22 ENCOUNTER — Other Ambulatory Visit: Payer: Self-pay | Admitting: Obstetrics & Gynecology

## 2014-06-22 ENCOUNTER — Ambulatory Visit (INDEPENDENT_AMBULATORY_CARE_PROVIDER_SITE_OTHER): Payer: Medicaid Other | Admitting: Obstetrics & Gynecology

## 2014-06-22 ENCOUNTER — Encounter: Payer: Self-pay | Admitting: Obstetrics & Gynecology

## 2014-06-22 VITALS — BP 118/84 | HR 87 | Temp 98.9°F | Ht 70.0 in | Wt 134.7 lb

## 2014-06-22 DIAGNOSIS — D582 Other hemoglobinopathies: Secondary | ICD-10-CM

## 2014-06-22 DIAGNOSIS — N949 Unspecified condition associated with female genital organs and menstrual cycle: Secondary | ICD-10-CM

## 2014-06-22 DIAGNOSIS — R102 Pelvic and perineal pain: Principal | ICD-10-CM

## 2014-06-22 DIAGNOSIS — F450 Somatization disorder: Secondary | ICD-10-CM

## 2014-06-22 DIAGNOSIS — G8929 Other chronic pain: Secondary | ICD-10-CM

## 2014-06-22 DIAGNOSIS — F1910 Other psychoactive substance abuse, uncomplicated: Secondary | ICD-10-CM

## 2014-06-22 HISTORY — DX: Other hemoglobinopathies: D58.2

## 2014-06-22 NOTE — Progress Notes (Signed)
   CLINIC ENCOUNTER NOTE  History:  28 y.o. G0 with history of diffuse musculoskeletal pain, narcotic dependence and multiple ER visits for pain here today for evaluation of pelvic pain reported to her PCP on a visit in 05/08/14.  Reports a history of ovarian cyst a few years ago. She reports the pain is intermittent, sharp and shooting for several months. Pain is worsened by sleeping, standing, sitting. Only pain medications alleviate the pain.  Pain not related to menses. Her last pap was within three years and it was normal. Patient denies unprotected sex, dyspareunia, dysuria, abnormal bleeding, discharge, odor, or itch.  Of note, her PMH showed sickle cell anemia but she had a negative sickle cell screen on 03/08/14; I removed this from her history pending a hemoglobinopathy evaluation. Patient reports her father had sickle cell anemia and her mother had the trait; and that she was diagnosed in [place] in 2005.  The following portions of the patient's history were reviewed and updated as appropriate: allergies, current medications, past family history, past medical history, past social history, past surgical history and problem list.  Review of Systems:  Pertinent items are noted in HPI.  Objective:  Physical Exam BP 118/84  Pulse 87  Temp(Src) 98.9 F (37.2 C) (Oral)  Ht 5\' 10"  (1.778 m)  Wt 134 lb 11.2 oz (61.1 kg)  BMI 19.33 kg/m2  LMP 06/09/2014 Gen: NAD Abd: Soft, diffuse lower abdominal tenderness, voluntary guarding, no rebound, nondistended Pelvic: Normal appearing external genitalia; normal appearing vaginal mucosa and cervix.  Normal discharge.  Small uterus, no other palpable masses. Diffuse tenderness during exam.   Assessment & Plan:  Will obtain pelvic ultrasound. Pelvic cultures done, will follow up results Hemoglobin electrophoresis ordered, will follow up results and manage accordingly. Continue with regularly prescribed pain medications.     Verita Schneiders, MD, [place] Attending [place] for [employer], [place]

## 2014-06-22 NOTE — Patient Instructions (Signed)
Return to clinic for any scheduled appointments or for any gynecologic concerns as needed.   

## 2014-06-23 LAB — GC/CHLAMYDIA PROBE AMP
CT Probe RNA: NEGATIVE
GC Probe RNA: NEGATIVE

## 2014-06-23 LAB — WET PREP, GENITAL
Clue Cells Wet Prep HPF POC: NONE SEEN
Trich, Wet Prep: NONE SEEN
WBC, Wet Prep HPF POC: NONE SEEN
Yeast Wet Prep HPF POC: NONE SEEN

## 2014-06-23 LAB — HIV ANTIBODY (ROUTINE TESTING W REFLEX): HIV 1&2 Ab, 4th Generation: NONREACTIVE

## 2014-06-23 LAB — HEMOGLOBINOPATHY EVALUATION
Hemoglobin Other: 31.6 % — ABNORMAL HIGH
Hgb A2 Quant: 2.9 % (ref 2.2–3.2)
Hgb A: 65.5 % — ABNORMAL LOW (ref 96.8–97.8)
Hgb F Quant: 0 % (ref 0.0–2.0)
Hgb S Quant: 0 %

## 2014-06-26 ENCOUNTER — Ambulatory Visit: Payer: Medicaid Other | Admitting: Internal Medicine

## 2014-06-28 LAB — HGB ELECTROPHORESIS REFLEXED REPORT
Hemoglobin A - HGBRFX: 60.3 % — ABNORMAL LOW (ref >96.0)
Hemoglobin A2 - HGBRFX: 3.1 % (ref 1.8–3.5)
Hemoglobin Elect C: 36.4 % — ABNORMAL HIGH
Hemoglobin F - HGBRFX: 0.2 % (ref <2.0)
Sickle Solubility Test - HGBRFX: NEGATIVE

## 2014-06-29 ENCOUNTER — Ambulatory Visit (HOSPITAL_COMMUNITY)
Admission: RE | Admit: 2014-06-29 | Discharge: 2014-06-29 | Disposition: A | Discharge status: Home / Self Care | Payer: Medicaid Other | Source: Ambulatory Visit | Attending: Obstetrics & Gynecology | Admitting: Obstetrics & Gynecology

## 2014-06-29 DIAGNOSIS — N83209 Unspecified ovarian cyst, unspecified side: Secondary | ICD-10-CM | POA: Insufficient documentation

## 2014-06-29 DIAGNOSIS — G8929 Other chronic pain: Secondary | ICD-10-CM | POA: Insufficient documentation

## 2014-06-29 DIAGNOSIS — N949 Unspecified condition associated with female genital organs and menstrual cycle: Secondary | ICD-10-CM | POA: Insufficient documentation

## 2014-06-29 DIAGNOSIS — R102 Pelvic and perineal pain: Secondary | ICD-10-CM

## 2014-07-03 ENCOUNTER — Telehealth: Payer: Self-pay | Admitting: General Practice

## 2014-07-03 ENCOUNTER — Encounter: Payer: Self-pay | Admitting: Obstetrics & Gynecology

## 2014-07-03 DIAGNOSIS — D582 Other hemoglobinopathies: Secondary | ICD-10-CM | POA: Insufficient documentation

## 2014-07-03 NOTE — Telephone Encounter (Signed)
Called patient and informed her of results. Patient verbalized understanding and asked where her pain could be coming from then. Told patient it looks like the cultures we did were negative as well so I couldn't speak to that but she should follow the plan her and Dr Harolyn Rutherford discussed. Patient verbalized understanding and had no other questions

## 2014-07-03 NOTE — Telephone Encounter (Signed)
Message copied by Shelly Coss on Mon Jul 03, 2014  9:26 AM ------      Message from: Verita Schneiders A      Created: Mon Jul 03, 2014  9:12 AM       Normal pelvic ultrasound. Please call to inform patient of results.       ------

## 2014-07-19 ENCOUNTER — Other Ambulatory Visit: Payer: Self-pay | Admitting: Internal Medicine

## 2014-08-01 ENCOUNTER — Telehealth: Payer: Self-pay | Admitting: *Deleted

## 2014-08-01 ENCOUNTER — Telehealth: Payer: Self-pay | Admitting: Internal Medicine

## 2014-08-01 NOTE — Telephone Encounter (Signed)
I spoke to the pt and I encouraged her to use Ice one night and Heat the next night. Her next appointment is on the 10th. I told her she could discuss her treatment options with the doctor on that day.

## 2014-08-01 NOTE — Telephone Encounter (Signed)
Pt. Calling because she is in a lot of back pain and would like to know what to do next. Pt's next office visit is on 08/03/14.Please f/u with pt.

## 2014-08-03 ENCOUNTER — Encounter: Payer: Self-pay | Admitting: Internal Medicine

## 2014-08-03 ENCOUNTER — Ambulatory Visit: Payer: Medicaid Other | Attending: Internal Medicine | Admitting: Internal Medicine

## 2014-08-03 VITALS — BP 112/78 | HR 91 | Temp 98.4°F | Resp 16 | Ht 70.0 in | Wt 140.0 lb

## 2014-08-03 DIAGNOSIS — F4542 Pain disorder with related psychological factors: Secondary | ICD-10-CM | POA: Diagnosis not present

## 2014-08-03 DIAGNOSIS — F1010 Alcohol abuse, uncomplicated: Secondary | ICD-10-CM | POA: Insufficient documentation

## 2014-08-03 DIAGNOSIS — G8929 Other chronic pain: Secondary | ICD-10-CM | POA: Diagnosis not present

## 2014-08-03 DIAGNOSIS — Z2821 Immunization not carried out because of patient refusal: Secondary | ICD-10-CM | POA: Insufficient documentation

## 2014-08-03 DIAGNOSIS — F450 Somatization disorder: Secondary | ICD-10-CM | POA: Insufficient documentation

## 2014-08-03 DIAGNOSIS — Z87891 Personal history of nicotine dependence: Secondary | ICD-10-CM | POA: Insufficient documentation

## 2014-08-03 DIAGNOSIS — R52 Pain, unspecified: Secondary | ICD-10-CM | POA: Diagnosis present

## 2014-08-03 DIAGNOSIS — Z79899 Other long term (current) drug therapy: Secondary | ICD-10-CM | POA: Diagnosis not present

## 2014-08-03 DIAGNOSIS — F4310 Post-traumatic stress disorder, unspecified: Secondary | ICD-10-CM | POA: Insufficient documentation

## 2014-08-03 DIAGNOSIS — F609 Personality disorder, unspecified: Secondary | ICD-10-CM | POA: Diagnosis not present

## 2014-08-03 DIAGNOSIS — M549 Dorsalgia, unspecified: Secondary | ICD-10-CM

## 2014-08-03 DIAGNOSIS — I10 Essential (primary) hypertension: Secondary | ICD-10-CM | POA: Diagnosis not present

## 2014-08-03 DIAGNOSIS — F410 Panic disorder [episodic paroxysmal anxiety] without agoraphobia: Secondary | ICD-10-CM | POA: Insufficient documentation

## 2014-08-03 DIAGNOSIS — F319 Bipolar disorder, unspecified: Secondary | ICD-10-CM | POA: Insufficient documentation

## 2014-08-03 DIAGNOSIS — G40909 Epilepsy, unspecified, not intractable, without status epilepticus: Secondary | ICD-10-CM | POA: Diagnosis not present

## 2014-08-03 DIAGNOSIS — R011 Cardiac murmur, unspecified: Secondary | ICD-10-CM | POA: Diagnosis not present

## 2014-08-03 DIAGNOSIS — I499 Cardiac arrhythmia, unspecified: Secondary | ICD-10-CM | POA: Diagnosis not present

## 2014-08-03 DIAGNOSIS — Z888 Allergy status to other drugs, medicaments and biological substances status: Secondary | ICD-10-CM | POA: Diagnosis not present

## 2014-08-03 DIAGNOSIS — F1210 Cannabis abuse, uncomplicated: Secondary | ICD-10-CM | POA: Insufficient documentation

## 2014-08-03 MED ORDER — TRAMADOL HCL 50 MG PO TABS: 50.0000 mg | ORAL_TABLET | Freq: Two times a day (BID) | ORAL | Status: DC | PRN

## 2014-08-03 NOTE — Patient Instructions (Signed)
We will send in referral on Monday and the pain clinic will call you with a appointment. If you have not heard anything in 2 weeks may call office. Thanks.

## 2014-08-03 NOTE — Progress Notes (Signed)
Pt is here following up on her chronic pain in her lower back.

## 2014-08-03 NOTE — Progress Notes (Signed)
Patient ID: [name], female   DOB: 1986/04/30, 28 y.o.   MRN: [id number]  CC: Generalized pain  HPI:  Patient reports that she has been to several doctors and the ER for multiple c/o.  She reports that she went to the GYN and was told that everything is normal.  She reports that she was evaluated by psychiatry and was told that depression may cause pain.  She is being evaluated by [facility]. She states that she has been on Zoloft and Zyprexa in the past but was taken off the medication a while back for some reason.  She would like to be evaluated by the spine center at [facility] for chronic pain. The pain is described as feeling like the skin is falling off her bones that is aggravated by someone touching her skin.  She meets with a therapist twice weekly and is in a outreach program that does daily prayer.  She is currently taking ambien, klonazapam, and gabapentin.    Allergies  Allergen Reactions  . Aspirin Hives   Past Medical History  Diagnosis Date  . Seizures   . Depression   . Panic attack   . Hypertension   . Heart murmur   . Arrhythmia   . PTSD (post-traumatic stress disorder)   . 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 albuterol (PROVENTIL HFA;VENTOLIN HFA) 108 (90 BASE) MCG/ACT inhaler Inhale 2 puffs into the lungs every 4 (four) hours as needed for wheezing or shortness of breath.  1 Inhaler  0  . albuterol (PROVENTIL HFA;VENTOLIN HFA) 108 (90 BASE) MCG/ACT inhaler Inhale 1-2 puffs into the lungs every 6 (six) hours as needed for wheezing or shortness of breath.  1 Inhaler  0  . amoxicillin (AMOXIL) 500 MG capsule Take 1 capsule (500 mg total) by mouth 3 (three) times daily.  15 capsule  0  . atenolol (TENORMIN) 50 MG tablet Take 50 mg by mouth daily.      . calcium carbonate (TUMS - DOSED IN MG ELEMENTAL CALCIUM) 500 MG chewable tablet Chew 1-3 tablets by mouth as  needed for indigestion or heartburn.      . clonazePAM (KLONOPIN) 0.5 MG tablet Take 0.5 mg by mouth daily as needed for anxiety.      . gabapentin (NEURONTIN) 300 MG capsule Take 300 mg by mouth 3 (three) times daily.      . naproxen (NAPROSYN) 500 MG tablet TAKE 1 TABLET BY MOUTH 2 TIMES DAILY WITH A MEAL.  60 tablet  2  . omeprazole (PRILOSEC) 40 MG capsule Take 1 capsule (40 mg total) by mouth daily.  30 capsule  3  . predniSONE (DELTASONE) 20 MG tablet Take 1 tablet (20 mg total) by mouth daily with breakfast.  5 tablet  0  . pregabalin (LYRICA) 75 MG capsule Take 1 capsule (75 mg total) by mouth 2 (two) times daily.  60 capsule  3  . promethazine (PHENERGAN) 12.5 MG tablet Take 1 tablet (12.5 mg total) by mouth every 6 (six) hours as needed for nausea or vomiting.  30 tablet  0  . traMADol (ULTRAM) 50 MG tablet Take 1 tablet (50 mg total) by mouth every 6 (six) hours as needed.  15 tablet  0  . zolpidem (AMBIEN) 10 MG tablet Take 10 mg by mouth at bedtime as needed for sleep.       No  current facility-administered medications on file prior to visit.   Family History  Problem Relation Age of Onset  . Heart attack Mother   . Heart disease Mother   . Sudden death Mother   . Sudden death Sister   . Heart attack Sister   . Sudden death Maternal Grandmother   . Fainting Maternal Grandmother    History   Social History  . Marital Status: Single    Spouse Name: N/A    Number of Children: N/A  . Years of Education: N/A   Occupational History  . Not on file.   Social History Main Topics  . Smoking status: Former Smoker    Quit date: 06/23/2012  . Smokeless tobacco: Never Used  . Alcohol Use: Yes     Comment: ocassionally  . Drug Use: Yes    Special: Marijuana  . Sexual Activity: Not on file   Other Topics Concern  . Not on file   Social History Narrative   Patient is single, currently raising her nephews   Patient is right handed   Patient's education level is high  school graduate   Patient doesn't drink caffeine          Review of Systems: See HPI   Objective:   Filed Vitals:   08/03/14 1136  BP: 112/78  Pulse: 91  Temp: 98.4 F (36.9 C)  Resp: 16   Physical Exam  Constitutional: She is oriented to person, place, and time.  Cardiovascular: Normal rate, regular rhythm and normal heart sounds.   Pulmonary/Chest: Effort normal and breath sounds normal.  Musculoskeletal: She exhibits tenderness (lower lumbar tenderness). She exhibits no edema.  Neurological: She is alert and oriented to person, place, and time. She has normal reflexes. No cranial nerve deficit.  Skin: Skin is warm and dry.  Psychiatric:  Anxious and tearful during history taking      Lab Results  Component Value Date   WBC 5.3 06/10/2014   HGB 11.6* 06/10/2014   HCT 32.8* 06/10/2014   MCV 88.2 06/10/2014   PLT 243 06/10/2014   Lab Results  Component Value Date   CREATININE 0.68 06/10/2014   BUN 11 06/10/2014   NA 142 06/10/2014   K 4.1 06/10/2014   CL 102 06/10/2014   CO2 26 06/10/2014    No results found for this basename: HGBA1C   Lipid Panel     Component Value Date/Time   CHOL 109 06/07/2012 0444   TRIG 61 06/07/2012 0444   HDL 38* 06/07/2012 0444   CHOLHDL 2.9 06/07/2012 0444   VLDL 12 06/07/2012 0444   LDLCALC 59 06/07/2012 0444       Assessment and plan:   [name] was seen today for follow-up.  Diagnoses and associated orders for this visit:  Chronic back pain - Ambulatory referral to Pain Clinic - traMADol (ULTRAM) 50 MG tablet; Take 1 tablet (50 mg total) by mouth every 12 (twelve) hours as needed.    Will try PT as well to try to manage back pain.  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 She has several c/o but no etiology to explain symptoms.   Refused influenza vaccine Stressed to patient the importance of early vaccination   Return if symptoms worsen or  fail to improve.   Chari Manning, [place] [facility] [phone] 08/18/2014, 6:39 PM

## 2014-08-16 ENCOUNTER — Encounter: Payer: Self-pay | Admitting: Internal Medicine

## 2014-08-16 ENCOUNTER — Ambulatory Visit (INDEPENDENT_AMBULATORY_CARE_PROVIDER_SITE_OTHER): Payer: Medicaid Other | Admitting: Internal Medicine

## 2014-08-16 VITALS — BP 134/83 | HR 82 | Ht 70.0 in | Wt 136.8 lb

## 2014-08-16 DIAGNOSIS — G901 Familial dysautonomia [Riley-Day]: Secondary | ICD-10-CM

## 2014-08-16 DIAGNOSIS — I341 Nonrheumatic mitral (valve) prolapse: Secondary | ICD-10-CM

## 2014-08-16 DIAGNOSIS — I059 Rheumatic mitral valve disease, unspecified: Secondary | ICD-10-CM

## 2014-08-16 DIAGNOSIS — G909 Disorder of the autonomic nervous system, unspecified: Secondary | ICD-10-CM

## 2014-08-16 MED ORDER — PROPRANOLOL HCL ER 80 MG PO CP24: 80.0000 mg | ORAL_CAPSULE | Freq: Every day | ORAL | Status: DC

## 2014-08-16 NOTE — Patient Instructions (Addendum)
Your physician recommends that you continue on your current medications as directed. Please refer to the Current Medication list given to you today.  Your physician has requested that you have an echocardiogram. Echocardiography is a painless test that uses sound waves to create images of your heart. It provides your doctor with information about the size and shape of your heart and how well your heart's chambers and valves are working. This procedure takes approximately one hour. There are no restrictions for this procedure.  Your physician wants you to follow-up in: January 2016 with Dr. Caryl Comes.  You will receive a reminder letter in the mail two months in advance. If you don't receive a letter, please call our office to schedule the follow-up appointment.  Horizontal exercises

## 2014-08-16 NOTE — Progress Notes (Signed)
Patient Care Team: Lance Bosch, NP as PCP - General (Internal Medicine)   HPI  [name] is a 28 y.o. female Seen in followup for palpitations that I thought was possibly dysautonomic;  that report confirmed by the event recorder to which demonstrated recurrent sinus tachycardia. She also been noted to have right bundle branch block/RAD ventricular ectopy  She struggles with PTSD and anxiety. 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 She is raising her 2 sons of her older sister.  There was a family of "cardiac arrest", however, these occurred in the context of cancer and dialysis.  She continues to significant problems with pain and fatigue. She also has intermittent tachycardia palpitations with exertion.  She has not pursued counseling  With her elevated blood pressure and sinus tach at her last visit we started on betablockers  Past Medical History  Diagnosis Date  . Seizures   . Depression   . Panic attack   . Hypertension   . Heart murmur   . Arrhythmia   . PTSD (post-traumatic stress disorder)   . 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 No pertinent past surgical history.  Current Outpatient Prescriptions  Medication Sig Dispense Refill  . albuterol (PROVENTIL HFA;VENTOLIN HFA) 108 (90 BASE) MCG/ACT inhaler Inhale 2 puffs into the lungs every 4 (four) hours as needed for wheezing or shortness of breath.  1 Inhaler  0  . albuterol (PROVENTIL HFA;VENTOLIN HFA) 108 (90 BASE) MCG/ACT inhaler Inhale 1-2 puffs into the lungs every 6 (six) hours as needed for wheezing or shortness of breath.  1 Inhaler  0  . atenolol (TENORMIN) 50 MG tablet Take 50 mg by mouth daily.      . calcium carbonate (TUMS - DOSED IN MG ELEMENTAL CALCIUM) 500 MG chewable tablet Chew 1-3 tablets by mouth as needed for indigestion or heartburn.      . clonazePAM (KLONOPIN) 0.5 MG tablet Take 0.5 mg by mouth daily as needed for anxiety.      Marland Kitchen omeprazole  (PRILOSEC) 40 MG capsule Take 1 capsule (40 mg total) by mouth daily.  30 capsule  3  . predniSONE (DELTASONE) 20 MG tablet Take 1 tablet (20 mg total) by mouth daily with breakfast.  5 tablet  0  . pregabalin (LYRICA) 75 MG capsule Take 1 capsule (75 mg total) by mouth 2 (two) times daily.  60 capsule  3  . promethazine (PHENERGAN) 12.5 MG tablet Take 1 tablet (12.5 mg total) by mouth every 6 (six) hours as needed for nausea or vomiting.  30 tablet  0  . traMADol (ULTRAM) 50 MG tablet Take 1 tablet (50 mg total) by mouth every 12 (twelve) hours as needed.  60 tablet  0  . zolpidem (AMBIEN) 10 MG tablet Take 10 mg by mouth at bedtime as needed for sleep.       No current facility-administered medications for this visit.    Allergies  Allergen Reactions  . Aspirin Hives    Review of Systems negative except from HPI and PMH  Physical Exam BP 134/83  Pulse 82  Ht 5\' 10"  (1.778 m)  Wt 136 lb 12.8 oz (62.052 kg)  BMI 19.63 kg/m2  LMP 07/10/2014 Well developed and nourished in no acute distress HENT normal Neck supple with JVP-flat Clear Regular rate and rhythm, no murmurs or gallops Abd-soft with active BS No Clubbing cyanosis edema Skin-warm and dry A &  Oriented  Grossly normal sensory and motor function Marfanoid habitus with arachnodactyly     Assessment and  Plan  Ventricular ectopy-right bundle branch/RAD  Dysautonomia/orthostatic intolerance   Chest pain-atypical   Anxiety/PTSD  Elevated Blood pressure  Blood pressure is reasonable. We will continue her on Inderal. We discussed the value of horizontal vertical exercise. She will try that.  Discussed the use of an abdominal binder to augment orthostatic intolerance  Her recurrent syncopal episode is almost certainly neurally mediated given the prolonged nature of its duration; I was unable to find records from here [facility] at [facility].  She continues to work at her psychosocial stressors

## 2014-08-23 ENCOUNTER — Ambulatory Visit (HOSPITAL_COMMUNITY): Payer: Medicaid Other | Attending: Cardiovascular Disease

## 2014-08-23 DIAGNOSIS — Z87891 Personal history of nicotine dependence: Secondary | ICD-10-CM | POA: Insufficient documentation

## 2014-08-23 DIAGNOSIS — F459 Somatoform disorder, unspecified: Secondary | ICD-10-CM | POA: Diagnosis not present

## 2014-08-23 DIAGNOSIS — K5289 Other specified noninfective gastroenteritis and colitis: Secondary | ICD-10-CM | POA: Insufficient documentation

## 2014-08-23 DIAGNOSIS — I059 Rheumatic mitral valve disease, unspecified: Secondary | ICD-10-CM | POA: Diagnosis present

## 2014-08-23 DIAGNOSIS — R042 Hemoptysis: Secondary | ICD-10-CM | POA: Insufficient documentation

## 2014-08-23 DIAGNOSIS — I498 Other specified cardiac arrhythmias: Secondary | ICD-10-CM | POA: Insufficient documentation

## 2014-08-23 DIAGNOSIS — R569 Unspecified convulsions: Secondary | ICD-10-CM | POA: Insufficient documentation

## 2014-08-23 DIAGNOSIS — R12 Heartburn: Secondary | ICD-10-CM | POA: Insufficient documentation

## 2014-08-23 DIAGNOSIS — F1210 Cannabis abuse, uncomplicated: Secondary | ICD-10-CM | POA: Insufficient documentation

## 2014-08-23 DIAGNOSIS — R002 Palpitations: Secondary | ICD-10-CM | POA: Diagnosis not present

## 2014-08-23 DIAGNOSIS — I341 Nonrheumatic mitral (valve) prolapse: Secondary | ICD-10-CM

## 2014-08-23 DIAGNOSIS — IMO0002 Reserved for concepts with insufficient information to code with codable children: Secondary | ICD-10-CM | POA: Diagnosis not present

## 2014-08-23 DIAGNOSIS — F319 Bipolar disorder, unspecified: Secondary | ICD-10-CM | POA: Insufficient documentation

## 2014-08-23 NOTE — Progress Notes (Signed)
2D Echo completed. 08/23/2014

## 2014-08-28 ENCOUNTER — Telehealth: Payer: Self-pay | Admitting: Internal Medicine

## 2014-08-28 NOTE — Telephone Encounter (Signed)
New message ° ° ° ° °Want test results °

## 2014-08-28 NOTE — Telephone Encounter (Signed)
Preliminary results given to patient. Informed her that we would call her back once Dr. Caryl Comes has reviewed and given his recommendations/findings. Patient verbalized understanding and agreeable to plan.

## 2014-09-01 ENCOUNTER — Telehealth: Payer: Self-pay | Admitting: Internal Medicine

## 2014-09-01 NOTE — Telephone Encounter (Signed)
New message ° ° ° ° ° °Want echo results °

## 2014-09-01 NOTE — Telephone Encounter (Signed)
Patient informed of echo results

## 2014-09-07 ENCOUNTER — Telehealth: Payer: Self-pay | Admitting: Internal Medicine

## 2014-09-07 NOTE — Telephone Encounter (Signed)
Pt. Called stating that she found out that she has slipped disks. Pt. Would like to speak to nurse. Please f/u with pt.

## 2014-09-26 ENCOUNTER — Ambulatory Visit: Payer: Medicaid Other | Admitting: Internal Medicine

## 2014-10-23 ENCOUNTER — Encounter (HOSPITAL_COMMUNITY): Payer: Self-pay | Admitting: Emergency Medicine

## 2014-10-23 ENCOUNTER — Emergency Department (HOSPITAL_COMMUNITY)
Admission: EM | Admit: 2014-10-23 | Discharge: 2014-10-23 | Disposition: A | Discharge status: Home / Self Care | Payer: Medicaid Other | Attending: Emergency Medicine | Admitting: Emergency Medicine

## 2014-10-23 ENCOUNTER — Emergency Department (HOSPITAL_COMMUNITY): Payer: Medicaid Other

## 2014-10-23 ENCOUNTER — Telehealth: Payer: Self-pay | Admitting: *Deleted

## 2014-10-23 DIAGNOSIS — R197 Diarrhea, unspecified: Secondary | ICD-10-CM

## 2014-10-23 DIAGNOSIS — R011 Cardiac murmur, unspecified: Secondary | ICD-10-CM | POA: Insufficient documentation

## 2014-10-23 DIAGNOSIS — R000 Tachycardia, unspecified: Secondary | ICD-10-CM | POA: Insufficient documentation

## 2014-10-23 DIAGNOSIS — I10 Essential (primary) hypertension: Secondary | ICD-10-CM | POA: Diagnosis not present

## 2014-10-23 DIAGNOSIS — R55 Syncope and collapse: Secondary | ICD-10-CM | POA: Diagnosis not present

## 2014-10-23 DIAGNOSIS — Z79899 Other long term (current) drug therapy: Secondary | ICD-10-CM | POA: Insufficient documentation

## 2014-10-23 DIAGNOSIS — Z862 Personal history of diseases of the blood and blood-forming organs and certain disorders involving the immune mechanism: Secondary | ICD-10-CM | POA: Insufficient documentation

## 2014-10-23 DIAGNOSIS — Z87891 Personal history of nicotine dependence: Secondary | ICD-10-CM | POA: Insufficient documentation

## 2014-10-23 DIAGNOSIS — Y9389 Activity, other specified: Secondary | ICD-10-CM | POA: Insufficient documentation

## 2014-10-23 DIAGNOSIS — W1839XA Other fall on same level, initial encounter: Secondary | ICD-10-CM | POA: Insufficient documentation

## 2014-10-23 DIAGNOSIS — R1013 Epigastric pain: Secondary | ICD-10-CM | POA: Diagnosis not present

## 2014-10-23 DIAGNOSIS — F329 Major depressive disorder, single episode, unspecified: Secondary | ICD-10-CM | POA: Insufficient documentation

## 2014-10-23 DIAGNOSIS — S4991XA Unspecified injury of right shoulder and upper arm, initial encounter: Secondary | ICD-10-CM | POA: Insufficient documentation

## 2014-10-23 DIAGNOSIS — E860 Dehydration: Secondary | ICD-10-CM | POA: Insufficient documentation

## 2014-10-23 DIAGNOSIS — G43909 Migraine, unspecified, not intractable, without status migrainosus: Secondary | ICD-10-CM | POA: Insufficient documentation

## 2014-10-23 DIAGNOSIS — M79601 Pain in right arm: Secondary | ICD-10-CM

## 2014-10-23 DIAGNOSIS — R112 Nausea with vomiting, unspecified: Secondary | ICD-10-CM | POA: Diagnosis not present

## 2014-10-23 DIAGNOSIS — Z3202 Encounter for pregnancy test, result negative: Secondary | ICD-10-CM | POA: Insufficient documentation

## 2014-10-23 DIAGNOSIS — Y9289 Other specified places as the place of occurrence of the external cause: Secondary | ICD-10-CM | POA: Insufficient documentation

## 2014-10-23 DIAGNOSIS — S0990XA Unspecified injury of head, initial encounter: Secondary | ICD-10-CM | POA: Diagnosis not present

## 2014-10-23 DIAGNOSIS — Y998 Other external cause status: Secondary | ICD-10-CM | POA: Diagnosis not present

## 2014-10-23 DIAGNOSIS — M25511 Pain in right shoulder: Secondary | ICD-10-CM

## 2014-10-23 DIAGNOSIS — Z7952 Long term (current) use of systemic steroids: Secondary | ICD-10-CM | POA: Diagnosis not present

## 2014-10-23 LAB — CBC WITH DIFFERENTIAL/PLATELET
Basophils Absolute: 0 10*3/uL (ref 0.0–0.1)
Basophils Relative: 0 % (ref 0–1)
Eosinophils Absolute: 0 10*3/uL (ref 0.0–0.7)
Eosinophils Relative: 0 % (ref 0–5)
HCT: 36.4 % (ref 36.0–46.0)
Hemoglobin: 12.7 g/dL (ref 12.0–15.0)
Lymphocytes Relative: 38 % (ref 12–46)
Lymphs Abs: 1.9 10*3/uL (ref 0.7–4.0)
MCH: 30.9 pg (ref 26.0–34.0)
MCHC: 34.9 g/dL (ref 30.0–36.0)
MCV: 88.6 fL (ref 78.0–100.0)
Monocytes Absolute: 0.3 10*3/uL (ref 0.1–1.0)
Monocytes Relative: 6 % (ref 3–12)
Neutro Abs: 2.8 10*3/uL (ref 1.7–7.7)
Neutrophils Relative %: 56 % (ref 43–77)
Platelets: 257 10*3/uL (ref 150–400)
RBC: 4.11 MIL/uL (ref 3.87–5.11)
RDW: 12.3 % (ref 11.5–15.5)
WBC: 5 10*3/uL (ref 4.0–10.5)

## 2014-10-23 LAB — BASIC METABOLIC PANEL
Anion gap: 15 (ref 5–15)
BUN: 11 mg/dL (ref 6–23)
CO2: 22 mEq/L (ref 19–32)
Calcium: 9.5 mg/dL (ref 8.4–10.5)
Chloride: 101 mEq/L (ref 96–112)
Creatinine, Ser: 0.67 mg/dL (ref 0.50–1.10)
GFR calc Af Amer: >90 mL/min (ref >90)
GFR calc non Af Amer: >90 mL/min (ref >90)
Glucose, Bld: 91 mg/dL (ref 70–99)
Potassium: 3.6 mEq/L — ABNORMAL LOW (ref 3.7–5.3)
Sodium: 138 mEq/L (ref 137–147)

## 2014-10-23 LAB — URINALYSIS, ROUTINE W REFLEX MICROSCOPIC
Bilirubin Urine: NEGATIVE
Glucose, UA: NEGATIVE mg/dL
Hgb urine dipstick: NEGATIVE
Ketones, ur: NEGATIVE mg/dL
Leukocytes, UA: NEGATIVE
Nitrite: NEGATIVE
Protein, ur: NEGATIVE mg/dL
Specific Gravity, Urine: 1.002 — ABNORMAL LOW (ref 1.005–1.030)
Urobilinogen, UA: 0.2 mg/dL (ref 0.0–1.0)
pH: 6 (ref 5.0–8.0)

## 2014-10-23 LAB — PREGNANCY, URINE: Preg Test, Ur: NEGATIVE

## 2014-10-23 MED ORDER — ONDANSETRON HCL 4 MG/2ML IJ SOLN
4.0000 mg | Freq: Once | INTRAMUSCULAR | Status: AC
  Administered 2014-10-23: 4 mg via INTRAVENOUS
  Filled 2014-10-23: qty 2

## 2014-10-23 MED ORDER — SODIUM CHLORIDE 0.9 % IV BOLUS (SEPSIS)
1000.0000 mL | Freq: Once | INTRAVENOUS | Status: AC
Start: 2014-10-23 — End: 2014-10-23
  Administered 2014-10-23: 1000 mL via INTRAVENOUS

## 2014-10-23 MED ORDER — FENTANYL CITRATE 0.05 MG/ML IJ SOLN
50.0000 ug | Freq: Once | INTRAMUSCULAR | Status: AC
  Administered 2014-10-23: 50 ug via INTRAVENOUS
  Filled 2014-10-23: qty 2

## 2014-10-23 MED ORDER — ACETAMINOPHEN 325 MG PO TABS
650.0000 mg | ORAL_TABLET | Freq: Once | ORAL | Status: AC
  Administered 2014-10-23: 650 mg via ORAL
  Filled 2014-10-23: qty 2

## 2014-10-23 NOTE — Telephone Encounter (Signed)
New Message   Pt called wanting appt earlier than recall date, then stated she was experiencing CP to the point of passing out. Please advise

## 2014-10-23 NOTE — Discharge Instructions (Signed)
If you were given medicines take as directed.  If you are on coumadin or contraceptives realize their levels and effectiveness is altered by many different medicines.  If you have any reaction (rash, tongues swelling, other) to the medicines stop taking and see a physician.   Please follow up as directed and return to the ER or see a physician for new or worsening symptoms.  Thank you. Filed Vitals:   10/23/14 1242 10/23/14 1300 10/23/14 1330 10/23/14 1400  BP: 122/78 123/79 120/76 121/85  Pulse: 63 59 62 64  Temp:      TempSrc:      Resp: 19 15 16 19   SpO2: 100% 100% 100% 100%

## 2014-10-23 NOTE — ED Notes (Signed)
MD at bedside. Friend in room with patient.

## 2014-10-23 NOTE — Telephone Encounter (Signed)
Patient c/o of CP, radiating, arm pit pain, also passing out.  Advised patient to go to ED. Patient agreeable to plan.

## 2014-10-23 NOTE — ED Notes (Signed)
Pt states woke up, felt fine, went to work and started to feel dizzy, out-of-it, nauseous, rt arm pain. Around 10:30 pt states she lost conciousness, fell and woke up on the ground. Unsure if she hit her head. States now she has a terrible headache, and right arm has crushing pain in it.

## 2014-10-23 NOTE — ED Provider Notes (Signed)
CSN: [id number]     Arrival date & time 10/23/14  1102 History   First MD Initiated Contact with Patient 10/23/14 1119     Chief Complaint  Patient presents with  . Near Syncope  . Fall  . Extremity Pain     (Consider location/radiation/quality/duration/timing/severity/associated sxs/prior Treatment) HPI Comments: 28 year old female with history of bipolar, somatization, Potts, narcotic abuse, marijuana abuse presents with vomiting, diarrhea and syncope. For the past 2 days patient has had nonbloody vomiting and diarrhea recurrent, no sick contacts. Patient has had gradually worsening general weakness and lightheadedness. This morning she felt okay point work with standing and walking she started feel lightheadedness and nausea as followed by brief loss of consciousness. Patient not sure if she hit her head. Patient has frontal headache since the fall, denies headache prior. No blood thinners. Patient has right shoulder and arm pain worse with palpation since the fall.  Patient is a 28 y.o. female presenting with near-syncope, fall, and extremity pain. The history is provided by the patient.  Near Syncope Associated symptoms include headaches. Pertinent negatives include no chest pain, no abdominal pain and no shortness of breath.  Fall Associated symptoms include headaches. Pertinent negatives include no chest pain, no abdominal pain and no shortness of breath.  Extremity Pain Associated symptoms include headaches. Pertinent negatives include no chest pain, no abdominal pain and no shortness of breath.    Past Medical History  Diagnosis Date  . Seizures   . Depression   . Panic attack   . Hypertension   . Heart murmur   . Arrhythmia   . PTSD (post-traumatic stress disorder)   . 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 No pertinent past surgical history. Family History  Problem Relation Age of Onset  . Heart attack Mother   . Heart disease Mother    . Sudden death Mother   . Sudden death Sister   . Heart attack Sister   . Sudden death Maternal Grandmother   . Fainting Maternal Grandmother    History  Substance Use Topics  . Smoking status: Former Smoker    Quit date: 06/23/2012  . Smokeless tobacco: Never Used  . Alcohol Use: Yes     Comment: ocassionally   OB History    No data available     Review of Systems  Constitutional: Negative for fever and chills.  HENT: Negative for congestion.   Eyes: Negative for visual disturbance.  Respiratory: Negative for shortness of breath.   Cardiovascular: Positive for near-syncope. Negative for chest pain.  Gastrointestinal: Negative for vomiting and abdominal pain.  Genitourinary: Negative for dysuria and flank pain.  Musculoskeletal: Positive for arthralgias. Negative for back pain, neck pain and neck stiffness.  Skin: Negative for rash.  Neurological: Positive for syncope, weakness (general), light-headedness and headaches.      Allergies  Aspirin  Home Medications   Prior to Admission medications   Medication Sig Start Date End Date Taking? Authorizing Provider  albuterol (PROVENTIL HFA;VENTOLIN HFA) 108 (90 BASE) MCG/ACT inhaler Inhale 2 puffs into the lungs every 4 (four) hours as needed for wheezing or shortness of breath. 06/11/14  Yes Carlisle Cater, PA-C  calcium carbonate (TUMS - DOSED IN MG ELEMENTAL CALCIUM) 500 MG chewable tablet Chew 1-3 tablets by mouth as needed for indigestion or heartburn.   Yes Historical Provider, MD  gabapentin (NEURONTIN) 300 MG capsule Take 300 mg by mouth 3 (three) times daily.   Yes Historical Provider,  MD  omeprazole (PRILOSEC) 40 MG capsule Take 1 capsule (40 mg total) by mouth daily. 03/29/14  Yes Willia Craze, NP  predniSONE (DELTASONE) 20 MG tablet Take 1 tablet (20 mg total) by mouth daily with breakfast. 06/15/14  Yes Lance Bosch, NP  pregabalin (LYRICA) 75 MG capsule Take 1 capsule (75 mg total) by mouth 2 (two) times daily.  05/10/14  Yes Hulen Luster, DO  promethazine (PHENERGAN) 12.5 MG tablet Take 1 tablet (12.5 mg total) by mouth every 6 (six) hours as needed for nausea or vomiting. 03/29/14  Yes Willia Craze, NP  propranolol ER (INDERAL LA) 80 MG 24 hr capsule Take 1 capsule (80 mg total) by mouth daily. 08/16/14  Yes Deboraha Sprang, MD  zolpidem (AMBIEN) 10 MG tablet Take 10 mg by mouth at bedtime as needed for sleep.   Yes Historical Provider, MD  traMADol (ULTRAM) 50 MG tablet Take 1 tablet (50 mg total) by mouth every 12 (twelve) hours as needed. Patient not taking: Reported on 10/23/2014 08/03/14   Lance Bosch, NP   BP 121/85 mmHg  Pulse 64  Temp(Src) 98.6 F (37 C) (Oral)  Resp 19  SpO2 100%  LMP 09/09/2014 Physical Exam  Constitutional: She is oriented to person, place, and time. She appears well-developed and well-nourished.  HENT:  Head: Normocephalic and atraumatic.  Dry mucous membranes  Eyes: Conjunctivae are normal. Right eye exhibits no discharge. Left eye exhibits no discharge.  Neck: Normal range of motion. Neck supple. No tracheal deviation present.  Cardiovascular: Regular rhythm.  Tachycardia present.   Pulmonary/Chest: Effort normal and breath sounds normal.  Abdominal: Soft. She exhibits no distension. There is tenderness (mild epigastric). There is no guarding.  Musculoskeletal: She exhibits tenderness. She exhibits no edema.  Patient has no midline cervical tenderness full range of motion. Patient has mild to moderate proximal humerus and right shoulder discomfort with all range of motion. Patient has mild right upper lateral chest discomfort with palpation reproducible. Patient has equal strength bilateral extremities upper and lower.  Neurological: She is alert and oriented to person, place, and time. She has normal strength. No cranial nerve deficit or sensory deficit.  Skin: Skin is warm. No rash noted.  Psychiatric: She has a normal mood and affect.  Nursing note and  vitals reviewed.   ED Course  Procedures (including critical care time) Labs Review Labs Reviewed  BASIC METABOLIC PANEL - Abnormal; Notable for the following:    Potassium 3.6 (*)    All other components within normal limits  URINALYSIS, ROUTINE W REFLEX MICROSCOPIC - Abnormal; Notable for the following:    Specific Gravity, Urine 1.002 (*)    All other components within normal limits  CBC WITH DIFFERENTIAL  PREGNANCY, URINE    Imaging Review Dg Chest 2 View  10/23/2014   CLINICAL DATA:  Dizziness and nausea ; fell earlier today  EXAM: CHEST  2 VIEW  COMPARISON:  Chest radiograph June 10, 2014 and chest CT June 11, 2014  FINDINGS: There is no edema or consolidation. Heart size and pulmonary vascularity are normal. No adenopathy. No pneumothorax. There is upper lumbar levoscoliosis.  IMPRESSION: No edema or consolidation.   Electronically Signed   By: Lowella Grip M.D.   On: 10/23/2014 12:52   Dg Shoulder Right  10/23/2014   CLINICAL DATA:  Status post fall this morning.  Right shoulder pain.  EXAM: RIGHT SHOULDER - 2+ VIEW  COMPARISON:  Plain films right shoulder 10/29/2003.  FINDINGS: The humerus is located and the acromioclavicular joint is intact. There is no fracture. Image right lung and ribs appear normal.  IMPRESSION: Normal examination.   Electronically Signed   By: Inge Rise M.D.   On: 10/23/2014 12:52   Dg Humerus Right  10/23/2014   CLINICAL DATA:  Status post fall this morning. Right upper arm pain. Initial encounter.  EXAM: RIGHT HUMERUS - 2+ VIEW  COMPARISON:  None.  FINDINGS: Imaged bones, joints and soft tissues appear normal.  IMPRESSION: Normal examination.   Electronically Signed   By: Inge Rise M.D.   On: 10/23/2014 12:52     EKG Interpretation   Date/Time:  Monday October 23 2014 11:46:09 EST Ventricular Rate:  79 PR Interval:  192 QRS Duration: 67 QT Interval:  370 QTC Calculation: 424 R Axis:   83 Text Interpretation:  Sinus rhythm  Confirmed by Niccolas Loeper  MD, Jimmey Hengel ([id number])  on 10/23/2014 12:37:55 PM      MDM   Final diagnoses:  Acute shoulder pain, right  Arm pain, right  Diarrhea  Dehydration  Non-intractable vomiting with nausea, vomiting of unspecified type  Syncope and collapse   Patient presents with gradual onset symptoms followed by syncope. Discussed likely orthostatic/dehydration related. Mild tachycardia and dry mucous members. Plan for basic blood work fluid bolus and Tylenol. X-rays pending. Patient had headache after she fell no headache prior. No recent surgeries or blood clot history, no active cancer or unilateral leg swelling or pain. Normal ambulation Patient improved on reassessment, urinalysis pending. IV fluids and oral fluids given. Discussed close outpatient follow-up. Neuro intact on recheck.   Results and differential diagnosis were discussed with the patient/parent/guardian. Close follow up outpatient was discussed, comfortable with the plan.   Medications  sodium chloride 0.9 % bolus 1,000 mL (0 mLs Intravenous Stopped 10/23/14 1424)  ondansetron (ZOFRAN) injection 4 mg (4 mg Intravenous Given 10/23/14 1153)  acetaminophen (TYLENOL) tablet 650 mg (650 mg Oral Given 10/23/14 1153)  fentaNYL (SUBLIMAZE) injection 50 mcg (50 mcg Intravenous Given 10/23/14 1153)    Filed Vitals:   10/23/14 1242 10/23/14 1300 10/23/14 1330 10/23/14 1400  BP: 122/78 123/79 120/76 121/85  Pulse: 63 59 62 64  Temp:      TempSrc:      Resp: 19 15 16 19   SpO2: 100% 100% 100% 100%    Final diagnoses:  Acute shoulder pain, right  Arm pain, right  Diarrhea  Dehydration  Non-intractable vomiting with nausea, vomiting of unspecified type  Syncope and collapse        Mariea Clonts, MD 10/23/14 1515

## 2014-10-26 ENCOUNTER — Other Ambulatory Visit: Payer: Self-pay

## 2014-10-26 ENCOUNTER — Ambulatory Visit: Payer: Medicaid Other | Attending: Internal Medicine | Admitting: Internal Medicine

## 2014-10-26 ENCOUNTER — Telehealth: Payer: Self-pay | Admitting: *Deleted

## 2014-10-26 ENCOUNTER — Encounter: Payer: Self-pay | Admitting: Internal Medicine

## 2014-10-26 VITALS — BP 121/80 | HR 95 | Temp 97.9°F | Resp 16 | Ht 70.0 in | Wt 138.0 lb

## 2014-10-26 DIAGNOSIS — R55 Syncope and collapse: Secondary | ICD-10-CM | POA: Diagnosis present

## 2014-10-26 DIAGNOSIS — M549 Dorsalgia, unspecified: Secondary | ICD-10-CM

## 2014-10-26 DIAGNOSIS — F4310 Post-traumatic stress disorder, unspecified: Secondary | ICD-10-CM | POA: Diagnosis not present

## 2014-10-26 DIAGNOSIS — Z79899 Other long term (current) drug therapy: Secondary | ICD-10-CM | POA: Diagnosis not present

## 2014-10-26 DIAGNOSIS — R52 Pain, unspecified: Secondary | ICD-10-CM | POA: Insufficient documentation

## 2014-10-26 DIAGNOSIS — Z87891 Personal history of nicotine dependence: Secondary | ICD-10-CM | POA: Diagnosis not present

## 2014-10-26 DIAGNOSIS — F329 Major depressive disorder, single episode, unspecified: Secondary | ICD-10-CM | POA: Insufficient documentation

## 2014-10-26 DIAGNOSIS — F319 Bipolar disorder, unspecified: Secondary | ICD-10-CM | POA: Insufficient documentation

## 2014-10-26 DIAGNOSIS — Z886 Allergy status to analgesic agent status: Secondary | ICD-10-CM | POA: Insufficient documentation

## 2014-10-26 DIAGNOSIS — I10 Essential (primary) hypertension: Secondary | ICD-10-CM | POA: Insufficient documentation

## 2014-10-26 DIAGNOSIS — G8929 Other chronic pain: Secondary | ICD-10-CM

## 2014-10-26 DIAGNOSIS — R5383 Other fatigue: Secondary | ICD-10-CM

## 2014-10-26 DIAGNOSIS — F1290 Cannabis use, unspecified, uncomplicated: Secondary | ICD-10-CM

## 2014-10-26 DIAGNOSIS — F1210 Cannabis abuse, uncomplicated: Secondary | ICD-10-CM | POA: Diagnosis not present

## 2014-10-26 DIAGNOSIS — R002 Palpitations: Secondary | ICD-10-CM

## 2014-10-26 LAB — VITAMIN B12: Vitamin B-12: 294 pg/mL (ref 211–911)

## 2014-10-26 NOTE — Progress Notes (Signed)
Pt is here today following up on her chronic pain in her upper back and chest. Pt states that she has a knott in her left eyelid w/ puss and pain.

## 2014-10-26 NOTE — Progress Notes (Signed)
Patient ID: [name], female   DOB: 08-28-1986, 28 y.o.   MRN: [id number]  CC:  Palpitations   HPI:  Patient presents to clinic today as a ED follow up.  Patient states that she was at work three days ago and began to get hot, diaphoretic, and dizzy.  She at that time had a syncopal episode and was transported to the ER.  She had several test ran which were all negative at the time.  Patient has a history of POTS and has not been to see her cardiologist in a few months and has stopped taking her propanolol.  She reports that when she was on the beta blocker she felt somewhat better but the medication made her feel very fatigued.  She continues to have heart flutters, palpitations, and dizziness.  Patient states that she is very worried about her heart due to losing her sister at age 48 due to cardiac arrest, mother at 21o and aunt at 39 all due to heart "issues".   She also has concerns of constant generalized pain.  She currently takes gabapentin 100 mg TID for anxiety and zoloft for depression.  She has a history of bipolar disorder but is not taking a mood stabilizer.  Her neurologist has her on lyrica 300 mg BID and states that she notices more fatigue.  She does admit to marijuana use but gets very angry when questioned about her depression or drug use. During the exam the patient became very irate and demands to have testing for multiple sclerosis although she has not focal deficit       Allergies  Allergen Reactions  . Aspirin Hives   Past Medical History  Diagnosis Date  . Seizures   . Depression   . Panic attack   . Hypertension   . Heart murmur   . Arrhythmia   . PTSD (post-traumatic stress disorder)   . 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 albuterol (PROVENTIL HFA;VENTOLIN HFA) 108 (90 BASE) MCG/ACT inhaler Inhale 2 puffs into the lungs every 4 (four) hours as needed for  wheezing or shortness of breath. 1 Inhaler 0  . calcium carbonate (TUMS - DOSED IN MG ELEMENTAL CALCIUM) 500 MG chewable tablet Chew 1-3 tablets by mouth as needed for indigestion or heartburn.    . gabapentin (NEURONTIN) 300 MG capsule Take 300 mg by mouth 3 (three) times daily.    Marland Kitchen omeprazole (PRILOSEC) 40 MG capsule Take 1 capsule (40 mg total) by mouth daily. 30 capsule 3  . predniSONE (DELTASONE) 20 MG tablet Take 1 tablet (20 mg total) by mouth daily with breakfast. 5 tablet 0  . pregabalin (LYRICA) 75 MG capsule Take 1 capsule (75 mg total) by mouth 2 (two) times daily. 60 capsule 3  . promethazine (PHENERGAN) 12.5 MG tablet Take 1 tablet (12.5 mg total) by mouth every 6 (six) hours as needed for nausea or vomiting. 30 tablet 0  . propranolol ER (INDERAL LA) 80 MG 24 hr capsule Take 1 capsule (80 mg total) by mouth daily. (Patient not taking: Reported on 10/26/2014) 30 capsule 4  . traMADol (ULTRAM) 50 MG tablet Take 1 tablet (50 mg total) by mouth every 12 (twelve) hours as needed. (Patient not taking: Reported on 10/23/2014) 60 tablet 0  . zolpidem (AMBIEN) 10 MG tablet Take 10 mg by mouth at bedtime as needed for sleep.  No current facility-administered medications on file prior to visit.   Family History  Problem Relation Age of Onset  . Heart attack Mother   . Heart disease Mother   . Sudden death Mother   . Sudden death Sister   . Heart attack Sister   . Sudden death Maternal Grandmother   . Fainting Maternal Grandmother    History   Social History  . Marital Status: Single    Spouse Name: N/A    Number of Children: N/A  . Years of Education: N/A   Occupational History  . Not on file.   Social History Main Topics  . Smoking status: Former Smoker    Quit date: 06/23/2012  . Smokeless tobacco: Never Used  . Alcohol Use: Yes     Comment: ocassionally  . Drug Use: Yes    Special: Marijuana  . Sexual Activity: Not on file   Other Topics Concern  . Not on file    Social History Narrative   Patient is single, currently raising her nephews   Patient is right handed   Patient's education level is high school graduate   Patient doesn't drink caffeine          Review of Systems  Constitutional: Positive for diaphoresis.  Respiratory: Negative.   Cardiovascular: Positive for chest pain and palpitations. Negative for claudication and leg swelling.  Neurological: Positive for dizziness. Negative for headaches.  Psychiatric/Behavioral: Positive for depression and substance abuse (marijuana). Negative for suicidal ideas. The patient is nervous/anxious.       Objective:   Filed Vitals:   10/26/14 1222  BP: 121/80  Pulse: 95  Temp: 97.9 F (36.6 C)  Resp: 16    Physical Exam  Constitutional: She is oriented to person, place, and time.  Neck: No thyromegaly present.  Cardiovascular: Normal rate, regular rhythm and normal heart sounds.   No murmur heard. Pulmonary/Chest: Effort normal and breath sounds normal. No respiratory distress.  Neurological: She is alert and oriented to person, place, and time.  Skin: Skin is warm and dry.  Psychiatric:  Very irate and anxious      Lab Results  Component Value Date   WBC 5.0 10/23/2014   HGB 12.7 10/23/2014   HCT 36.4 10/23/2014   MCV 88.6 10/23/2014   PLT 257 10/23/2014   Lab Results  Component Value Date   CREATININE 0.67 10/23/2014   BUN 11 10/23/2014   NA 138 10/23/2014   K 3.6* 10/23/2014   CL 101 10/23/2014   CO2 22 10/23/2014    No results found for: HGBA1C Lipid Panel     Component Value Date/Time   CHOL 109 06/07/2012 0444   TRIG 61 06/07/2012 0444   HDL 38* 06/07/2012 0444   CHOLHDL 2.9 06/07/2012 0444   VLDL 12 06/07/2012 0444   LDLCALC 59 06/07/2012 0444       Assessment and plan:   [name] was seen today for follow-up.  Diagnoses and associated orders for this visit:  Palpitations  Follow ups with Cardiology---will possible need to restart beta  blocker  Chronic back pain - Ambulatory referral to Orthopedic Surgery Patient was released from Pain clinic due to + drug screen with marijuana and refusal to comply with clinic policies   Other fatigue - Vitamin D, 25-hydroxy - Vitamin B12  Marijuana use Explained to patient that marijuana use may contribute to palpitations   Follow up with specialist.  Neurology can do testing for MS if they feel it is warranted.  Spent 40 minutes with patient doing counseling and calming patient down.        Chari Manning, NP-C [facility] [phone] 10/26/2014, 12:41 PM

## 2014-10-26 NOTE — Telephone Encounter (Signed)
Called patient to inform her that her appt time for 11/20/2014 has been changed to 1:45 pm but to arrive early to sign in. Patient verbalized understanding and agreement.

## 2014-10-27 LAB — VITAMIN D 25 HYDROXY (VIT D DEFICIENCY, FRACTURES): Vit D, 25-Hydroxy: 8 ng/mL — ABNORMAL LOW (ref 30–100)

## 2014-10-31 ENCOUNTER — Telehealth: Payer: Self-pay | Admitting: Internal Medicine

## 2014-10-31 ENCOUNTER — Telehealth: Payer: Self-pay | Admitting: Emergency Medicine

## 2014-10-31 MED ORDER — VITAMIN D (ERGOCALCIFEROL) 1.25 MG (50000 UNIT) PO CAPS
50000.0000 [IU] | ORAL_CAPSULE | ORAL | Status: DC
Start: 2014-10-31 — End: 2015-03-13

## 2014-10-31 NOTE — Telephone Encounter (Signed)
Left message for pt to call for lab results Medication e-scribed to [facility]

## 2014-10-31 NOTE — Telephone Encounter (Signed)
-----   Message from Lance Bosch, NP sent at 10/29/2014 10:54 PM EST ----- Call patient and let her know that her Vit D is supper low. Please send Drisdol 50,000 IU to take ONCE weekly for 12 weeks.  Send 12 tablets with no refills. Let her know that this may be contributing to some of her fatigue and she may begin to feel better once she gets to a normal level.  Vit B12 is normal.

## 2014-10-31 NOTE — Telephone Encounter (Signed)
Pt returning call for blood work results. Please f/u with pt.  °

## 2014-11-12 ENCOUNTER — Emergency Department (HOSPITAL_COMMUNITY): Payer: Medicaid Other

## 2014-11-12 ENCOUNTER — Emergency Department (HOSPITAL_COMMUNITY)
Admission: EM | Admit: 2014-11-12 | Discharge: 2014-11-12 | Disposition: A | Discharge status: Home / Self Care | Payer: Medicaid Other | Attending: Emergency Medicine | Admitting: Emergency Medicine

## 2014-11-12 ENCOUNTER — Encounter (HOSPITAL_COMMUNITY): Payer: Self-pay | Admitting: Emergency Medicine

## 2014-11-12 DIAGNOSIS — Z3202 Encounter for pregnancy test, result negative: Secondary | ICD-10-CM | POA: Insufficient documentation

## 2014-11-12 DIAGNOSIS — M25462 Effusion, left knee: Secondary | ICD-10-CM | POA: Insufficient documentation

## 2014-11-12 DIAGNOSIS — M549 Dorsalgia, unspecified: Secondary | ICD-10-CM | POA: Diagnosis not present

## 2014-11-12 DIAGNOSIS — R011 Cardiac murmur, unspecified: Secondary | ICD-10-CM | POA: Insufficient documentation

## 2014-11-12 DIAGNOSIS — I495 Sick sinus syndrome: Secondary | ICD-10-CM | POA: Insufficient documentation

## 2014-11-12 DIAGNOSIS — G8929 Other chronic pain: Secondary | ICD-10-CM | POA: Diagnosis not present

## 2014-11-12 DIAGNOSIS — M542 Cervicalgia: Secondary | ICD-10-CM | POA: Insufficient documentation

## 2014-11-12 DIAGNOSIS — J029 Acute pharyngitis, unspecified: Secondary | ICD-10-CM | POA: Insufficient documentation

## 2014-11-12 DIAGNOSIS — R51 Headache: Secondary | ICD-10-CM | POA: Diagnosis not present

## 2014-11-12 DIAGNOSIS — H9201 Otalgia, right ear: Secondary | ICD-10-CM | POA: Diagnosis not present

## 2014-11-12 DIAGNOSIS — R42 Dizziness and giddiness: Secondary | ICD-10-CM | POA: Insufficient documentation

## 2014-11-12 DIAGNOSIS — I951 Orthostatic hypotension: Secondary | ICD-10-CM

## 2014-11-12 DIAGNOSIS — I498 Other specified cardiac arrhythmias: Secondary | ICD-10-CM

## 2014-11-12 DIAGNOSIS — Z862 Personal history of diseases of the blood and blood-forming organs and certain disorders involving the immune mechanism: Secondary | ICD-10-CM | POA: Insufficient documentation

## 2014-11-12 DIAGNOSIS — G40909 Epilepsy, unspecified, not intractable, without status epilepticus: Secondary | ICD-10-CM | POA: Insufficient documentation

## 2014-11-12 DIAGNOSIS — M25562 Pain in left knee: Secondary | ICD-10-CM | POA: Insufficient documentation

## 2014-11-12 DIAGNOSIS — G90A Postural orthostatic tachycardia syndrome (POTS): Secondary | ICD-10-CM

## 2014-11-12 DIAGNOSIS — Z87891 Personal history of nicotine dependence: Secondary | ICD-10-CM | POA: Diagnosis not present

## 2014-11-12 DIAGNOSIS — I10 Essential (primary) hypertension: Secondary | ICD-10-CM | POA: Insufficient documentation

## 2014-11-12 DIAGNOSIS — R112 Nausea with vomiting, unspecified: Secondary | ICD-10-CM | POA: Diagnosis not present

## 2014-11-12 DIAGNOSIS — R000 Tachycardia, unspecified: Secondary | ICD-10-CM

## 2014-11-12 DIAGNOSIS — Z7952 Long term (current) use of systemic steroids: Secondary | ICD-10-CM | POA: Diagnosis not present

## 2014-11-12 DIAGNOSIS — R55 Syncope and collapse: Secondary | ICD-10-CM | POA: Diagnosis present

## 2014-11-12 DIAGNOSIS — Z79899 Other long term (current) drug therapy: Secondary | ICD-10-CM | POA: Insufficient documentation

## 2014-11-12 LAB — CBG MONITORING, ED: Glucose-Capillary: 85 mg/dL (ref 70–99)

## 2014-11-12 LAB — I-STAT CHEM 8, ED
BUN: 13 mg/dL (ref 6–23)
Calcium, Ion: 1.25 mmol/L — ABNORMAL HIGH (ref 1.12–1.23)
Chloride: 106 mEq/L (ref 96–112)
Creatinine, Ser: 0.7 mg/dL (ref 0.50–1.10)
Glucose, Bld: 81 mg/dL (ref 70–99)
HCT: 43 % (ref 36.0–46.0)
Hemoglobin: 14.6 g/dL (ref 12.0–15.0)
Potassium: 3.8 mEq/L (ref 3.7–5.3)
Sodium: 141 mEq/L (ref 137–147)
TCO2: 23 mmol/L (ref 0–100)

## 2014-11-12 LAB — PREGNANCY, URINE: Preg Test, Ur: NEGATIVE

## 2014-11-12 MED ORDER — SODIUM CHLORIDE 0.9 % IV BOLUS (SEPSIS)
1000.0000 mL | Freq: Once | INTRAVENOUS | Status: AC
  Administered 2014-11-12: 1000 mL via INTRAVENOUS

## 2014-11-12 MED ORDER — MORPHINE SULFATE 4 MG/ML IJ SOLN
4.0000 mg | Freq: Once | INTRAMUSCULAR | Status: AC
  Administered 2014-11-12: 4 mg via INTRAVENOUS
  Filled 2014-11-12: qty 1

## 2014-11-12 MED ORDER — NAPROXEN 500 MG PO TABS: 500.0000 mg | ORAL_TABLET | Freq: Two times a day (BID) | ORAL | Status: DC | PRN

## 2014-11-12 MED ORDER — ONDANSETRON 4 MG PO TBDP: 4.0000 mg | ORAL_TABLET | Freq: Three times a day (TID) | ORAL | Status: DC | PRN

## 2014-11-12 MED ORDER — ONDANSETRON HCL 4 MG/2ML IJ SOLN
4.0000 mg | Freq: Once | INTRAMUSCULAR | Status: AC
  Administered 2014-11-12: 4 mg via INTRAVENOUS
  Filled 2014-11-12: qty 2

## 2014-11-12 MED ORDER — HYDROCODONE-ACETAMINOPHEN 5-325 MG PO TABS: 1.0000 | ORAL_TABLET | Freq: Four times a day (QID) | ORAL | Status: DC | PRN

## 2014-11-12 NOTE — ED Notes (Signed)
Pt. Unable to urinate at this time. Nurse was notified.

## 2014-11-12 NOTE — ED Notes (Signed)
PA Mercedes at bedside

## 2014-11-12 NOTE — ED Notes (Signed)
Pt ambulated in room with crutches without issues.

## 2014-11-12 NOTE — ED Notes (Signed)
Pt tolerating fluids well. 

## 2014-11-12 NOTE — Discharge Instructions (Signed)
Stay well hydrated! Use zofran as needed for nausea. Wear knee sleeve and use crutches as needed for comfort. Ice and elevate knee throughout the day. Alternate between naprosyn and percocet as needed for pain relief. Do not drive or operate machinery with pain medication use. Follow up with your regular doctor for ongoing care. Return to the ER for changes or worsening symptoms.   Near-Syncope Near-syncope (commonly known as near fainting) is sudden weakness, dizziness, or feeling like you might pass out. This can happen when getting up or while standing for a long time. It is caused by a sudden decrease in blood flow to the brain, which can occur for various reasons. Most of the reasons are not serious.  HOME CARE Watch your condition for any changes.  Have someone stay with you until you feel stable.  If you feel like you are going to pass out:  Lie down right away.  Prop your feet up if you can.  Breathe deeply and steadily.  Move only when the feeling has gone away. Most of the time, this feeling lasts only a few minutes. You may feel tired for several hours.  Drink enough fluids to keep your pee (urine) clear or pale yellow.  If you are taking blood pressure or heart medicine, stand up slowly.  Follow up with your doctor as told. GET HELP RIGHT AWAY IF:   You have a severe headache.  You have unusual pain in the chest, belly (abdomen), or back.  You have bleeding from the mouth or butt (rectum), or you have black or tarry poop (stool).  You feel your heart beat differently than normal, or you have a very fast pulse.  You pass out, or you twitch and shake when you pass out.  You pass out when sitting or lying down.  You feel confused.  You have trouble walking.  You are weak.  You have vision problems. MAKE SURE YOU:   Understand these instructions.  Will watch your condition.  Will get help right away if you are not doing well or get worse. Document  Released: 04/28/2008 Document Revised: 11/15/2013 Document Reviewed: 04/15/2013 [facility] Patient Information 2015 [place], [place]. This information is not intended to replace advice given to you by your health care provider. Make sure you discuss any questions you have with your health care provider.  Nausea, Adult Nausea means you feel sick to your stomach or need to throw up (vomit). It may be a sign of a more serious problem. If nausea gets worse, you may throw up. If you throw up a lot, you may lose too much body fluid (dehydration). HOME CARE   Get plenty of rest.  Ask your doctor how to replace body fluid losses (rehydrate).  Eat small amounts of food. Sip liquids more often.  Take all medicines as told by your doctor. GET HELP RIGHT AWAY IF:  You have a fever.  You pass out (faint).  You keep throwing up or have blood in your throw up.  You are very weak, have dry lips or a dry mouth, or you are very thirsty (dehydrated).  You have dark or bloody poop (stool).  You have very bad chest or belly (abdominal) pain.  You do not get better after 2 days, or you get worse.  You have a headache. MAKE SURE YOU:  Understand these instructions.  Will watch your condition.  Will get help right away if you are not doing well or get worse. Document Released:  10/30/2011 Document Revised: 02/02/2012 Document Reviewed: 10/30/2011 [employer] Patient Information 2015 [place], [place]. This information is not intended to replace advice given to you by your health care provider. Make sure you discuss any questions you have with your health care provider.  Knee Pain Knee pain can be a result of an injury or other medical conditions. Treatment will depend on the cause of your pain. HOME CARE  Only take medicine as told by your doctor.  Keep a healthy weight. Being overweight can make the knee hurt more.  Stretch before exercising or playing sports.  If there is constant knee pain,  change the way you exercise. Ask your doctor for advice.  Make sure shoes fit well. Choose the right shoe for the sport or activity.  Protect your knees. Wear kneepads if needed.  Rest when you are tired. GET HELP RIGHT AWAY IF:   Your knee pain does not stop.  Your knee pain does not get better.  Your knee joint feels hot to the touch.  You have a fever. MAKE SURE YOU:   Understand these instructions.  Will watch this condition.  Will get help right away if you are not doing well or get worse. Document Released: 02/06/2009 Document Revised: 02/02/2012 Document Reviewed: 02/06/2009 [facility] Patient Information 2015 [place], [place]. This information is not intended to replace advice given to you by your health care provider. Make sure you discuss any questions you have with your health care provider.  Cryotherapy Cryotherapy is when you put ice on your injury. Ice helps lessen pain and puffiness (swelling) after an injury. Ice works the best when you start using it in the first 24 to 48 hours after an injury. HOME CARE  Put a dry or damp towel between the ice pack and your skin.  You may press gently on the ice pack.  Leave the ice on for no more than 10 to 20 minutes at a time.  Check your skin after 5 minutes to make sure your skin is okay.  Rest at least 20 minutes between ice pack uses.  Stop using ice when your skin loses feeling (numbness).  Do not use ice on someone who cannot tell you when it hurts. This includes small children and people with memory problems (dementia). GET HELP RIGHT AWAY IF:  You have white spots on your skin.  Your skin turns blue or pale.  Your skin feels waxy or hard.  Your puffiness gets worse. MAKE SURE YOU:   Understand these instructions.  Will watch your condition.  Will get help right away if you are not doing well or get worse. Document Released: 04/28/2008 Document Revised: 02/02/2012 Document Reviewed:  07/03/2011 [facility] Patient Information 2015 [place], [place]. This information is not intended to replace advice given to you by your health care provider. Make sure you discuss any questions you have with your health care provider.

## 2014-11-12 NOTE — ED Notes (Signed)
Pt transported to restroom via [facility] by [facility] [place]

## 2014-11-12 NOTE — ED Provider Notes (Signed)
CSN: [id number]     Arrival date & time 11/12/14  1031 History   First MD Initiated Contact with Patient 11/12/14 1123     Chief Complaint  Patient presents with  . Near Syncope     (Consider location/radiation/quality/duration/timing/severity/associated sxs/prior Treatment) HPI Comments: [name] is a 28 y.o. female with a PMHx of PTSD/depression/bipolar, somatization, Potts, substance abuse, HTN, and panic attacks, who presents to the ED with complaints of "syncopal" episode that occurred last night. Patient states that she was standing playing with her son, and that the next thing she remembers is awakening this morning. When asked whether anybody in the house was concerned, she states that they woke her up but she went back to sleep, she denies hitting her head, but doesn't recall any other details until she awoke this morning. She is now complaining of left lateral knee pain, 9/10 throbbing constant nonradiating worse with movement and walking, with no alleviating factors known given that she has not tried anything prior to coming. She also complains of chronic bilateral paraspinous neck pain, dizziness and lightheadedness with standing, and "pain everywhere". She becomes very tearful when questioned about what is chronic and what is acute, and she eventually reports that the left knee pain is the only pain that is new. She does endorse that she is having a mild headache, but states that this is her typical headache and it is not severe at this time. She reports that her right ear has been bothering her, and radiates into her throat, but has not had any drainage. She reports 3 episodes of nonbloody nonbilious emesis stating that it was whenever she ate this morning, but reports that she is not having ongoing nausea. She denies any fevers, chills, chest pain, palpitations, shortness of breath, abdominal pain, diarrhea, constipation, hematochezia, melena, dysuria, hematuria, vaginal bleeding or  discharge, or new weakness, numbness, or paresthesias. She does report that she has been to a variety of specialists, and that she is now being referred to a neurologist for evaluation of MS.  Patient is a 28 y.o. female presenting with near-syncope. The history is provided by the patient. No language interpreter was used.  Near Syncope This is a chronic problem. The current episode started yesterday. The problem occurs every several days. The problem has been unchanged. Associated symptoms include arthralgias (L knee), headaches (mild, typical headache), joint swelling (L knee), myalgias ("pain all over"), nausea, neck pain (b/l paraspinous, chronic and unchanged), a sore throat and vomiting (3 episodes of stomach contents). Pertinent negatives include no abdominal pain, change in bowel habit, chest pain, chills, congestion, coughing, diaphoresis, fever, numbness, rash, urinary symptoms, vertigo, visual change or weakness. Nothing aggravates the symptoms. She has tried nothing for the symptoms. The treatment provided no relief.    Past Medical History  Diagnosis Date  . Seizures   . Depression   . Panic attack   . Hypertension   . Heart murmur   . Arrhythmia   . PTSD (post-traumatic stress disorder)   . 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 No pertinent past surgical history. Family History  Problem Relation Age of Onset  . Heart attack Mother   . Heart disease Mother   . Sudden death Mother   . Sudden death Sister   . Heart attack Sister   . Sudden death Maternal Grandmother   . Fainting Maternal Grandmother    History  Substance Use Topics  . Smoking status: Former  Smoker    Quit date: 06/23/2012  . Smokeless tobacco: Never Used  . Alcohol Use: Yes     Comment: ocassionally   OB History    No data available     Review of Systems  Constitutional: Negative for fever, chills and diaphoresis.  HENT: Positive for ear pain (right ear) and sore  throat. Negative for congestion, ear discharge and rhinorrhea.   Eyes: Negative for photophobia, pain and visual disturbance.  Respiratory: Negative for cough and shortness of breath.   Cardiovascular: Positive for near-syncope. Negative for chest pain, palpitations and leg swelling.  Gastrointestinal: Positive for nausea and vomiting (3 episodes of stomach contents). Negative for abdominal pain, diarrhea, constipation, blood in stool and change in bowel habit.  Genitourinary: Negative for dysuria, frequency, hematuria, flank pain, vaginal bleeding, vaginal discharge and menstrual problem.       +malodorous urine  Musculoskeletal: Positive for myalgias ("pain all over"), back pain (chronic and unchanged), joint swelling (L knee), arthralgias (L knee) and neck pain (b/l paraspinous, chronic and unchanged). Negative for neck stiffness.  Skin: Negative for color change and rash.  Neurological: Positive for syncope, light-headedness and headaches (mild, typical headache). Negative for dizziness, vertigo, tremors, seizures, weakness and numbness.  Psychiatric/Behavioral: Negative for confusion.   10 Systems reviewed and are negative for acute change except as noted in the HPI.    Allergies  Aspirin  Home Medications   Prior to Admission medications   Medication Sig Start Date End Date Taking? Authorizing Provider  albuterol (PROVENTIL HFA;VENTOLIN HFA) 108 (90 BASE) MCG/ACT inhaler Inhale 2 puffs into the lungs every 4 (four) hours as needed for wheezing or shortness of breath. 06/11/14  Yes Carlisle Cater, PA-C  gabapentin (NEURONTIN) 300 MG capsule Take 100 mg by mouth 3 (three) times daily.    Yes Historical Provider, MD  omeprazole (PRILOSEC) 40 MG capsule Take 1 capsule (40 mg total) by mouth daily. 03/29/14  Yes Willia Craze, NP  predniSONE (DELTASONE) 20 MG tablet Take 1 tablet (20 mg total) by mouth daily with breakfast. Patient taking differently: Take 20 mg by mouth daily as needed  (for inflamation).  06/15/14  Yes Lance Bosch, NP  pregabalin (LYRICA) 75 MG capsule Take 1 capsule (75 mg total) by mouth 2 (two) times daily. 05/10/14  Yes Hulen Luster, DO  promethazine (PHENERGAN) 12.5 MG tablet Take 1 tablet (12.5 mg total) by mouth every 6 (six) hours as needed for nausea or vomiting. 03/29/14  Yes Willia Craze, NP  propranolol ER (INDERAL LA) 80 MG 24 hr capsule Take 1 capsule (80 mg total) by mouth daily. 08/16/14  Yes Deboraha Sprang, MD  sertraline (ZOLOFT) 100 MG tablet Take 100 mg by mouth daily.   Yes Historical Provider, MD  Vitamin D, Ergocalciferol, (DRISDOL) 50000 UNITS CAPS capsule Take 1 capsule (50,000 Units total) by mouth every 7 (seven) days. 10/31/14  Yes Lance Bosch, NP  zolpidem (AMBIEN) 10 MG tablet Take 10 mg by mouth at bedtime as needed for sleep.   Yes Historical Provider, MD  traMADol (ULTRAM) 50 MG tablet Take 1 tablet (50 mg total) by mouth every 12 (twelve) hours as needed. Patient not taking: Reported on 10/23/2014 08/03/14   Lance Bosch, NP   BP 147/93 mmHg  Pulse 109  Temp(Src) 98.2 F (36.8 C) (Oral)  Resp 18  SpO2 100%  LMP 10/25/2014 Physical Exam  Constitutional: She is oriented to person, place, and time. She appears well-developed and  well-nourished.  Non-toxic appearance. She appears distressed (tearful and crying).  Tearful on exam, afebrile nontoxic, slightly tachycardic initially which resolved during exam. VSS.   HENT:  Head: Normocephalic and atraumatic.  Right Ear: Hearing, tympanic membrane, external ear and ear canal normal.  Left Ear: Hearing, tympanic membrane, external ear and ear canal normal.  Nose: Nose normal.  Mouth/Throat: Uvula is midline, oropharynx is clear and moist and mucous membranes are normal. No trismus in the jaw. No uvula swelling.  Ears clear bilaterally Nose clear [place]/AT, no raccoon eyes or scalp TTP/crepitus Oropharynx clear, no tonsillar swelling or erythema, no exudates, MMM  Eyes:  Conjunctivae and EOM are normal. Pupils are equal, round, and reactive to light. Right eye exhibits no discharge. Left eye exhibits no discharge.  PERRL, EOMI  Neck: Normal range of motion. Neck supple. Muscular tenderness present. No spinous process tenderness present. No rigidity. Normal range of motion present.    B/l paraspinous muscle TTP  FROM intact without spinous process TTP, no bony stepoffs or deformities, no muscle spasms. No rigidity or meningeal signs. No bruising or swelling.   Cardiovascular: Regular rhythm, normal heart sounds and intact distal pulses.  Tachycardia present.  Exam reveals no gallop and no friction rub.   No murmur heard. Slightly tachycardic upon arrival which resolved during exam. RRR, nl s1/s2, no m/r/g, distal pulses intact  Pulmonary/Chest: Effort normal and breath sounds normal. No respiratory distress. She has no decreased breath sounds. She has no wheezes. She has no rhonchi. She has no rales.  CTAB in all lung fields  Abdominal: Soft. Normal appearance and bowel sounds are normal. She exhibits no distension. There is generalized tenderness. There is no rigidity, no rebound, no guarding, no CVA tenderness, no tenderness at McBurney's point and negative Murphy's sign.  Soft, ND, +BS throughout, with diffuse TTP throughout even before abdomen is palpated raising the concern for unreliable exam. No r/g/r, neg murphy's, neg mcburney's, no CVA TTP   Musculoskeletal:       Left knee: She exhibits decreased range of motion (due to pain) and swelling (mildly over lateral aspect). She exhibits no effusion, no deformity, no erythema, normal alignment, no LCL laxity, normal patellar mobility, no bony tenderness and no MCL laxity. Tenderness found. Lateral joint line tenderness noted.  L knee with lateral joint line swelling and tenderness to palpation, decreased ROM due to pain, no effusion or crepitus, no abnl alignment or erythema, no warmth, no abnormal patellar  mobility, no bony TTP, no varus/valgus laxity. Pt refuses to ambulate. Strength and sensation at baseline, pt doesn't cooperate well with exam. Distal pulses intact  Neurological: She is alert and oriented to person, place, and time. She has normal strength. No sensory deficit. GCS eye subscore is 4. GCS verbal subscore is 5. GCS motor subscore is 6.  CN 2-12 grossly intact A&O x4 GCS 15 Sensation and strength intact Gait unable to be assessed due to pt uncooperativeness Coordination with finger-to-nose WNL  Skin: Skin is warm, dry and intact. No rash noted.  Psychiatric:  Tearful and crying  Nursing note and vitals reviewed.   ED Course  Procedures (including critical care time) Orthostatic Lying  - BP- Lying: 125/80 mmHg ; Pulse- Lying: 87  Orthostatic Sitting - BP- Sitting: 123/79 mmHg ; Pulse- Sitting: 97  Orthostatic Standing at 0 minutes - BP- Standing at 0 minutes: 131/88 mmHg ; Pulse- Standing at 0 minutes: 108  Labs Review Labs Reviewed  I-STAT CHEM 8, ED - Abnormal; Notable  for the following:    Calcium, Ion 1.25 (*)    All other components within normal limits  PREGNANCY, URINE  CBG MONITORING, ED    Imaging Review Dg Knee Complete 4 Views Left  11/12/2014   CLINICAL DATA:  Patient status post fall. Anterior lateral knee pain.  EXAM: LEFT KNEE - COMPLETE 4+ VIEW  COMPARISON:  02/07/2013  FINDINGS: There is no evidence of fracture, dislocation, or joint effusion. There is no evidence of arthropathy or other focal bone abnormality. Soft tissues are unremarkable.  IMPRESSION: Negative.   Electronically Signed   By: Lovey Newcomer M.D.   On: 11/12/2014 12:58     EKG Interpretation None    EKG: sinus tachycardia unchanged from prior EKGs  MDM   Final diagnoses:  Left knee pain  Syncope, unspecified syncope type  Chronic pain  Non-intractable vomiting with nausea, vomiting of unspecified type  POTS (postural orthostatic tachycardia syndrome)    28 y.o. female with  multiple complaints, a variety of which are chronic. Full work up on 10/23/14 did not show anything acute. She has had chronic lightheadedness and multiple syncopal episodes for which she's been evaluated before without etiology. Only acute symptom today is L knee pain/swelling. Will give fluids, get basic labs, upreg, orthostatics, and give small amount of morphine, and obtain imaging. Neurovascularly intact with soft compartments. Pt doesn't participate in neuro exam/gait evaluation, but no focal neuro deficits noted on remainder of exam. Will reassess after labs and imaging.   2:16 PM No orthostasis but VS did show POTS, after fluids HR normalized. Upreg neg, chem 8 unremarkable. L knee xray neg. EKG unchanged from prior ED visits. Pain returning, will give small amount of morphine again since this worked before. Nausea controlled, PO challenged, no ongoing emesis. Will give knee sleeve and crutches for knee pain. Pt well appearing and doubt need for further work up or evaluation. Discussed f/up with PCP for chronic issues, given pain meds and antiemetics for home. I explained the diagnosis and have given explicit precautions to return to the ER including for any other new or worsening symptoms. The patient understands and accepts the medical plan as it's been dictated and I have answered their questions. Discharge instructions concerning home care and prescriptions have been given. The patient is STABLE and is discharged to home in good condition.  BP 109/70 mmHg  Pulse 68  Temp(Src) 98.2 F (36.8 C) (Oral)  Resp 16  SpO2 100%  LMP 10/25/2014  Meds ordered this encounter  Medications  . sodium chloride 0.9 % bolus 1,000 mL    Sig:   . morphine 4 MG/ML injection 4 mg    Sig:   . ondansetron (ZOFRAN) injection 4 mg    Sig:   . morphine 4 MG/ML injection 4 mg    Sig:   . naproxen (NAPROSYN) 500 MG tablet    Sig: Take 1 tablet (500 mg total) by mouth 2 (two) times daily as needed for mild  pain, moderate pain or headache (TAKE WITH MEALS.).    Dispense:  20 tablet    Refill:  0    Order Specific Question:  Supervising Provider    Answer:  Noemi Chapel D [[id number]]  . HYDROcodone-acetaminophen (NORCO) 5-325 MG per tablet    Sig: Take 1 tablet by mouth every 6 (six) hours as needed for severe pain.    Dispense:  6 tablet    Refill:  0    Order Specific Question:  Supervising Provider  Answer:  Noemi Chapel D [[id number]]  . ondansetron (ZOFRAN ODT) 4 MG disintegrating tablet    Sig: Take 1 tablet (4 mg total) by mouth every 8 (eight) hours as needed for nausea or vomiting.    Dispense:  15 tablet    Refill:  0    Order Specific Question:  Supervising Provider    Answer:  Johnna Acosta [address] [place], PA-C 11/12/14 1437  Mariea Clonts, MD 11/12/14 [phone]

## 2014-11-12 NOTE — ED Notes (Signed)
Pt with Hx of POTTS disease c/o dizziness x weeks, 1 episode of syncope yesterday, LOC x several hours. Pt states that her right ear is very painful, radiating down through right mouth and into back of right throat. Pt has difficulty walking, repeatedly drops things when she attempts to pick them up. Pt states she has been having memory problems, onset last week.

## 2014-11-13 ENCOUNTER — Encounter: Payer: Self-pay | Admitting: Internal Medicine

## 2014-11-15 ENCOUNTER — Telehealth: Payer: Self-pay | Admitting: Emergency Medicine

## 2014-11-15 NOTE — Telephone Encounter (Signed)
Pt given lab results with medication instructions to start taking vitamin d 50,000 units x 12 weeks Medication e-scribed to [facility]

## 2014-11-20 ENCOUNTER — Ambulatory Visit: Payer: Medicaid Other | Admitting: Internal Medicine

## 2014-11-22 ENCOUNTER — Ambulatory Visit (INDEPENDENT_AMBULATORY_CARE_PROVIDER_SITE_OTHER): Payer: Medicaid Other | Admitting: Neurology

## 2014-11-22 ENCOUNTER — Encounter: Payer: Self-pay | Admitting: Neurology

## 2014-11-22 VITALS — BP 112/72 | HR 80 | Temp 99.0°F | Ht 70.0 in | Wt 136.0 lb

## 2014-11-22 DIAGNOSIS — M5416 Radiculopathy, lumbar region: Secondary | ICD-10-CM

## 2014-11-22 DIAGNOSIS — M549 Dorsalgia, unspecified: Secondary | ICD-10-CM | POA: Insufficient documentation

## 2014-11-22 DIAGNOSIS — M792 Neuralgia and neuritis, unspecified: Secondary | ICD-10-CM | POA: Insufficient documentation

## 2014-11-22 HISTORY — DX: Radiculopathy, lumbar region: M54.16

## 2014-11-22 NOTE — Progress Notes (Signed)
[facility]    Provider:  Dr Jaynee Eagles Referring Provider: Lance Bosch, NP Primary Care Physician:  Chari Manning, NP  CC:  Low back pain  HPI:  [name] is a 28 y.o. female here as a referral from Dr. Feliciana Rossetti for Low back pain. She is a new patient to me but had seen Dr. Janann Colonel in the past.   She is here to follow up on back pain, worsening. She has pain in the tailbone and in the middle of her back. When it is back it radiates up the back. She has pressure. She can't take deept breaths, it hurts to breath and hard to expand her stomach. She has to get on her knees and try to pop something. Like someone is stabbing her in the back. Feels like the spine is curving. She can't touch her spine. It is daily, she can't sit or stand for long periods of time. Everything makes it worse, walking, standing, sitting. She sleeps with 4 pillows. She has weakness in the legs and arms her legs and feet start to swell and her knees start to give out. She is on Lyrica. She is on naproxen and taking both. The Lyrica used to help with the pain but doesn't so much anymore. She uses a back brace. Radicular symptoms are left > right. 10/10 severe pain.   Appointment 05/10/2014: [name] is a 28 y.o. female here as a referral from Dr. Feliciana Rossetti for seizure evaluation and headache pain.   Seizures started in 2009, initially was having 2-3 times a week. Was evaluated by a neurologist in [place], she is unsure what they found. She reports having had an EEG and imaging done, unsure what the tests showed. Was started on dilantin 300mg  daily, stopped in February due to running out of refills. PCP gave a refill yesterday and she restarted it today. Notes it has been around 8 months since her last seizure. She does not recall anything during the episode. Based on witness reports they appear to be GTC in nature. She reports when she was a child she had a "5lb tumor on her head", states it went away on  its own. No history of renal calculi or glaucoma. Has been diagnosed with PTSD, anxiety, depression.   Notes chronic pressure pain in her head. Described as bilateral, peri-orbital stabbing shooting pain. Having headaches 3 to 4 times a weeks. + Photo and phonophobia, + nausea and some emesis. With headaches gets some blurry visions. Headaches can last all day to multiple days. Gets some generalized weakness and paresthesias with the headache. Also notes severe back and diffuse MSK pain, chronic and constant.   Notes episodes of sinus tachycardia, reports being diagnosed with POTS. Followed by Dr Caryl Comes in cardiology.   Reviewed notes, labs and imaging from outside physicians, which showed: B12, cbc, bmp, hiv, ana, ck,tsh all wnl  Review of Systems: Patient complains of symptoms per HPI as well as the following symptoms: fatigue, blurred vision, eye pain, chest pain, palpitations, swelling in legs, SOB, wheezing, feeling hot, feeling cold, increased thirst, ringing in ears, trouble swallowing, joint pain, joint swelling, cramps, aching muscles, headache, insomnia, restless legs, seizure, passing out, depression, anxiety, not enough sleep, decreased energy, change in appetite, disinterest in activities.    . Pertinent negatives per HPI. All others negative.   History   Social History  . Marital Status: Single    Spouse Name: N/A    Number of Children: N/A  .  Years of Education: N/A   Occupational History  . Not on file.   Social History Main Topics  . Smoking status: Former Smoker    Quit date: 06/23/2012  . Smokeless tobacco: Never Used  . Alcohol Use: Yes     Comment: ocassionally  . Drug Use: Yes    Special: Marijuana  . Sexual Activity: Not on file   Other Topics Concern  . Not on file   Social History Narrative   Patient is single, currently raising her nephews   Patient is right handed   Patient's education level is high school graduate   Patient doesn't drink caffeine            Family History  Problem Relation Age of Onset  . Heart attack Mother   . Heart disease Mother   . Sudden death Mother   . Sudden death Sister   . Heart attack Sister   . Sudden death Maternal Grandmother   . Fainting Maternal Grandmother     Past Medical History  Diagnosis Date  . Seizures   . Depression   . Panic attack   . Hypertension   . Heart murmur   . Arrhythmia   . PTSD (post-traumatic stress disorder)   . 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  Current Outpatient Prescriptions  Medication Sig Dispense Refill  . albuterol (PROVENTIL HFA;VENTOLIN HFA) 108 (90 BASE) MCG/ACT inhaler Inhale 2 puffs into the lungs every 4 (four) hours as needed for wheezing or shortness of breath. 1 Inhaler 0  . gabapentin (NEURONTIN) 300 MG capsule Take 100 mg by mouth 3 (three) times daily.     Marland Kitchen HYDROcodone-acetaminophen (NORCO) 5-325 MG per tablet Take 1 tablet by mouth every 6 (six) hours as needed for severe pain. 6 tablet 0  . naproxen (NAPROSYN) 500 MG tablet Take 1 tablet (500 mg total) by mouth 2 (two) times daily as needed for mild pain, moderate pain or headache (TAKE WITH MEALS.). 20 tablet 0  . omeprazole (PRILOSEC) 40 MG capsule Take 1 capsule (40 mg total) by mouth daily. 30 capsule 3  . ondansetron (ZOFRAN ODT) 4 MG disintegrating tablet Take 1 tablet (4 mg total) by mouth every 8 (eight) hours as needed for nausea or vomiting. 15 tablet 0  . predniSONE (DELTASONE) 20 MG tablet Take 1 tablet (20 mg total) by mouth daily with breakfast. (Patient taking differently: Take 20 mg by mouth daily as needed (for inflamation). ) 5 tablet 0  . pregabalin (LYRICA) 75 MG capsule Take 1 capsule (75 mg total) by mouth 2 (two) times daily. 60 capsule 3  . promethazine (PHENERGAN) 12.5 MG tablet Take 1 tablet (12.5 mg total) by mouth every 6 (six) hours as needed for nausea or vomiting. 30 tablet 0  . propranolol ER (INDERAL LA) 80  MG 24 hr capsule Take 1 capsule (80 mg total) by mouth daily. 30 capsule 4  . sertraline (ZOLOFT) 100 MG tablet Take 100 mg by mouth daily.    . traMADol (ULTRAM) 50 MG tablet Take 1 tablet (50 mg total) by mouth every 12 (twelve) hours as needed. (Patient not taking: Reported on 10/23/2014) 60 tablet 0  . Vitamin D, Ergocalciferol, (DRISDOL) 50000 UNITS CAPS capsule Take 1 capsule (50,000 Units total) by mouth every 7 (seven) days. 12 capsule 0  . zolpidem (AMBIEN) 10 MG tablet Take 10 mg by mouth at bedtime as needed for sleep.  No current facility-administered medications for this visit.    Allergies as of 11/22/2014 - Review Complete 11/12/2014  Allergen Reaction Noted  . Aspirin Hives     Vitals: LMP 10/25/2014 Last Weight:  Wt Readings from Last 1 Encounters:  10/26/14 138 lb (62.596 kg)   Last Height:   Ht Readings from Last 1 Encounters:  10/26/14 5\' 10"  (1.778 m)   Physical exam: Exam: Gen: NAD, conversant, well nourised, well groomed                     CV: RRR, no MRG. No Carotid Bruits. No peripheral edema, warm, nontender Eyes: Conjunctivae clear without exudates or hemorrhage MSK: Tendernes to light touch in any area in her lumbar, thoracic spine or paraspinal areas, appears exaggerated.   Neuro: Detailed Neurologic Exam  Speech:    Speech is normal; fluent and spontaneous with normal comprehension.  Cognition:    The patient is oriented to person, place, and time;     recent and remote memory intact;     language fluent;     normal attention, concentration,     fund of knowledge Cranial Nerves:    The pupils are equal, round, and reactive to light. The fundi are normal and spontaneous venous pulsations are present. Visual fields are full to finger confrontation. Extraocular movements are intact. Trigeminal sensation is intact and the muscles of mastication are normal. The face is symmetric. The palate elevates in the midline. Voice is normal. Hearing is  intact. Shoulder shrug is normal. The tongue has normal motion without fasciculations.   Coordination:    Normal finger to nose and heel to shin. Normal rapid alternating movements.   Gait:    Heel-toe and tandem gait are normal.   Motor Observation:    No asymmetry, no atrophy, and no involuntary movements noted. Tone:    Normal muscle tone.    Posture:    Posture is normal. normal erect    Strength:    Giveway throughout     Sensation:     Intact to LT  Reflex Exam:  DTR's:    Deep tendon reflexes in the upper and lower extremities are normal bilaterally.   Toes:    The toes are downgoing bilaterally.   Clonus:    Clonus is absent.       Assessment/Plan:  27y/o woman presenting for follow up on chronic LBP. PMHx seizure disorder, headache and generalized MSK pain. Unclear etiology of seizures, requested that pain obtain prior records and workup(this was requested by Dr. Janann Colonel but there is no record of documents being delivered here so will request that patient brings them in again). Headaches appear migrainous in nature per records. Unclear etiology of MSK pain, appears to be some exaggeration of her symptoms. Appears to be MSK in nature. As pain is focused mainly in Lumbar and thoracic region Will order MRI of the lumbar and thoracic areas, no MRI appears to have been complete din the past. Will continue Lyrica 75mg  BID for her migraines and headache, she says she does not want to have it increased because it makes her sleepy. Follow up when images completed.    Sarina Ill, MD  [facility] [address] [place], [place] [address]  Phone [phone] Fax [phone]

## 2014-11-22 NOTE — Patient Instructions (Signed)
Overall you are doing fairly well but I do want to suggest a few things today:   Remember to drink plenty of fluid, eat healthy meals and do not skip any meals. Try to eat protein with a every meal and eat a healthy snack such as fruit or nuts in between meals. Try to keep a regular sleep-wake schedule and try to exercise daily, particularly in the form of walking, 20-30 minutes a day, if you can.   As far as your medications are concerned, I would like to suggest  As far as diagnostic testing: MRI of the lumbar and thoracic spine  Please call us with any interim questions, concerns, problems, updates or refill requests.   Please also call us for any test results so we can go over those with you on the phone.  My clinical assistant and will answer any of your questions and relay your messages to me and also relay most of my messages to you.   Our phone number is [phone]. We also have an after hours call service for urgent matters and there is a physician on-call for urgent questions. For any emergencies you know to call 911 or go to the nearest emergency room

## 2014-11-23 ENCOUNTER — Ambulatory Visit: Payer: Medicaid Other | Admitting: Internal Medicine

## 2014-11-28 ENCOUNTER — Encounter: Payer: Self-pay | Admitting: Internal Medicine

## 2014-12-07 ENCOUNTER — Ambulatory Visit: Payer: Medicaid Other | Admitting: Internal Medicine

## 2014-12-08 ENCOUNTER — Other Ambulatory Visit: Payer: Medicaid Other

## 2014-12-08 ENCOUNTER — Inpatient Hospital Stay: Admission: RE | Admit: 2014-12-08 | Payer: Medicaid Other | Source: Ambulatory Visit

## 2014-12-20 ENCOUNTER — Encounter (HOSPITAL_COMMUNITY): Payer: Self-pay | Admitting: Emergency Medicine

## 2014-12-20 ENCOUNTER — Emergency Department (HOSPITAL_COMMUNITY)
Admission: EM | Admit: 2014-12-20 | Discharge: 2014-12-20 | Disposition: A | Discharge status: Home / Self Care | Payer: Medicaid Other | Attending: Emergency Medicine | Admitting: Emergency Medicine

## 2014-12-20 DIAGNOSIS — F329 Major depressive disorder, single episode, unspecified: Secondary | ICD-10-CM | POA: Diagnosis not present

## 2014-12-20 DIAGNOSIS — F410 Panic disorder [episodic paroxysmal anxiety] without agoraphobia: Secondary | ICD-10-CM | POA: Diagnosis not present

## 2014-12-20 DIAGNOSIS — Z7952 Long term (current) use of systemic steroids: Secondary | ICD-10-CM | POA: Insufficient documentation

## 2014-12-20 DIAGNOSIS — Z79899 Other long term (current) drug therapy: Secondary | ICD-10-CM | POA: Insufficient documentation

## 2014-12-20 DIAGNOSIS — Z008 Encounter for other general examination: Secondary | ICD-10-CM | POA: Diagnosis present

## 2014-12-20 DIAGNOSIS — F32A Depression, unspecified: Secondary | ICD-10-CM

## 2014-12-20 DIAGNOSIS — F4310 Post-traumatic stress disorder, unspecified: Secondary | ICD-10-CM | POA: Diagnosis not present

## 2014-12-20 DIAGNOSIS — Z87891 Personal history of nicotine dependence: Secondary | ICD-10-CM | POA: Diagnosis not present

## 2014-12-20 DIAGNOSIS — Z791 Long term (current) use of non-steroidal anti-inflammatories (NSAID): Secondary | ICD-10-CM | POA: Diagnosis not present

## 2014-12-20 DIAGNOSIS — F1210 Cannabis abuse, uncomplicated: Secondary | ICD-10-CM | POA: Diagnosis not present

## 2014-12-20 DIAGNOSIS — I10 Essential (primary) hypertension: Secondary | ICD-10-CM | POA: Diagnosis not present

## 2014-12-20 DIAGNOSIS — R011 Cardiac murmur, unspecified: Secondary | ICD-10-CM | POA: Insufficient documentation

## 2014-12-20 LAB — SALICYLATE LEVEL: Salicylate Lvl: <4 mg/dL (ref 2.8–20.0)

## 2014-12-20 LAB — COMPREHENSIVE METABOLIC PANEL
ALT: 10 U/L (ref 0–35)
AST: 18 U/L (ref 0–37)
Albumin: 4.8 g/dL (ref 3.5–5.2)
Alkaline Phosphatase: 60 U/L (ref 39–117)
Anion gap: 10 (ref 5–15)
BUN: 14 mg/dL (ref 6–23)
CO2: 26 mmol/L (ref 19–32)
Calcium: 9.3 mg/dL (ref 8.4–10.5)
Chloride: 106 mmol/L (ref 96–112)
Creatinine, Ser: 0.68 mg/dL (ref 0.50–1.10)
GFR calc Af Amer: >90 mL/min (ref >90)
GFR calc non Af Amer: >90 mL/min (ref >90)
Glucose, Bld: 101 mg/dL — ABNORMAL HIGH (ref 70–99)
Potassium: 3.6 mmol/L (ref 3.5–5.1)
Sodium: 142 mmol/L (ref 135–145)
Total Bilirubin: 1.1 mg/dL (ref 0.3–1.2)
Total Protein: 7.8 g/dL (ref 6.0–8.3)

## 2014-12-20 LAB — ACETAMINOPHEN LEVEL: Acetaminophen (Tylenol), Serum: <10 ug/mL — ABNORMAL LOW (ref 10–30)

## 2014-12-20 LAB — CBC
HCT: 38.8 % (ref 36.0–46.0)
Hemoglobin: 13.3 g/dL (ref 12.0–15.0)
MCH: 31 pg (ref 26.0–34.0)
MCHC: 34.3 g/dL (ref 30.0–36.0)
MCV: 90.4 fL (ref 78.0–100.0)
Platelets: 258 10*3/uL (ref 150–400)
RBC: 4.29 MIL/uL (ref 3.87–5.11)
RDW: 12.3 % (ref 11.5–15.5)
WBC: 4.9 10*3/uL (ref 4.0–10.5)

## 2014-12-20 LAB — RAPID URINE DRUG SCREEN, HOSP PERFORMED
Amphetamines: NOT DETECTED
Barbiturates: NOT DETECTED
Benzodiazepines: NOT DETECTED
Cocaine: NOT DETECTED
Opiates: NOT DETECTED
Tetrahydrocannabinol: POSITIVE — AB

## 2014-12-20 LAB — ETHANOL: Alcohol, Ethyl (B): <5 mg/dL (ref 0–9)

## 2014-12-20 MED ORDER — ALUM & MAG HYDROXIDE-SIMETH 200-200-20 MG/5ML PO SUSP: 30.0000 mL | ORAL | Status: DC | PRN

## 2014-12-20 MED ORDER — ACETAMINOPHEN 325 MG PO TABS: 650.0000 mg | ORAL_TABLET | ORAL | Status: DC | PRN

## 2014-12-20 MED ORDER — ONDANSETRON HCL 4 MG PO TABS: 4.0000 mg | ORAL_TABLET | Freq: Three times a day (TID) | ORAL | Status: DC | PRN
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
## 2014-12-20 NOTE — ED Notes (Signed)
TTS at bedside. 

## 2014-12-20 NOTE — ED Notes (Signed)
Bed: [facility] Expected date:  Expected time:  Means of arrival:  Comments: Pt still in room

## 2014-12-20 NOTE — Discharge Instructions (Signed)
Return here as needed. Follow up with your doctor. °
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
## 2014-12-20 NOTE — ED Provider Notes (Signed)
CSN: [id number]     Arrival date & time 12/20/14  1524 History   First MD Initiated Contact with Patient 12/20/14 1536     Chief Complaint  Patient presents with  . IVC      (Consider location/radiation/quality/duration/timing/severity/associated sxs/prior Treatment) HPI  Patient presents to the emergency department under IVC because the person to get the paperwork stated that she was talking about harming her sister's ex-boyfriend.  Patient states that she does not want to harm anybody.  She said that she is mad about the situation, where she is not able to see her sister's children.  Her sister has passed away.  The patient states that she does not want to harm her self.  She says she has had thoughts in the past to harm herself but does not have any current thoughts.   Past Medical History  Diagnosis Date  . Seizures   . Depression   . Panic attack   . Hypertension   . Heart murmur   . Arrhythmia   . PTSD (post-traumatic stress disorder)   . 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 Back pain   . Lumbar radiculopathy    No past surgical history on file. Family History  Problem Relation Age of Onset  . Heart attack Mother   . Heart disease Mother   . Sudden death Mother   . Sudden death Sister   . Heart attack Sister   . Sudden death Maternal Grandmother   . Fainting Maternal Grandmother    History  Substance Use Topics  . Smoking status: Former Smoker    Quit date: 06/23/2012  . Smokeless tobacco: Never Used  . Alcohol Use: Yes     Comment: ocassionally   OB History    No data available     Review of Systems  All other systems negative except as documented in the HPI. All pertinent positives and negatives as reviewed in the HPI.  Allergies  Aspirin  Home Medications   Prior to Admission medications   Medication Sig Start Date End Date Taking? Authorizing Provider  albuterol (PROVENTIL HFA;VENTOLIN HFA) 108 (90 BASE) MCG/ACT inhaler Inhale 2  puffs into the lungs every 4 (four) hours as needed for wheezing or shortness of breath. 06/11/14  Yes Carlisle Cater, PA-C  gabapentin (NEURONTIN) 300 MG capsule Take 300 mg by mouth 3 (three) times daily.    Yes Historical Provider, MD  naproxen (NAPROSYN) 500 MG tablet Take 1 tablet (500 mg total) by mouth 2 (two) times daily as needed for mild pain, moderate pain or headache (TAKE WITH MEALS.). 11/12/14  Yes Mercedes Strupp Camprubi-Soms, PA-C  omeprazole (PRILOSEC) 40 MG capsule Take 1 capsule (40 mg total) by mouth daily. 03/29/14  Yes Willia Craze, NP  ondansetron (ZOFRAN ODT) 4 MG disintegrating tablet Take 1 tablet (4 mg total) by mouth every 8 (eight) hours as needed for nausea or vomiting. 11/12/14  Yes Mercedes Strupp Camprubi-Soms, PA-C  predniSONE (DELTASONE) 20 MG tablet Take 1 tablet (20 mg total) by mouth daily with breakfast. Patient taking differently: Take 20 mg by mouth daily as needed (for inflamation).  06/15/14  Yes Lance Bosch, NP  pregabalin (LYRICA) 75 MG capsule Take 1 capsule (75 mg total) by mouth 2 (two) times daily. 05/10/14  Yes Hulen Luster, DO  promethazine (PHENERGAN) 12.5 MG tablet Take 1 tablet (12.5 mg total) by mouth every 6 (six) hours as needed for nausea or vomiting. 03/29/14  Yes Willia Craze, NP  propranolol ER (INDERAL LA) 80 MG 24 hr capsule Take 1 capsule (80 mg total) by mouth daily. 08/16/14  Yes Deboraha Sprang, MD  sertraline (ZOLOFT) 100 MG tablet Take 100 mg by mouth daily.   Yes Historical Provider, MD  Vitamin D, Ergocalciferol, (DRISDOL) 50000 UNITS CAPS capsule Take 1 capsule (50,000 Units total) by mouth every 7 (seven) days. 10/31/14  Yes Lance Bosch, NP  HYDROcodone-acetaminophen (NORCO) 5-325 MG per tablet Take 1 tablet by mouth every 6 (six) hours as needed for severe pain. 11/12/14   Mercedes Strupp Camprubi-Soms, PA-C  traMADol (ULTRAM) 50 MG tablet Take 1 tablet (50 mg total) by mouth every 12 (twelve) hours as needed. 08/03/14    Lance Bosch, NP   BP 124/78 mmHg  Pulse 80  Temp(Src) 98.4 F (36.9 C) (Oral)  Resp 16  SpO2 100%  LMP 12/13/2014 Physical Exam  Constitutional: She is oriented to person, place, and time. She appears well-developed and well-nourished. No distress.  Eyes: Pupils are equal, round, and reactive to light.  Neck: Normal range of motion. Neck supple.  Cardiovascular: Normal rate, regular rhythm and normal heart sounds.  Exam reveals no gallop and no friction rub.   No murmur heard. Pulmonary/Chest: Effort normal and breath sounds normal. No respiratory distress.  Neurological: She is alert and oriented to person, place, and time.  Skin: Skin is warm and dry.  Psychiatric: Her speech is normal and behavior is normal. Judgment and thought content normal. Her mood appears not anxious. Her affect is not angry, not labile and not inappropriate. Thought content is not delusional. Cognition and memory are normal. She does not exhibit a depressed mood. She expresses no homicidal and no suicidal ideation. She expresses no suicidal plans and no homicidal plans.    ED Course  Procedures (including critical care time) Labs Review Labs Reviewed  ACETAMINOPHEN LEVEL - Abnormal; Notable for the following:    Acetaminophen (Tylenol), Serum <10.0 (*)    All other components within normal limits  COMPREHENSIVE METABOLIC PANEL - Abnormal; Notable for the following:    Glucose, Bld 101 (*)    All other components within normal limits  CBC  ETHANOL  SALICYLATE LEVEL  URINE RAPID DRUG SCREEN (HOSP PERFORMED)    The patient does not have any definitive outward distress that she is making while I am interviewing her.  I had the behavioral health team assessor as well.  They did not feel that she has voiced any specific threats or harm towards anybody.  The patient is not suicidal and has not voiced any homicidal ideation.  She was under involuntary commitment for homicidal thoughts towards her sister's  ex-boyfriend.  The patient emphatically denies that she wants to kill anybody.  I feel the patient can be discharged home in her IVC paperwork rescinded   MDM   Final diagnoses:  None       Brent General, PA-C 12/20/14 [place], MD 12/27/14 [phone]

## 2014-12-20 NOTE — BHH Counselor (Signed)
Writer informed TTS [place] of the assessment.

## 2014-12-20 NOTE — ED Notes (Addendum)
Pt escorted to discharge window. Verbalized understanding discharge instructions. In no acute distress.   Pt given follow-up information.

## 2014-12-22 ENCOUNTER — Telehealth: Payer: Self-pay | Admitting: Neurology

## 2014-12-22 ENCOUNTER — Other Ambulatory Visit: Payer: Self-pay | Admitting: Neurology

## 2014-12-22 MED ORDER — PREGABALIN 75 MG PO CAPS: 75.0000 mg | ORAL_CAPSULE | Freq: Three times a day (TID) | ORAL | Status: DC

## 2014-12-22 NOTE — Telephone Encounter (Signed)
Patient MRI was denied and she is having worsening pain. Patient would like to know what to do. Please advise.

## 2014-12-22 NOTE — Telephone Encounter (Signed)
Called patient advised to increase Lyrica 75mg  to three times daily per Dr. Jaynee Eagles. Patient verbalized understanding.

## 2014-12-22 NOTE — Telephone Encounter (Signed)
Patient requesting another appointment with Dr. Jaynee Eagles.  Was unable to get MRI's done due to MCD denied.  Patient has worsened.  Please call and advise.

## 2014-12-22 NOTE — Telephone Encounter (Signed)
[name] - Please call and ask patient to increase Lyrica to 75mg  tid and I will increase her prescription thank you.

## 2014-12-25 ENCOUNTER — Ambulatory Visit: Payer: Medicaid Other | Admitting: Internal Medicine

## 2015-01-02 ENCOUNTER — Encounter: Payer: Self-pay | Admitting: Internal Medicine

## 2015-01-03 ENCOUNTER — Ambulatory Visit (INDEPENDENT_AMBULATORY_CARE_PROVIDER_SITE_OTHER): Payer: Medicaid Other | Admitting: Neurology

## 2015-01-03 ENCOUNTER — Encounter: Payer: Self-pay | Admitting: Neurology

## 2015-01-03 ENCOUNTER — Ambulatory Visit: Payer: Medicaid Other | Admitting: *Deleted

## 2015-01-03 VITALS — BP 124/84 | HR 86 | Temp 99.1°F | Ht 70.0 in | Wt 134.0 lb

## 2015-01-03 DIAGNOSIS — G43909 Migraine, unspecified, not intractable, without status migrainosus: Secondary | ICD-10-CM

## 2015-01-03 DIAGNOSIS — H53132 Sudden visual loss, left eye: Secondary | ICD-10-CM

## 2015-01-03 DIAGNOSIS — R51 Headache: Secondary | ICD-10-CM

## 2015-01-03 DIAGNOSIS — H93232 Hyperacusis, left ear: Secondary | ICD-10-CM

## 2015-01-03 DIAGNOSIS — R519 Headache, unspecified: Secondary | ICD-10-CM

## 2015-01-03 NOTE — Progress Notes (Signed)
[facility]    Provider:  Dr Jaynee [name] Referring Provider: Lance Bosch, NP Primary Care Physician:  Maria Manning, NP  CC:  migraine, seizure and generalized pain  HPI: 29 yo patient here for follow up. PMHx depression and anxiety. Within the last week or 2 her lyrica was increased for her extreme chronic pain. From her neck down she has pain. Her legs have been extremely painful and she is falling. Lyrica 75mg  three times a day for the pain. The pain goes into her limbs, she feels like she is having a heart attack it is so bad, she has tingling in the fingers like someone is stabbing her, she has ringing in the ears. Her vision is blurry, she can be driving and things look smaller or bigger or she gets a glare over her eyes. She was told by her surgeon that he spine is perfect. Worsening headaches, vision loss, hearing changes. Headache is severe, is throbbing in her scalp. She thinks she may have MS.    11/22/2014: [name] is a 29 y.o. female PMHx depression and anxiety who is here as a referral from [name] for Low back pain. She is a new patient to me but had seen [name] in the past.   She is here to follow up on back pain, worsening. She has pain in the tailbone and in the middle of her back. When it is back it radiates up the back. She has pressure. She can't take deept breaths, it hurts to breath and hard to expand her stomach. She has to get on her knees and try to pop something. Like someone is stabbing her in the back. Feels like the spine is curving. She can't touch her spine. It is daily, she can't sit or stand for long periods of time. Everything makes it worse, walking, standing, sitting. She sleeps with 4 pillows. She has weakness in the legs and arms her legs and feet start to swell and her knees start to give out. She is on Lyrica. She is on naproxen and taking both. The Lyrica used to help with the pain but doesn't so much anymore. She uses a back  brace. Radicular symptoms are left > right. 10/10 severe pain.   Appointment 05/10/2014: [name] is a 29 y.o. female here as a referral from [name] for seizure evaluation and headache pain.   Seizures started in 2009, initially was having 2-3 times a week. Was evaluated by a neurologist in [place], she is unsure what they found. She reports having had an EEG and imaging done, unsure what the tests showed. Was started on dilantin 300mg  daily, stopped in February due to running out of refills. PCP gave a refill yesterday and she restarted it today. Notes it has been around 8 months since her last seizure. She does not recall anything during the episode. Based on witness reports they appear to be GTC in nature. She reports when she was a child she had a "5lb tumor on her head", states it went away on its own. No history of renal calculi or glaucoma. Has been diagnosed with PTSD, anxiety, depression.   Notes chronic pressure pain in her head. Described as bilateral, peri-orbital stabbing shooting pain. Having headaches 3 to 4 times a weeks. + Photo and phonophobia, + nausea and some emesis. With headaches gets some blurry visions. Headaches can last all day to multiple days. Gets some generalized weakness and paresthesias with the headache.  Also notes severe back and diffuse MSK pain, chronic and constant.   Notes episodes of sinus tachycardia, reports being diagnosed with POTS. Followed by [name] in cardiology.   Reviewed notes, labs and imaging from outside physicians, which showed: B12, cbc, bmp, hiv, ana, ck,tsh all wnl  Review of Systems: Patient complains of symptoms per HPI as well as the following symptoms: appetite change, fatigue, eye discharge and itching, excessive thirst, itching, SOB, abd pain, constipation, vomiting, leg swelling, palpitations, incontinence, back pain, walking problems, headache, depression, anxiety. Pertinent negatives per HPI. All others negative.   History    Social History  . Marital Status: Single    Spouse Name: N/A  . Number of Children: N/A  . Years of Education: N/A   Occupational History  . Not on file.   Social History Main Topics  . Smoking status: Former Smoker    Quit date: 06/23/2012  . Smokeless tobacco: Never Used  . Alcohol Use: Yes     Comment: ocassionally  . Drug Use: Yes    Special: Marijuana  . Sexual Activity: Not on file   Other Topics Concern  . Not on file   Social History Narrative   Patient is single, currently raising her nephews   Patient is right handed   Patient's education level is high school graduate   Patient doesn't drink caffeine          Family History  Problem Relation Age of Onset  . Heart attack Mother   . Heart disease Mother   . Sudden death Mother   . Sudden death Sister   . Heart attack Sister   . Sudden death Maternal Grandmother   . Fainting Maternal Grandmother     Past Medical History  Diagnosis Date  . Seizures   . Depression   . Panic attack   . Hypertension   . Heart murmur   . Arrhythmia   . PTSD (post-traumatic stress disorder)   . 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 Back pain   . Lumbar radiculopathy     History reviewed. No pertinent past surgical history.  Current Outpatient Prescriptions  Medication Sig Dispense Refill  . albuterol (PROVENTIL HFA;VENTOLIN HFA) 108 (90 BASE) MCG/ACT inhaler Inhale 2 puffs into the lungs every 4 (four) hours as needed for wheezing or shortness of breath. 1 Inhaler 0  . gabapentin (NEURONTIN) 300 MG capsule Take 300 mg by mouth 3 (three) times daily.     Marland Kitchen loxapine (LOXITANE) 25 MG capsule Take 1 capsule by mouth at bedtime as needed and may repeat dose one time if needed.  0  . naproxen (NAPROSYN) 500 MG tablet Take 1 tablet (500 mg total) by mouth 2 (two) times daily as needed for mild pain, moderate pain or headache (TAKE WITH MEALS.). 20 tablet 0  . omeprazole (PRILOSEC) 40 MG capsule Take 1  capsule (40 mg total) by mouth daily. 30 capsule 3  . ondansetron (ZOFRAN ODT) 4 MG disintegrating tablet Take 1 tablet (4 mg total) by mouth every 8 (eight) hours as needed for nausea or vomiting. 15 tablet 0  . pregabalin (LYRICA) 75 MG capsule Take 1 capsule (75 mg total) by mouth 3 (three) times daily. 90 capsule 6  . promethazine (PHENERGAN) 12.5 MG tablet Take 1 tablet (12.5 mg total) by mouth every 6 (six) hours as needed for nausea or vomiting. 30 tablet 0  . propranolol ER (INDERAL LA) 80 MG 24 hr capsule Take  1 capsule (80 mg total) by mouth daily. 30 capsule 4  . sertraline (ZOLOFT) 100 MG tablet Take 100 mg by mouth daily.    . Vitamin D, Ergocalciferol, (DRISDOL) 50000 UNITS CAPS capsule Take 1 capsule (50,000 Units total) by mouth every 7 (seven) days. 12 capsule 0  . HYDROcodone-acetaminophen (NORCO) 5-325 MG per tablet Take 1 tablet by mouth every 6 (six) hours as needed for severe pain. (Patient not taking: Reported on 01/03/2015) 6 tablet 0  . predniSONE (DELTASONE) 20 MG tablet Take 1 tablet (20 mg total) by mouth daily with breakfast. (Patient not taking: Reported on 01/03/2015) 5 tablet 0  . traMADol (ULTRAM) 50 MG tablet Take 1 tablet (50 mg total) by mouth every 12 (twelve) hours as needed. (Patient not taking: Reported on 01/03/2015) 60 tablet 0   No current facility-administered medications for this visit.    Allergies as of 01/03/2015 - Review Complete 01/03/2015  Allergen Reaction Noted  . Aspirin Hives     Vitals: BP 124/84 mmHg  Pulse 86  Temp(Src) 99.1 F (37.3 C) (Oral)  Ht 5\' 10"  (1.778 m)  Wt 134 lb (60.782 kg)  BMI 19.23 kg/m2  LMP 12/13/2014 Last Weight:  Wt Readings from Last 1 Encounters:  01/03/15 134 lb (60.782 kg)   Last Height:   Ht Readings from Last 1 Encounters:  01/03/15 5\' 10"  (1.778 m)   Physical exam: Exam: Gen: She is laying down in the dark in an exam room with headache pain, conversant, well nourised, ell groomed                      CV: RRR, no MRG. No Carotid Bruits. No peripheral edema, warm, nontender Eyes: Conjunctivae clear without exudates or hemorrhage  Neuro: Detailed Neurologic Exam  Speech:    Speech is normal; fluent and spontaneous with normal comprehension.  Cognition:    The patient is oriented to person, place, and time;     recent and remote memory intact;     language fluent;     normal attention, concentration,     fund of knowledge Cranial Nerves:    The pupils are equal, round, and reactive to light. The fundi are normal and spontaneous venous pulsations are present. Visual fields are full to finger confrontation. Extraocular movements are intact. Trigeminal sensation is intact and the muscles of mastication are normal. The face is symmetric. The palate elevates in the midline. Hearing intact. Voice is normal. Shoulder shrug is normal. The tongue has normal motion without fasciculations.   Coordination:    Normal finger to nose and heel to shin. Normal rapid alternating movements.   Gait:    Heel-toe and tandem gait are normal.   Motor Observation:    No asymmetry, no atrophy, and no involuntary movements noted. Tone:    Normal muscle tone.    Posture:    Posture is normal. normal erect    Strength:    Giveway throughout      Sensation: intact to LT     Reflex Exam:  DTR's:    Deep tendon reflexes in the upper and lower extremities are normal bilaterally.   Toes:    The toes are downgoing bilaterally.   Clonus:    Clonus is absent.   Assessment/Plan: 29y/o woman presenting for follow up on chronic pain, seizures, migraine. PMHx depression, anxiety, seizure disorder, headache and generalized MSK pain. Unclear etiology of seizures, requested prior records and workup(this was requested by [name]  but there is no record of documents being delivered here so will request that patient brings them in again, she has not brought them). Headaches appear migrainous in nature per  records. Unclear etiology of MSK pain, appears to be some exaggeration of her symptoms. Appears to be MSK in nature.  Will continue Lyrica 75mg  TID for her migraines and headache. Will order an MRI of the brain due to worsening headache, vision changes and hearing changes. Patient's headache improved after migraine cocktail today. Follow up when images completed.    Sarina Ill, MD  [facility] [address] [place], [place] [address]  Phone [phone] Fax [phone]  A total of 20 minutes was spent face-to-face with this patient. Over half this time was spent on counseling patient on the headache/migraine diagnosis and different diagnostic and therapeutic options available.

## 2015-01-03 NOTE — Patient Instructions (Signed)
Overall you are doing fairly well but I do want to suggest a few things today:   Remember to drink plenty of fluid, eat healthy meals and do not skip any meals. Try to eat protein with a every meal and eat a healthy snack such as fruit or nuts in between meals. Try to keep a regular sleep-wake schedule and try to exercise daily, particularly in the form of walking, 20-30 minutes a day, if you can.   As far as your medications are concerned, I would like to suggest: Continue Lyrica 75mg  three times daily. Can consider increasing to 150mg  twice daily if needed  As far as diagnostic testing: MRI of the brain w/wo contrast  I would like to see you back in 4 months, sooner if we need to. Please call us with any interim questions, concerns, problems, updates or refill requests.   Please also call us for any test results so we can go over those with you on the phone.  My clinical assistant and will answer any of your questions and relay your messages to me and also relay most of my messages to you.   Our phone number is [phone]. We also have an after hours call service for urgent matters and there is a physician on-call for urgent questions. For any emergencies you know to call 911 or go to the nearest emergency room

## 2015-01-03 NOTE — Addendum Note (Signed)
Addended by: Sarina Ill B on: 01/03/2015 10:37 PM   Modules accepted: Level of Service

## 2015-01-04 MED ORDER — KETOROLAC TROMETHAMINE 30 MG/ML IJ SOLN
30.0000 mg | Freq: Once | INTRAMUSCULAR | Status: AC
  Administered 2015-01-03: 30 mg via INTRAVENOUS

## 2015-01-04 MED ORDER — SODIUM CHLORIDE 0.9 % IV SOLN
500.0000 mg | INTRAVENOUS | Status: DC
  Administered 2015-01-03: 500 mg via INTRAVENOUS

## 2015-01-04 MED ORDER — METHYLPREDNISOLONE SODIUM SUCC 500 MG IJ SOLR
500.0000 mg | INTRAMUSCULAR | Status: DC
  Administered 2015-01-03: 500 mg via INTRAVENOUS

## 2015-01-04 MED ORDER — PROCHLORPERAZINE EDISYLATE 5 MG/ML IJ SOLN
10.0000 mg | Freq: Once | INTRAMUSCULAR | Status: AC
  Administered 2015-01-03: 10 mg via INTRAVENOUS

## 2015-01-04 NOTE — Progress Notes (Signed)
Patient received compazine 10mg , solumedrol 500mg , toradol 30mg , and depacon 1000mg . Blood pressure was 118/70 and pulse was 70 before starting. Patient rated headache at a "10/10" After infusion was complete, blood pressure was 124/75 and pulse 81. Patient stated headache was at a "6/10" on the pain scale and this was acceptable for her. Patient stated she has had Toradol in the past and has no allergy to it. Patient tolerated infusion well.

## 2015-01-17 ENCOUNTER — Telehealth: Payer: Self-pay | Admitting: Internal Medicine

## 2015-01-17 NOTE — Telephone Encounter (Signed)
Pt requesting an appt but would like to switch providers. Scheduled pt with Dr Adrian Blackwater in a 54min slot.

## 2015-01-21 ENCOUNTER — Emergency Department (HOSPITAL_COMMUNITY)
Admission: EM | Admit: 2015-01-21 | Discharge: 2015-01-21 | Disposition: A | Discharge status: Home / Self Care | Payer: Medicaid Other | Attending: Emergency Medicine | Admitting: Emergency Medicine

## 2015-01-21 ENCOUNTER — Encounter (HOSPITAL_COMMUNITY): Payer: Self-pay | Admitting: Emergency Medicine

## 2015-01-21 DIAGNOSIS — Z791 Long term (current) use of non-steroidal anti-inflammatories (NSAID): Secondary | ICD-10-CM | POA: Insufficient documentation

## 2015-01-21 DIAGNOSIS — R51 Headache: Secondary | ICD-10-CM | POA: Diagnosis present

## 2015-01-21 DIAGNOSIS — F410 Panic disorder [episodic paroxysmal anxiety] without agoraphobia: Secondary | ICD-10-CM | POA: Diagnosis not present

## 2015-01-21 DIAGNOSIS — R011 Cardiac murmur, unspecified: Secondary | ICD-10-CM | POA: Diagnosis not present

## 2015-01-21 DIAGNOSIS — Z8739 Personal history of other diseases of the musculoskeletal system and connective tissue: Secondary | ICD-10-CM | POA: Diagnosis not present

## 2015-01-21 DIAGNOSIS — Z7952 Long term (current) use of systemic steroids: Secondary | ICD-10-CM | POA: Diagnosis not present

## 2015-01-21 DIAGNOSIS — F329 Major depressive disorder, single episode, unspecified: Secondary | ICD-10-CM | POA: Insufficient documentation

## 2015-01-21 DIAGNOSIS — G43809 Other migraine, not intractable, without status migrainosus: Secondary | ICD-10-CM

## 2015-01-21 DIAGNOSIS — Z87891 Personal history of nicotine dependence: Secondary | ICD-10-CM | POA: Insufficient documentation

## 2015-01-21 DIAGNOSIS — I10 Essential (primary) hypertension: Secondary | ICD-10-CM | POA: Diagnosis not present

## 2015-01-21 DIAGNOSIS — Z79899 Other long term (current) drug therapy: Secondary | ICD-10-CM | POA: Diagnosis not present

## 2015-01-21 MED ORDER — KETOROLAC TROMETHAMINE 30 MG/ML IJ SOLN
30.0000 mg | Freq: Once | INTRAMUSCULAR | Status: AC
  Administered 2015-01-21: 30 mg via INTRAVENOUS
  Filled 2015-01-21: qty 1

## 2015-01-21 MED ORDER — DIPHENHYDRAMINE HCL 50 MG/ML IJ SOLN
25.0000 mg | Freq: Once | INTRAMUSCULAR | Status: AC
  Administered 2015-01-21: 25 mg via INTRAVENOUS
  Filled 2015-01-21: qty 1

## 2015-01-21 MED ORDER — SODIUM CHLORIDE 0.9 % IV BOLUS (SEPSIS)
1000.0000 mL | Freq: Once | INTRAVENOUS | Status: AC
  Administered 2015-01-21: 1000 mL via INTRAVENOUS

## 2015-01-21 MED ORDER — PROCHLORPERAZINE EDISYLATE 5 MG/ML IJ SOLN
10.0000 mg | Freq: Once | INTRAMUSCULAR | Status: AC
  Administered 2015-01-21: 10 mg via INTRAVENOUS
  Filled 2015-01-21: qty 2

## 2015-01-21 NOTE — ED Provider Notes (Signed)
CSN: [id number]     Arrival date & time 01/21/15  7106 History   First MD Initiated Contact with Patient 01/21/15 [phone]     Chief Complaint  Patient presents with  . Headache   [name] is a 29 y.o. female with a history of migraines who presents the emergency room complaining of a migraine headache that has been ongoing for the past several weeks, and worsened in the last 5 days. She reports this migraine is typical for her, except worsened pain. She reports her usual medicines like Lyrica or not helping for her pain. She complains of a 9 out of 10 frontal headache that she describes as dull. Patient reports taking Lyrica, naproxen and BC powders yesterday without relief. She also complains of tinnitus and black spots in her vision which are typical for her migraines. She reports she feels worse sitting up. She also complains of some nausea and has vomited 3 times in last 24 hours. This is also typical for her migraines. The patient is followed by [facility] and is due to have an MRI tomorrow. The patient denies fevers, chills, numbness, tingling, weakness, abdominal pain, dizziness.   (Consider location/radiation/quality/duration/timing/severity/associated sxs/prior Treatment) HPI  Past Medical History  Diagnosis Date  . Seizures   . Depression   . Panic attack   . Hypertension   . Heart murmur   . Arrhythmia   . PTSD (post-traumatic stress disorder)   . 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 Back pain   . Lumbar radiculopathy    History reviewed. No pertinent past surgical history. Family History  Problem Relation Age of Onset  . Heart attack Mother   . Heart disease Mother   . Sudden death Mother   . Sudden death Sister   . Heart attack Sister   . Sudden death Maternal Grandmother   . Fainting Maternal Grandmother    History  Substance Use Topics  . Smoking status: Former Smoker    Quit date: 06/23/2012  . Smokeless tobacco: Never  Used  . Alcohol Use: Yes     Comment: ocassionally   OB History    No data available     Review of Systems  Constitutional: Negative for fever and chills.  HENT: Negative for congestion, ear pain and sore throat.   Eyes: Positive for visual disturbance. Negative for pain and discharge.  Respiratory: Negative for cough, shortness of breath and wheezing.   Cardiovascular: Negative for chest pain and palpitations.  Gastrointestinal: Positive for nausea and vomiting. Negative for abdominal pain and diarrhea.  Genitourinary: Negative for dysuria.  Musculoskeletal: Negative for back pain, neck pain and neck stiffness.  Skin: Negative for rash.  Neurological: Positive for light-headedness and headaches. Negative for syncope, speech difficulty, weakness and numbness.      Allergies  Aspirin  Home Medications   Prior to Admission medications   Medication Sig Start Date End Date Taking? Authorizing Provider  albuterol (PROVENTIL HFA;VENTOLIN HFA) 108 (90 BASE) MCG/ACT inhaler Inhale 2 puffs into the lungs every 4 (four) hours as needed for wheezing or shortness of breath. 06/11/14  Yes Carlisle Cater, PA-C  gabapentin (NEURONTIN) 300 MG capsule Take 300 mg by mouth 3 (three) times daily.    Yes Historical Provider, MD  loxapine (LOXITANE) 25 MG capsule Take 25 mg by mouth at bedtime as needed (sleep).  12/22/14  Yes Historical Provider, MD  naproxen (NAPROSYN) 500 MG tablet Take 1 tablet (500 mg total) by  mouth 2 (two) times daily as needed for mild pain, moderate pain or headache (TAKE WITH MEALS.). 11/12/14  Yes Mercedes Strupp Camprubi-Soms, PA-C  omeprazole (PRILOSEC) 40 MG capsule Take 1 capsule (40 mg total) by mouth daily. 03/29/14  Yes Willia Craze, NP  ondansetron (ZOFRAN ODT) 4 MG disintegrating tablet Take 1 tablet (4 mg total) by mouth every 8 (eight) hours as needed for nausea or vomiting. 11/12/14  Yes Mercedes Strupp Camprubi-Soms, PA-C  pregabalin (LYRICA) 75 MG capsule Take  1 capsule (75 mg total) by mouth 3 (three) times daily. 12/22/14  Yes Melvenia Beam, MD  propranolol ER (INDERAL LA) 80 MG 24 hr capsule Take 1 capsule (80 mg total) by mouth daily. 08/16/14  Yes Deboraha Sprang, MD  sertraline (ZOLOFT) 100 MG tablet Take 100 mg by mouth daily.   Yes Historical Provider, MD  Vitamin D, Ergocalciferol, (DRISDOL) 50000 UNITS CAPS capsule Take 1 capsule (50,000 Units total) by mouth every 7 (seven) days. 10/31/14  Yes Lance Bosch, NP  HYDROcodone-acetaminophen (NORCO) 5-325 MG per tablet Take 1 tablet by mouth every 6 (six) hours as needed for severe pain. Patient not taking: Reported on 01/03/2015 11/12/14   Patty Sermons Camprubi-Soms, PA-C  predniSONE (DELTASONE) 20 MG tablet Take 1 tablet (20 mg total) by mouth daily with breakfast. Patient not taking: Reported on 01/03/2015 06/15/14   Lance Bosch, NP  promethazine (PHENERGAN) 12.5 MG tablet Take 1 tablet (12.5 mg total) by mouth every 6 (six) hours as needed for nausea or vomiting. Patient not taking: Reported on 01/21/2015 03/29/14   Willia Craze, NP  traMADol (ULTRAM) 50 MG tablet Take 1 tablet (50 mg total) by mouth every 12 (twelve) hours as needed. Patient not taking: Reported on 01/03/2015 08/03/14   Lance Bosch, NP   BP 107/62 mmHg  Pulse 74  Temp(Src) 97.9 F (36.6 C) (Oral)  Resp 15  Ht 5\' 10"  (1.778 m)  Wt 132 lb (59.875 kg)  BMI 18.94 kg/m2  SpO2 100%  LMP 01/10/2015 (Exact Date) Physical Exam  Constitutional: She is oriented to person, place, and time. She appears well-developed and well-nourished. No distress.  HENT:  Head: Normocephalic and atraumatic.  Right Ear: External ear normal.  Nose: Nose normal.  Mouth/Throat: Oropharynx is clear and moist. No oropharyngeal exudate.  Bilateral tympanic membranes are pearly-gray without erythema or loss of landmarks. No temporal edema.   Eyes: Conjunctivae and EOM are normal. Pupils are equal, round, and reactive to light. Right eye  exhibits no discharge. Left eye exhibits no discharge.  Neck: Normal range of motion. Neck supple. No JVD present.  Full ROM of neck. No neck tenderness.   Cardiovascular: Normal rate, regular rhythm, normal heart sounds and intact distal pulses.  Exam reveals no gallop and no friction rub.   No murmur heard. Bilateral radial pulses intact.   Pulmonary/Chest: Effort normal and breath sounds normal. No respiratory distress. She has no wheezes. She has no rales.  Abdominal: Soft. Bowel sounds are normal. There is no tenderness.  Musculoskeletal: Normal range of motion. She exhibits no edema.  Patient is spontaneously moving all extremities in a coordinated fashion exhibiting good strength.  Lymphadenopathy:    She has no cervical adenopathy.  Neurological: She is alert and oriented to person, place, and time. No cranial nerve deficit. Coordination normal.  Cranial nerves intact. Sensation intact in bilateral upper and lower extremities.   Skin: Skin is warm and dry. No rash noted. She is  not diaphoretic. No erythema. No pallor.  Psychiatric: She has a normal mood and affect. Her behavior is normal.  Nursing note and vitals reviewed.   ED Course  Procedures (including critical care time) Labs Review Labs Reviewed - No data to display  Imaging Review No results found.   EKG Interpretation None      Filed Vitals:   01/21/15 0448 01/21/15 0650 01/21/15 0651 01/21/15 0758  BP: 120/70  120/74 107/62  Pulse: 95 73 71 74  Temp: 97.9 F (36.6 C)     TempSrc: Oral     Resp: 16  20 15   Height: 5\' 10"  (1.778 m)     Weight: 132 lb (59.875 kg)     SpO2: 99% 100% 100% 100%     MDM   Meds given in ED:  Medications  sodium chloride 0.9 % bolus 1,000 mL (1,000 mLs Intravenous New Bag/Given 01/21/15 0645)  ketorolac (TORADOL) 30 MG/ML injection 30 mg (30 mg Intravenous Given 01/21/15 0645)  prochlorperazine (COMPAZINE) injection 10 mg (10 mg Intravenous Given 01/21/15 0645)   diphenhydrAMINE (BENADRYL) injection 25 mg (25 mg Intravenous Given 01/21/15 0645)    New Prescriptions   No medications on file    Final diagnoses:  Other migraine without status migrainosus, not intractable   This is a 29 y.o. female with a history of migraines who presents the emergency room complaining of a migraine headache that has been ongoing for the past several weeks, and worsened in the last 5 days.  She reports a usual migraine just worsening pain that her usual medications are not helping. Patient is followed by [facility] and is due to have an MRI tomorrow. Patient is afebrile and nontoxic-appearing. The patient is neurologically intact. She initially complains of 9 out of 10 pain. Plan is to do migraine cocktail with fluid bolus, Toradol, Compazine and Benadryl. , Initial reevaluation the patient is complaining of 6 out of 10 pain and requests to wait a while longer prior to discharge. MI secondary evaluation the patient reports her pain is down to 4 out of 10 and is feeling much better. She feels ready to be discharged. Presentation is like pts typical HA and non concerning for [facility], ICH, Meningitis, or temporal arteritis. Pt is afebrile with no focal neuro deficits, or nuchal rigidity. She has not vomited since arrival. I advised her to keep her follow up appointment with her neurologist tomorrow. I advised the patient to follow-up with their primary care provider this week. I advised the patient to return to the emergency department with new or worsening symptoms or new concerns. The patient verbalized understanding and agreement with plan.        Hanley Hays, PA-C 01/21/15 5465  Everlene Balls, MD 01/21/15 [phone]

## 2015-01-21 NOTE — ED Notes (Signed)
Upon arrival to treatment patient states "I guess I should just go ahead and get that looked at while I'm here" to her husband. Patient then turns to nurse and reports she has been having "lumps" to her right abd and flank area and that she is unsure if perhaps they are related to her headache. Patient was advised this would be noted in her chart and to be sure she mentions this to the physician.

## 2015-01-21 NOTE — ED Notes (Signed)
Patient states she has been having headaches for several weeks, states this headache has been ongoing throughout the weekend. Patient is seen at [facility] and is scheduled for MRI brain on Monday. Patient states she takes Lyrica for headaches, but it is not helping.

## 2015-01-21 NOTE — Discharge Instructions (Signed)

## 2015-01-22 ENCOUNTER — Encounter (INDEPENDENT_AMBULATORY_CARE_PROVIDER_SITE_OTHER): Payer: Medicaid Other | Admitting: Neurology

## 2015-01-22 ENCOUNTER — Ambulatory Visit
Admission: RE | Admit: 2015-01-22 | Discharge: 2015-01-22 | Disposition: A | Discharge status: Home / Self Care | Payer: Medicaid Other | Source: Ambulatory Visit | Attending: Neurology | Admitting: Neurology

## 2015-01-22 DIAGNOSIS — H53132 Sudden visual loss, left eye: Secondary | ICD-10-CM

## 2015-01-22 DIAGNOSIS — H93232 Hyperacusis, left ear: Secondary | ICD-10-CM

## 2015-01-22 DIAGNOSIS — R51 Headache: Secondary | ICD-10-CM

## 2015-01-22 DIAGNOSIS — R519 Headache, unspecified: Secondary | ICD-10-CM

## 2015-01-22 MED ORDER — GADOBENATE DIMEGLUMINE 529 MG/ML IV SOLN
12.0000 mL | Freq: Once | INTRAVENOUS | Status: AC | PRN
  Administered 2015-01-22: 12 mL via INTRAVENOUS

## 2015-01-23 ENCOUNTER — Encounter (HOSPITAL_COMMUNITY): Payer: Self-pay | Admitting: Emergency Medicine

## 2015-01-23 ENCOUNTER — Telehealth: Payer: Self-pay | Admitting: *Deleted

## 2015-01-23 ENCOUNTER — Emergency Department (HOSPITAL_COMMUNITY)
Admission: EM | Admit: 2015-01-23 | Discharge: 2015-01-23 | Disposition: A | Discharge status: Home / Self Care | Payer: Medicaid Other | Attending: Emergency Medicine | Admitting: Emergency Medicine

## 2015-01-23 DIAGNOSIS — Z862 Personal history of diseases of the blood and blood-forming organs and certain disorders involving the immune mechanism: Secondary | ICD-10-CM | POA: Insufficient documentation

## 2015-01-23 DIAGNOSIS — F4310 Post-traumatic stress disorder, unspecified: Secondary | ICD-10-CM | POA: Insufficient documentation

## 2015-01-23 DIAGNOSIS — R011 Cardiac murmur, unspecified: Secondary | ICD-10-CM | POA: Insufficient documentation

## 2015-01-23 DIAGNOSIS — Z3202 Encounter for pregnancy test, result negative: Secondary | ICD-10-CM | POA: Insufficient documentation

## 2015-01-23 DIAGNOSIS — I10 Essential (primary) hypertension: Secondary | ICD-10-CM | POA: Insufficient documentation

## 2015-01-23 DIAGNOSIS — G8929 Other chronic pain: Secondary | ICD-10-CM

## 2015-01-23 DIAGNOSIS — R300 Dysuria: Secondary | ICD-10-CM | POA: Insufficient documentation

## 2015-01-23 DIAGNOSIS — Z87891 Personal history of nicotine dependence: Secondary | ICD-10-CM | POA: Insufficient documentation

## 2015-01-23 DIAGNOSIS — F329 Major depressive disorder, single episode, unspecified: Secondary | ICD-10-CM | POA: Insufficient documentation

## 2015-01-23 DIAGNOSIS — R208 Other disturbances of skin sensation: Secondary | ICD-10-CM

## 2015-01-23 DIAGNOSIS — M545 Low back pain: Secondary | ICD-10-CM | POA: Insufficient documentation

## 2015-01-23 DIAGNOSIS — L988 Other specified disorders of the skin and subcutaneous tissue: Secondary | ICD-10-CM | POA: Insufficient documentation

## 2015-01-23 DIAGNOSIS — Z7951 Long term (current) use of inhaled steroids: Secondary | ICD-10-CM | POA: Insufficient documentation

## 2015-01-23 DIAGNOSIS — G40909 Epilepsy, unspecified, not intractable, without status epilepticus: Secondary | ICD-10-CM | POA: Insufficient documentation

## 2015-01-23 DIAGNOSIS — M549 Dorsalgia, unspecified: Secondary | ICD-10-CM

## 2015-01-23 DIAGNOSIS — F410 Panic disorder [episodic paroxysmal anxiety] without agoraphobia: Secondary | ICD-10-CM | POA: Insufficient documentation

## 2015-01-23 DIAGNOSIS — Z79899 Other long term (current) drug therapy: Secondary | ICD-10-CM | POA: Insufficient documentation

## 2015-01-23 LAB — URINALYSIS, ROUTINE W REFLEX MICROSCOPIC
Bilirubin Urine: NEGATIVE
Glucose, UA: NEGATIVE mg/dL
Hgb urine dipstick: NEGATIVE
Ketones, ur: NEGATIVE mg/dL
Leukocytes, UA: NEGATIVE
Nitrite: NEGATIVE
Protein, ur: NEGATIVE mg/dL
Specific Gravity, Urine: 1.015 (ref 1.005–1.030)
Urobilinogen, UA: 0.2 mg/dL (ref 0.0–1.0)
pH: 7.5 (ref 5.0–8.0)

## 2015-01-23 LAB — POC URINE PREG, ED: Preg Test, Ur: NEGATIVE

## 2015-01-23 MED ORDER — KETOROLAC TROMETHAMINE 60 MG/2ML IM SOLN
60.0000 mg | Freq: Once | INTRAMUSCULAR | Status: AC
  Administered 2015-01-23: 60 mg via INTRAMUSCULAR
  Filled 2015-01-23: qty 2

## 2015-01-23 MED ORDER — HYDROMORPHONE HCL 1 MG/ML IJ SOLN
1.0000 mg | Freq: Once | INTRAMUSCULAR | Status: AC
  Administered 2015-01-23: 1 mg via INTRAVENOUS
  Filled 2015-01-23: qty 1

## 2015-01-23 NOTE — ED Provider Notes (Signed)
CSN: [id number]     Arrival date & time 01/23/15  1234 History  This chart was scribed for non-physician practitioner, Margarita Mail, PA-C, working with Veryl Speak, MD by Ladene Artist, ED Scribe. This patient was seen in room TR05C/TR05C and the patient's care was started at 2:52 PM.   Chief Complaint  Patient presents with  . Back Pain  . Generalized Body Aches   The history is provided by the patient. No language interpreter was used.   HPI Comments: [name] is a 29 y.o. female, with a h/o seizures, POTS, HTN, arrhythmia, depression, who presents to the Emergency Department complaining of persistent, gradually worsening lower back pain for the past year. Pt reports associated generalized body aches that she describes as a burning sensation for the past day. Pt reports "I feel like my skin is on fire". Pain is exacerbated with touching. She reports associated dysuria, chronic nausea and vomiting, chronic migraines 2-3 daily. Pt denies fever. She has been treating with Naproxen and Lyrica which is prescribed by her neurologist.   PCP: Chari Manning, NP Neurologist: [facility] Cardiologist: Virl Axe, MD  Past Medical History  Diagnosis Date  . Seizures   . Depression   . Panic attack   . Hypertension   . Heart murmur   . Arrhythmia   . PTSD (post-traumatic stress disorder)   . 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 Back pain   . Lumbar radiculopathy    History reviewed. No pertinent past surgical history. Family History  Problem Relation Age of Onset  . Heart attack Mother   . Heart disease Mother   . Sudden death Mother   . Sudden death Sister   . Heart attack Sister   . Sudden death Maternal Grandmother   . Fainting Maternal Grandmother    History  Substance Use Topics  . Smoking status: Former Smoker    Quit date: 06/23/2012  . Smokeless tobacco: Never Used  . Alcohol Use: No   OB History    No data available     Review of  Systems  Constitutional: Negative for fever.  Genitourinary: Positive for dysuria.  Musculoskeletal: Positive for myalgias and back pain.   Allergies  Aspirin  Home Medications   Prior to Admission medications   Medication Sig Start Date End Date Taking? Authorizing Provider  albuterol (PROVENTIL HFA;VENTOLIN HFA) 108 (90 BASE) MCG/ACT inhaler Inhale 2 puffs into the lungs every 4 (four) hours as needed for wheezing or shortness of breath. 06/11/14   Carlisle Cater, PA-C  DULoxetine (CYMBALTA) 30 MG capsule Take 1 capsule (30 mg total) by mouth daily. 01/24/15   Josalyn C Funches, MD  fluticasone (FLONASE) 50 MCG/ACT nasal spray Place 2 sprays into both nostrils daily. 01/24/15   Josalyn C Funches, MD  gabapentin (NEURONTIN) 300 MG capsule Take 300 mg by mouth 3 (three) times daily.     Historical Provider, MD  loratadine (CLARITIN) 10 MG tablet Take 1 tablet (10 mg total) by mouth daily. 01/24/15   Josalyn C Funches, MD  loxapine (LOXITANE) 25 MG capsule Take 25 mg by mouth at bedtime as needed (sleep).  12/22/14   Historical Provider, MD  naproxen (NAPROSYN) 500 MG tablet Take 1 tablet (500 mg total) by mouth 2 (two) times daily as needed for mild pain, moderate pain or headache (TAKE WITH MEALS.). 11/12/14   Mercedes Strupp Camprubi-Soms, PA-C  omeprazole (PRILOSEC) 40 MG capsule Take 1 capsule (40 mg total) by  mouth daily. 03/29/14   Willia Craze, NP  ondansetron (ZOFRAN ODT) 4 MG disintegrating tablet Take 1 tablet (4 mg total) by mouth every 8 (eight) hours as needed for nausea or vomiting. 11/12/14   Patty Sermons Camprubi-Soms, PA-C  pregabalin (LYRICA) 75 MG capsule Take 1 capsule (75 mg total) by mouth 3 (three) times daily. 12/22/14   Melvenia Beam, MD  propranolol ER (INDERAL LA) 80 MG 24 hr capsule Take 1 capsule (80 mg total) by mouth daily. 08/16/14   Deboraha Sprang, MD  Vitamin D, Ergocalciferol, (DRISDOL) 50000 UNITS CAPS capsule Take 1 capsule (50,000 Units total) by mouth every 7  (seven) days. 10/31/14   Lance Bosch, NP   BP 190/90 mmHg  Pulse 98  Temp(Src) 98.3 F (36.8 C) (Oral)  Resp 20  Ht 5\' 10"  (1.778 m)  Wt 132 lb (59.875 kg)  BMI 18.94 kg/m2  SpO2 100%  LMP 01/10/2015 (Exact Date) Physical Exam  Constitutional: She is oriented to person, place, and time. She appears well-developed and well-nourished. No distress.  HENT:  Head: Normocephalic and atraumatic.  Eyes: Conjunctivae and EOM are normal.  Neck: Neck supple. No tracheal deviation present.  Cardiovascular: Normal rate.   Pulmonary/Chest: Effort normal. No respiratory distress.  Musculoskeletal: Normal range of motion.  Neurological: She is alert and oriented to person, place, and time.  Skin: Skin is warm and dry.  Psychiatric: She has a normal mood and affect. Her behavior is normal.  Nursing note and vitals reviewed.  ED Course  Procedures (including critical care time) DIAGNOSTIC STUDIES: Oxygen Saturation is 100% on RA, normal by my interpretation.    COORDINATION OF CARE: 2:57 PM-Discussed treatment plan which includes UA, blood gas, Dilaudid injection and Toradol injection with pt at bedside and pt agreed to plan.   Labs Review Labs Reviewed  URINALYSIS, ROUTINE W REFLEX MICROSCOPIC  POC URINE PREG, ED   Imaging Review No results found.  EKG Interpretation None      MDM   Final diagnoses:  Chronic back pain  Skin pain    Patient with chronic pain. Negative UA. I have explained the chronic pain policy here in the ED. Patient will receive acute pain treatment here in the ED.  No narcotics at discharge Patient with back pain.  No neurological deficits and normal neuro exam.  Patient can walk but states is painful.  No loss of bowel or bladder control.  No concern for cauda equina.  No fever, night sweats, weight loss, h/o cancer, IVDU.  RICE protocol and pain medicine indicated and discussed with patient.    I personally performed the services described in this  documentation, which was scribed in my presence. The recorded information has been reviewed and is accurate.      Margarita Mail, PA-C 01/25/15 [place], MD 01/26/15 [phone]

## 2015-01-23 NOTE — ED Notes (Signed)
Patient ambulated to restroom and tolerated well.  

## 2015-01-23 NOTE — ED Notes (Signed)
Onset lower back one year ago and having increased pain currently 10/10 achy sharp with general body achy soreness "skin burning."

## 2015-01-23 NOTE — ED Notes (Signed)
Patient was outside.  She is back in triage waiting

## 2015-01-23 NOTE — ED Notes (Signed)
Attempted to call in sub waiting, no answer

## 2015-01-23 NOTE — ED Notes (Signed)
Patient did not answer in triage nor in the large waiting room

## 2015-01-23 NOTE — Telephone Encounter (Signed)
Talked with patient about normal lab results. Patient verbalized understanding. Pt in a lot of pain and wanted to be seen. I told her I would call her back after I spoke with Dr. Jaynee Eagles and to go to urgent care.

## 2015-01-23 NOTE — Discharge Instructions (Signed)
Chronic Pain Discharge Instructions  °Emergency care providers appreciate that many patients coming to us are in severe pain and we wish to address their pain in the safest, most responsible manner.  It is important to recognize however, that the proper treatment of chronic pain differs from that of the pain of injuries and acute illnesses.  Our goal is to provide quality, safe, personalized care and we thank you for giving us the opportunity to serve you. °The use of narcotics and related agents for chronic pain syndromes may lead to additional physical and psychological problems.  Nearly as many people die from prescription narcotics each year as die from car crashes.  Additionally, this risk is increased if such prescriptions are obtained from a variety of sources.  Therefore, only your primary care physician or a pain management specialist is able to safely treat such syndromes with narcotic medications long-term.   ° °Documentation revealing such prescriptions have been sought from multiple sources may prohibit us from providing a refill or different narcotic medication.  Your name may be checked first through the [place] Controlled Substances Reporting System.  This database is a record of controlled substance medication prescriptions that the patient has received.  This has been established by [facility] in an effort to eliminate the dangerous, and often life threatening, practice of obtaining multiple prescriptions from different medical providers.  ° °If you have a chronic pain syndrome (i.e. chronic headaches, recurrent back or neck pain, dental pain, abdominal or pelvis pain without a specific diagnosis, or neuropathic pain such as fibromyalgia) or recurrent visits for the same condition without an acute diagnosis, you may be treated with non-narcotics and other non-addictive medicines.  Allergic reactions or negative side effects that may be reported by a patient to such medications will not  typically lead to the use of a narcotic analgesic or other controlled substance as an alternative. °  °Patients managing chronic pain with a personal physician should have provisions in place for breakthrough pain.  If you are in crisis, you should call your physician.  If your physician directs you to the emergency department, please have the doctor call and speak to our attending physician concerning your care. °  °When patients come to the Emergency Department (ED) with acute medical conditions in which the Emergency Department physician feels appropriate to prescribe narcotic or sedating pain medication, the physician will prescribe these in very limited quantities.  The amount of these medications will last only until you can see your primary care physician in his/her office.  Any patient who returns to the ED seeking refills should expect only non-narcotic pain medications.  ° °In the event of an acute medical condition exists and the emergency physician feels it is necessary that the patient be given a narcotic or sedating medication -  a responsible adult driver should be present in the room prior to the medication being given by the nurse. °  °Prescriptions for narcotic or sedating medications that have been lost, stolen or expired will not be refilled in the Emergency Department.   ° °Patients who have chronic pain may receive non-narcotic prescriptions until seen by their primary care physician.  It is every patient’s personal responsibility to maintain active prescriptions with his or her primary care physician or specialist. °

## 2015-01-23 NOTE — ED Notes (Signed)
Attempted to call in main waiting room, no answer.

## 2015-01-23 NOTE — ED Notes (Signed)
Called respiratory to advise we need arterial blood gas.   Merry Proud, RT, advised will come and draw.
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
## 2015-01-24 ENCOUNTER — Encounter: Payer: Self-pay | Admitting: Family Medicine

## 2015-01-24 ENCOUNTER — Ambulatory Visit: Payer: Medicaid Other | Attending: Family Medicine | Admitting: Family Medicine

## 2015-01-24 VITALS — BP 143/87 | HR 99 | Temp 98.1°F | Resp 15 | Wt 135.4 lb

## 2015-01-24 DIAGNOSIS — H53149 Visual discomfort, unspecified: Secondary | ICD-10-CM | POA: Diagnosis not present

## 2015-01-24 DIAGNOSIS — G894 Chronic pain syndrome: Secondary | ICD-10-CM

## 2015-01-24 DIAGNOSIS — J309 Allergic rhinitis, unspecified: Secondary | ICD-10-CM | POA: Insufficient documentation

## 2015-01-24 DIAGNOSIS — R112 Nausea with vomiting, unspecified: Secondary | ICD-10-CM | POA: Insufficient documentation

## 2015-01-24 DIAGNOSIS — R4182 Altered mental status, unspecified: Secondary | ICD-10-CM | POA: Insufficient documentation

## 2015-01-24 DIAGNOSIS — M797 Fibromyalgia: Secondary | ICD-10-CM | POA: Insufficient documentation

## 2015-01-24 DIAGNOSIS — G44229 Chronic tension-type headache, not intractable: Secondary | ICD-10-CM

## 2015-01-24 DIAGNOSIS — E559 Vitamin D deficiency, unspecified: Secondary | ICD-10-CM | POA: Insufficient documentation

## 2015-01-24 DIAGNOSIS — R51 Headache: Secondary | ICD-10-CM | POA: Diagnosis present

## 2015-01-24 DIAGNOSIS — Z0000 Encounter for general adult medical examination without abnormal findings: Secondary | ICD-10-CM

## 2015-01-24 HISTORY — DX: Allergic rhinitis, unspecified: J30.9

## 2015-01-24 HISTORY — DX: Vitamin D deficiency, unspecified: E55.9

## 2015-01-24 MED ORDER — LORATADINE 10 MG PO TABS: 10.0000 mg | ORAL_TABLET | Freq: Every day | ORAL | Status: DC

## 2015-01-24 MED ORDER — DULOXETINE HCL 30 MG PO CPEP: 30.0000 mg | ORAL_CAPSULE | Freq: Every day | ORAL | Status: DC

## 2015-01-24 MED ORDER — FLUTICASONE PROPIONATE 50 MCG/ACT NA SUSP: 2.0000 | Freq: Every day | NASAL | Status: DC
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
## 2015-01-24 NOTE — Telephone Encounter (Signed)
Called patient, no one answered. Left a message to call us back if she still was not feeling well. Will be happy to see her in the office. Asked her to schedule an appointment. Would be best if she contacted my nurse emma because I probably need 30 minute follow up. Asked her to call us back. Thank you.

## 2015-01-24 NOTE — Progress Notes (Signed)
Patient complains her skin hurts all over-feels like it is on "fire" Patient also states she has been having more frequent migraine headaches Complains of having a lump to the right side of her abd Complains of generalized pain everywhere

## 2015-01-24 NOTE — Patient Instructions (Addendum)
[name],  Thank you for coming in today. It was a pleasure meeting you. I look forward to being your primary doctor.   1. Chronic pain symptoms: Stop zoloft 100 mg daily  Start cymbalta 30 mg daily    2. Health care maintenance: due for screening pap.   3. Headaches: cymbalta will help with headaches  In addition we will treat what looks like allergic rhinitis with flonase and claritin   4. Vit D deficiency:  took vit D x 12 weeks. Rechecking level today.   F/u in 4-6 weeks for pap smear  Dr. Adrian Blackwater

## 2015-01-25 ENCOUNTER — Encounter: Payer: Self-pay | Admitting: Family Medicine

## 2015-01-25 DIAGNOSIS — Z0000 Encounter for general adult medical examination without abnormal findings: Secondary | ICD-10-CM | POA: Insufficient documentation

## 2015-01-25 DIAGNOSIS — G44229 Chronic tension-type headache, not intractable: Secondary | ICD-10-CM | POA: Insufficient documentation

## 2015-01-25 LAB — VITAMIN D 25 HYDROXY (VIT D DEFICIENCY, FRACTURES): Vit D, 25-Hydroxy: 34 ng/mL (ref 30–100)

## 2015-01-25 NOTE — Assessment & Plan Note (Addendum)
Vit D deficiency:  took vit D x 12 weeks. Rechecking level today.  Repeat level normal Will resolve problem

## 2015-01-25 NOTE — Assessment & Plan Note (Signed)
Health care maintenance: due for screening pap.

## 2015-01-25 NOTE — Assessment & Plan Note (Signed)
1. Chronic pain symptoms: Stop zoloft 100 mg daily  Start cymbalta 30 mg daily

## 2015-01-25 NOTE — Assessment & Plan Note (Signed)
A; chronic tension-type headaches.  P: cymbalta will help with headaches  In addition we will treat what looks like allergic rhinitis with flonase and claritin

## 2015-01-25 NOTE — Assessment & Plan Note (Signed)
treat allergic rhinitis with flonase and claritin

## 2015-01-30 ENCOUNTER — Encounter (HOSPITAL_COMMUNITY): Payer: Self-pay | Admitting: Emergency Medicine

## 2015-01-30 ENCOUNTER — Emergency Department (INDEPENDENT_AMBULATORY_CARE_PROVIDER_SITE_OTHER)
Admission: EM | Admit: 2015-01-30 | Discharge: 2015-01-30 | Disposition: A | Discharge status: Home / Self Care | Payer: Medicaid Other | Source: Home / Self Care | Attending: Family Medicine | Admitting: Family Medicine

## 2015-01-30 ENCOUNTER — Telehealth: Payer: Self-pay | Admitting: *Deleted

## 2015-01-30 DIAGNOSIS — K5900 Constipation, unspecified: Secondary | ICD-10-CM

## 2015-01-30 LAB — POCT PREGNANCY, URINE: Preg Test, Ur: NEGATIVE

## 2015-01-30 LAB — POCT URINALYSIS DIP (DEVICE)
Bilirubin Urine: NEGATIVE
Glucose, UA: NEGATIVE mg/dL
Hgb urine dipstick: NEGATIVE
Ketones, ur: NEGATIVE mg/dL
Leukocytes, UA: NEGATIVE
Nitrite: NEGATIVE
Protein, ur: NEGATIVE mg/dL
Specific Gravity, Urine: 1.025 (ref 1.005–1.030)
Urobilinogen, UA: 0.2 mg/dL (ref 0.0–1.0)
pH: 6 (ref 5.0–8.0)

## 2015-01-30 MED ORDER — POLYETHYLENE GLYCOL 3350 17 G PO PACK: 17.0000 g | PACK | Freq: Every day | ORAL | Status: DC

## 2015-01-30 MED ORDER — DOCUSATE SODIUM 100 MG PO CAPS: 100.0000 mg | ORAL_CAPSULE | Freq: Two times a day (BID) | ORAL | Status: DC

## 2015-01-30 NOTE — ED Provider Notes (Signed)
CSN: [id number]     Arrival date & time 01/30/15  7867 History   First MD Initiated Contact with Patient 01/30/15 0932     Chief Complaint  Patient presents with  . Pain  . Constipation   (Consider location/radiation/quality/duration/timing/severity/associated sxs/prior Treatment) HPI Comments: Ms. Chea presents with PMHx  .  Seizures     .  Depression     .  Panic attack     .  Hypertension     .  Heart murmur     .  Arrhythmia     .  PTSD (post-traumatic stress disorder)     .  Back pain     .  Lumbar radiculopathy   States she was recently diagnosed with chronic pain syndrome and fibromyalgia by her neurologist. Recently began taking Cymbalta (began one week ago). Also complains of constipation with associated tenesmus. States she has been taking Goody Powders for pain.  LNMP: 01/10/2015  Patient is a 29 y.o. female presenting with constipation.  Constipation Associated symptoms: abdominal pain, back pain and vomiting   Associated symptoms: no diarrhea and no nausea     Past Medical History  Diagnosis Date  . Seizures   . Depression   . Panic attack   . Hypertension   . Heart murmur   . Arrhythmia   . PTSD (post-traumatic stress disorder)   . 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 Back pain   . Lumbar radiculopathy   . Vitamin D deficiency 01/24/2015   History reviewed. No pertinent past surgical history. Family History  Problem Relation Age of Onset  . Heart attack Mother   . Heart disease Mother   . Sudden death Mother   . Sudden death Sister   . Heart attack Sister   . Sudden death Maternal Grandmother   . Fainting Maternal Grandmother    History  Substance Use Topics  . Smoking status: Former Smoker    Quit date: 06/23/2012  . Smokeless tobacco: Never Used  . Alcohol Use: No   OB History    No data available     Review of Systems  Constitutional: Negative.   HENT: Negative.   Eyes: Negative.   Respiratory: Negative.    Cardiovascular: Negative.   Gastrointestinal: Positive for vomiting, abdominal pain and constipation. Negative for nausea and diarrhea.       Single episode of vomiting over the past 24 hours.   Endocrine: Negative for polydipsia, polyphagia and polyuria.  Genitourinary: Negative.   Musculoskeletal: Positive for myalgias, back pain and arthralgias. Negative for neck pain and neck stiffness.  Skin: Negative.   Neurological: Negative for dizziness, syncope, weakness, light-headedness and headaches.    Allergies  Aspirin  Home Medications   Prior to Admission medications   Medication Sig Start Date End Date Taking? Authorizing Provider  DULoxetine (CYMBALTA) 30 MG capsule Take 1 capsule (30 mg total) by mouth daily. 01/24/15  Yes Josalyn Funches, MD  gabapentin (NEURONTIN) 300 MG capsule Take 300 mg by mouth 3 (three) times daily.    Yes Historical Provider, MD  loxapine (LOXITANE) 25 MG capsule Take 25 mg by mouth at bedtime as needed (sleep).  12/22/14  Yes Historical Provider, MD  omeprazole (PRILOSEC) 40 MG capsule Take 1 capsule (40 mg total) by mouth daily. 03/29/14  Yes Willia Craze, NP  ondansetron (ZOFRAN ODT) 4 MG disintegrating tablet Take 1 tablet (4 mg total) by mouth every 8 (eight) hours as needed for nausea  or vomiting. 11/12/14  Yes Mercedes Camprubi-Soms, PA-C  pregabalin (LYRICA) 75 MG capsule Take 1 capsule (75 mg total) by mouth 3 (three) times daily. 12/22/14  Yes Melvenia Beam, MD  propranolol ER (INDERAL LA) 80 MG 24 hr capsule Take 1 capsule (80 mg total) by mouth daily. 08/16/14  Yes Deboraha Sprang, MD  Vitamin D, Ergocalciferol, (DRISDOL) 50000 UNITS CAPS capsule Take 1 capsule (50,000 Units total) by mouth every 7 (seven) days. 10/31/14  Yes Lance Bosch, NP  albuterol (PROVENTIL HFA;VENTOLIN HFA) 108 (90 BASE) MCG/ACT inhaler Inhale 2 puffs into the lungs every 4 (four) hours as needed for wheezing or shortness of breath. 06/11/14   Carlisle Cater, PA-C  docusate  sodium (COLACE) 100 MG capsule Take 1 capsule (100 mg total) by mouth every 12 (twelve) hours. 01/30/15   Audelia Hives Arvel Oquinn, PA  fluticasone (FLONASE) 50 MCG/ACT nasal spray Place 2 sprays into both nostrils daily. 01/24/15   Josalyn Funches, MD  loratadine (CLARITIN) 10 MG tablet Take 1 tablet (10 mg total) by mouth daily. 01/24/15   Josalyn Funches, MD  naproxen (NAPROSYN) 500 MG tablet Take 1 tablet (500 mg total) by mouth 2 (two) times daily as needed for mild pain, moderate pain or headache (TAKE WITH MEALS.). 11/12/14   Mercedes Camprubi-Soms, PA-C  polyethylene glycol (MIRALAX / GLYCOLAX) packet Take 17 g by mouth daily. Mix into 8 ounces of water or juice and drink once daily x 7 days 01/30/15   Annett Gula H Javaughn Opdahl, PA   BP 105/71 mmHg  Pulse 94  Temp(Src) 98.5 F (36.9 C) (Oral)  Resp 16  SpO2 99%  LMP 01/10/2015 (Exact Date) Physical Exam  Constitutional: She is oriented to person, place, and time. She appears well-developed and well-nourished. No distress.  HENT:  Head: Normocephalic and atraumatic.  Eyes: Conjunctivae are normal. No scleral icterus.  Cardiovascular: Normal rate, regular rhythm and normal heart sounds.   Pulmonary/Chest: Effort normal and breath sounds normal.  Abdominal: Soft. Normal appearance and bowel sounds are normal. She exhibits no distension and no mass. There is no tenderness. There is no rigidity, no rebound, no guarding and no CVA tenderness.  Musculoskeletal: Normal range of motion.  Neurological: She is alert and oriented to person, place, and time.  Skin: Skin is warm and dry.  Psychiatric: She has a normal mood and affect. Her behavior is normal.  Nursing note and vitals reviewed.   ED Course  Procedures (including critical care time) Labs Review Labs Reviewed - No data to display  Imaging Review No results found.   MDM   1. Constipation, unspecified constipation type     Exam benign Vitals normal UA unremarkable UPT  negative Advised that several of her medications used to control her chronic pain symptoms can cause constipation. Prescribed BID colace and 7 day course of Miralax and advised follow up with PCP or neurologist if symptoms persist.  ER re-eval if symptoms suddenly worse or severe.   Lutricia Feil, [place] 01/30/15 1046

## 2015-01-30 NOTE — Discharge Instructions (Signed)
Unfortunately many of the medications that you are taking to help control your chronic pain are known to cause constipation. Please use the medications prescribed to try to improve your symptoms and make bowel movements easier. Your urine studies were normal and your pregnancy test was negative. If symptoms become suddenly worse or severe, please seek re-evaluation at your nearest ER. You may use tylenol as directed on packaging for discomfort.   Constipation Constipation is when a person has fewer than three bowel movements a week, has difficulty having a bowel movement, or has stools that are dry, hard, or larger than normal. As people grow older, constipation is more common. If you try to fix constipation with medicines that make you have a bowel movement (laxatives), the problem may get worse. Long-term laxative use may cause the muscles of the colon to become weak. A low-fiber diet, not taking in enough fluids, and taking certain medicines may make constipation worse.  CAUSES   Certain medicines, such as antidepressants, pain medicine, iron supplements, antacids, and water pills.   Certain diseases, such as diabetes, irritable bowel syndrome (IBS), thyroid disease, or depression.   Not drinking enough water.   Not eating enough fiber-rich foods.   Stress or travel.   Lack of physical activity or exercise.   Ignoring the urge to have a bowel movement.   Using laxatives too much.  SIGNS AND SYMPTOMS   Having fewer than three bowel movements a week.   Straining to have a bowel movement.   Having stools that are hard, dry, or larger than normal.   Feeling full or bloated.   Pain in the lower abdomen.   Not feeling relief after having a bowel movement.  DIAGNOSIS  Your health care provider will take a medical history and perform a physical exam. Further testing may be done for severe constipation. Some tests may include:  A barium enema X-ray to examine your rectum,  colon, and, sometimes, your small intestine.   A sigmoidoscopy to examine your lower colon.   A colonoscopy to examine your entire colon. TREATMENT  Treatment will depend on the severity of your constipation and what is causing it. Some dietary treatments include drinking more fluids and eating more fiber-rich foods. Lifestyle treatments may include regular exercise. If these diet and lifestyle recommendations do not help, your health care provider may recommend taking over-the-counter laxative medicines to help you have bowel movements. Prescription medicines may be prescribed if over-the-counter medicines do not work.  HOME CARE INSTRUCTIONS   Eat foods that have a lot of fiber, such as fruits, vegetables, whole grains, and beans.  Limit foods high in fat and processed sugars, such as french fries, hamburgers, cookies, candies, and soda.   A fiber supplement may be added to your diet if you cannot get enough fiber from foods.   Drink enough fluids to keep your urine clear or pale yellow.   Exercise regularly or as directed by your health care provider.   Go to the restroom when you have the urge to go. Do not hold it.   Only take over-the-counter or prescription medicines as directed by your health care provider. Do not take other medicines for constipation without talking to your health care provider first.  [facility] IF:   You have bright red blood in your stool.   Your constipation lasts for more than 4 days or gets worse.   You have abdominal or rectal pain.   You have thin, pencil-like  stools.   You have unexplained weight loss. MAKE SURE YOU:   Understand these instructions.  Will watch your condition.  Will get help right away if you are not doing well or get worse. Document Released: 08/08/2004 Document Revised: 11/15/2013 Document Reviewed: 08/22/2013 [facility] Patient Information 2015 [place], [place]. This information is not intended  to replace advice given to you by your health care provider. Make sure you discuss any questions you have with your health care provider.

## 2015-01-30 NOTE — Telephone Encounter (Signed)
Called her twice today, left messages. Will try again tomorrow

## 2015-01-30 NOTE — Telephone Encounter (Signed)
Patient returning call stating she is still having a migraine. Per the last phone note patient is to come in and be seen in a 30 min time slot. Emma: I do not see any available openings today or tomorrow. Can you please call the patient and schedule. Patient is planning on going to urgent care because she is in so much pain.

## 2015-01-30 NOTE — Telephone Encounter (Signed)
Talked with patient and she was already at the ED for her migraines. Dr. Jaynee Eagles said she will follow up with her later tonight to check and see how she is feeling and maybe change her medications. Pt verbalized understanding.

## 2015-01-30 NOTE — ED Notes (Signed)
C/o  Chronic pain.  States "I hurt all over".  Pt also c/o severe constipation.  "It hurts to go the bathroom".   Mild nausea.  Denies fever, vomiting, and diarrhea.

## 2015-01-31 ENCOUNTER — Telehealth: Payer: Self-pay | Admitting: *Deleted

## 2015-01-31 NOTE — Telephone Encounter (Signed)
-----   Message from Minerva Ends, MD sent at 01/25/2015  8:44 AM EST ----- Vit D level improved from 8 to 34, now within normal limits

## 2015-01-31 NOTE — Telephone Encounter (Signed)
Pt aware of results 

## 2015-02-02 ENCOUNTER — Telehealth: Payer: Self-pay | Admitting: *Deleted

## 2015-02-02 NOTE — Telephone Encounter (Signed)
Called and left a message for the patient to call us back. Gave [facility] phone number and office hour.

## 2015-02-02 NOTE — Telephone Encounter (Signed)
[name], she can come in Monday at 9:30 for 30 minutes. I am happy to create a spot for her. Please call and see if this is acceptable for her. Thanks.

## 2015-03-01 ENCOUNTER — Telehealth: Payer: Self-pay | Admitting: Family Medicine

## 2015-03-01 NOTE — Telephone Encounter (Signed)
Pt requesting a refill of her albuterol inhaler. Pt states that she needs it asap as she is "struggling for air". Uses pharmacy [facility] on [address]. Please f/u.

## 2015-03-05 ENCOUNTER — Telehealth: Payer: Self-pay | Admitting: Family Medicine

## 2015-03-05 NOTE — Telephone Encounter (Signed)
Pt called requesting medication refill for  her albuterol inhaler. Pt states that she needs it asap as she is "struggling for air". Uses pharmacy [facility] on [address]. Please f/u

## 2015-03-13 ENCOUNTER — Ambulatory Visit: Payer: Self-pay | Attending: Family Medicine | Admitting: Family Medicine

## 2015-03-13 ENCOUNTER — Encounter: Payer: Self-pay | Admitting: Family Medicine

## 2015-03-13 VITALS — BP 105/68 | HR 81 | Temp 98.5°F | Resp 16 | Ht 70.0 in | Wt 138.0 lb

## 2015-03-13 DIAGNOSIS — K219 Gastro-esophageal reflux disease without esophagitis: Secondary | ICD-10-CM | POA: Insufficient documentation

## 2015-03-13 DIAGNOSIS — G894 Chronic pain syndrome: Secondary | ICD-10-CM | POA: Insufficient documentation

## 2015-03-13 DIAGNOSIS — F32A Depression, unspecified: Secondary | ICD-10-CM

## 2015-03-13 DIAGNOSIS — Z8659 Personal history of other mental and behavioral disorders: Secondary | ICD-10-CM | POA: Insufficient documentation

## 2015-03-13 DIAGNOSIS — R0602 Shortness of breath: Secondary | ICD-10-CM | POA: Insufficient documentation

## 2015-03-13 DIAGNOSIS — Z8639 Personal history of other endocrine, nutritional and metabolic disease: Secondary | ICD-10-CM | POA: Insufficient documentation

## 2015-03-13 DIAGNOSIS — E559 Vitamin D deficiency, unspecified: Secondary | ICD-10-CM | POA: Insufficient documentation

## 2015-03-13 DIAGNOSIS — F329 Major depressive disorder, single episode, unspecified: Secondary | ICD-10-CM | POA: Insufficient documentation

## 2015-03-13 DIAGNOSIS — Z124 Encounter for screening for malignant neoplasm of cervix: Secondary | ICD-10-CM | POA: Insufficient documentation

## 2015-03-13 DIAGNOSIS — J309 Allergic rhinitis, unspecified: Secondary | ICD-10-CM

## 2015-03-13 HISTORY — DX: Gastro-esophageal reflux disease without esophagitis: K21.9

## 2015-03-13 LAB — SEDIMENTATION RATE: Sed Rate: 1 mm/hr (ref 0–20)

## 2015-03-13 LAB — C-REACTIVE PROTEIN: CRP: <0.5 mg/dL (ref <0.60)

## 2015-03-13 LAB — RHEUMATOID FACTOR: Rhuematoid fact SerPl-aCnc: <10 IU/mL (ref <=14)

## 2015-03-13 MED ORDER — VITAMIN D3 50 MCG (2000 UT) PO TABS: 1.0000 | ORAL_TABLET | Freq: Every day | ORAL | Status: DC

## 2015-03-13 MED ORDER — FLUTICASONE PROPIONATE 50 MCG/ACT NA SUSP: 2.0000 | Freq: Every day | NASAL | Status: DC

## 2015-03-13 MED ORDER — OMEPRAZOLE 40 MG PO CPDR: 40.0000 mg | DELAYED_RELEASE_CAPSULE | Freq: Every day | ORAL | Status: DC

## 2015-03-13 MED ORDER — DULOXETINE HCL 20 MG PO CPEP: 20.0000 mg | ORAL_CAPSULE | Freq: Every day | ORAL | Status: DC

## 2015-03-13 MED ORDER — CETIRIZINE HCL 10 MG PO TABS: 10.0000 mg | ORAL_TABLET | Freq: Every day | ORAL | Status: DC

## 2015-03-13 NOTE — Patient Instructions (Addendum)
[name],  Thank you for coming in to see me today.  1. Shortness of breath: I suspect GERD and allergic rhinitis and some element of anxiety. My suspicion for asthma or reactive airway disease is low.  Plan: prilosec and GERD diet for  Zyrtec for allergies  Continue flonase.  Referral to pulmonology for pulmonary function testing. You are on propranolol which is a type of beta blocker that could potentially worsen airway disease if present.   2. Pap done today.  3. Chronic pain: normal ANA and sed rate in 04/2014.  additional rheumatological factor taking today.   4. Depression symptoms: Counseling services available at [place], [place] and [place].  Decrease Cymbalta from 20 mg to 30 mg once daily   4. H/o vit D def: Vit D has been replaced I recommend a daily supplement of 2000 IU.   You will be called with results   F/u in 3 months for chronic pain  Dr. Adrian Blackwater

## 2015-03-13 NOTE — Assessment & Plan Note (Signed)
1. Shortness of breath: I suspect GERD and allergic rhinitis and some element of anxiety. My suspicion for asthma or reactive airway disease is low.  Plan: prilosec and GERD diet for  Zyrtec for allergies  Continue flonase.  Referral to pulmonology for pulmonary function testing. You are on propranolol which is a type of beta blocker that could potentially worsen airway disease if present.

## 2015-03-13 NOTE — Assessment & Plan Note (Signed)
Depression symptoms: Counseling services available at [place], [place] and [place].  Decrease Cymbalta from 20 mg to 30 mg once daily

## 2015-03-13 NOTE — Assessment & Plan Note (Signed)
Pap done today  

## 2015-03-13 NOTE — Progress Notes (Signed)
   Subjective:    Patient ID: [name], female    DOB: [birth year], 29 y.o.   MRN: [id number] CC: SOB, pap smear  HPI 29 yo F with hx of bulimia, depression vs bipolar depression, anxiety, chronic pain, POTs syndrome, multiple somatic complaints   1. SOB: off and on since 2012. Symptoms are intermittent. Associated with upper airway tightness/"blockage" sensation in throat, reflux, anxiety. She denies coughing Worsened by extreme cold and heat. Patient last had symptoms in July 2015 as was given albuterol. She has been taking albuterol twice daily since then. She ran out 1 month ago.  Albuterol and raising her arms helped. She cam in today to discuss as per my instructions. She believes she wheezes but reports that the sound is initiated in her throat. She denies THC and tobacco smoking. Her sister smokes black and milds at home.  Her symptoms occur multiple times a day, interfere with activity, 2-3 nights per week, before bed. Symptoms never wake her up from sleep.   2. Pap smear:  Last pap in 2011. No hx of abnormal pap smears. No pelvic pain or vaginal discharge.   Soc Hx: denies tobacco and ETOH Review of Systems  Constitutional: Negative for fever and chills.  Respiratory: Positive for cough, chest tightness, shortness of breath and wheezing. Negative for apnea, choking and stridor.   Genitourinary: Negative for vaginal bleeding, vaginal discharge and vaginal pain.  Psychiatric/Behavioral: Positive for suicidal ideas and dysphoric mood. Negative for self-injury. The patient is nervous/anxious.    GAD-7: score of 21.3s down the board.     Objective:   Physical Exam BP 105/68 mmHg  Pulse 81  Temp(Src) 98.5 F (36.9 C) (Oral)  Resp 16  Ht 5\' 10"  (1.778 m)  Wt 138 lb (62.596 kg)  BMI 19.80 kg/m2  SpO2 100%  LMP 03/06/2015 General appearance: alert, cooperative and no distress Neck: no adenopathy, supple, symmetrical, trachea midline and thyroid not enlarged, symmetric, no  tenderness/mass/nodules  Eyes: conjunctivae/corneas clear. PERRL, EOM's intact.  Ears: normal TM's and external ear canals both ears except for black head L ear  Nose: no discharge, turbinates pink, swollen Throat: lips, mucosa, and tongue normal; teeth and gums normal Lungs: clear to auscultation bilaterally  Pelvic: cervix normal in appearance, external genitalia normal, no adnexal masses or tenderness, no cervical motion tenderness, positive findings: vaginal discharge:  normal and physiologic, scant and white, rectovaginal septum normal and uterus normal size, shape, and consistency  Pap done today      Assessment & Plan:

## 2015-03-13 NOTE — Assessment & Plan Note (Signed)
Chronic pain: normal ANA and sed rate in 04/2014.  additional rheumatological factor taking today.

## 2015-03-13 NOTE — Assessment & Plan Note (Signed)
H/o vit D def: Vit D has been replaced I recommend a daily supplement of 2000 IU.

## 2015-03-14 ENCOUNTER — Telehealth: Payer: Self-pay | Admitting: *Deleted

## 2015-03-14 DIAGNOSIS — M797 Fibromyalgia: Secondary | ICD-10-CM

## 2015-03-14 LAB — ANTI-DNA ANTIBODY, DOUBLE-STRANDED: ds DNA Ab: 1 IU/mL

## 2015-03-14 LAB — CBC
HCT: 40.3 % (ref 36.0–46.0)
Hemoglobin: 13.3 g/dL (ref 12.0–15.0)
MCH: 30.9 pg (ref 26.0–34.0)
MCHC: 33 g/dL (ref 30.0–36.0)
MCV: 93.5 fL (ref 78.0–100.0)
MPV: 10.1 fL (ref 8.6–12.4)
Platelets: 254 10*3/uL (ref 150–400)
RBC: 4.31 MIL/uL (ref 3.87–5.11)
RDW: 13.6 % (ref 11.5–15.5)
WBC: 5.3 10*3/uL (ref 4.0–10.5)

## 2015-03-14 LAB — CERVICOVAGINAL ANCILLARY ONLY
Chlamydia: NEGATIVE
Neisseria Gonorrhea: NEGATIVE
Wet Prep (BD Affirm): NEGATIVE

## 2015-03-14 LAB — SJOGRENS SYNDROME-A EXTRACTABLE NUCLEAR ANTIBODY: SSA (Ro) (ENA) Antibody, IgG: <1

## 2015-03-14 LAB — SJOGRENS SYNDROME-B EXTRACTABLE NUCLEAR ANTIBODY: SSB (La) (ENA) Antibody, IgG: <1

## 2015-03-14 LAB — ANA: Anti Nuclear Antibody(ANA): NEGATIVE

## 2015-03-14 NOTE — Telephone Encounter (Signed)
-----   Message from Boykin Nearing, MD sent at 03/14/2015  8:04 AM EDT ----- Normal CBC and CRP (inflammatory marker)

## 2015-03-14 NOTE — Telephone Encounter (Signed)
-----   Message from Boykin Nearing, MD sent at 03/14/2015 10:12 AM EDT ----- Negative wet prep, Negative screening GC/chlam

## 2015-03-14 NOTE — Telephone Encounter (Signed)
-----   Message from Boykin Nearing, MD sent at 03/14/2015  1:38 PM EDT ----- Labs to rule out rheumatological disease as cause of chronic are all normal. No rheumatological disease. Fibromyalgia most likely

## 2015-03-14 NOTE — Telephone Encounter (Signed)
Pt aware of results Stated what she can do for Fibromyalgia

## 2015-03-15 LAB — CYTOLOGY - PAP

## 2015-03-16 ENCOUNTER — Telehealth: Payer: Self-pay | Admitting: *Deleted

## 2015-03-16 NOTE — Telephone Encounter (Signed)
LVM to return call.

## 2015-03-16 NOTE — Telephone Encounter (Signed)
-----   Message from Boykin Nearing, MD sent at 03/15/2015  6:01 PM EDT ----- Negative pap smear

## 2015-03-19 NOTE — Telephone Encounter (Signed)
Pt aware of results 

## 2015-03-20 NOTE — Telephone Encounter (Signed)
Recommend continuing lyrica.  Starting counseling to help address psychosocial triggers for pain. Regular low impact exercises- like yoga or water exercises.

## 2015-03-21 NOTE — Telephone Encounter (Signed)
Pt stated Lyrica make her sleepy Exercise in the past and did not help  Has appointment with phycology

## 2015-03-22 NOTE — Telephone Encounter (Signed)
The combo of lyrica and cymbalta is one treatement that she had not responded to due to worsening depression and suicidal thoughts, so we are tapering off cymbalta.  lyrica alone, too sleepy, so dose cannot be decreased.   ? Other SSRI/SNRI/TCA (egs. Lexapro, zoloft, effexor, amitryptilline). What else has patient been on in the past? What has been the response to treatment?  Pain management referral placed today.  At this point psychiatry and pain management is needed.

## 2015-03-22 NOTE — Assessment & Plan Note (Addendum)
A: chronic pain w/o evidence of inflammatory or autoimmune disease in young, thin, female. Consistent with fibromyalgia. Symptoms and presentation complicated for significant personality and psychiatric abnormalities.   The combo of lyrica and cymbalta is one treatement that she had not responded to due to worsening depression and suicidal thoughts, so we are tapering off cymbalta.  lyrica alone, too sleepy, so dose cannot be decreased.   ? Other SSRI/SNRI/TCA (egs. Lexapro, zoloft, effexor, amitryptilline). What else has patient been on in the past? What has been the response to treatment?  P: Pain management referral placed today.  At this point psychiatry and pain management is needed.

## 2015-04-05 ENCOUNTER — Emergency Department (HOSPITAL_COMMUNITY): Payer: Self-pay

## 2015-04-05 ENCOUNTER — Encounter (HOSPITAL_COMMUNITY): Payer: Self-pay | Admitting: Emergency Medicine

## 2015-04-05 ENCOUNTER — Emergency Department (HOSPITAL_COMMUNITY)
Admission: EM | Admit: 2015-04-05 | Discharge: 2015-04-05 | Disposition: A | Discharge status: Home / Self Care | Payer: Self-pay | Attending: Emergency Medicine | Admitting: Emergency Medicine

## 2015-04-05 DIAGNOSIS — R011 Cardiac murmur, unspecified: Secondary | ICD-10-CM | POA: Insufficient documentation

## 2015-04-05 DIAGNOSIS — E559 Vitamin D deficiency, unspecified: Secondary | ICD-10-CM | POA: Insufficient documentation

## 2015-04-05 DIAGNOSIS — Z79899 Other long term (current) drug therapy: Secondary | ICD-10-CM | POA: Insufficient documentation

## 2015-04-05 DIAGNOSIS — G40909 Epilepsy, unspecified, not intractable, without status epilepticus: Secondary | ICD-10-CM | POA: Insufficient documentation

## 2015-04-05 DIAGNOSIS — Z7951 Long term (current) use of inhaled steroids: Secondary | ICD-10-CM | POA: Insufficient documentation

## 2015-04-05 DIAGNOSIS — R05 Cough: Secondary | ICD-10-CM | POA: Insufficient documentation

## 2015-04-05 DIAGNOSIS — Z87891 Personal history of nicotine dependence: Secondary | ICD-10-CM | POA: Insufficient documentation

## 2015-04-05 DIAGNOSIS — R42 Dizziness and giddiness: Secondary | ICD-10-CM | POA: Insufficient documentation

## 2015-04-05 DIAGNOSIS — G8929 Other chronic pain: Secondary | ICD-10-CM | POA: Insufficient documentation

## 2015-04-05 DIAGNOSIS — M791 Myalgia, unspecified site: Secondary | ICD-10-CM

## 2015-04-05 DIAGNOSIS — Z3202 Encounter for pregnancy test, result negative: Secondary | ICD-10-CM | POA: Insufficient documentation

## 2015-04-05 DIAGNOSIS — I10 Essential (primary) hypertension: Secondary | ICD-10-CM | POA: Insufficient documentation

## 2015-04-05 HISTORY — DX: Fibromyalgia: M79.7

## 2015-04-05 LAB — COMPREHENSIVE METABOLIC PANEL
ALT: 9 U/L — ABNORMAL LOW (ref 14–54)
AST: 14 U/L — ABNORMAL LOW (ref 15–41)
Albumin: 4 g/dL (ref 3.5–5.0)
Alkaline Phosphatase: 52 U/L (ref 38–126)
Anion gap: 2 — ABNORMAL LOW (ref 5–15)
BUN: 15 mg/dL (ref 6–20)
CO2: 25 mmol/L (ref 22–32)
Calcium: 8.6 mg/dL — ABNORMAL LOW (ref 8.9–10.3)
Chloride: 109 mmol/L (ref 101–111)
Creatinine, Ser: 0.52 mg/dL (ref 0.44–1.00)
GFR calc Af Amer: >60 mL/min (ref >60)
GFR calc non Af Amer: >60 mL/min (ref >60)
Glucose, Bld: 88 mg/dL (ref 65–99)
Potassium: 3.4 mmol/L — ABNORMAL LOW (ref 3.5–5.1)
Sodium: 136 mmol/L (ref 135–145)
Total Bilirubin: 0.9 mg/dL (ref 0.3–1.2)
Total Protein: 6.7 g/dL (ref 6.5–8.1)

## 2015-04-05 LAB — CBC WITH DIFFERENTIAL/PLATELET
Basophils Absolute: 0 10*3/uL (ref 0.0–0.1)
Basophils Relative: 0 % (ref 0–1)
Eosinophils Absolute: 0 10*3/uL (ref 0.0–0.7)
Eosinophils Relative: 0 % (ref 0–5)
HCT: 34.2 % — ABNORMAL LOW (ref 36.0–46.0)
Hemoglobin: 11.5 g/dL — ABNORMAL LOW (ref 12.0–15.0)
Lymphocytes Relative: 35 % (ref 12–46)
Lymphs Abs: 1.8 10*3/uL (ref 0.7–4.0)
MCH: 30.2 pg (ref 26.0–34.0)
MCHC: 33.6 g/dL (ref 30.0–36.0)
MCV: 89.8 fL (ref 78.0–100.0)
Monocytes Absolute: 0.2 10*3/uL (ref 0.1–1.0)
Monocytes Relative: 4 % (ref 3–12)
Neutro Abs: 3.2 10*3/uL (ref 1.7–7.7)
Neutrophils Relative %: 61 % (ref 43–77)
Platelets: 214 10*3/uL (ref 150–400)
RBC: 3.81 MIL/uL — ABNORMAL LOW (ref 3.87–5.11)
RDW: 12.3 % (ref 11.5–15.5)
WBC: 5.2 10*3/uL (ref 4.0–10.5)

## 2015-04-05 LAB — URINALYSIS, ROUTINE W REFLEX MICROSCOPIC
Bilirubin Urine: NEGATIVE
Glucose, UA: NEGATIVE mg/dL
Hgb urine dipstick: NEGATIVE
Ketones, ur: NEGATIVE mg/dL
Leukocytes, UA: NEGATIVE
Nitrite: NEGATIVE
Protein, ur: NEGATIVE mg/dL
Specific Gravity, Urine: 1.019 (ref 1.005–1.030)
Urobilinogen, UA: 1 mg/dL (ref 0.0–1.0)
pH: 7.5 (ref 5.0–8.0)

## 2015-04-05 LAB — POC URINE PREG, ED: Preg Test, Ur: NEGATIVE

## 2015-04-05 LAB — CK: Total CK: 121 U/L (ref 38–234)

## 2015-04-05 LAB — SEDIMENTATION RATE: Sed Rate: 2 mm/hr (ref 0–22)

## 2015-04-05 LAB — TROPONIN I: Troponin I: <0.03 ng/mL (ref <0.031)

## 2015-04-05 MED ORDER — PREDNISONE 20 MG PO TABS: 40.0000 mg | ORAL_TABLET | Freq: Every day | ORAL | Status: DC

## 2015-04-05 MED ORDER — SODIUM CHLORIDE 0.9 % IV BOLUS (SEPSIS)
1000.0000 mL | Freq: Once | INTRAVENOUS | Status: AC
Start: 2015-04-05 — End: 2015-04-05
  Administered 2015-04-05: 1000 mL via INTRAVENOUS

## 2015-04-05 NOTE — Discharge Instructions (Signed)
Follow with your doctor for further evaluation.

## 2015-04-05 NOTE — ED Notes (Signed)
LABS DRAWN BY TREVOR

## 2015-04-05 NOTE — ED Provider Notes (Signed)
CSN: [id number]     Arrival date & time 04/05/15  1029 History   First MD Initiated Contact with Patient 04/05/15 1049     Chief Complaint  Patient presents with  . Generalized Body Aches    chronic pain , increased this am  . Cough    dry cough     (Consider location/radiation/quality/duration/timing/severity/associated sxs/prior Treatment) Patient is a 29 y.o. female presenting with cough. The history is provided by the patient.  Cough Associated symptoms: myalgias   Associated symptoms: no fever    patient presents with generalized weakness and states that she hurts all over. Has had extensive workup for her muscle pain but is not found a clear cause. Patient states they're trying to figure out if his fibromyalgia. She is on Cymbalta. Patient states last few days she's had more pain in her skin. States it is everywhere. She also has felt lightheaded. She's had a little bit of cough. States she almost passed out earlier. No headache. No confusion. No chest pain. No dysuria.  Past Medical History  Diagnosis Date  . Seizures 2001  . Depression Dx 2000  . Panic attack Dx 2006  . Hypertension Dx [address]  . Heart murmur Dx 2015  . Arrhythmia Dx 2015  . PTSD (post-traumatic stress disorder) Dx 2013  . 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 Back pain   . Lumbar radiculopathy   . Vitamin D deficiency 01/24/2015  . Fibromyalgia    History reviewed. No pertinent past surgical history. Family History  Problem Relation Age of Onset  . Heart attack Mother   . Heart disease Mother   . Sudden death Mother   . Sudden death Sister   . Heart attack Sister   . Sudden death Maternal Grandmother   . Fainting Maternal Grandmother    History  Substance Use Topics  . Smoking status: Former Smoker    Quit date: 06/23/2012  . Smokeless tobacco: Never Used  . Alcohol Use: No   OB History    No data available     Review of Systems  Constitutional: Positive for fatigue.  Negative for fever.  HENT: Negative for facial swelling.   Respiratory: Positive for cough.   Gastrointestinal: Negative for abdominal pain.  Genitourinary: Negative for dysuria.  Musculoskeletal: Positive for myalgias.  Neurological: Positive for light-headedness. Negative for dizziness and syncope.  Psychiatric/Behavioral: Negative for confusion.      Allergies  Aspirin  Home Medications   Prior to Admission medications   Medication Sig Start Date End Date Taking? Authorizing Provider  atenolol (TENORMIN) 25 MG tablet Take 25 mg by mouth daily.   Yes Historical Provider, MD  docusate sodium (COLACE) 100 MG capsule Take 1 capsule (100 mg total) by mouth every 12 (twelve) hours. Patient taking differently: Take 100 mg by mouth 2 (two) times daily as needed for mild constipation.  01/30/15  Yes Audelia Hives Presson, PA  DULoxetine (CYMBALTA) 20 MG capsule Take 1 capsule (20 mg total) by mouth daily. 03/13/15  Yes Josalyn Funches, MD  fluticasone (FLONASE) 50 MCG/ACT nasal spray Place 2 sprays into both nostrils daily. 03/13/15  Yes Josalyn Funches, MD  gabapentin (NEURONTIN) 300 MG capsule Take 300 mg by mouth 3 (three) times daily.    Yes Historical Provider, MD  metoprolol tartrate (LOPRESSOR) 25 MG tablet Take 25 mg by mouth daily.   Yes Historical Provider, MD  naproxen (NAPROSYN) 500 MG tablet Take 1 tablet (500 mg total)  by mouth 2 (two) times daily as needed for mild pain, moderate pain or headache (TAKE WITH MEALS.). 11/12/14  Yes Mercedes Camprubi-Soms, PA-C  omeprazole (PRILOSEC) 40 MG capsule Take 1 capsule (40 mg total) by mouth daily. 03/13/15  Yes Josalyn Funches, MD  ondansetron (ZOFRAN ODT) 4 MG disintegrating tablet Take 1 tablet (4 mg total) by mouth every 8 (eight) hours as needed for nausea or vomiting. 11/12/14  Yes Mercedes Camprubi-Soms, PA-C  polyethylene glycol (MIRALAX / GLYCOLAX) packet Take 17 g by mouth daily. Mix into 8 ounces of water or juice and drink once  daily x 7 days 01/30/15  Yes Audelia Hives Presson, PA  propranolol ER (INDERAL LA) 80 MG 24 hr capsule Take 1 capsule (80 mg total) by mouth daily. 08/16/14  Yes Deboraha Sprang, MD  cetirizine (ZYRTEC) 10 MG tablet Take 1 tablet (10 mg total) by mouth daily. Patient not taking: Reported on 04/05/2015 03/13/15   Boykin Nearing, MD  Cholecalciferol (VITAMIN D3) 2000 UNITS TABS Take 1 tablet by mouth daily. Patient not taking: Reported on 04/05/2015 03/13/15   Boykin Nearing, MD  predniSONE (DELTASONE) 20 MG tablet Take 2 tablets (40 mg total) by mouth daily. 04/05/15   Davonna Belling, MD  pregabalin (LYRICA) 75 MG capsule Take 1 capsule (75 mg total) by mouth 3 (three) times daily. Patient not taking: Reported on 04/05/2015 12/22/14   Melvenia Beam, MD   BP 133/85 mmHg  Pulse 71  Temp(Src) 98 F (36.7 C) (Oral)  Resp 18  SpO2 99%  LMP 04/01/2015 Physical Exam  Constitutional: She appears well-developed and well-nourished.  HENT:  Head: Normocephalic.  Eyes: Pupils are equal, round, and reactive to light.  Neck: Neck supple.  Cardiovascular: Normal rate and regular rhythm.   Pulmonary/Chest: Effort normal.  Abdominal: Soft.  Musculoskeletal:  Mild diffuse tenderness over muscles.  Neurological: She is alert.  Skin: Skin is warm.    ED Course  Procedures (including critical care time) Labs Review Labs Reviewed  CBC WITH DIFFERENTIAL/PLATELET - Abnormal; Notable for the following:    RBC 3.81 (*)    Hemoglobin 11.5 (*)    HCT 34.2 (*)    All other components within normal limits  COMPREHENSIVE METABOLIC PANEL - Abnormal; Notable for the following:    Potassium 3.4 (*)    Calcium 8.6 (*)    AST 14 (*)    ALT 9 (*)    Anion gap 2 (*)    All other components within normal limits  URINALYSIS, ROUTINE W REFLEX MICROSCOPIC - Abnormal; Notable for the following:    APPearance CLOUDY (*)    All other components within normal limits  CK  SEDIMENTATION RATE  TROPONIN I  POC URINE  PREG, ED    Imaging Review Dg Chest 2 View  04/05/2015   CLINICAL DATA:  Increased chest wall pain  EXAM: CHEST  2 VIEW  COMPARISON:  10/23/2014  FINDINGS: The heart size and mediastinal contours are within normal limits. Both lungs are clear. The visualized skeletal structures are unremarkable.  IMPRESSION: No active cardiopulmonary disease.   Electronically Signed   By: Inez Catalina M.D.   On: 04/05/2015 13:33     EKG Interpretation None      MDM   Final diagnoses:  Myalgia    Patient with diffuse pain. Had some lightheadedness but feels better. IV fluids given. Has been worked up for her chronic pain. Not on narcotics now. Unknown cause of the worsening or of  the pain but will try three-day course of steroids just look for changes. Will discharge home follow with her PCP    Davonna Belling, MD 04/05/15 [phone]

## 2015-04-05 NOTE — ED Provider Notes (Signed)
MSE was initiated and I personally evaluated the patient and placed orders (if any) at  12:48 PM on Apr 05, 2015.  [name] is a 29 year old female with past history of seizures, depression, panic attacks, hypertension, heart murmur, radiculopathy of lumbar spine, hemoglobin AC, PTSD, fibromyalgia presenting to the ED with pain everywhere that started this morning. Patient reports that there is a constant dull aching pain is localized to her neck into her back. Patient reports she has been expressing headache and dizziness, patient reported that the dizziness is new. Stated that when she stands up and walks she feels completely off-balance feelings that she is going to fall. Patient reported that she had a near syncopal episode today. Patient reported that she's been having a cough that is nonproductive. Patient reported that she was seen and assessed by primary care provider approximate 2-3 weeks ago, is being worked up for labs regarding episodes of pain that is been ongoing for years-appears to be possible for her myalgia. Denied body swelling, fever, chills, chest pain, shortness of breath, difficulty breathing, bowel pain, nausea, vomiting, diarrhea, sore throat, difficulty swallowing, ear pain, numbness, tingling, melena, travels, fall, injury, blurred vision, sudden loss of vision, hemoptysis, travel, leg swelling, dysuria, hematuria, vaginal discharge, vaginal bleeding.  Alert and oriented 3. GCS 15. Patient appears lethargic. Heart rate and rhythm normal. Lungs clear to auscultation to upper and lower lobes bilaterally. Tenderness upon palpation to the entire body, even with soft touch localized mainly to the extremities. Equal grip strength. Patient follows commands well.  Concerns regarding lethargy of patient, dizziness with feeling off balance when walking. Patient to be moved back to the ED for further assessment and workup. Labs have been ordered.  The patient appears stable so that  the remainder of the MSE may be completed by another provider.  Jamse Mead, PA-C 04/05/15 [place], MD 04/05/15 [phone]

## 2015-04-05 NOTE — ED Notes (Signed)
Pt reports increased muscle pain and aches this am.Did not attempt to medicate. Denies NVD. Pt stated that she was recently diagnosed with fibromyalgia

## 2015-04-14 ENCOUNTER — Emergency Department (HOSPITAL_COMMUNITY)
Admission: EM | Admit: 2015-04-14 | Discharge: 2015-04-14 | Disposition: A | Discharge status: Home / Self Care | Payer: No Typology Code available for payment source | Attending: Emergency Medicine | Admitting: Emergency Medicine

## 2015-04-14 ENCOUNTER — Encounter (HOSPITAL_COMMUNITY): Payer: Self-pay | Admitting: *Deleted

## 2015-04-14 DIAGNOSIS — M25511 Pain in right shoulder: Secondary | ICD-10-CM | POA: Diagnosis not present

## 2015-04-14 DIAGNOSIS — R52 Pain, unspecified: Secondary | ICD-10-CM

## 2015-04-14 DIAGNOSIS — M542 Cervicalgia: Secondary | ICD-10-CM | POA: Insufficient documentation

## 2015-04-14 DIAGNOSIS — E559 Vitamin D deficiency, unspecified: Secondary | ICD-10-CM | POA: Insufficient documentation

## 2015-04-14 DIAGNOSIS — R011 Cardiac murmur, unspecified: Secondary | ICD-10-CM | POA: Diagnosis not present

## 2015-04-14 DIAGNOSIS — Z79899 Other long term (current) drug therapy: Secondary | ICD-10-CM | POA: Diagnosis not present

## 2015-04-14 DIAGNOSIS — F410 Panic disorder [episodic paroxysmal anxiety] without agoraphobia: Secondary | ICD-10-CM | POA: Diagnosis not present

## 2015-04-14 DIAGNOSIS — Z3202 Encounter for pregnancy test, result negative: Secondary | ICD-10-CM | POA: Insufficient documentation

## 2015-04-14 DIAGNOSIS — Z7952 Long term (current) use of systemic steroids: Secondary | ICD-10-CM | POA: Insufficient documentation

## 2015-04-14 DIAGNOSIS — Z7951 Long term (current) use of inhaled steroids: Secondary | ICD-10-CM | POA: Insufficient documentation

## 2015-04-14 DIAGNOSIS — R51 Headache: Secondary | ICD-10-CM | POA: Diagnosis not present

## 2015-04-14 DIAGNOSIS — R0789 Other chest pain: Secondary | ICD-10-CM | POA: Diagnosis not present

## 2015-04-14 DIAGNOSIS — M797 Fibromyalgia: Secondary | ICD-10-CM | POA: Insufficient documentation

## 2015-04-14 DIAGNOSIS — F329 Major depressive disorder, single episode, unspecified: Secondary | ICD-10-CM | POA: Diagnosis not present

## 2015-04-14 DIAGNOSIS — Z87891 Personal history of nicotine dependence: Secondary | ICD-10-CM | POA: Diagnosis not present

## 2015-04-14 DIAGNOSIS — I10 Essential (primary) hypertension: Secondary | ICD-10-CM | POA: Insufficient documentation

## 2015-04-14 DIAGNOSIS — G8911 Acute pain due to trauma: Secondary | ICD-10-CM | POA: Diagnosis not present

## 2015-04-14 DIAGNOSIS — R079 Chest pain, unspecified: Secondary | ICD-10-CM | POA: Diagnosis present

## 2015-04-14 DIAGNOSIS — G40909 Epilepsy, unspecified, not intractable, without status epilepticus: Secondary | ICD-10-CM | POA: Diagnosis not present

## 2015-04-14 LAB — POC URINE PREG, ED: Preg Test, Ur: NEGATIVE

## 2015-04-14 MED ORDER — MORPHINE SULFATE 4 MG/ML IJ SOLN
4.0000 mg | Freq: Once | INTRAMUSCULAR | Status: AC
  Administered 2015-04-14: 4 mg via INTRAMUSCULAR
  Filled 2015-04-14: qty 1

## 2015-04-14 MED ORDER — ONDANSETRON 8 MG PO TBDP
8.0000 mg | ORAL_TABLET | Freq: Once | ORAL | Status: AC
  Administered 2015-04-14: 8 mg via ORAL
  Filled 2015-04-14: qty 1

## 2015-04-14 MED ORDER — HYDROCODONE-ACETAMINOPHEN 5-325 MG PO TABS: 1.0000 | ORAL_TABLET | Freq: Four times a day (QID) | ORAL | Status: DC | PRN

## 2015-04-14 MED ORDER — KETOROLAC TROMETHAMINE 60 MG/2ML IM SOLN
60.0000 mg | Freq: Once | INTRAMUSCULAR | Status: AC
  Administered 2015-04-14: 60 mg via INTRAMUSCULAR
  Filled 2015-04-14: qty 2

## 2015-04-14 NOTE — Discharge Instructions (Signed)
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
## 2015-04-14 NOTE — ED Notes (Signed)
Pt reports that she has a sling from 2 days ago when she was seen at this facility and sts that she doesn't need the one that was ordered
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
## 2015-04-25 ENCOUNTER — Other Ambulatory Visit: Payer: Self-pay | Admitting: Family Medicine

## 2015-04-25 DIAGNOSIS — R0600 Dyspnea, unspecified: Secondary | ICD-10-CM

## 2015-04-27 ENCOUNTER — Ambulatory Visit: Payer: Self-pay | Admitting: Family Medicine

## 2015-04-30 DIAGNOSIS — Z0289 Encounter for other administrative examinations: Secondary | ICD-10-CM

## 2015-07-08 ENCOUNTER — Encounter (HOSPITAL_COMMUNITY): Payer: Self-pay | Admitting: Oncology

## 2015-07-08 ENCOUNTER — Emergency Department (HOSPITAL_COMMUNITY)
Admission: EM | Admit: 2015-07-08 | Discharge: 2015-07-08 | Disposition: A | Discharge status: Home / Self Care | Payer: Medicaid Other | Attending: Emergency Medicine | Admitting: Emergency Medicine

## 2015-07-08 ENCOUNTER — Emergency Department (HOSPITAL_COMMUNITY): Payer: Medicaid Other

## 2015-07-08 DIAGNOSIS — Z79899 Other long term (current) drug therapy: Secondary | ICD-10-CM | POA: Insufficient documentation

## 2015-07-08 DIAGNOSIS — S022XXA Fracture of nasal bones, initial encounter for closed fracture: Secondary | ICD-10-CM

## 2015-07-08 DIAGNOSIS — G40909 Epilepsy, unspecified, not intractable, without status epilepticus: Secondary | ICD-10-CM | POA: Insufficient documentation

## 2015-07-08 DIAGNOSIS — S0181XA Laceration without foreign body of other part of head, initial encounter: Secondary | ICD-10-CM

## 2015-07-08 DIAGNOSIS — Z862 Personal history of diseases of the blood and blood-forming organs and certain disorders involving the immune mechanism: Secondary | ICD-10-CM | POA: Insufficient documentation

## 2015-07-08 DIAGNOSIS — Z8739 Personal history of other diseases of the musculoskeletal system and connective tissue: Secondary | ICD-10-CM | POA: Insufficient documentation

## 2015-07-08 DIAGNOSIS — Z87891 Personal history of nicotine dependence: Secondary | ICD-10-CM | POA: Insufficient documentation

## 2015-07-08 DIAGNOSIS — F329 Major depressive disorder, single episode, unspecified: Secondary | ICD-10-CM | POA: Insufficient documentation

## 2015-07-08 DIAGNOSIS — Z7951 Long term (current) use of inhaled steroids: Secondary | ICD-10-CM | POA: Insufficient documentation

## 2015-07-08 DIAGNOSIS — Y998 Other external cause status: Secondary | ICD-10-CM | POA: Insufficient documentation

## 2015-07-08 DIAGNOSIS — R011 Cardiac murmur, unspecified: Secondary | ICD-10-CM | POA: Insufficient documentation

## 2015-07-08 DIAGNOSIS — I10 Essential (primary) hypertension: Secondary | ICD-10-CM | POA: Insufficient documentation

## 2015-07-08 DIAGNOSIS — Y9389 Activity, other specified: Secondary | ICD-10-CM | POA: Insufficient documentation

## 2015-07-08 DIAGNOSIS — E559 Vitamin D deficiency, unspecified: Secondary | ICD-10-CM | POA: Insufficient documentation

## 2015-07-08 DIAGNOSIS — W228XXA Striking against or struck by other objects, initial encounter: Secondary | ICD-10-CM | POA: Insufficient documentation

## 2015-07-08 DIAGNOSIS — Y9289 Other specified places as the place of occurrence of the external cause: Secondary | ICD-10-CM | POA: Insufficient documentation

## 2015-07-08 MED ORDER — HYDROCODONE-ACETAMINOPHEN 5-325 MG PO TABS: 1.0000 | ORAL_TABLET | Freq: Four times a day (QID) | ORAL | Status: DC | PRN

## 2015-07-08 MED ORDER — HYDROCODONE-ACETAMINOPHEN 5-325 MG PO TABS
1.0000 | ORAL_TABLET | Freq: Once | ORAL | Status: AC
  Administered 2015-07-08: 1 via ORAL
  Filled 2015-07-08: qty 1

## 2015-07-08 NOTE — ED Notes (Signed)
Pt transported to CT ?

## 2015-07-08 NOTE — ED Notes (Signed)
Per pt she fell and hit the dresser causing a laceration to the bridge of her nose.  Swelling present.  Denies LOC.  Pt A&O x 4.  Ambulatory to triage.

## 2015-07-08 NOTE — ED Provider Notes (Signed)
CSN: [id number]     Arrival date & time 07/08/15  0147 History   First MD Initiated Contact with Patient 07/08/15 0247     Chief Complaint  Patient presents with  . Facial Injury    nose     (Consider location/radiation/quality/duration/timing/severity/associated sxs/prior Treatment) HPI Comments: Patient states she leaned over to get something out of her bedside stand hitting her nose on the edge of the cabinet.  She has a small laceration to the bridge of her nose and swelling.  Denies any loss of consciousness, dizziness, nausea.  Epistaxis.  States her tetanus status is up-to-date  Patient is a 29 y.o. female presenting with facial injury. The history is provided by the patient.  Facial Injury Mechanism of injury:  Direct blow Location:  Nose Pain details:    Quality:  Aching   Severity:  Mild   Timing:  Constant   Progression:  Unchanged Chronicity:  New Foreign body present:  No foreign bodies Relieved by:  None tried Worsened by:  Pressure Ineffective treatments:  None tried Associated symptoms: no altered mental status, no congestion, no epistaxis, no headaches, no loss of consciousness, no malocclusion, no nausea, no neck pain and no rhinorrhea     Past Medical History  Diagnosis Date  . Seizures 2001  . Depression Dx 2000  . Panic attack Dx 2006  . Hypertension Dx [address]  . Heart murmur Dx 2015  . Arrhythmia Dx 2015  . PTSD (post-traumatic stress disorder) Dx 2013  . 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 Back pain   . Lumbar radiculopathy   . Vitamin D deficiency 01/24/2015  . Fibromyalgia    History reviewed. No pertinent past surgical history. Family History  Problem Relation Age of Onset  . Heart attack Mother   . Heart disease Mother   . Sudden death Mother   . Sudden death Sister   . Heart attack Sister   . Sudden death Maternal Grandmother   . Fainting Maternal Grandmother    Social History  Substance Use Topics  . Smoking status:  Former Smoker    Quit date: 06/23/2012  . Smokeless tobacco: Never Used  . Alcohol Use: No   OB History    No data available     Review of Systems  HENT: Positive for facial swelling. Negative for congestion, nosebleeds, rhinorrhea and sinus pressure.   Eyes: Negative for visual disturbance.  Respiratory: Negative for cough.   Gastrointestinal: Negative for nausea.  Musculoskeletal: Negative for neck pain.  Neurological: Negative for dizziness, loss of consciousness and headaches.  All other systems reviewed and are negative.     Allergies  Aspirin  Home Medications   Prior to Admission medications   Medication Sig Start Date End Date Taking? Authorizing Provider  DULoxetine (CYMBALTA) 20 MG capsule Take 1 capsule (20 mg total) by mouth daily. 03/13/15  Yes Josalyn Funches, MD  fluticasone (FLONASE) 50 MCG/ACT nasal spray Place 2 sprays into both nostrils daily. Patient taking differently: Place 2 sprays into both nostrils 2 (two) times daily.  03/13/15  Yes Josalyn Funches, MD  gabapentin (NEURONTIN) 300 MG capsule Take 300 mg by mouth 3 (three) times daily.    Yes Historical Provider, MD  methocarbamol (ROBAXIN) 500 MG tablet Take 500 mg by mouth every 6 (six) hours as needed for muscle spasms.   Yes Historical Provider, MD  naproxen (NAPROSYN) 500 MG tablet Take 1 tablet (500 mg total) by mouth 2 (two) times  daily as needed for mild pain, moderate pain or headache (TAKE WITH MEALS.). 11/12/14  Yes Mercedes Camprubi-Soms, PA-C  omeprazole (PRILOSEC) 40 MG capsule Take 1 capsule (40 mg total) by mouth daily. 03/13/15  Yes Josalyn Funches, MD  polyethylene glycol (MIRALAX / GLYCOLAX) packet Take 17 g by mouth daily. Mix into 8 ounces of water or juice and drink once daily x 7 days 01/30/15  Yes Lutricia Feil, PA  pregabalin (LYRICA) 75 MG capsule Take 1 capsule (75 mg total) by mouth 3 (three) times daily. 12/22/14  Yes Melvenia Beam, MD  propranolol ER (INDERAL LA) 80 MG 24  hr capsule Take 1 capsule (80 mg total) by mouth daily. 08/16/14  Yes Deboraha Sprang, MD  cetirizine (ZYRTEC) 10 MG tablet Take 1 tablet (10 mg total) by mouth daily. Patient not taking: Reported on 07/08/2015 03/13/15   Boykin Nearing, MD  Cholecalciferol (VITAMIN D3) 2000 UNITS TABS Take 1 tablet by mouth daily. Patient not taking: Reported on 04/05/2015 03/13/15   Boykin Nearing, MD  docusate sodium (COLACE) 100 MG capsule Take 1 capsule (100 mg total) by mouth every 12 (twelve) hours. Patient taking differently: Take 100 mg by mouth 2 (two) times daily as needed for mild constipation.  01/30/15   Lutricia Feil, PA  HYDROcodone-acetaminophen (NORCO/VICODIN) 5-325 MG per tablet Take 1 tablet by mouth every 6 (six) hours as needed for moderate pain. 07/08/15   Junius Creamer, NP  ondansetron (ZOFRAN ODT) 4 MG disintegrating tablet Take 1 tablet (4 mg total) by mouth every 8 (eight) hours as needed for nausea or vomiting. Patient not taking: Reported on 07/08/2015 11/12/14   Mercedes Camprubi-Soms, PA-C  predniSONE (DELTASONE) 20 MG tablet Take 2 tablets (40 mg total) by mouth daily. Patient not taking: Reported on 07/08/2015 04/05/15   Davonna Belling, MD   BP 127/81 mmHg  Pulse 81  Temp(Src) 97.8 F (36.6 C) (Oral)  Resp 19  Ht 5\' 10"  (1.778 m)  Wt 134 lb (60.782 kg)  BMI 19.23 kg/m2  SpO2 96%  LMP 07/07/2015 (Exact Date) Physical Exam  Constitutional: She is oriented to person, place, and time. She appears well-developed and well-nourished.  HENT:  Head: Normocephalic.    Right Ear: External ear normal.  Left Ear: External ear normal.  Nose: Right sinus exhibits no maxillary sinus tenderness and no frontal sinus tenderness. Left sinus exhibits no maxillary sinus tenderness and no frontal sinus tenderness.    Mouth/Throat: Oropharynx is clear and moist.  Eyes: Pupils are equal, round, and reactive to light.  Neck: Normal range of motion.  Cardiovascular: Normal rate and regular  rhythm.   Pulmonary/Chest: Effort normal and breath sounds normal.  Musculoskeletal: Normal range of motion. She exhibits no edema or tenderness.  Neurological: She is alert and oriented to person, place, and time.  Skin: Skin is warm and dry.  Nursing note and vitals reviewed.   ED Course  Procedures (including critical care time) Labs Review Labs Reviewed - No data to display  Imaging Review Ct Maxillofacial Wo Cm  07/08/2015   CLINICAL DATA:  Status post fall against dresser. Evaluate nasal injury. Initial encounter.  EXAM: CT MAXILLOFACIAL WITHOUT CONTRAST  TECHNIQUE: Multidetector CT imaging of the maxillofacial structures was performed. Multiplanar CT image reconstructions were also generated. A small metallic BB was placed on the right temple in order to reliably differentiate right from left.  COMPARISON:  MRI of the brain performed 01/22/2015, and maxillofacial CT performed 04/07/2010  FINDINGS: There is suggestion of a slightly depressed left-sided nasal bone fracture.  The maxilla and mandible appear intact. The visualized dentition demonstrates no acute abnormality.  The orbits are intact bilaterally. The visualized paranasal sinuses and mastoid air cells are well-aerated.  No significant soft tissue abnormalities are seen. The parapharyngeal fat planes are preserved. The nasopharynx, oropharynx and hypopharynx are unremarkable in appearance. The visualized portions of the valleculae and piriform sinuses are grossly unremarkable.  The parotid and submandibular glands are within normal limits. No cervical lymphadenopathy is seen. The visualized portions of the brain are unremarkable.  IMPRESSION: 1. Suggestion of a slightly depressed left-sided nasal bone fracture. 2. Otherwise unremarkable CT of the maxillofacial structures.   Electronically Signed   By: Garald Balding M.D.   On: 07/08/2015 03:45   I, Inaya Gillham K, personally reviewed and evaluated these images and lab results as part  of my medical decision-making.   EKG Interpretation None     I discussed with patient care.  She will follow-up with the ENT doctor in 7-10 days.  She will be given a prescription for Vicodin.  She's been given return precautions  MDM   Final diagnoses:  Nasal bone fracture, closed, initial encounter  Facial laceration, initial encounter         Junius Creamer, NP 07/08/15 [place], NP 07/08/15 [place], MD 07/08/15 2213

## 2015-07-08 NOTE — ED Notes (Signed)
Pt alert, oriented, and ambulatory upon DC. She was advised to follow up with ENT and take prescribed meds. She leaves with DC papers and script for vicodin in hand. Family member driving her home. Unable to have patient to E-sign due to signature pad failure.

## 2015-07-08 NOTE — ED Notes (Signed)
NP [name] made aware that patient would like to be discharged.

## 2015-07-08 NOTE — ED Notes (Addendum)
Pt holding ice to face. Pt alert and oriented. Pain meds administered. Updated patient on awaiting for CT. Will continue to monitor. Family with patient.

## 2015-07-08 NOTE — Discharge Instructions (Signed)
You have a suggestion of a left nasal bone fracture that may be slightly depressed. And a superficial laceration that does not need sutures  You have been given a prescription for pain medication and referral to ENT for further evaluation Make the appointment for the end of next week to allow the swelling to decrease to allow for a better exam

## 2015-08-14 ENCOUNTER — Emergency Department (HOSPITAL_COMMUNITY)
Admission: EM | Admit: 2015-08-14 | Discharge: 2015-08-14 | Disposition: A | Discharge status: Home / Self Care | Payer: Medicaid Other | Attending: Physician Assistant | Admitting: Physician Assistant

## 2015-08-14 ENCOUNTER — Encounter (HOSPITAL_COMMUNITY): Payer: Self-pay

## 2015-08-14 ENCOUNTER — Ambulatory Visit (HOSPITAL_COMMUNITY): Admission: AD | Admit: 2015-08-14 | Payer: Self-pay | Source: Ambulatory Visit | Admitting: Gastroenterology

## 2015-08-14 ENCOUNTER — Encounter (HOSPITAL_COMMUNITY)
Admission: EM | Disposition: A | Discharge status: Home / Self Care | Payer: Self-pay | Source: Home / Self Care | Attending: Physician Assistant

## 2015-08-14 DIAGNOSIS — Z7951 Long term (current) use of inhaled steroids: Secondary | ICD-10-CM | POA: Insufficient documentation

## 2015-08-14 DIAGNOSIS — Z87891 Personal history of nicotine dependence: Secondary | ICD-10-CM | POA: Insufficient documentation

## 2015-08-14 DIAGNOSIS — Z791 Long term (current) use of non-steroidal anti-inflammatories (NSAID): Secondary | ICD-10-CM | POA: Insufficient documentation

## 2015-08-14 DIAGNOSIS — F329 Major depressive disorder, single episode, unspecified: Secondary | ICD-10-CM | POA: Insufficient documentation

## 2015-08-14 DIAGNOSIS — T6591XA Toxic effect of unspecified substance, accidental (unintentional), initial encounter: Secondary | ICD-10-CM | POA: Insufficient documentation

## 2015-08-14 DIAGNOSIS — T5491XA Toxic effect of unspecified corrosive substance, accidental (unintentional), initial encounter: Secondary | ICD-10-CM

## 2015-08-14 DIAGNOSIS — I10 Essential (primary) hypertension: Secondary | ICD-10-CM | POA: Insufficient documentation

## 2015-08-14 DIAGNOSIS — E559 Vitamin D deficiency, unspecified: Secondary | ICD-10-CM | POA: Insufficient documentation

## 2015-08-14 DIAGNOSIS — M797 Fibromyalgia: Secondary | ICD-10-CM | POA: Insufficient documentation

## 2015-08-14 DIAGNOSIS — Z79899 Other long term (current) drug therapy: Secondary | ICD-10-CM | POA: Insufficient documentation

## 2015-08-14 HISTORY — PX: ESOPHAGOGASTRODUODENOSCOPY: SHX5428

## 2015-08-14 LAB — CBC
HCT: 35.9 % — ABNORMAL LOW (ref 36.0–46.0)
Hemoglobin: 12.6 g/dL (ref 12.0–15.0)
MCH: 30.8 pg (ref 26.0–34.0)
MCHC: 35.1 g/dL (ref 30.0–36.0)
MCV: 87.8 fL (ref 78.0–100.0)
Platelets: 209 10*3/uL (ref 150–400)
RBC: 4.09 MIL/uL (ref 3.87–5.11)
RDW: 12.6 % (ref 11.5–15.5)
WBC: 5.6 10*3/uL (ref 4.0–10.5)

## 2015-08-14 LAB — URINALYSIS, ROUTINE W REFLEX MICROSCOPIC
Bilirubin Urine: NEGATIVE
Glucose, UA: NEGATIVE mg/dL
Hgb urine dipstick: NEGATIVE
Ketones, ur: NEGATIVE mg/dL
Leukocytes, UA: NEGATIVE
Nitrite: NEGATIVE
Protein, ur: NEGATIVE mg/dL
Specific Gravity, Urine: 1.008 (ref 1.005–1.030)
Urobilinogen, UA: 0.2 mg/dL (ref 0.0–1.0)
pH: 7 (ref 5.0–8.0)

## 2015-08-14 LAB — COMPREHENSIVE METABOLIC PANEL
ALT: 9 U/L — ABNORMAL LOW (ref 14–54)
AST: 18 U/L (ref 15–41)
Albumin: 4.6 g/dL (ref 3.5–5.0)
Alkaline Phosphatase: 60 U/L (ref 38–126)
Anion gap: 8 (ref 5–15)
BUN: 15 mg/dL (ref 6–20)
CO2: 27 mmol/L (ref 22–32)
Calcium: 9.1 mg/dL (ref 8.9–10.3)
Chloride: 105 mmol/L (ref 101–111)
Creatinine, Ser: 0.58 mg/dL (ref 0.44–1.00)
GFR calc Af Amer: >60 mL/min (ref >60)
GFR calc non Af Amer: >60 mL/min (ref >60)
Glucose, Bld: 70 mg/dL (ref 65–99)
Potassium: 3.3 mmol/L — ABNORMAL LOW (ref 3.5–5.1)
Sodium: 140 mmol/L (ref 135–145)
Total Bilirubin: 0.9 mg/dL (ref 0.3–1.2)
Total Protein: 7.2 g/dL (ref 6.5–8.1)

## 2015-08-14 LAB — LIPASE, BLOOD: Lipase: 15 U/L — ABNORMAL LOW (ref 22–51)

## 2015-08-14 LAB — POC URINE PREG, ED: Preg Test, Ur: NEGATIVE

## 2015-08-14 SURGERY — EGD (ESOPHAGOGASTRODUODENOSCOPY)
Anesthesia: Moderate Sedation

## 2015-08-14 MED ORDER — ONDANSETRON HCL 4 MG/2ML IJ SOLN
4.0000 mg | Freq: Once | INTRAMUSCULAR | Status: AC
  Administered 2015-08-14: 4 mg via INTRAVENOUS
  Filled 2015-08-14: qty 2

## 2015-08-14 MED ORDER — FENTANYL CITRATE (PF) 100 MCG/2ML IJ SOLN
INTRAMUSCULAR | Status: DC | PRN
  Administered 2015-08-14 (×3): 25 ug via INTRAVENOUS

## 2015-08-14 MED ORDER — DIPHENHYDRAMINE HCL 50 MG/ML IJ SOLN
INTRAMUSCULAR | Status: DC | PRN
  Administered 2015-08-14 (×2): 12.5 mg via INTRAVENOUS

## 2015-08-14 MED ORDER — MIDAZOLAM HCL 10 MG/2ML IJ SOLN
INTRAMUSCULAR | Status: DC | PRN
  Administered 2015-08-14: 1 mg via INTRAVENOUS
  Administered 2015-08-14 (×2): 2 mg via INTRAVENOUS

## 2015-08-14 MED ORDER — MIDAZOLAM HCL 5 MG/ML IJ SOLN
INTRAMUSCULAR | Status: AC
  Filled 2015-08-14: qty 2

## 2015-08-14 MED ORDER — FENTANYL CITRATE (PF) 100 MCG/2ML IJ SOLN
INTRAMUSCULAR | Status: AC
  Filled 2015-08-14: qty 2

## 2015-08-14 MED ORDER — DIPHENHYDRAMINE HCL 50 MG/ML IJ SOLN
INTRAMUSCULAR | Status: AC
  Filled 2015-08-14: qty 1

## 2015-08-14 NOTE — ED Provider Notes (Signed)
CSN: [id number]     Arrival date & time 08/14/15  45 History   First MD Initiated Contact with Patient 08/14/15 1333     Chief Complaint  Patient presents with  . Poisoning/ Abdominal Pain      (Consider location/radiation/quality/duration/timing/severity/associated sxs/prior Treatment) HPI  Patient is a 29 year old meet female presenting with what she states is a caustic ingestion earlier today. Patient went to grab a cup of hot chocolate. She says she poured hot chocolate and started to drink it and it tasted funny she continued to drink about half of it and then realized that he sje been cleaning fluid not hot chocolate. She talked to the cleaning folk and they're unsure what happened.  She had her call he tries in the agreed that it was not hot chocolate. Patient called poison control. Poison control states that if she is having symptoms such as pain or throat and pain with swallowing she should come here to emergency department for a scope. Poison control said it could've been a caustic ingestion.   Past Medical History  Diagnosis Date  . Seizures 2001  . Depression Dx 2000  . Panic attack Dx 2006  . Hypertension Dx 84166  . Heart murmur Dx 2015  . Arrhythmia Dx 2015  . PTSD (post-traumatic stress disorder) Dx 2013  . 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 Back pain   . Lumbar radiculopathy   . Vitamin D deficiency 01/24/2015  . Fibromyalgia    No past surgical history on file. Family History  Problem Relation Age of Onset  . Heart attack Mother   . Heart disease Mother   . Sudden death Mother   . Sudden death Sister   . Heart attack Sister   . Sudden death Maternal Grandmother   . Fainting Maternal Grandmother    Social History  Substance Use Topics  . Smoking status: Former Smoker    Quit date: 06/23/2012  . Smokeless tobacco: Never Used  . Alcohol Use: No   OB History    No data available     Review of Systems  Constitutional: Negative for  activity change and fatigue.  HENT: Positive for sore throat and trouble swallowing. Negative for congestion, drooling and voice change.   Eyes: Negative for discharge.  Respiratory: Negative for cough and chest tightness.   Cardiovascular: Negative for chest pain.  Gastrointestinal: Negative for abdominal distention.  Genitourinary: Negative for dysuria and difficulty urinating.  Musculoskeletal: Negative for joint swelling.  Skin: Negative for rash.  Allergic/Immunologic: Negative for immunocompromised state.  Neurological: Negative for seizures and speech difficulty.  Psychiatric/Behavioral: Negative for behavioral problems and agitation.      Allergies  Aspirin  Home Medications   Prior to Admission medications   Medication Sig Start Date End Date Taking? Authorizing Provider  docusate sodium (COLACE) 100 MG capsule Take 1 capsule (100 mg total) by mouth every 12 (twelve) hours. Patient taking differently: Take 100 mg by mouth 2 (two) times daily as needed for mild constipation.  01/30/15  Yes Audelia Hives Presson, PA  DULoxetine (CYMBALTA) 20 MG capsule Take 1 capsule (20 mg total) by mouth daily. 03/13/15  Yes Josalyn Funches, MD  fluticasone (FLONASE) 50 MCG/ACT nasal spray Place 2 sprays into both nostrils daily. Patient taking differently: Place 2 sprays into both nostrils 2 (two) times daily as needed for allergies.  03/13/15  Yes Josalyn Funches, MD  gabapentin (NEURONTIN) 300 MG capsule Take 300 mg by mouth  3 (three) times daily.    Yes Historical Provider, MD  methocarbamol (ROBAXIN) 500 MG tablet Take 500 mg by mouth every 6 (six) hours as needed for muscle spasms.   Yes Historical Provider, MD  naproxen (NAPROSYN) 500 MG tablet Take 1 tablet (500 mg total) by mouth 2 (two) times daily as needed for mild pain, moderate pain or headache (TAKE WITH MEALS.). 11/12/14  Yes Mercedes Camprubi-Soms, PA-C  omeprazole (PRILOSEC) 40 MG capsule Take 1 capsule (40 mg total) by mouth  daily. 03/13/15  Yes Josalyn Funches, MD  polyethylene glycol (MIRALAX / GLYCOLAX) packet Take 17 g by mouth daily. Mix into 8 ounces of water or juice and drink once daily x 7 days Patient taking differently: Take 17 g by mouth daily as needed for mild constipation. Mix into 8 ounces of water or juice and drink once daily x 7 days 01/30/15  Yes Lutricia Feil, PA  pregabalin (LYRICA) 75 MG capsule Take 1 capsule (75 mg total) by mouth 3 (three) times daily. 12/22/14  Yes Melvenia Beam, MD  promethazine (PHENERGAN) 25 MG tablet Take 25 mg by mouth every 6 (six) hours as needed for nausea or vomiting.   Yes Historical Provider, MD  propranolol ER (INDERAL LA) 80 MG 24 hr capsule Take 1 capsule (80 mg total) by mouth daily. 08/16/14  Yes Deboraha Sprang, MD  cetirizine (ZYRTEC) 10 MG tablet Take 1 tablet (10 mg total) by mouth daily. Patient not taking: Reported on 07/08/2015 03/13/15   Boykin Nearing, MD  Cholecalciferol (VITAMIN D3) 2000 UNITS TABS Take 1 tablet by mouth daily. Patient not taking: Reported on 04/05/2015 03/13/15   Boykin Nearing, MD  HYDROcodone-acetaminophen (NORCO/VICODIN) 5-325 MG per tablet Take 1 tablet by mouth every 6 (six) hours as needed for moderate pain. Patient not taking: Reported on 08/14/2015 07/08/15   Junius Creamer, NP  ondansetron (ZOFRAN ODT) 4 MG disintegrating tablet Take 1 tablet (4 mg total) by mouth every 8 (eight) hours as needed for nausea or vomiting. Patient not taking: Reported on 07/08/2015 11/12/14   Mercedes Camprubi-Soms, PA-C  predniSONE (DELTASONE) 20 MG tablet Take 2 tablets (40 mg total) by mouth daily. Patient not taking: Reported on 07/08/2015 04/05/15   Davonna Belling, MD   BP 128/80 mmHg  Pulse 81  Temp(Src) 98.7 F (37.1 C) (Oral)  Resp 17  SpO2 100% Physical Exam  Constitutional: She is oriented to person, place, and time. She appears well-developed and well-nourished.  HENT:  Head: Normocephalic and atraumatic.  Eyes: Conjunctivae  are normal. Right eye exhibits no discharge.  Neck: Neck supple.  Cardiovascular: Normal rate, regular rhythm and normal heart sounds.   No murmur heard. Pulmonary/Chest: Effort normal and breath sounds normal. She has no wheezes. She has no rales.  Abdominal: Soft. She exhibits no distension. There is no tenderness.  Musculoskeletal: Normal range of motion. She exhibits no edema.  Neurological: She is oriented to person, place, and time. No cranial nerve deficit.  Skin: Skin is warm and dry. No rash noted. She is not diaphoretic.  Psychiatric: She has a normal mood and affect. Her behavior is normal.  Nursing note and vitals reviewed.   ED Course  Procedures (including critical care time) Labs Review Labs Reviewed  LIPASE, BLOOD - Abnormal; Notable for the following:    Lipase 15 (*)    All other components within normal limits  COMPREHENSIVE METABOLIC PANEL - Abnormal; Notable for the following:    Potassium 3.3 (*)  ALT 9 (*)    All other components within normal limits  CBC - Abnormal; Notable for the following:    HCT 35.9 (*)    All other components within normal limits  URINALYSIS, ROUTINE W REFLEX MICROSCOPIC (NOT AT [facility])  POC URINE PREG, ED    Imaging Review No results found. I have personally reviewed and evaluated these images and lab results as part of my medical decision-making.   EKG Interpretation None      MDM   Final diagnoses:  None    Patient is a 29 year old female presenting here with potential caustic ingestion. She is unsure exactly what she ingested. However she's having residual pain on swallowing and a sore throat. We talked to GI. Dr.Huang on GI is going to scope her and then the plan is to discharge patient if there is no evidence of caustic ingestion.      Sequoyah Ramone Julio Alm, MD 08/14/15 1541

## 2015-08-14 NOTE — ED Notes (Signed)
MD at bedside. 

## 2015-08-14 NOTE — Consult Note (Signed)
Reason for Consult: Caustic ingestion Referring Physician: ER  [name] HPI: This is a 29 year old female who presented to the ER with complaints of a caustic ingestion.  The made a pot of coffee this morning and she did not know that there was residual cleaner in the coffee pot.  She drank half a cup of coffee and she started to have a burning sensation in her chest.  Additionally she had nausea and vomiting.  She has a history of depression, seizures, PTSD, and panic attacks.  Past Medical History  Diagnosis Date  . Seizures 2001  . Depression Dx 2000  . Panic attack Dx 2006  . [place]pertension Dx [address]371  . Heart murmur Dx 2015  . Arrhythmia Dx 2015  . PTSD (post-traumatic stress disorder) Dx 2013  . 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 Back pain   . Lumbar radiculopathy   . Vitamin D deficiency 01/24/2015  . Fibromyalgia     History reviewed. No pertinent past surgical history.  Family History  Problem Relation Age of Onset  . Heart attack Mother   . Heart disease Mother   . Sudden death Mother   . Sudden death Sister   . Heart attack Sister   . Sudden death Maternal Grandmother   . Fainting Maternal Grandmother     Social History:  reports that she quit smoking about 3 years ago. She has never used smokeless tobacco. She reports that she does not drink alcohol or use illicit drugs.  Allergies:  Allergies  Allergen Reactions  . Aspirin Hives    Medications: Scheduled: Continuous:  Results for orders placed or performed during the hospital encounter of 08/14/15 (from the past 24 hour(s))  Lipase, blood     Status: Abnormal   Collection Time: 08/14/15  1:49 PM  Result Value Ref Range   Lipase 15 (L) 22 - 51 U/L  Comprehensive metabolic panel     Status: Abnormal   Collection Time: 08/14/15  1:49 PM  Result Value Ref Range   Sodium 140 135 - 145 mmol/L   Potassium 3.3 (L) 3.5 - 5.1 mmol/L   Chloride 105 101 - 111 mmol/L   CO2 27 22 - 32 mmol/L    Glucose, Bld 70 65 - 99 mg/dL   BUN 15 6 - 20 mg/dL   Creatinine, Ser 0.58 0.44 - 1.00 mg/dL   Calcium 9.1 8.9 - 10.3 mg/dL   Total Protein 7.2 6.5 - 8.1 g/dL   Albumin 4.6 3.5 - 5.0 g/dL   AST 18 15 - 41 U/L   ALT 9 (L) 14 - 54 U/L   Alkaline Phosphatase 60 38 - 126 U/L   Total Bilirubin 0.9 0.3 - 1.2 mg/dL   GFR calc non Af Amer >60 >60 mL/min   GFR calc Af Amer >60 >60 mL/min   Anion gap 8 5 - 15  CBC     Status: Abnormal   Collection Time: 08/14/15  1:49 PM  Result Value Ref Range   WBC 5.6 4.0 - 10.5 K/uL   RBC 4.09 3.87 - 5.11 MIL/uL   Hemoglobin 12.6 12.0 - 15.0 g/dL   HCT 35.9 (L) 36.0 - 46.0 %   MCV 87.8 78.0 - 100.0 fL   MCH 30.8 26.0 - 34.0 pg   MCHC 35.1 30.0 - 36.0 g/dL   RDW 12.6 11.5 - 15.5 %   Platelets 209 150 - 400 K/uL  Urinalysis, Routine w reflex microscopic (  not at [facility])     Status: None   Collection Time: 08/14/15  1:52 PM  Result Value Ref Range   Color, Urine YELLOW YELLOW   APPearance CLEAR CLEAR   Specific Gravity, Urine 1.008 1.005 - 1.030   pH 7.0 5.0 - 8.0   Glucose, UA NEGATIVE NEGATIVE mg/dL   Hgb urine dipstick NEGATIVE NEGATIVE   Bilirubin Urine NEGATIVE NEGATIVE   Ketones, ur NEGATIVE NEGATIVE mg/dL   Protein, ur NEGATIVE NEGATIVE mg/dL   Urobilinogen, UA 0.2 0.0 - 1.0 mg/dL   Nitrite NEGATIVE NEGATIVE   Leukocytes, UA NEGATIVE NEGATIVE  POC urine preg, ED (not at [facility])     Status: None   Collection Time: 08/14/15  2:08 PM  Result Value Ref Range   Preg Test, Ur NEGATIVE NEGATIVE     No results found.  ROS:  As stated above in the HPI otherwise negative.  Blood pressure 142/82, pulse 68, temperature 97.9 F (36.6 C), temperature source Oral, resp. rate 22, last menstrual period 08/11/2015, SpO2 99 %.    PE: Gen: NAD, Alert and Oriented HEENT:  [place]/AT, EOMI Neck: Supple, no LAD Lungs: CTA Bilaterally CV: RRR without M/G/R ABM: Soft, NTND, +BS Ext: No C/C/E  Assessment/Plan: 1) Caustic ingestion. 2) Nausea and  vomiting.   With her current symptoms and the recent ingestion, I will perform an EGD to evaluate the mucosa.  I do not know if this was an acid or alkali ingestion.  Plan: 1) EGD now.  [name] D 08/14/2015, 4:54 PM

## 2015-08-14 NOTE — Discharge Instructions (Signed)
T Esophagogastroduodenoscopy Care After Refer to this sheet in the next few weeks. These instructions provide you with information on caring for yourself after your procedure. Your caregiver may also give you more specific instructions. Your treatment has been planned according to current medical practices, but problems sometimes occur. Call your caregiver if you have any problems or questions after your procedure.  HOME CARE INSTRUCTIONS  Do not eat or drink anything until the numbing medicine (local anesthetic) has worn off and your gag reflex has returned. You will know that the local anesthetic has worn off when you can swallow comfortably.  Do not drive for 12 hours after the procedure or as directed by your caregiver.  Only take medicines as directed by your caregiver. SEEK MEDICAL CARE IF:   You cannot stop coughing.  You are not urinating at all or less than usual. SEEK IMMEDIATE MEDICAL CARE IF:  You have difficulty swallowing.  You cannot eat or drink.  You have worsening throat or chest pain.  You have dizziness, lightheadedness, or you faint.  You have nausea or vomiting.  You have chills.  You have a fever.  You have severe abdominal pain.  You have black, tarry, or bloody stools. Document Released: 10/27/2012 Document Reviewed: 10/27/2012 [facility] Patient Information 2015 [place]. This information is not intended to replace advice given to you by your health care provider. Make sure you discuss any questions you have with your health care provider.

## 2015-08-14 NOTE — ED Notes (Signed)
Bed: [facility]ALD Expected date:  Expected time:  Means of arrival:  Comments: No bed  

## 2015-08-14 NOTE — ED Notes (Signed)
Poison control called charge, discussed pt, pt at work around [place] drank coffee from a pot that had either ivory soap or silver source pot cleaner. Pt has vomited since then. Estimated vomited x6.  Poison control recommends supportive care, PO challenge and if pt is still vomiting consult GI.   Pt reports she tried to drink some water 45 min ago and it came back up as "white bubbles". Reports abd and throat pain. Pain radiates to ears. Pain 6/10. Pt vomited last 30 min ago.

## 2015-08-15 NOTE — ED Notes (Signed)
Pt returned to ED asking for discharge paperwork.

## 2015-08-15 NOTE — Progress Notes (Signed)
Pt was seen at [facility] on 08/14/15 per [facility] who came to request to get a doctor note and dx for pt  Cm discussed with [facility] why she would not be able to get pt dx and further information on pt related to [employer] called Simon and CM explained to her why Carloyn Jaeger would not be given information with her dx, etc. Jomayra voiced understanding She was given options in order to get what she needs and she informed CM she would return to [facility] when she feels better to get information needed prior to returning to work  [place] explained she had EGD to evaluate the mucosa on 08/14/15 and forgot to get her d/c instructions and a dr note

## 2015-08-16 NOTE — Op Note (Signed)
[facility] [place], [address]   ENDOSCOPY PROCEDURE REPORT  PATIENT: [name], [name]  MR#: [id number] BIRTHDATE: 19-Jan-1986 , 29  yrs. old GENDER: female ENDOSCOPIST:Dalinda Heidt Benson Norway, MD REFERRED BY: PROCEDURE DATE:  08-20-15 PROCEDURE:   EGD, diagnostic ASA CLASS:    Class II INDICATIONS: Caustic ingestion MEDICATION: Fentanyl 75 mcg IV, Versed 5 mg IV, and Benadryl 25 mg IV TOPICAL ANESTHETIC:   Cetacaine Spray  DESCRIPTION OF PROCEDURE:   After the risks and benefits of the procedure were explained, informed consent was obtained.  The Pentax Gastroscope [id number]  endoscope was introduced through the mouth  and advanced to the second portion of the duodenum .  The instrument was slowly withdrawn as the mucosa was fully examined. Estimated blood loss is zero unless otherwise noted in this procedure report.   FINDINGS: Upon initial entry into the esophagus there was evidence of some mild mucosal sloughing.  No evidence of any inflammation, ulcerations, or erosions.  No evidence of any strictures and the Z-line was sharp.  The gastric antrum did exhibit a few small mucosal erosions, which were no specific and no biopsies were obtained.  The duodenum was normal.          The scope was then withdrawn from the patient and the procedure completed.  COMPLICATIONS: There were no immediate complications.  ENDOSCOPIC IMPRESSION: 1) Mild caustic esopahgeal irritation.  RECOMMENDATIONS: 1) Clear liquids and advance as tolerated. 2) Consider viscous lidocaine. _______________________________ eSignedCarol Ada, MD 08-20-2015 5:23 PM   cc: CPT CODES: ICD CODES:  The ICD and CPT codes recommended by this software are interpretations from the data that the clinical staff has captured with the software.  The verification of the translation of this report to the ICD and CPT codes and modifiers is the sole responsibility of the health care  institution and practicing physician where this report was generated.  [employer] will not be held responsible for the validity of the ICD and CPT codes included on this report.  AMA assumes no liability for data contained or not contained herein. CPT is a Designer, television/film set of the [employer].

## 2015-08-23 ENCOUNTER — Emergency Department (HOSPITAL_COMMUNITY): Payer: Medicaid Other

## 2015-08-23 ENCOUNTER — Encounter (HOSPITAL_COMMUNITY): Payer: Self-pay | Admitting: *Deleted

## 2015-08-23 ENCOUNTER — Emergency Department (HOSPITAL_COMMUNITY)
Admission: EM | Admit: 2015-08-23 | Discharge: 2015-08-23 | Disposition: A | Discharge status: Home / Self Care | Payer: Self-pay | Attending: Emergency Medicine | Admitting: Emergency Medicine

## 2015-08-23 ENCOUNTER — Emergency Department (HOSPITAL_COMMUNITY): Payer: Self-pay

## 2015-08-23 DIAGNOSIS — Z87891 Personal history of nicotine dependence: Secondary | ICD-10-CM | POA: Insufficient documentation

## 2015-08-23 DIAGNOSIS — R112 Nausea with vomiting, unspecified: Secondary | ICD-10-CM | POA: Insufficient documentation

## 2015-08-23 DIAGNOSIS — R42 Dizziness and giddiness: Secondary | ICD-10-CM | POA: Insufficient documentation

## 2015-08-23 DIAGNOSIS — Z79899 Other long term (current) drug therapy: Secondary | ICD-10-CM | POA: Insufficient documentation

## 2015-08-23 DIAGNOSIS — Z8639 Personal history of other endocrine, nutritional and metabolic disease: Secondary | ICD-10-CM | POA: Insufficient documentation

## 2015-08-23 DIAGNOSIS — R011 Cardiac murmur, unspecified: Secondary | ICD-10-CM | POA: Insufficient documentation

## 2015-08-23 DIAGNOSIS — R079 Chest pain, unspecified: Secondary | ICD-10-CM | POA: Insufficient documentation

## 2015-08-23 DIAGNOSIS — Z8739 Personal history of other diseases of the musculoskeletal system and connective tissue: Secondary | ICD-10-CM | POA: Insufficient documentation

## 2015-08-23 DIAGNOSIS — Z7951 Long term (current) use of inhaled steroids: Secondary | ICD-10-CM | POA: Insufficient documentation

## 2015-08-23 DIAGNOSIS — Z3202 Encounter for pregnancy test, result negative: Secondary | ICD-10-CM | POA: Insufficient documentation

## 2015-08-23 LAB — CBC WITH DIFFERENTIAL/PLATELET
Basophils Absolute: 0 10*3/uL (ref 0.0–0.1)
Basophils Relative: 0 %
Eosinophils Absolute: 0 10*3/uL (ref 0.0–0.7)
Eosinophils Relative: 0 %
HCT: 33.7 % — ABNORMAL LOW (ref 36.0–46.0)
Hemoglobin: 11.8 g/dL — ABNORMAL LOW (ref 12.0–15.0)
Lymphocytes Relative: 32 %
Lymphs Abs: 2.2 10*3/uL (ref 0.7–4.0)
MCH: 30.7 pg (ref 26.0–34.0)
MCHC: 35 g/dL (ref 30.0–36.0)
MCV: 87.8 fL (ref 78.0–100.0)
Monocytes Absolute: 0.4 10*3/uL (ref 0.1–1.0)
Monocytes Relative: 5 %
Neutro Abs: 4.4 10*3/uL (ref 1.7–7.7)
Neutrophils Relative %: 63 %
Platelets: 200 10*3/uL (ref 150–400)
RBC: 3.84 MIL/uL — ABNORMAL LOW (ref 3.87–5.11)
RDW: 12.9 % (ref 11.5–15.5)
WBC: 7 10*3/uL (ref 4.0–10.5)

## 2015-08-23 LAB — COMPREHENSIVE METABOLIC PANEL
ALT: 9 U/L — ABNORMAL LOW (ref 14–54)
AST: 16 U/L (ref 15–41)
Albumin: 4.4 g/dL (ref 3.5–5.0)
Alkaline Phosphatase: 56 U/L (ref 38–126)
Anion gap: 5 (ref 5–15)
BUN: 19 mg/dL (ref 6–20)
CO2: 26 mmol/L (ref 22–32)
Calcium: 9.1 mg/dL (ref 8.9–10.3)
Chloride: 109 mmol/L (ref 101–111)
Creatinine, Ser: 0.64 mg/dL (ref 0.44–1.00)
GFR calc Af Amer: >60 mL/min (ref >60)
GFR calc non Af Amer: >60 mL/min (ref >60)
Glucose, Bld: 82 mg/dL (ref 65–99)
Potassium: 3.7 mmol/L (ref 3.5–5.1)
Sodium: 140 mmol/L (ref 135–145)
Total Bilirubin: 0.7 mg/dL (ref 0.3–1.2)
Total Protein: 7 g/dL (ref 6.5–8.1)

## 2015-08-23 LAB — I-STAT TROPONIN, ED
Troponin i, poc: 0 ng/mL (ref 0.00–0.08)
Troponin i, poc: 0 ng/mL (ref 0.00–0.08)

## 2015-08-23 LAB — I-STAT BETA HCG BLOOD, ED (MC, WL, AP ONLY): I-stat hCG, quantitative: <5 m[IU]/mL (ref <5)

## 2015-08-23 MED ORDER — IOHEXOL 350 MG/ML SOLN
100.0000 mL | Freq: Once | INTRAVENOUS | Status: AC | PRN
  Administered 2015-08-23: 100 mL via INTRAVENOUS

## 2015-08-23 MED ORDER — SODIUM CHLORIDE 0.9 % IV BOLUS (SEPSIS)
1000.0000 mL | Freq: Once | INTRAVENOUS | Status: AC
  Administered 2015-08-23: 1000 mL via INTRAVENOUS

## 2015-08-23 NOTE — Discharge Instructions (Signed)

## 2015-08-23 NOTE — ED Notes (Signed)
Patient transported to CT 

## 2015-08-23 NOTE — ED Provider Notes (Signed)
CSN: [id number]     Arrival date & time 08/23/15  9449 History   First MD Initiated Contact with Patient 08/23/15 [phone]     Chief Complaint  Patient presents with  . Anxiety  . Chest Pain     (Consider location/radiation/quality/duration/timing/severity/associated sxs/prior Treatment) Patient is a 29 y.o. female presenting with anxiety and chest pain.  Anxiety Associated symptoms include chest pain and shortness of breath. Pertinent negatives include no abdominal pain and no headaches.  Chest Pain Pain location:  Substernal area Pain quality: pressure and sharp   Pain radiates to:  Epigastrium Pain radiates to the back: no   Pain severity:  Severe Onset quality:  Sudden Duration:  1 hour Timing:  Constant Progression:  Unchanged Chronicity:  New Context comment:  Has had pain like this before, has seen cardiologist , diagnosed with SVT, POTS, Associated symptoms: anxiety, diaphoresis, nausea, palpitations (racing then would slow down), shortness of breath and vomiting   Associated symptoms: no abdominal pain, no back pain, no cough, no fever, no headache and no syncope   Associated symptoms comment:  Odynophagia Spitting up small flecks of blood Had mild caustic ingestion 9/20 accidentally drank cleaner, eval by GI Risk factors: coronary artery disease (sister, mom CAD younger than 25), high cholesterol and hypertension   Risk factors: no diabetes mellitus, no immobilization, no prior DVT/PE and no smoking     Past Medical History  Diagnosis Date  . Seizures 2001  . Depression Dx 2000  . Panic attack Dx 2006  . Hypertension Dx [address]  . Heart murmur Dx 2015  . Arrhythmia Dx 2015  . PTSD (post-traumatic stress disorder) Dx 2013  . 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 Back pain   . Lumbar radiculopathy   . Vitamin D deficiency 01/24/2015  . Fibromyalgia    Past Surgical History  Procedure Laterality Date  . Esophagogastroduodenoscopy N/A 08/14/2015   Procedure: ESOPHAGOGASTRODUODENOSCOPY (EGD);  Surgeon: Carol Ada, MD;  Location: [facility];  Service: Endoscopy;  Laterality: N/A;   Family History  Problem Relation Age of Onset  . Heart attack Mother   . Heart disease Mother   . Sudden death Mother   . Sudden death Sister   . Heart attack Sister   . Sudden death Maternal Grandmother   . Fainting Maternal Grandmother    Social History  Substance Use Topics  . Smoking status: Former Smoker    Quit date: 06/23/2012  . Smokeless tobacco: Never Used  . Alcohol Use: No   OB History    No data available     Review of Systems  Constitutional: Positive for diaphoresis. Negative for fever.  HENT: Negative for sore throat.   Eyes: Negative for visual disturbance.  Respiratory: Positive for shortness of breath. Negative for cough.   Cardiovascular: Positive for chest pain and palpitations (racing then would slow down). Negative for syncope.  Gastrointestinal: Positive for nausea and vomiting. Negative for abdominal pain.  Genitourinary: Negative for difficulty urinating.  Musculoskeletal: Negative for back pain and neck pain.  Skin: Negative for rash.  Neurological: Positive for light-headedness. Negative for syncope and headaches.      Allergies  Aspirin  Home Medications   Prior to Admission medications   Medication Sig Start Date End Date Taking? Authorizing Provider  docusate sodium (COLACE) 100 MG capsule Take 1 capsule (100 mg total) by mouth every 12 (twelve) hours. Patient taking differently: Take 100 mg by mouth 2 (two) times daily as  needed for mild constipation.  01/30/15  Yes Audelia Hives Presson, PA  DULoxetine (CYMBALTA) 20 MG capsule Take 1 capsule (20 mg total) by mouth daily. 03/13/15  Yes Josalyn Funches, MD  fluticasone (FLONASE) 50 MCG/ACT nasal spray Place 2 sprays into both nostrils daily. Patient taking differently: Place 2 sprays into both nostrils 2 (two) times daily as needed for allergies.   03/13/15  Yes Josalyn Funches, MD  gabapentin (NEURONTIN) 300 MG capsule Take 300 mg by mouth 3 (three) times daily.    Yes Historical Provider, MD  methocarbamol (ROBAXIN) 500 MG tablet Take 500 mg by mouth every 6 (six) hours as needed for muscle spasms.   Yes Historical Provider, MD  naproxen (NAPROSYN) 500 MG tablet Take 1 tablet (500 mg total) by mouth 2 (two) times daily as needed for mild pain, moderate pain or headache (TAKE WITH MEALS.). 11/12/14  Yes Mercedes Camprubi-Soms, PA-C  omeprazole (PRILOSEC) 40 MG capsule Take 1 capsule (40 mg total) by mouth daily. 03/13/15  Yes Josalyn Funches, MD  polyethylene glycol (MIRALAX / GLYCOLAX) packet Take 17 g by mouth daily. Mix into 8 ounces of water or juice and drink once daily x 7 days Patient taking differently: Take 17 g by mouth daily as needed for mild constipation. Mix into 8 ounces of water or juice and drink once daily x 7 days 01/30/15  Yes Lutricia Feil, PA  pregabalin (LYRICA) 75 MG capsule Take 1 capsule (75 mg total) by mouth 3 (three) times daily. 12/22/14  Yes Melvenia Beam, MD  promethazine (PHENERGAN) 25 MG tablet Take 25 mg by mouth every 6 (six) hours as needed for nausea or vomiting.   Yes Historical Provider, MD  propranolol ER (INDERAL LA) 80 MG 24 hr capsule Take 1 capsule (80 mg total) by mouth daily. 08/16/14  Yes Deboraha Sprang, MD   BP 122/79 mmHg  Pulse 83  Temp(Src) 98.2 F (36.8 C) (Oral)  Resp 16  SpO2 100%  LMP 08/11/2015 (Exact Date) Physical Exam  Constitutional: She is oriented to person, place, and time. She appears well-developed and well-nourished. No distress.  HENT:  Head: Normocephalic and atraumatic.  Eyes: Conjunctivae and EOM are normal.  Neck: Normal range of motion.  Cardiovascular: Normal rate, regular rhythm, normal heart sounds and intact distal pulses.  Exam reveals no gallop and no friction rub.   No murmur heard. Pulmonary/Chest: Effort normal and breath sounds normal. No  respiratory distress. She has no wheezes. She has no rales.  Abdominal: Soft. She exhibits no distension. There is no tenderness. There is no guarding.  Musculoskeletal: She exhibits no edema or tenderness.  Neurological: She is alert and oriented to person, place, and time.  Skin: Skin is warm and dry. No rash noted. She is not diaphoretic. No erythema.  Nursing note and vitals reviewed.   ED Course  Procedures (including critical care time) Labs Review Labs Reviewed  CBC WITH DIFFERENTIAL/PLATELET - Abnormal; Notable for the following:    RBC 3.84 (*)    Hemoglobin 11.8 (*)    HCT 33.7 (*)    All other components within normal limits  COMPREHENSIVE METABOLIC PANEL - Abnormal; Notable for the following:    ALT 9 (*)    All other components within normal limits  I-STAT TROPOININ, ED  I-STAT BETA HCG BLOOD, ED ([facility], [facility], [facility] ONLY)  I-STAT TROPOININ, ED  Randolm Idol, ED    Imaging Review Dg Chest 2 View  08/23/2015  CLINICAL DATA:  LEFT mid chest pain. Short of breath worsening over the last few hours. Episodes of emesis. Previous smoker.  EXAM: CHEST  2 VIEW  COMPARISON:  04/05/2015.  FINDINGS: Cardiopericardial silhouette within normal limits. Mediastinal contours normal. Trachea midline. No airspace disease or effusion. Monitoring leads project over the chest.  IMPRESSION: No active cardiopulmonary disease.   Electronically Signed   By: Dereck Ligas M.D.   On: 08/23/2015 10:27   Ct Angio Chest Pe W/cm &/or Wo Cm  08/23/2015   CLINICAL DATA:  Left chest pain, shortness of breath since March  EXAM: CT ANGIOGRAPHY CHEST WITH CONTRAST  TECHNIQUE: Multidetector CT imaging of the chest was performed using the standard protocol during bolus administration of intravenous contrast. Multiplanar CT image reconstructions and MIPs were obtained to evaluate the vascular anatomy.  CONTRAST:  186mL OMNIPAQUE IOHEXOL 350 MG/ML SOLN  COMPARISON:  06/11/2014  FINDINGS: Right arm IV contrast  injection. SVC patent. Heart size normal. RV nondilated. Satisfactory opacification of pulmonary arteries noted, and there is no evidence of pulmonary emboli. Patent pulmonary veins bilaterally. Adequate contrast opacification of the thoracic aorta with no evidence of dissection, aneurysm, or stenosis. There is classic 3-vessel brachiocephalic arch anatomy without proximal stenosis. No pneumothorax.  No pleural or pericardial effusion. No hilar or mediastinal adenopathy. Minimal dependent subsegmental atelectasis posteriorly in both lower lobes. Lungs are clear. Thoracic spine and sternum intact. Visualized portions of upper abdomen unremarkable.  Review of the MIP images confirms the above findings.  IMPRESSION: Negative.   Electronically Signed   By: Lucrezia Europe M.D.   On: 08/23/2015 11:47   I have personally reviewed and evaluated these images and lab results as part of my medical decision-making.   EKG Interpretation   Date/Time:  Thursday August 23 2015 09:55:06 EDT Ventricular Rate:  69 PR Interval:  217 QRS Duration: 70 QT Interval:  386 QTC Calculation: 413 R Axis:   83 Text Interpretation:  Sinus rhythm Prolonged PR interval RSR' in V1 or V2,  probably normal variant Baseline wander in lead(s) V5 Minimal PR  depression No significant change since last tracing Confirmed by  [facility] MD, ERIN ([id number]) on 08/23/2015 5:17:37 PM      MDM   Final diagnoses:  Chest pain, unspecified chest pain type   29 year old female with a history of POTS, possible SVT, depression, family history of early heart disease, recent evaluation in the emergency department for caustic ingestion on September 20 (mild findings on EGD), presents with concern for chest pain, shortness of breath, palpitations and lightheadedness.  EKG was evaluated by me and showed no acute ST changes. Minimal PR depression, however doubt pericarditis by history, no significant pr depression, STE or signs of effusion on CT. Low  suspicion for ACS and delta troponins evaluated and negative.  HEAR score 2.  Given recent caustic ingestion with chest pain, will order CT scan to evaluate for esophageal perforation or PE.    CT PE study within normal limits.  Possible POTS episode by history, history of similar. Recommend follow up with PCP/Cardiology. Patient discharged in stable condition with understanding of reasons to return.    Gareth Morgan, MD 08/23/15 1718

## 2015-08-23 NOTE — ED Notes (Signed)
From work for anxiety and chest pain. Pt states has history of "musculoskeletal chest pain." No radiation of pain. + Tenderness and exacerbation with ROM of UEs. Hx of afib with RVR as well.   NSR BP 120/80 HR 76

## 2015-08-23 NOTE — ED Notes (Signed)
Bed: SU11 Expected date:  Expected time:  Means of arrival:  Comments: EMS chest pain.

## 2015-08-29 ENCOUNTER — Encounter: Payer: Self-pay | Admitting: Internal Medicine

## 2015-08-29 ENCOUNTER — Encounter: Payer: Self-pay | Admitting: *Deleted

## 2015-08-29 ENCOUNTER — Ambulatory Visit (INDEPENDENT_AMBULATORY_CARE_PROVIDER_SITE_OTHER): Payer: Self-pay | Admitting: Internal Medicine

## 2015-08-29 VITALS — BP 132/90 | HR 73 | Ht 70.0 in | Wt 138.0 lb

## 2015-08-29 DIAGNOSIS — I10 Essential (primary) hypertension: Secondary | ICD-10-CM

## 2015-08-29 DIAGNOSIS — R0789 Other chest pain: Secondary | ICD-10-CM

## 2015-08-29 MED ORDER — PROPRANOLOL HCL ER 120 MG PO CP24: 120.0000 mg | ORAL_CAPSULE | Freq: Every day | ORAL | Status: DC

## 2015-08-29 NOTE — Patient Instructions (Addendum)
Medication Instructions: 1) Start Inderal LA (propanolol) 120 mg one tablet by mouth once daily  Labwork: - none  Procedures/Testing: - none  Follow-Up: - Your physician recommends that you schedule a follow-up appointment in: 3 months with Dr. Caryl Comes.  Any Additional Special Instructions Will Be Listed Below (If Applicable). - none

## 2015-08-29 NOTE — Progress Notes (Signed)
Patient Care Team: Boykin Nearing, MD as PCP - General (Family Medicine)   HPI  [name] is a 29 y.o. female Seen in followup for palpitations that I thought was possibly dysautonomic;  that report confirmed by the event recorder to which demonstrated recurrent sinus tachycardia. She also been noted to have right bundle branch block/RAD ventricular ectopy  She struggles with PTSD and anxiety. 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 She is raising her 2 sons of her older sister.  There was a family of "cardiac arrest", however, these occurred in the context of cancer and dialysis.  She continues to significant problems with pain and fatigue. She also has intermittent tachycardia palpitations with exertion.  She has not pursued counseling  With her elevated blood pressure and sinus tach at her last visit we started on betablockers  He has stopped  She has had syncope on two occassions  More recently she had stopped her BB  She was taken by EMS who picked her up from work and took to [employer] and she says she was not seen  careeverywhere identficied no records there  Records from [place] showed normal  Vs  We have just collected and the information from [facility]. Vital signs on arrival demonstrated a blood pressure 150/96 with a pulse of 80. This is in stark contrast was taken to the patient's saying that her heart rate was over 250. In addition the EMS narrative does  not describe attendance for syncope but rather for chest pain.  Past Medical History  Diagnosis Date  . Seizures ([place]) 2001  . Depression Dx 2000  . Panic attack Dx 2006  . Hypertension Dx [address]  . Heart murmur Dx 2015  . Arrhythmia Dx 2015  . PTSD (post-traumatic stress disorder) Dx 2013  . 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 Back pain   . Lumbar radiculopathy   . Vitamin D deficiency 01/24/2015  . Fibromyalgia     Past Surgical History  Procedure Laterality Date  .  Esophagogastroduodenoscopy N/A 08/14/2015    Procedure: ESOPHAGOGASTRODUODENOSCOPY (EGD);  Surgeon: Carol Ada, MD;  Location: [facility];  Service: Endoscopy;  Laterality: N/A;    Current Outpatient Prescriptions  Medication Sig Dispense Refill  . docusate sodium (COLACE) 100 MG capsule Take 1 capsule (100 mg total) by mouth every 12 (twelve) hours. (Patient taking differently: Take 100 mg by mouth 2 (two) times daily as needed for mild constipation. ) 60 capsule 0  . DULoxetine (CYMBALTA) 20 MG capsule Take 1 capsule (20 mg total) by mouth daily. 30 capsule 2  . fluticasone (FLONASE) 50 MCG/ACT nasal spray Place 2 sprays into both nostrils daily. (Patient taking differently: Place 2 sprays into both nostrils 2 (two) times daily as needed for allergies. ) 16 g 6  . gabapentin (NEURONTIN) 300 MG capsule Take 300 mg by mouth 3 (three) times daily.     Marland Kitchen HYDROcodone-acetaminophen (NORCO/VICODIN) 5-325 MG tablet Take 1 tablet by mouth every 6 (six) hours as needed (For pain).   0  . methocarbamol (ROBAXIN) 500 MG tablet Take 500 mg by mouth every 6 (six) hours as needed for muscle spasms.    . metoprolol succinate (TOPROL-XL) 25 MG 24 hr tablet Take 25 mg by mouth daily.    . naproxen (NAPROSYN) 500 MG tablet Take 1 tablet (500 mg total) by mouth 2 (two) times daily as needed for mild pain, moderate pain or headache (  TAKE WITH MEALS.). 20 tablet 0  . omeprazole (PRILOSEC) 40 MG capsule Take 1 capsule (40 mg total) by mouth daily. 30 capsule 3  . polyethylene glycol (MIRALAX / GLYCOLAX) packet Take 17 g by mouth daily. Mix into 8 ounces of water or juice and drink once daily x 7 days (Patient taking differently: Take 17 g by mouth daily as needed for mild constipation. Mix into 8 ounces of water or juice and drink once daily x 7 days) 14 each 0  . pregabalin (LYRICA) 75 MG capsule Take 1 capsule (75 mg total) by mouth 3 (three) times daily. 90 capsule 6  . promethazine (PHENERGAN) 25 MG tablet Take  25 mg by mouth every 6 (six) hours as needed for nausea or vomiting.    . propranolol ER (INDERAL LA) 80 MG 24 hr capsule Take 1 capsule (80 mg total) by mouth daily. 30 capsule 4   No current facility-administered medications for this visit.    Allergies  Allergen Reactions  . Aspirin Hives    Review of Systems negative except from HPI and PMH  Physical Exam BP 132/90 mmHg  Pulse 73  Ht 5\' 10"  (1.778 m)  Wt 138 lb (62.596 kg)  BMI 19.80 kg/m2  LMP 08/11/2015 (Exact Date) Well developed and nourished in no acute distress HENT normal Neck supple with JVP-flat Clear Regular rate and rhythm, no murmurs or gallops Abd-soft with active BS No Clubbing cyanosis edema Skin-warm and dry A & Oriented  Grossly normal sensory and motor function Marfanoid habitus with arachnodactyly     Assessment and  Plan  Ventricular ectopy-right bundle branch/RAD  Dysautonomia/orthostatic intolerance   Chest pain-atypical   Anxiety/PTSD  Elevated Blood pressure    her recurrent syncope occcurred having stopped BB  Will resume  BP elevated  As above  Her storyis unfortunately very discordant from the narrative from EMS. It makes it hard to understand what the problems are how it is we might be able to help.  We spent more than 50% of our >25 min visit in face to face counseling regarding the above

## 2015-10-28 ENCOUNTER — Emergency Department (HOSPITAL_COMMUNITY)
Admission: EM | Admit: 2015-10-28 | Discharge: 2015-10-28 | Disposition: A | Discharge status: Home / Self Care | Payer: Medicaid Other | Attending: Emergency Medicine | Admitting: Emergency Medicine

## 2015-10-28 ENCOUNTER — Encounter (HOSPITAL_COMMUNITY): Payer: Self-pay

## 2015-10-28 ENCOUNTER — Emergency Department (HOSPITAL_COMMUNITY): Payer: Medicaid Other

## 2015-10-28 DIAGNOSIS — Z7951 Long term (current) use of inhaled steroids: Secondary | ICD-10-CM | POA: Insufficient documentation

## 2015-10-28 DIAGNOSIS — R1110 Vomiting, unspecified: Secondary | ICD-10-CM | POA: Insufficient documentation

## 2015-10-28 DIAGNOSIS — F329 Major depressive disorder, single episode, unspecified: Secondary | ICD-10-CM | POA: Insufficient documentation

## 2015-10-28 DIAGNOSIS — R011 Cardiac murmur, unspecified: Secondary | ICD-10-CM | POA: Insufficient documentation

## 2015-10-28 DIAGNOSIS — Z79899 Other long term (current) drug therapy: Secondary | ICD-10-CM | POA: Insufficient documentation

## 2015-10-28 DIAGNOSIS — I10 Essential (primary) hypertension: Secondary | ICD-10-CM | POA: Insufficient documentation

## 2015-10-28 DIAGNOSIS — Z87891 Personal history of nicotine dependence: Secondary | ICD-10-CM | POA: Insufficient documentation

## 2015-10-28 DIAGNOSIS — I499 Cardiac arrhythmia, unspecified: Secondary | ICD-10-CM | POA: Insufficient documentation

## 2015-10-28 DIAGNOSIS — F410 Panic disorder [episodic paroxysmal anxiety] without agoraphobia: Secondary | ICD-10-CM | POA: Insufficient documentation

## 2015-10-28 DIAGNOSIS — F419 Anxiety disorder, unspecified: Secondary | ICD-10-CM | POA: Insufficient documentation

## 2015-10-28 DIAGNOSIS — Z8639 Personal history of other endocrine, nutritional and metabolic disease: Secondary | ICD-10-CM | POA: Insufficient documentation

## 2015-10-28 DIAGNOSIS — Z8739 Personal history of other diseases of the musculoskeletal system and connective tissue: Secondary | ICD-10-CM | POA: Insufficient documentation

## 2015-10-28 DIAGNOSIS — Z862 Personal history of diseases of the blood and blood-forming organs and certain disorders involving the immune mechanism: Secondary | ICD-10-CM | POA: Insufficient documentation

## 2015-10-28 DIAGNOSIS — R0789 Other chest pain: Secondary | ICD-10-CM | POA: Insufficient documentation

## 2015-10-28 LAB — CBC
HCT: 37.9 % (ref 36.0–46.0)
Hemoglobin: 13.2 g/dL (ref 12.0–15.0)
MCH: 30.8 pg (ref 26.0–34.0)
MCHC: 34.8 g/dL (ref 30.0–36.0)
MCV: 88.6 fL (ref 78.0–100.0)
Platelets: 241 K/uL (ref 150–400)
RBC: 4.28 MIL/uL (ref 3.87–5.11)
RDW: 12.1 % (ref 11.5–15.5)
WBC: 4.6 K/uL (ref 4.0–10.5)

## 2015-10-28 LAB — I-STAT TROPONIN, ED: Troponin i, poc: 0 ng/mL (ref 0.00–0.08)

## 2015-10-28 LAB — BASIC METABOLIC PANEL WITH GFR
Anion gap: 7 (ref 5–15)
BUN: 22 mg/dL — ABNORMAL HIGH (ref 6–20)
CO2: 26 mmol/L (ref 22–32)
Calcium: 9.5 mg/dL (ref 8.9–10.3)
Chloride: 108 mmol/L (ref 101–111)
Creatinine, Ser: 0.71 mg/dL (ref 0.44–1.00)
GFR calc Af Amer: >60 mL/min (ref >60)
GFR calc non Af Amer: >60 mL/min (ref >60)
Glucose, Bld: 87 mg/dL (ref 65–99)
Potassium: 3.7 mmol/L (ref 3.5–5.1)
Sodium: 141 mmol/L (ref 135–145)

## 2015-10-28 LAB — SEDIMENTATION RATE: Sed Rate: 2 mm/h (ref 0–22)

## 2015-10-28 MED ORDER — GI COCKTAIL ~~LOC~~: 30.0000 mL | Freq: Two times a day (BID) | ORAL | Status: DC | PRN

## 2015-10-28 MED ORDER — GI COCKTAIL ~~LOC~~
30.0000 mL | Freq: Once | ORAL | Status: AC
  Administered 2015-10-28: 30 mL via ORAL
  Filled 2015-10-28: qty 30

## 2015-10-28 MED ORDER — LORAZEPAM 2 MG/ML IJ SOLN
1.0000 mg | Freq: Once | INTRAMUSCULAR | Status: AC
  Administered 2015-10-28: 1 mg via INTRAVENOUS

## 2015-10-28 MED ORDER — LORAZEPAM 2 MG/ML IJ SOLN
1.0000 mg | Freq: Once | INTRAMUSCULAR | Status: DC
  Filled 2015-10-28: qty 1

## 2015-10-28 MED ORDER — ONDANSETRON HCL 4 MG/2ML IJ SOLN
4.0000 mg | Freq: Once | INTRAMUSCULAR | Status: AC
  Administered 2015-10-28: 4 mg via INTRAVENOUS
  Filled 2015-10-28: qty 2

## 2015-10-28 MED ORDER — ONDANSETRON 4 MG PO TBDP: 4.0000 mg | ORAL_TABLET | Freq: Three times a day (TID) | ORAL | Status: DC | PRN

## 2015-10-28 MED ORDER — KETOROLAC TROMETHAMINE 30 MG/ML IJ SOLN
30.0000 mg | Freq: Once | INTRAMUSCULAR | Status: DC
  Filled 2015-10-28: qty 1

## 2015-10-28 MED ORDER — KETOROLAC TROMETHAMINE 15 MG/ML IJ SOLN
30.0000 mg | Freq: Once | INTRAMUSCULAR | Status: AC
  Administered 2015-10-28: 30 mg via INTRAVENOUS

## 2015-10-28 MED ORDER — HYDROMORPHONE HCL 1 MG/ML IJ SOLN
1.0000 mg | Freq: Once | INTRAMUSCULAR | Status: AC
  Administered 2015-10-28: 1 mg via INTRAVENOUS
  Filled 2015-10-28: qty 1

## 2015-10-28 MED ORDER — MORPHINE SULFATE (PF) 4 MG/ML IV SOLN: 4.0000 mg | Freq: Once | INTRAVENOUS | Status: DC

## 2015-10-28 MED ORDER — OMEPRAZOLE 20 MG PO CPDR: 20.0000 mg | DELAYED_RELEASE_CAPSULE | Freq: Every day | ORAL | Status: DC

## 2015-10-28 NOTE — ED Provider Notes (Signed)
CSN: [id number]     Arrival date & time 10/28/15  1219 History   First MD Initiated Contact with Patient 10/28/15 1241     Chief Complaint  Patient presents with  . Chest Pain  . Emesis    HPI   [name] is an 29 y.o. female with history of HTN, palpitations, anxiety, PTSD, who presents to the ED for evaluation of chest pain. She states the pain started on 11/29 while she was sitting in class. She describes the pain as sharp and constant in her left chest underneath her breast. She states that the pain was associated with palpitations and a burning feeling in her throat. States that she thought the pain was heartburn so she tried OTC antacids with no relief. States that over the next few days the pain would wax and wane. The pain then began to radiate into her arm and her left jaw. She states she came to the ER today because now the pain is associated with nausea and vomiting. She reports the pain is worse with palpation and deep breaths. The pain is worse laying back and improved with sitting up. Denies diarrhea. Denies abdominal pain. Denies fever, chills, SOB.  Of note, pt has had many ER visits for similar complaints. She states that this presentation feels different than other episodes of chest pain. She states this feels more like burning. Pt does follow with cardiology (Dr. Caryl Comes) due to these episodes and has been diagnosed with POTS and SVT. She had an unremarkable echo in 2015 and event recorder revealed only sinus tach. She was recently seen in cards clinic with unremarkable workup and her beta blocker dose was increased.   Past Medical History  Diagnosis Date  . Seizures ([place]) 2001  . Depression Dx 2000  . Panic attack Dx 2006  . Hypertension Dx [address]  . Heart murmur Dx 2015  . Arrhythmia Dx 2015  . PTSD (post-traumatic stress disorder) Dx 2013  . 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 Back pain   . Lumbar radiculopathy   . Vitamin D deficiency 01/24/2015  .  Fibromyalgia    Past Surgical History  Procedure Laterality Date  . Esophagogastroduodenoscopy N/A 08/14/2015    Procedure: ESOPHAGOGASTRODUODENOSCOPY (EGD);  Surgeon: Carol Ada, MD;  Location: [facility];  Service: Endoscopy;  Laterality: N/A;   Family History  Problem Relation Age of Onset  . Heart attack Mother   . Heart disease Mother   . Sudden death Mother   . Sudden death Sister   . Heart attack Sister   . Sudden death Maternal Grandmother   . Fainting Maternal Grandmother    Social History  Substance Use Topics  . Smoking status: Former Smoker    Quit date: 06/23/2012  . Smokeless tobacco: Never Used  . Alcohol Use: Yes   OB History    No data available     Review of Systems  All other systems reviewed and are negative.     Allergies  Aspirin  Home Medications   Prior to Admission medications   Medication Sig Start Date End Date Taking? Authorizing Provider  docusate sodium (COLACE) 100 MG capsule Take 1 capsule (100 mg total) by mouth every 12 (twelve) hours. Patient taking differently: Take 100 mg by mouth 2 (two) times daily as needed for mild constipation.  01/30/15   Audelia Hives Presson, PA  DULoxetine (CYMBALTA) 20 MG capsule Take 1 capsule (20 mg total) by mouth daily. 03/13/15  Boykin Nearing, MD  fluticasone (FLONASE) 50 MCG/ACT nasal spray Place 2 sprays into both nostrils daily. Patient taking differently: Place 2 sprays into both nostrils 2 (two) times daily as needed for allergies.  03/13/15   Josalyn Funches, MD  gabapentin (NEURONTIN) 300 MG capsule Take 300 mg by mouth 3 (three) times daily.     Historical Provider, MD  HYDROcodone-acetaminophen (NORCO/VICODIN) 5-325 MG tablet Take 1 tablet by mouth every 6 (six) hours as needed (For pain).  07/08/15   Historical Provider, MD  methocarbamol (ROBAXIN) 500 MG tablet Take 500 mg by mouth every 6 (six) hours as needed for muscle spasms.    Historical Provider, MD  metoprolol succinate  (TOPROL-XL) 25 MG 24 hr tablet Take 25 mg by mouth daily.    Historical Provider, MD  naproxen (NAPROSYN) 500 MG tablet Take 1 tablet (500 mg total) by mouth 2 (two) times daily as needed for mild pain, moderate pain or headache (TAKE WITH MEALS.). 11/12/14   Mercedes Camprubi-Soms, PA-C  omeprazole (PRILOSEC) 40 MG capsule Take 1 capsule (40 mg total) by mouth daily. 03/13/15   Josalyn Funches, MD  polyethylene glycol (MIRALAX / GLYCOLAX) packet Take 17 g by mouth daily. Mix into 8 ounces of water or juice and drink once daily x 7 days Patient taking differently: Take 17 g by mouth daily as needed for mild constipation. Mix into 8 ounces of water or juice and drink once daily x 7 days 01/30/15   Lutricia Feil, PA  pregabalin (LYRICA) 75 MG capsule Take 1 capsule (75 mg total) by mouth 3 (three) times daily. 12/22/14   Melvenia Beam, MD  promethazine (PHENERGAN) 25 MG tablet Take 25 mg by mouth every 6 (six) hours as needed for nausea or vomiting.    Historical Provider, MD  propranolol ER (INDERAL LA) 120 MG 24 hr capsule Take 1 capsule (120 mg total) by mouth daily. 08/29/15   Deboraha Sprang, MD   BP 134/90 mmHg  Pulse 88  Temp(Src) 97.8 F (36.6 C) (Oral)  Resp 25  SpO2 100%  LMP 10/25/2015 Physical Exam  Constitutional: She is oriented to person, place, and time.  Pt is sitting up in bed, writhing   HENT:  Right Ear: External ear normal.  Left Ear: External ear normal.  Nose: Nose normal.  Mouth/Throat: Oropharynx is clear and moist.  Eyes: Conjunctivae and EOM are normal. Pupils are equal, round, and reactive to light.  Neck: Normal range of motion. Neck supple.  Cardiovascular: Normal rate, regular rhythm and normal heart sounds.   Pulmonary/Chest: Effort normal and breath sounds normal. No respiratory distress. She has no wheezes.    Abdominal: Soft. Bowel sounds are normal. She exhibits no distension. There is no tenderness. There is no rebound and no guarding.    Musculoskeletal: Normal range of motion. She exhibits no edema.  Neurological: She is alert and oriented to person, place, and time. No cranial nerve deficit. Coordination normal.  Skin: Skin is warm and dry.  Psychiatric: Her mood appears anxious.  Nursing note and vitals reviewed.   ED Course  Procedures (including critical care time) Labs Review Labs Reviewed  BASIC METABOLIC PANEL - Abnormal; Notable for the following:    BUN 22 (*)    All other components within normal limits  CBC  SEDIMENTATION RATE  I-STAT TROPOININ, ED    Imaging Review Dg Chest 2 View  10/28/2015  CLINICAL DATA:  Intermittent central chest pressure radiating into neck, emesis,  shortness of breath, lightheadedness. Former smoker. EXAM: CHEST  2 VIEW COMPARISON:  Chest x-rays dated 08/23/2015 and 02/04/2014. Comparison also made to a chest CT dated 08/23/2015. FINDINGS: Heart size is normal. Overall cardiomediastinal silhouette remains normal in size and configuration. On the AP projection, subtle opacities at each lung base are believed to be artifactual related to overlying soft tissues, possibly due to poor inspiration with crowding of the bibasilar bronchovascular markings. There is no consolidation or correlating opacity on the lateral projection. No pleural effusion seen. No pneumothorax seen. Osseous and soft tissue structures about the chest are unremarkable. IMPRESSION: Questionable subtle opacities at each lung base on the AP projection, favored to be artifactual given the normal appearance on the lateral projection. Alternatively, this may represent mild edema. Early pneumonia unlikely in the absence of fever and a normal lateral view. Otherwise normal exam, as detailed above. Electronically Signed   By: Franki Cabot M.D.   On: 10/28/2015 13:49   I have personally reviewed and evaluated these images and lab results as part of my medical decision-making.   EKG Interpretation   Date/Time:  Sunday  October 28 2015 12:32:15 EST Ventricular Rate:  94 PR Interval:  158 QRS Duration: 80 QT Interval:  342 QTC Calculation: 428 R Axis:   93 Text Interpretation:  Sinus rhythm Probable left atrial enlargement  Consider right ventricular hypertrophy Probable left ventricular  hypertrophy Baseline wander in lead(s) V6 No significant change since last  tracing Confirmed by YAO  MD, DAVID ([id number]) on 10/28/2015 2:26:30 PM      MDM   Final diagnoses:  Chest wall pain   DDx includes anxiety vs GERD vs pericarditis. Pt has had many ED visits for chest pain all with negative workups for ACS. Her echo and other cardiology f/u have been unremarkable. Given her history, pain reproducible with palpation, HEART score 2, i think ACS is unlikely. Question pericarditis given pain worse with respiration, increased when supine relief when sitting up. Though no recent viral symptoms. Will check EKG, trop, cxr, cbc, bmp, esr. Will give ativan and toradol.  Pt states toradol and ativan have not helped at all. She is very concerned about her chest pain. Her workup is entirely unremarkable. She is not tachycardic today. Will give dilaudid given pt's degree of pain and tenderness.   Pt continues to endorse 9/10 pain with dilaudid. She states that she wants to try going home. Will give GI cocktail. Pt instructed to f/u with cards if pain continues. Will also give referral to GI as I suspect there might be component of GERD. Will give rx for PO prilosec and zofran. Will give resource guide to establish pcp.   Anne Ng, PA-C 10/28/15 2024  Wandra Arthurs, MD 10/30/15 [phone]

## 2015-10-28 NOTE — ED Notes (Signed)
Patient transported to X-ray 

## 2015-10-28 NOTE — ED Notes (Signed)
Pt pulled her own IV and refused DC vital signs

## 2015-10-28 NOTE — Discharge Instructions (Signed)
Your workup today was negative. It is unlikely that your chest pain is due to your heart or lungs. It sounds like it might be due to acid reflux. I will give you a prescription for Prilosec to take every day. I will also give you a prescription for a GI cocktail as well as Zofran (for nausea). Please call GI to establish care and follow-up within one week. Please call your cardiologist within one week for follow-up as well.   Please obtain all of your results from medical records or have your doctors office obtain the results - share them with your doctor - you should be seen at your doctors office in the next 2 days. Call today to arrange your follow up. Take the medications as prescribed. Please review all of the medicines and only take them if you do not have an allergy to them. Please be aware that if you are taking birth control pills, taking other prescriptions, ESPECIALLY ANTIBIOTICS may make the birth control ineffective - if this is the case, either do not engage in sexual activity or use alternative methods of birth control such as condoms until you have finished the medicine and your family doctor says it is OK to restart them. If you are on a blood thinner such as COUMADIN, be aware that any other medicine that you take may cause the coumadin to either work too much, or not enough - you should have your coumadin level rechecked in next 7 days if this is the case.  ?  It is also a possibility that you have an allergic reaction to any of the medicines that you have been prescribed - Everybody reacts differently to medications and while MOST people have no trouble with most medicines, you may have a reaction such as nausea, vomiting, rash, swelling, shortness of breath. If this is the case, please stop taking the medicine immediately and contact your physician.  ?  You should return to the ER if you develop severe or worsening symptoms.

## 2015-10-28 NOTE — ED Notes (Signed)
Pt called out to tell RN that she wants to be DC now and does not want to wait to be reassessed after receiving the GI cocktail.

## 2015-10-28 NOTE — ED Notes (Signed)
Pt c/o intermittent central chest pressure radiating into neck, emesis, SOB, and lightheadedness x 5 days.  Pain score 10/10.  Pt reports taking OTC and prescriptions w/o relief.  Pt reports that she is followed by [facility].

## 2015-10-29 ENCOUNTER — Telehealth: Payer: Self-pay | Admitting: Cardiology

## 2015-10-29 NOTE — Telephone Encounter (Signed)
Received text page that patient called after hours complaining of chest pain. I called to speak with patient and to advise. Patient did not pick up. Message placed for patient to call back or seek emergent medical attention at ED if severe CP.   [name]

## 2015-10-30 ENCOUNTER — Telehealth: Payer: Self-pay | Admitting: Internal Medicine

## 2015-10-30 NOTE — Telephone Encounter (Signed)
Follow up  Pt wants a appt today so she can be admitted into hospital

## 2015-10-30 NOTE — Telephone Encounter (Signed)
Returned call to patient Seen in ED 12/5 and evaluated Today she said that she is till having chest pain and has so for one week Pain is intermittent Patient said she feels like she is just going to pass out and die Instructed patient that she needs to go to the ED if she is feeling this way. Her friend got on the phone and said the ED told her that there were not more test that could be done for her unless the cardiologist admits her and does further test. Asked if she had followed up with primary care of GI doctor as suggested at ED; said no, because she knows it is not her heart. Became very frustrated and hung up

## 2015-10-30 NOTE — Telephone Encounter (Signed)
Discussed patient with Kathleen Argue PA Patient does not want to follow up with PCP or Primary Care.   She said she knows it is her heart and not her stomach  Told her that there is no available appointment today She is willing to wait until Thursday to see if she can be seen in [place] with Dr. Caryl Comes here.  Melissa said that she would talk to Dr. Caryl Comes when he comes in this afternoon and see what he would like to do. She said that she would call the patient and add her on to the schedule if that is what the decision is

## 2015-10-30 NOTE — Telephone Encounter (Signed)
New message      Pt c/o of Chest Pain: STAT if CP now or developed within 24 hours  1. Are you having CP right now? yes 2. Are you experiencing any other symptoms (ex. SOB, nausea, vomiting, sweating)? Nausea and sob  3. How long have you been experiencing CP?  1 week  4. Is your CP continuous or coming and going?  Comes and goes  5. Have you taken Nitroglycerin? No Pt went to ER Sunday for this same reason---please advise?

## 2015-10-31 NOTE — Telephone Encounter (Signed)
Patient scheduled for an appointment with Chanetta Marshall, NP on 12/8.

## 2015-11-01 ENCOUNTER — Encounter: Payer: Self-pay | Admitting: Nurse Practitioner

## 2015-11-01 ENCOUNTER — Ambulatory Visit (INDEPENDENT_AMBULATORY_CARE_PROVIDER_SITE_OTHER): Payer: Medicaid Other | Admitting: Nurse Practitioner

## 2015-11-01 VITALS — BP 130/70 | HR 87 | Ht 70.0 in | Wt 144.6 lb

## 2015-11-01 DIAGNOSIS — R079 Chest pain, unspecified: Secondary | ICD-10-CM | POA: Diagnosis not present

## 2015-11-01 LAB — TSH: TSH: 0.85 u[IU]/mL (ref 0.350–4.500)

## 2015-11-01 NOTE — Progress Notes (Signed)
Electrophysiology Office Note Date: 11/01/2015  ID:  Maria, [name] 1986/07/21, MRN [id number]  PCP: Minerva Ends, MD Primary Cardiologist: Caryl Comes  CC: chest pain  [name] is a 29 y.o. female seen today for Dr Caryl Comes.  She presents today for evaluation of chest pain. She has been seen in the ER multiple times for similar complaints, the last being 10/28/15.  During those visits, work ups have been unremarkable with negative cardiac enzymes.  Multiple treatments have been tried including narcotics and GI coctails without relief.  Echo 07/2014 demonstrated EF 60-65%, no RWMA, grade 1 diastolic dysfunction.  Upper endo 07/2015 demonstrated mild caustic esophageal irritation.  She describes her chest pain as waxing and waning and a upper abdominal twisting pain with radiation into left arm and left jaw. She denies palpitations, dyspnea, PND, orthopnea, nausea, vomiting, dizziness, syncope.  She denies recent increased stress. She has had hair loss. She has a diagnosis of fibromyalgia but is not currently followed by a PCP.  Past Medical History  Diagnosis Date  . Seizures ([place]) 2001  . Depression Dx 2000  . Panic attack Dx 2006  . Hypertension Dx [id number]  . Heart murmur Dx 2015  . Arrhythmia Dx 2015  . PTSD (post-traumatic stress disorder) Dx 2013  . 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 Back pain   . Lumbar radiculopathy   . Vitamin D deficiency 01/24/2015  . Fibromyalgia    Past Surgical History  Procedure Laterality Date  . Esophagogastroduodenoscopy N/A 08/14/2015    Procedure: ESOPHAGOGASTRODUODENOSCOPY (EGD);  Surgeon: Carol Ada, MD;  Location: [facility];  Service: Endoscopy;  Laterality: N/A;    Current Outpatient Prescriptions  Medication Sig Dispense Refill  . Alum & Mag Hydroxide-Simeth (GI COCKTAIL) SUSP suspension Take 30 mLs by mouth 2 (two) times daily as needed for indigestion. Shake well.    . docusate sodium (COLACE) 100 MG capsule  Take 100 mg by mouth 2 (two) times daily as needed for mild constipation.    . DULoxetine (CYMBALTA) 20 MG capsule Take 1 capsule (20 mg total) by mouth daily. 30 capsule 2  . fluticasone (FLONASE) 50 MCG/ACT nasal spray Place 2 sprays into both nostrils daily as needed for allergies or rhinitis.    . fluticasone (FLONASE) 50 MCG/ACT nasal spray Place 2 sprays into both nostrils 2 (two) times daily as needed for allergies or rhinitis.    Marland Kitchen gabapentin (NEURONTIN) 300 MG capsule Take 300 mg by mouth 3 (three) times daily.     . methocarbamol (ROBAXIN) 500 MG tablet Take 500 mg by mouth every 6 (six) hours as needed for muscle spasms.    . metoprolol succinate (TOPROL-XL) 25 MG 24 hr tablet Take 25 mg by mouth daily.    . naproxen (NAPROSYN) 500 MG tablet Take 1 tablet (500 mg total) by mouth 2 (two) times daily as needed for mild pain, moderate pain or headache (TAKE WITH MEALS.). 20 tablet 0  . omeprazole (PRILOSEC) 40 MG capsule Take 1 capsule (40 mg total) by mouth daily. 30 capsule 3  . ondansetron (ZOFRAN ODT) 4 MG disintegrating tablet Take 1 tablet (4 mg total) by mouth every 8 (eight) hours as needed for nausea or vomiting. 20 tablet 0  . polyethylene glycol (MIRALAX / GLYCOLAX) packet Take 17 g by mouth daily as needed for mild constipation.    . pregabalin (LYRICA) 75 MG capsule Take 1 capsule (75 mg total) by mouth 3 (  three) times daily. 90 capsule 6  . promethazine (PHENERGAN) 25 MG tablet Take 25 mg by mouth every 6 (six) hours as needed for nausea or vomiting.    . propranolol ER (INDERAL LA) 120 MG 24 hr capsule Take 1 capsule (120 mg total) by mouth daily. 30 capsule 6   No current facility-administered medications for this visit.    Allergies:   Aspirin   Social History: Social History   Social History  . Marital Status: Single    Spouse Name: N/A  . Number of Children: N/A  . Years of Education: N/A   Occupational History  . Not on file.   Social History Main Topics    . Smoking status: Former Smoker    Quit date: 06/23/2012  . Smokeless tobacco: Never Used  . Alcohol Use: Yes  . Drug Use: No  . Sexual Activity: Yes    Birth Control/ Protection: None   Other Topics Concern  . Not on file   Social History Narrative   Patient is single, currently raising her nephews   Patient is right handed   Patient's education level is high school graduate   Patient doesn't drink caffeine          Family History: Family History  Problem Relation Age of Onset  . Heart attack Mother   . Heart disease Mother   . Sudden death Mother   . Sudden death Sister   . Heart attack Sister   . Sudden death Maternal Grandmother   . Fainting Maternal Grandmother     Review of Systems: All other systems reviewed and are otherwise negative except as noted above.   Physical Exam: VS:  BP 130/70 mmHg  Pulse 87  Ht 5\' 10"  (1.778 m)  Wt 144 lb 9.6 oz (65.59 kg)  BMI 20.75 kg/m2  SpO2 99%  LMP 10/25/2015 , BMI Body mass index is 20.75 kg/(m^2). Wt Readings from Last 3 Encounters:  11/01/15 144 lb 9.6 oz (65.59 kg)  08/29/15 138 lb (62.596 kg)  07/08/15 134 lb (60.782 kg)    GEN- The patient is thin and anxious appearing, alert and oriented x 3 today.   HEENT: normocephalic, atraumatic; sclera clear, conjunctiva pink; hearing intact; oropharynx clear; neck supple,  Lungs- Clear to ausculation bilaterally, normal work of breathing.  No wheezes, rales, rhonchi Heart- Regular rate and rhythm  GI- soft, non-tender, non-distended, bowel sounds present  Extremities- no clubbing, cyanosis, or edema; DP/PT/radial pulses 2+ bilaterally, indurated, red area 2cm x2cm right lateral shin, painful to touch MS- no significant deformity or atrophy Skin- warm and dry, no rash or lesion  Psych- euthymic mood, full affect Neuro- strength and sensation are intact   EKG:  EKG is ordered today. The ekg ordered today shows sinus rhythm, rate 87, normal intervals, no change from  previous  Recent Labs: 08/23/2015: ALT 9* 10/28/2015: BUN 22*; Creatinine, Ser 0.71; Hemoglobin 13.2; Platelets 241; Potassium 3.7; Sodium 141    Other studies Reviewed: Additional studies/ records that were reviewed today include: Dr Olin Pia notes, ER records  Assessment and Plan: 1.  Chest pain Atypical  Troponin negative after persistent pain for 4 days makes cardiac cause very unlikely. Discussed with Dr Caryl Comes today, recommend follow up with PCP to explore other possible non-cardiac causes  2.  SVT Not yet documented Treated with BB Stable No change required today  3.  Hair loss TSH   Current medicines are reviewed at length with the patient today.   The  patient does not have concerns regarding her medicines.  The following changes were made today:  none  Labs/ tests ordered today include: TSH    Disposition:   Follow up with EP prn    Signed, Chanetta Marshall, NP 11/01/2015 2:08 PM   [address] [place] [address] [phone] (office) [phone] (fax)

## 2015-11-01 NOTE — Patient Instructions (Addendum)
  Medication Instructions:  Your physician recommends that you continue on your current medications as directed. Please refer to the Current Medication list given to you today.    If you need a refill on your cardiac medications before your next appointment, please call your pharmacy.   Labwork:  TSH     Testing/Procedures: NONE ORDER TODAY    Follow-Up:  AS NEEDED FOR  ANY CARDIAC RELATED SYMPTOMS    Any Other Special Instructions Will Be Listed Below (If Applicable).

## 2015-11-05 ENCOUNTER — Telehealth: Payer: Self-pay | Admitting: *Deleted

## 2015-11-05 NOTE — Telephone Encounter (Signed)
-----   Message from Patsey Berthold, NP sent at 11/02/2015  4:53 AM EST ----- Please notify patient of normal labs.

## 2015-12-03 ENCOUNTER — Ambulatory Visit: Payer: Medicaid Other | Admitting: Internal Medicine

## 2015-12-03 ENCOUNTER — Ambulatory Visit: Payer: Self-pay | Admitting: Internal Medicine

## 2015-12-04 ENCOUNTER — Encounter: Payer: Self-pay | Admitting: Internal Medicine

## 2016-03-27 ENCOUNTER — Encounter (HOSPITAL_COMMUNITY): Payer: Self-pay | Admitting: Emergency Medicine

## 2016-03-27 ENCOUNTER — Emergency Department (HOSPITAL_COMMUNITY)
Admission: EM | Admit: 2016-03-27 | Discharge: 2016-03-27 | Disposition: A | Discharge status: Home / Self Care | Payer: Self-pay | Attending: Emergency Medicine | Admitting: Emergency Medicine

## 2016-03-27 ENCOUNTER — Emergency Department (HOSPITAL_COMMUNITY): Payer: Self-pay

## 2016-03-27 DIAGNOSIS — M797 Fibromyalgia: Secondary | ICD-10-CM | POA: Insufficient documentation

## 2016-03-27 DIAGNOSIS — I10 Essential (primary) hypertension: Secondary | ICD-10-CM | POA: Insufficient documentation

## 2016-03-27 DIAGNOSIS — Z79899 Other long term (current) drug therapy: Secondary | ICD-10-CM | POA: Insufficient documentation

## 2016-03-27 DIAGNOSIS — F329 Major depressive disorder, single episode, unspecified: Secondary | ICD-10-CM | POA: Insufficient documentation

## 2016-03-27 DIAGNOSIS — Z7951 Long term (current) use of inhaled steroids: Secondary | ICD-10-CM | POA: Insufficient documentation

## 2016-03-27 DIAGNOSIS — Z87891 Personal history of nicotine dependence: Secondary | ICD-10-CM | POA: Insufficient documentation

## 2016-03-27 DIAGNOSIS — F4310 Post-traumatic stress disorder, unspecified: Secondary | ICD-10-CM | POA: Insufficient documentation

## 2016-03-27 DIAGNOSIS — N72 Inflammatory disease of cervix uteri: Secondary | ICD-10-CM

## 2016-03-27 DIAGNOSIS — Z791 Long term (current) use of non-steroidal anti-inflammatories (NSAID): Secondary | ICD-10-CM | POA: Insufficient documentation

## 2016-03-27 DIAGNOSIS — M5442 Lumbago with sciatica, left side: Secondary | ICD-10-CM

## 2016-03-27 DIAGNOSIS — M546 Pain in thoracic spine: Secondary | ICD-10-CM

## 2016-03-27 LAB — WET PREP, GENITAL
Clue Cells Wet Prep HPF POC: NONE SEEN
Sperm: NONE SEEN
Trich, Wet Prep: NONE SEEN
Yeast Wet Prep HPF POC: NONE SEEN

## 2016-03-27 LAB — URINALYSIS, ROUTINE W REFLEX MICROSCOPIC
Bilirubin Urine: NEGATIVE
Glucose, UA: NEGATIVE mg/dL
Hgb urine dipstick: NEGATIVE
Ketones, ur: NEGATIVE mg/dL
Leukocytes, UA: NEGATIVE
Nitrite: NEGATIVE
Protein, ur: NEGATIVE mg/dL
Specific Gravity, Urine: 1.008 (ref 1.005–1.030)
pH: 6.5 (ref 5.0–8.0)

## 2016-03-27 LAB — POC URINE PREG, ED: Preg Test, Ur: NEGATIVE

## 2016-03-27 MED ORDER — HYDROMORPHONE HCL 1 MG/ML IJ SOLN
1.0000 mg | Freq: Once | INTRAMUSCULAR | Status: AC
  Administered 2016-03-27: 1 mg via INTRAMUSCULAR
  Filled 2016-03-27: qty 1

## 2016-03-27 MED ORDER — DOXYCYCLINE HYCLATE 100 MG PO CAPS: 100.0000 mg | ORAL_CAPSULE | Freq: Two times a day (BID) | ORAL | Status: DC

## 2016-03-27 MED ORDER — LORAZEPAM 2 MG/ML IJ SOLN
1.0000 mg | Freq: Once | INTRAMUSCULAR | Status: AC
  Administered 2016-03-27: 1 mg via INTRAVENOUS

## 2016-03-27 MED ORDER — LORAZEPAM 2 MG/ML IJ SOLN
1.0000 mg | Freq: Once | INTRAMUSCULAR | Status: DC
  Filled 2016-03-27: qty 1

## 2016-03-27 MED ORDER — OXYCODONE-ACETAMINOPHEN 5-325 MG PO TABS: 1.0000 | ORAL_TABLET | Freq: Four times a day (QID) | ORAL | Status: DC | PRN

## 2016-03-27 MED ORDER — HYDROMORPHONE HCL 1 MG/ML IJ SOLN
1.0000 mg | Freq: Once | INTRAMUSCULAR | Status: AC
  Administered 2016-03-27: 1 mg via INTRAVENOUS
  Filled 2016-03-27: qty 1
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
## 2016-03-27 NOTE — ED Provider Notes (Signed)
CSN: [id number]     Arrival date & time 03/27/16  1033 History   First MD Initiated Contact with Patient 03/27/16 1112     Chief Complaint  Patient presents with  . Abdominal Pain  . Back Pain     (Consider location/radiation/quality/duration/timing/severity/associated sxs/prior Treatment) HPI [name] is a 30 y.o. female with PMH significant for Fibromyalgia, seizure, depression, HTN, PTSD, chronic back pain who presents with back pain.  She states she chronically deals with low back pain, but last night it became acutely worse after standing for a period of time cooking.  No fall/injury/trauma.  She states her pain is midline that radiates to lower thoracic back, into her sacrum, and down her left leg.  She reports intermittent numbness of her left leg and feels that it is weaker than the right.  She describes her pain as constant and sharp, like someone is stabbing her.  Aggravating factors include movement, standing, lying, and breathing.  No relieving factors.  She is currently taking Naproxen for her pain.  Associated symptoms include urinary frequency, lower abdominal pain and distention, and dysuria.  No bowel or bladder incontinence, fever, IVDU, or malignancy. Last MRI was in 2014, and normal.   Past Medical History  Diagnosis Date  . Seizures ([place]) 2001  . Depression Dx 2000  . Panic attack Dx 2006  . Hypertension Dx [id number]  . Heart murmur Dx 2015  . Arrhythmia Dx 2015  . PTSD (post-traumatic stress disorder) Dx 2013  . 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 Back pain   . Lumbar radiculopathy   . Vitamin D deficiency 01/24/2015  . Fibromyalgia    Past Surgical History  Procedure Laterality Date  . Esophagogastroduodenoscopy N/A 08/14/2015    Procedure: ESOPHAGOGASTRODUODENOSCOPY (EGD);  Surgeon: Carol Ada, MD;  Location: [facility];  Service: Endoscopy;  Laterality: N/A;   Family History  Problem Relation Age of Onset  . Heart attack Mother   .  Heart disease Mother   . Sudden death Mother   . Sudden death Sister   . Heart attack Sister   . Sudden death Maternal Grandmother   . Fainting Maternal Grandmother    Social History  Substance Use Topics  . Smoking status: Former Smoker    Quit date: 06/23/2012  . Smokeless tobacco: Never Used  . Alcohol Use: Yes   OB History    No data available     Review of Systems All other systems negative unless otherwise stated in HPI    Allergies  Aspirin  Home Medications   Prior to Admission medications   Medication Sig Start Date End Date Taking? Authorizing Provider  Alum & Mag Hydroxide-Simeth (GI COCKTAIL) SUSP suspension Take 30 mLs by mouth 2 (two) times daily as needed for indigestion. Shake well.   Yes Historical Provider, MD  docusate sodium (COLACE) 100 MG capsule Take 100 mg by mouth 2 (two) times daily as needed for mild constipation.   Yes Historical Provider, MD  DULoxetine (CYMBALTA) 20 MG capsule Take 1 capsule (20 mg total) by mouth daily. 03/13/15  Yes Josalyn Funches, MD  fluticasone (FLONASE) 50 MCG/ACT nasal spray Place 1 spray into both nostrils daily as needed for allergies or rhinitis.    Yes Historical Provider, MD  gabapentin (NEURONTIN) 300 MG capsule Take 300 mg by mouth 3 (three) times daily.    Yes Historical Provider, MD  metoprolol succinate (TOPROL-XL) 25 MG 24 hr tablet Take 25 mg by  mouth daily.   Yes Historical Provider, MD  naproxen (NAPROSYN) 500 MG tablet Take 1 tablet (500 mg total) by mouth 2 (two) times daily as needed for mild pain, moderate pain or headache (TAKE WITH MEALS.). 11/12/14  Yes Mercedes Camprubi-Soms, PA-C  omeprazole (PRILOSEC) 40 MG capsule Take 1 capsule (40 mg total) by mouth daily. 03/13/15  Yes Josalyn Funches, MD  ondansetron (ZOFRAN ODT) 4 MG disintegrating tablet Take 1 tablet (4 mg total) by mouth every 8 (eight) hours as needed for nausea or vomiting. 10/28/15  Yes [place], PA-C  polyethylene glycol (MIRALAX /  GLYCOLAX) packet Take 17 g by mouth daily as needed for mild constipation.   Yes Historical Provider, MD  pregabalin (LYRICA) 75 MG capsule Take 1 capsule (75 mg total) by mouth 3 (three) times daily. 12/22/14  Yes Melvenia Beam, MD  promethazine (PHENERGAN) 25 MG tablet Take 25 mg by mouth every 6 (six) hours as needed for nausea or vomiting.   Yes Historical Provider, MD  propranolol ER (INDERAL LA) 120 MG 24 hr capsule Take 1 capsule (120 mg total) by mouth daily. 08/29/15  Yes Deboraha Sprang, MD  sertraline (ZOLOFT) 25 MG tablet Take 25 mg by mouth daily.   Yes Historical Provider, MD   BP 122/87 mmHg  Pulse 83  Temp(Src) 98 F (36.7 C) (Oral)  Resp 18  SpO2 100% Physical Exam  Constitutional: She is oriented to person, place, and time. She appears well-developed and well-nourished.  She appears tearful and uncomfortable.   HENT:  Head: Atraumatic.  Eyes: Conjunctivae are normal.  Cardiovascular: Normal rate, regular rhythm, normal heart sounds and intact distal pulses.   Pulses:      Dorsalis pedis pulses are 2+ on the right side, and 2+ on the left side.  Pulmonary/Chest: Effort normal and breath sounds normal.  Abdominal: Soft. Bowel sounds are normal. She exhibits no distension. There is no tenderness.  Genitourinary: Rectum normal, vagina normal and uterus normal. Rectal exam shows anal tone normal. Pelvic exam was performed with patient supine. There is no rash or tenderness on the right labia. There is no rash or tenderness on the left labia. Cervix exhibits no motion tenderness and no discharge. Right adnexum displays no mass and no tenderness. Left adnexum displays no mass and no tenderness.  Musculoskeletal: She exhibits tenderness.  Diffuse lower thoracic (T10-T12) lumbar, and sacral midline tenderness.  No step offs. No crepitus.  Neurological: She is alert and oriented to person, place, and time. She has normal reflexes.  Reflex Scores:      Patellar reflexes are 2+ on  the right side and 2+ on the left side.      Achilles reflexes are 2+ on the right side and 2+ on the left side. No saddle anesthesia. Strength intact in hip flexion, knee flexion/extension, and ankle dorsal/plantar flexion, R>L.  Sensation intact bilaterally throughout lower extremities.  Patient unable to stand up straight due to pain.  Slowed, antalgic gait stooped over.   Skin: Skin is warm and dry.  Psychiatric: She has a normal mood and affect. Her behavior is normal.    ED Course  Procedures (including critical care time) Labs Review Labs Reviewed  WET PREP, GENITAL - Abnormal; Notable for the following:    WBC, Wet Prep HPF POC MANY (*)    All other components within normal limits  URINALYSIS, ROUTINE W REFLEX MICROSCOPIC (NOT AT [facility])  RPR  HIV ANTIBODY (ROUTINE TESTING)  POC URINE  PREG, ED  GC/CHLAMYDIA PROBE AMP ([place]) NOT AT [facility]    Imaging Review Mr Thoracic Spine Wo Contrast  03/27/2016  CLINICAL DATA:  Low back pain extending from the sacrum to the lumbar spine radiating into the hips for several weeks. Urinary frequency and urinary incontinence recently. EXAM: MRI THORACIC AND LUMBAR SPINE WITHOUT CONTRAST TECHNIQUE: Multiplanar and multiecho pulse sequences of the thoracic and lumbar spine were obtained without intravenous contrast. COMPARISON:  08/23/2015, 03/23/2013 FINDINGS: Despite efforts by the technologist and patient, motion artifact is present on today's exam and could not be eliminated. This reduces exam sensitivity and specificity. MR THORACIC SPINE FINDINGS No thoracic spine fracture or subluxation. No significant abnormal spinal cord signal is observed. No significant vertebral marrow edema is identified. No significant abnormal epidural process in the thoracic spine. No significant spondylosis or degenerative disc disease. No impingement is identified. MR LUMBAR SPINE FINDINGS The patient was only able to tolerate the sagittal T2 and sagittal T1 weighted  images before terminating the exam. Those are motion degraded. Pelvic ascites, also shown on the prior exam of 03/23/2013 No vertebral subluxation is observed. No lumbar spine fracture is apparent. No lumbar spine malalignment or fracture. No appreciable impingement on the 2 series obtained. No obvious abnormal epidural process. IMPRESSION: 1. No impingement, fracture, acute subluxation, or other specific abnormality is identified in the thoracic spine or lumbar spine to explain the patient's pain. Please note that all sequences are affected by motion artifact, and on the lumbar spine only 2 sequences could be performed before the patient terminated the exam. 2. Pelvic ascites. Pelvic ascites was also present on the prior lumbar spine MRI from 03/23/2013. Electronically Signed   By: Van Clines M.D.   On: 03/27/2016 15:52   Mr Lumbar Spine Wo Contrast  03/27/2016  CLINICAL DATA:  Low back pain extending from the sacrum to the lumbar spine radiating into the hips for several weeks. Urinary frequency and urinary incontinence recently. EXAM: MRI THORACIC AND LUMBAR SPINE WITHOUT CONTRAST TECHNIQUE: Multiplanar and multiecho pulse sequences of the thoracic and lumbar spine were obtained without intravenous contrast. COMPARISON:  08/23/2015, 03/23/2013 FINDINGS: Despite efforts by the technologist and patient, motion artifact is present on today's exam and could not be eliminated. This reduces exam sensitivity and specificity. MR THORACIC SPINE FINDINGS No thoracic spine fracture or subluxation. No significant abnormal spinal cord signal is observed. No significant vertebral marrow edema is identified. No significant abnormal epidural process in the thoracic spine. No significant spondylosis or degenerative disc disease. No impingement is identified. MR LUMBAR SPINE FINDINGS The patient was only able to tolerate the sagittal T2 and sagittal T1 weighted images before terminating the exam. Those are motion  degraded. Pelvic ascites, also shown on the prior exam of 03/23/2013 No vertebral subluxation is observed. No lumbar spine fracture is apparent. No lumbar spine malalignment or fracture. No appreciable impingement on the 2 series obtained. No obvious abnormal epidural process. IMPRESSION: 1. No impingement, fracture, acute subluxation, or other specific abnormality is identified in the thoracic spine or lumbar spine to explain the patient's pain. Please note that all sequences are affected by motion artifact, and on the lumbar spine only 2 sequences could be performed before the patient terminated the exam. 2. Pelvic ascites. Pelvic ascites was also present on the prior lumbar spine MRI from 03/23/2013. Electronically Signed   By: Van Clines M.D.   On: 03/27/2016 15:52   I have personally reviewed and evaluated these images and lab  results as part of my medical decision-making.   EKG Interpretation None      MDM  11:47 AM: Patient presents with exacerbation of chronic LBP onset last night.  Urinary symptoms. No vaginal symptoms.  No b/b incontinence.  Decreased strength on the left lower extremity versus the right.  Normal DTRs and sensation.  Patient unable to stand up straight due to pain.  Slow antalgic gait.  Case discussed with Dr. Maryan Rued.  Will obtain bladder scan, UA, urine pregnancy, and pelvic exam.  Dilaudid for pain control.  Will obtain thoracic and lumbar MRI.   12:50 PM: Normal rectal tone on exam.  No perianal anesthesia.  No CMT or adnexal tenderness.  Low suspicion for PID, TOA, or ovarian torsion.  UA negative.  Many WBCs on wet prep.  Will treat with Doxycycline BID x 14 days.  Given follow up with [facility].  MR thoracic and lumbar spine negative.  I suspect this is more gynecological.  Recommend continued Naproxen.  Will give short course of narcotics for this acute exacerbation.  Discussed return precautions.  Patient agrees and acknowledges the above plan for  discharge.  Final diagnoses:  Thoracic back pain  Midline low back pain with left-sided sciatica  Cervicitis   Case has been discussed with Dr. Maryan Rued who agrees with the above plan for discharge.      Gloriann Loan, PA-C 03/27/16 1649  Gloriann Loan, PA-C 03/27/16 1657  Blanchie Dessert, MD 03/27/16 1740

## 2016-03-27 NOTE — ED Notes (Signed)
Discharge instructions, follow up care, and rx x1 reviewed with patient. Patient verbalized understanding. 

## 2016-03-27 NOTE — Discharge Instructions (Signed)
Your evaluation today does not show an emergent cause for your symptoms.  Your MRI of your thoracic and lumbar spine is negative.  Your labs show possible evidence of Cervicitis.  We will treat that with Doxycycline.  Take that twice daily for 7 days.  [facility] to schedule a follow up appointment for further evaluation of your pain.  You may take Percocet every 6 hours as needed for pain.   Continue taking Naproxen.   Cervicitis Cervicitis is a soreness and swelling (inflammation) of the cervix. Your cervix is located at the bottom of your uterus. It opens up to the vagina. CAUSES   Sexually transmitted infections (STIs).   Allergic reaction.   Medicines or birth control devices that are put in the vagina.   Injury to the cervix.   Bacterial infections.  RISK FACTORS You are at greater risk if you:  Have unprotected sexual intercourse.  Have sexual intercourse with many partners.  Began sexual intercourse at an early age.  Have a history of STIs. SYMPTOMS  There may be no symptoms. If symptoms occur, they may include:   Gray, white, yellow, or bad-smelling vaginal discharge.   Pain or itching of the area outside the vagina.   Painful sexual intercourse.   Lower abdominal or lower back pain, especially during intercourse.   Frequent urination.   Abnormal vaginal bleeding between periods, after sexual intercourse, or after menopause.   Pressure or a heavy feeling in the pelvis.  DIAGNOSIS  Diagnosis is made after a pelvic exam. Other tests may include:   Examination of any discharge under a microscope (wet prep).   A Pap test.  TREATMENT  Treatment will depend on the cause of cervicitis. If it is caused by an STI, both you and your partner will need to be treated. Antibiotic medicines will be given.  HOME CARE INSTRUCTIONS   Do not have sexual intercourse until your health care provider says it is okay.   Do not have sexual  intercourse until your partner has been treated, if your cervicitis is caused by an STI.   Take your antibiotics as directed. Finish them even if you start to feel better.  SEEK MEDICAL CARE IF:  Your symptoms come back.   You have a fever.  MAKE SURE YOU:   Understand these instructions.  Will watch your condition.  Will get help right away if you are not doing well or get worse.   This information is not intended to replace advice given to you by your health care provider. Make sure you discuss any questions you have with your health care provider.   Document Released: 11/10/2005 Document Revised: 11/15/2013 Document Reviewed: 05/04/2013 Elsevier Interactive Patient Education 2016 Else[employer]  Back Pain, Adult Back pain is very common in adults.The cause of back pain is rarely dangerous and the pain often gets better over time.The cause of your back pain may not be known. Some common causes of back pain include:  Strain of the muscles or ligaments supporting the spine.  Wear and tear (degeneration) of the spinal disks.  Arthritis.  Direct injury to the back. For many people, back pain may return. Since back pain is rarely dangerous, most people can learn to manage this condition on their own. HOME CARE INSTRUCTIONS Watch your back pain for any changes. The following actions may help to lessen any discomfort you are feeling:  Remain active. It is stressful on your back to sit or stand in  one place for long periods of time. Do not sit, drive, or stand in one place for more than 30 minutes at a time. Take short walks on even surfaces as soon as you are able.Try to increase the length of time you walk each day.  Exercise regularly as directed by your health care provider. Exercise helps your back heal faster. It also helps avoid future injury by keeping your muscles strong and flexible.  Do not stay in bed.Resting more than 1-2 days can delay your recovery.  Pay  attention to your body when you bend and lift. The most comfortable positions are those that put less stress on your recovering back. Always use proper lifting techniques, including:  Bending your knees.  Keeping the load close to your body.  Avoiding twisting.  Find a comfortable position to sleep. Use a firm mattress and lie on your side with your knees slightly bent. If you lie on your back, put a pillow under your knees.  Avoid feeling anxious or stressed.Stress increases muscle tension and can worsen back pain.It is important to recognize when you are anxious or stressed and learn ways to manage it, such as with exercise.  Take medicines only as directed by your health care provider. Over-the-counter medicines to reduce pain and inflammation are often the most helpful.Your health care provider may prescribe muscle relaxant drugs.These medicines help dull your pain so you can more quickly return to your normal activities and healthy exercise.  Apply ice to the injured area:  Put ice in a plastic bag.  Place a towel between your skin and the bag.  Leave the ice on for 20 minutes, 2-3 times a day for the first 2-3 days. After that, ice and heat may be alternated to reduce pain and spasms.  Maintain a healthy weight. Excess weight puts extra stress on your back and makes it difficult to maintain good posture. SEEK MEDICAL CARE IF:  You have pain that is not relieved with rest or medicine.  You have increasing pain going down into the legs or buttocks.  You have pain that does not improve in one week.  You have night pain.  You lose weight.  You have a fever or chills. SEEK IMMEDIATE MEDICAL CARE IF:   You develop new bowel or bladder control problems.  You have unusual weakness or numbness in your arms or legs.  You develop nausea or vomiting.  You develop abdominal pain.  You feel faint.   This information is not intended to replace advice given to you by your  health care provider. Make sure you discuss any questions you have with your health care provider.   Document Released: 11/10/2005 Document Revised: 12/01/2014 Document Reviewed: 03/14/2014 Elsevier Interactive Patient Education [employer].

## 2016-03-28 LAB — RPR: RPR Ser Ql: NONREACTIVE

## 2016-03-28 LAB — GC/CHLAMYDIA PROBE AMP (~~LOC~~) NOT AT ARMC
Chlamydia: NEGATIVE
Neisseria Gonorrhea: NEGATIVE

## 2016-03-28 LAB — HIV ANTIBODY (ROUTINE TESTING W REFLEX): HIV Screen 4th Generation wRfx: NONREACTIVE

## 2016-04-25 ENCOUNTER — Encounter: Payer: Self-pay | Admitting: Obstetrics & Gynecology

## 2016-04-25 ENCOUNTER — Telehealth: Payer: Self-pay | Admitting: *Deleted

## 2016-04-25 NOTE — Telephone Encounter (Signed)
[name] did not keep her appointment scheduled today for follow up from ER for pelvic pain. Discussed with Dr. Roselie Awkward and then I called patient and left a message notifying her she missed her appointment  And to call and reschedule if she is still having any issues.

## 2016-06-04 ENCOUNTER — Encounter (HOSPITAL_COMMUNITY): Payer: Self-pay

## 2016-06-04 ENCOUNTER — Emergency Department (HOSPITAL_COMMUNITY): Payer: Self-pay

## 2016-06-04 ENCOUNTER — Emergency Department (HOSPITAL_COMMUNITY)
Admission: EM | Admit: 2016-06-04 | Discharge: 2016-06-04 | Disposition: A | Discharge status: Home / Self Care | Payer: Self-pay | Attending: Emergency Medicine | Admitting: Emergency Medicine

## 2016-06-04 DIAGNOSIS — K2970 Gastritis, unspecified, without bleeding: Secondary | ICD-10-CM | POA: Insufficient documentation

## 2016-06-04 DIAGNOSIS — I10 Essential (primary) hypertension: Secondary | ICD-10-CM | POA: Insufficient documentation

## 2016-06-04 DIAGNOSIS — Z79899 Other long term (current) drug therapy: Secondary | ICD-10-CM | POA: Insufficient documentation

## 2016-06-04 DIAGNOSIS — M549 Dorsalgia, unspecified: Secondary | ICD-10-CM | POA: Insufficient documentation

## 2016-06-04 DIAGNOSIS — F329 Major depressive disorder, single episode, unspecified: Secondary | ICD-10-CM | POA: Insufficient documentation

## 2016-06-04 DIAGNOSIS — Z87891 Personal history of nicotine dependence: Secondary | ICD-10-CM | POA: Insufficient documentation

## 2016-06-04 DIAGNOSIS — F410 Panic disorder [episodic paroxysmal anxiety] without agoraphobia: Secondary | ICD-10-CM | POA: Insufficient documentation

## 2016-06-04 DIAGNOSIS — R079 Chest pain, unspecified: Secondary | ICD-10-CM | POA: Insufficient documentation

## 2016-06-04 LAB — CBC
HCT: 35.2 % — ABNORMAL LOW (ref 36.0–46.0)
Hemoglobin: 12.6 g/dL (ref 12.0–15.0)
MCH: 31.6 pg (ref 26.0–34.0)
MCHC: 35.8 g/dL (ref 30.0–36.0)
MCV: 88.2 fL (ref 78.0–100.0)
Platelets: 248 10*3/uL (ref 150–400)
RBC: 3.99 MIL/uL (ref 3.87–5.11)
RDW: 12 % (ref 11.5–15.5)
WBC: 4.5 10*3/uL (ref 4.0–10.5)

## 2016-06-04 LAB — URINALYSIS, ROUTINE W REFLEX MICROSCOPIC
Bilirubin Urine: NEGATIVE
Glucose, UA: NEGATIVE mg/dL
Hgb urine dipstick: NEGATIVE
Ketones, ur: NEGATIVE mg/dL
Leukocytes, UA: NEGATIVE
Nitrite: NEGATIVE
Protein, ur: NEGATIVE mg/dL
Specific Gravity, Urine: 1.017 (ref 1.005–1.030)
pH: 6 (ref 5.0–8.0)

## 2016-06-04 LAB — COMPREHENSIVE METABOLIC PANEL
ALT: 10 U/L — ABNORMAL LOW (ref 14–54)
AST: 16 U/L (ref 15–41)
Albumin: 4.2 g/dL (ref 3.5–5.0)
Alkaline Phosphatase: 54 U/L (ref 38–126)
Anion gap: 9 (ref 5–15)
BUN: 9 mg/dL (ref 6–20)
CO2: 24 mmol/L (ref 22–32)
Calcium: 9 mg/dL (ref 8.9–10.3)
Chloride: 107 mmol/L (ref 101–111)
Creatinine, Ser: 0.59 mg/dL (ref 0.44–1.00)
GFR calc Af Amer: >60 mL/min (ref >60)
GFR calc non Af Amer: >60 mL/min (ref >60)
Glucose, Bld: 108 mg/dL — ABNORMAL HIGH (ref 65–99)
Potassium: 3.5 mmol/L (ref 3.5–5.1)
Sodium: 140 mmol/L (ref 135–145)
Total Bilirubin: 1.3 mg/dL — ABNORMAL HIGH (ref 0.3–1.2)
Total Protein: 6.9 g/dL (ref 6.5–8.1)

## 2016-06-04 LAB — LIPASE, BLOOD: Lipase: 19 U/L (ref 11–51)

## 2016-06-04 LAB — I-STAT TROPONIN, ED: Troponin i, poc: 0 ng/mL (ref 0.00–0.08)

## 2016-06-04 LAB — I-STAT BETA HCG BLOOD, ED (MC, WL, AP ONLY): I-stat hCG, quantitative: <5 m[IU]/mL (ref <5)

## 2016-06-04 MED ORDER — SUCRALFATE 1 GM/10ML PO SUSP: 1.0000 g | Freq: Three times a day (TID) | ORAL | Status: DC

## 2016-06-04 MED ORDER — FAMOTIDINE IN NACL 20-0.9 MG/50ML-% IV SOLN
20.0000 mg | Freq: Once | INTRAVENOUS | Status: AC
  Administered 2016-06-04: 20 mg via INTRAVENOUS
  Filled 2016-06-04: qty 50

## 2016-06-04 MED ORDER — ONDANSETRON HCL 4 MG/2ML IJ SOLN
4.0000 mg | Freq: Once | INTRAMUSCULAR | Status: AC
  Administered 2016-06-04: 4 mg via INTRAVENOUS
  Filled 2016-06-04: qty 2

## 2016-06-04 MED ORDER — ONDANSETRON HCL 4 MG PO TABS: 4.0000 mg | ORAL_TABLET | Freq: Three times a day (TID) | ORAL | Status: DC | PRN

## 2016-06-04 MED ORDER — FAMOTIDINE 20 MG PO TABS: 20.0000 mg | ORAL_TABLET | Freq: Two times a day (BID) | ORAL | Status: DC

## 2016-06-04 MED ORDER — SODIUM CHLORIDE 0.9 % IV BOLUS (SEPSIS)
1000.0000 mL | Freq: Once | INTRAVENOUS | Status: AC
  Administered 2016-06-04: 1000 mL via INTRAVENOUS

## 2016-06-04 MED ORDER — MORPHINE SULFATE (PF) 4 MG/ML IV SOLN
4.0000 mg | Freq: Once | INTRAVENOUS | Status: AC
  Administered 2016-06-04: 4 mg via INTRAVENOUS
  Filled 2016-06-04: qty 1

## 2016-06-04 MED ORDER — IOPAMIDOL (ISOVUE-370) INJECTION 76%
100.0000 mL | Freq: Once | INTRAVENOUS | Status: AC | PRN
  Administered 2016-06-04: 100 mL via INTRAVENOUS

## 2016-06-04 NOTE — ED Notes (Signed)
Patient was alert, oriented and stable upon discharge. RN went over AVS and patient had no further questions.  

## 2016-06-04 NOTE — ED Notes (Signed)
Patient transported to CT 

## 2016-06-04 NOTE — ED Notes (Signed)
Pt tolerated oral fluids

## 2016-06-04 NOTE — ED Provider Notes (Signed)
CSN: [id number]     Arrival date & time 06/04/16  1727 History   First MD Initiated Contact with Patient 06/04/16 1824     Chief Complaint  Patient presents with  . Abdominal Pain  . Back Pain  . Emesis     (Consider location/radiation/quality/duration/timing/severity/associated sxs/prior Treatment) HPI   Blood pressure 109/87, pulse 91, temperature 99 F (37.2 C), temperature source Oral, resp. rate 13, height 5\' 10"  (1.778 m), weight 61.236 kg, last menstrual period 05/10/2016, SpO2 100 %.  [name] is a 30 y.o. female complaining of acute onset of chest pain, severe radiating to the back onset this morning associated with blood-streaked emesis, pain 10 out of 10, taken multiple over-the-counter pain medications with little relief. Reports left arm and leg numbness which have resolved no weakness, change in vision, fever, chills she endorses a dry cough  Past Medical History  Diagnosis Date  . Seizures ([place]) 2001  . Depression Dx 2000  . Panic attack Dx 2006  . Hypertension Dx [id number]  . Heart murmur Dx 2015  . Arrhythmia Dx 2015  . PTSD (post-traumatic stress disorder) Dx 2013  . 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 Back pain   . Lumbar radiculopathy   . Vitamin D deficiency 01/24/2015  . Fibromyalgia    Past Surgical History  Procedure Laterality Date  . Esophagogastroduodenoscopy N/A 08/14/2015    Procedure: ESOPHAGOGASTRODUODENOSCOPY (EGD);  Surgeon: Carol Ada, MD;  Location: [facility];  Service: Endoscopy;  Laterality: N/A;   Family History  Problem Relation Age of Onset  . Heart attack Mother   . Heart disease Mother   . Sudden death Mother   . Sudden death Sister   . Heart attack Sister   . Sudden death Maternal Grandmother   . Fainting Maternal Grandmother    Social History  Substance Use Topics  . Smoking status: Former Smoker    Quit date: 06/23/2012  . Smokeless tobacco: Never Used  . Alcohol Use: Yes   OB History    No data  available     Review of Systems  10 systems reviewed and found to be negative, except as noted in the HPI.   Allergies  Aspirin  Home Medications   Prior to Admission medications   Medication Sig Start Date End Date Taking? Authorizing Provider  gabapentin (NEURONTIN) 300 MG capsule Take 300 mg by mouth 3 (three) times daily.    Yes Historical Provider, MD  naproxen (NAPROSYN) 500 MG tablet Take 1 tablet (500 mg total) by mouth 2 (two) times daily as needed for mild pain, moderate pain or headache (TAKE WITH MEALS.). 11/12/14  Yes Mercedes Camprubi-Soms, PA-C  Alum & Mag Hydroxide-Simeth (GI COCKTAIL) SUSP suspension Take 30 mLs by mouth 2 (two) times daily as needed for indigestion. Shake well.    Historical Provider, MD  docusate sodium (COLACE) 100 MG capsule Take 100 mg by mouth 2 (two) times daily as needed for mild constipation.    Historical Provider, MD  doxycycline (VIBRAMYCIN) 100 MG capsule Take 1 capsule (100 mg total) by mouth 2 (two) times daily. Patient not taking: Reported on 06/04/2016 03/27/16   Gloriann Loan, PA-C  DULoxetine (CYMBALTA) 20 MG capsule Take 1 capsule (20 mg total) by mouth daily. 03/13/15   Josalyn Funches, MD  fluticasone (FLONASE) 50 MCG/ACT nasal spray Place 1 spray into both nostrils daily as needed for allergies or rhinitis.     Historical Provider, MD  metoprolol succinate (  TOPROL-XL) 25 MG 24 hr tablet Take 25 mg by mouth daily.    Historical Provider, MD  omeprazole (PRILOSEC) 40 MG capsule Take 1 capsule (40 mg total) by mouth daily. 03/13/15   Josalyn Funches, MD  ondansetron (ZOFRAN ODT) 4 MG disintegrating tablet Take 1 tablet (4 mg total) by mouth every 8 (eight) hours as needed for nausea or vomiting. 10/28/15   Anne Ng, PA-C  oxyCODONE-acetaminophen (PERCOCET/ROXICET) 5-325 MG tablet Take 1 tablet by mouth every 6 (six) hours as needed for severe pain. 03/27/16   Gloriann Loan, PA-C  polyethylene glycol (MIRALAX / GLYCOLAX) packet Take 17 g by mouth  daily as needed for mild constipation.    Historical Provider, MD  pregabalin (LYRICA) 75 MG capsule Take 1 capsule (75 mg total) by mouth 3 (three) times daily. 12/22/14   Melvenia Beam, MD  promethazine (PHENERGAN) 25 MG tablet Take 25 mg by mouth every 6 (six) hours as needed for nausea or vomiting.    Historical Provider, MD  propranolol ER (INDERAL LA) 120 MG 24 hr capsule Take 1 capsule (120 mg total) by mouth daily. 08/29/15   Deboraha Sprang, MD  sertraline (ZOLOFT) 25 MG tablet Take 25 mg by mouth daily.    Historical Provider, MD   BP 114/83 mmHg  Pulse 133  Temp(Src) 99 F (37.2 C) (Oral)  Resp 18  SpO2 98%  LMP 05/10/2016 Physical Exam  Constitutional: She is oriented to person, place, and time. She appears well-developed and well-nourished. No distress.  HENT:  Head: Normocephalic and atraumatic.  Mouth/Throat: Oropharynx is clear and moist.  Eyes: Conjunctivae and EOM are normal. Pupils are equal, round, and reactive to light.  No TTP of maxillary or frontal sinuses  No TTP or induration of temporal arteries bilaterally  Neck: Normal range of motion. Neck supple. No JVD present. No tracheal deviation present.  FROM to C-spine. Pt can touch chin to chest without discomfort. No TTP of midline cervical spine.   Cardiovascular: Normal rate, regular rhythm and intact distal pulses.   Tachycardic  Radial pulse equal bilaterally  Pulmonary/Chest: Effort normal and breath sounds normal. No stridor. No respiratory distress. She has no wheezes. She has no rales. She exhibits no tenderness.  Abdominal: Soft. Bowel sounds are normal. She exhibits no distension and no mass. There is no tenderness. There is no rebound and no guarding.  Musculoskeletal: Normal range of motion. She exhibits no edema or tenderness.  No calf asymmetry, superficial collaterals, palpable cords, edema, Homans sign negative bilaterally.    Neurological: She is alert and oriented to person, place, and time.  No cranial nerve deficit.  II-Visual fields grossly intact. III/IV/VI-Extraocular movements intact.  Pupils reactive bilaterally. V/VII-Smile symmetric, equal eyebrow raise,  facial sensation intact VIII- Hearing grossly intact IX/X-Normal gag XI-bilateral shoulder shrug XII-midline tongue extension Motor: 5/5 bilaterally with normal tone and bulk Cerebellar: Normal finger-to-nose  and normal heel-to-shin test.   Romberg negative Ambulates with a coordinated gait   Skin: Skin is warm. She is not diaphoretic.  Psychiatric: She has a normal mood and affect.  Nursing note and vitals reviewed.   ED Course  Procedures (including critical care time) Labs Review Labs Reviewed  CBC - Abnormal; Notable for the following:    HCT 35.2 (*)    All other components within normal limits  LIPASE, BLOOD  COMPREHENSIVE METABOLIC PANEL  URINALYSIS, ROUTINE W REFLEX MICROSCOPIC (NOT AT [facility])  I-STAT BETA HCG BLOOD, ED ([facility], [facility], [facility] ONLY)  Randolm Idol, ED    Imaging Review No results found. I have personally reviewed and evaluated these images and lab results as part of my medical decision-making.   EKG Interpretation   Date/Time:  Wednesday June 04 2016 17:42:07 EDT Ventricular Rate:  133 PR Interval:    QRS Duration: 66 QT Interval:  296 QTC Calculation: 441 R Axis:   75 Text Interpretation:  Sinus tachycardia Consider right atrial enlargement  RSR' in V1 or V2, probably normal variant Borderline repolarization  abnormality Since last tracing rate faster Confirmed by [facility]  MD, ELLIOTT  [phone]) on 06/04/2016 6:12:04 PM      MDM   Final diagnoses:  Gastritis    Filed Vitals:   06/04/16 1900 06/04/16 1915 06/04/16 1930 06/04/16 1944  BP:    104/72  Pulse: 93 82 85   Temp:      TempSrc:      Resp: 18 22 25    Height:      Weight:      SpO2: 97% 98% 100%     Medications  morphine 4 MG/ML injection 4 mg (4 mg Intravenous Given 06/04/16 1939)  ondansetron (ZOFRAN)  injection 4 mg (4 mg Intravenous Given 06/04/16 1937)  famotidine (PEPCID) IVPB 20 mg premix (0 mg Intravenous Stopped 06/04/16 2032)  sodium chloride 0.9 % bolus 1,000 mL (1,000 mLs Intravenous New Bag/Given 06/04/16 1937)  iopamidol (ISOVUE-370) 76 % injection 100 mL (100 mLs Intravenous Contrast Given 06/04/16 2033)    [name] is 30 y.o. female presenting with Severe chest and abdominal pain, she states she has had hematemesis starting this a.m., she's been vomiting 4, pain is severe, initially she is tachycardic to 133. Also having neurologic symptoms of hand migrating to the left lower extremity, now resolved, neurologic exam nonfocal, given the severity of her pain or neurologic symptoms well evaluate for dissection.  Blood work reassuring, CT with mild gastric wall thickening likely gastritis. Patient advised to follow with PCP, may need GI referral.  Evaluation does not show pathology that would require ongoing emergent intervention or inpatient treatment. Pt is hemodynamically stable and mentating appropriately. Discussed findings and plan with patient/guardian, who agrees with care plan. All questions answered. Return precautions discussed and outpatient follow up given.   New Prescriptions   FAMOTIDINE (PEPCID) 20 MG TABLET    Take 1 tablet (20 mg total) by mouth 2 (two) times daily.   ONDANSETRON (ZOFRAN) 4 MG TABLET    Take 1 tablet (4 mg total) by mouth every 8 (eight) hours as needed for nausea or vomiting.   SUCRALFATE (CARAFATE) 1 GM/10ML SUSPENSION    Take 10 mLs (1 g total) by mouth 4 (four) times daily -  with meals and at bedtime.        Monico Blitz, PA-C 06/04/16 2144  Davonna Belling, MD 06/05/16 [phone]

## 2016-06-04 NOTE — ED Notes (Signed)
ED PA at bedside

## 2016-06-04 NOTE — Discharge Instructions (Signed)
Push fluids: take small frequent sips of water or Gatorade, do not drink any soda, juice or caffeinated beverages.    Slowly resume solid diet as desired. Avoid food that are spicy, contain dairy and/or have high fat content.  Aviod NSAIDs (aspirin, motrin, ibuprofen, naproxen, Aleve et Ronney Asters) for pain control because they will irritate your stomach.  Please follow with your primary care doctor in the next 2 days for a check-up. They must obtain records for further management.   Do not hesitate to return to the Emergency Department for any new, worsening or concerning symptoms.    Gastritis, Adult Gastritis is soreness and swelling (inflammation) of the lining of the stomach. Gastritis can develop as a sudden onset (acute) or long-term (chronic) condition. If gastritis is not treated, it can lead to stomach bleeding and ulcers. CAUSES  Gastritis occurs when the stomach lining is weak or damaged. Digestive juices from the stomach then inflame the weakened stomach lining. The stomach lining may be weak or damaged due to viral or bacterial infections. One common bacterial infection is the Helicobacter pylori infection. Gastritis can also result from excessive alcohol consumption, taking certain medicines, or having too much acid in the stomach.  SYMPTOMS  In some cases, there are no symptoms. When symptoms are present, they may include:  Pain or a burning sensation in the upper abdomen.  Nausea.  Vomiting.  An uncomfortable feeling of fullness after eating. DIAGNOSIS  Your caregiver may suspect you have gastritis based on your symptoms and a physical exam. To determine the cause of your gastritis, your caregiver may perform the following:  Blood or stool tests to check for the H pylori bacterium.  Gastroscopy. A thin, flexible tube (endoscope) is passed down the esophagus and into the stomach. The endoscope has a light and camera on the end. Your caregiver uses the endoscope to view the  inside of the stomach.  Taking a tissue sample (biopsy) from the stomach to examine under a microscope. TREATMENT  Depending on the cause of your gastritis, medicines may be prescribed. If you have a bacterial infection, such as an H pylori infection, antibiotics may be given. If your gastritis is caused by too much acid in the stomach, H2 blockers or antacids may be given. Your caregiver may recommend that you stop taking aspirin, ibuprofen, or other nonsteroidal anti-inflammatory drugs (NSAIDs). HOME CARE INSTRUCTIONS  Only take over-the-counter or prescription medicines as directed by your caregiver.  If you were given antibiotic medicines, take them as directed. Finish them even if you start to feel better.  Drink enough fluids to keep your urine clear or pale yellow.  Avoid foods and drinks that make your symptoms worse, such as:  Caffeine or alcoholic drinks.  Chocolate.  Peppermint or mint flavorings.  Garlic and onions.  Spicy foods.  Citrus fruits, such as oranges, lemons, or limes.  Tomato-based foods such as sauce, chili, salsa, and pizza.  Fried and fatty foods.  Eat small, frequent meals instead of large meals. SEEK IMMEDIATE MEDICAL CARE IF:   You have black or dark red stools.  You vomit blood or material that looks like coffee grounds.  You are unable to keep fluids down.  Your abdominal pain gets worse.  You have a fever.  You do not feel better after 1 week.  You have any other questions or concerns. MAKE SURE YOU:  Understand these instructions.  Will watch your condition.  Will get help right away if you are not doing  well or get worse.   This information is not intended to replace advice given to you by your health care provider. Make sure you discuss any questions you have with your health care provider.   Document Released: 11/04/2001 Document Revised: 05/11/2012 Document Reviewed: 12/24/2011 Elsevier Interactive Patient Education [employer]

## 2016-06-04 NOTE — ED Notes (Signed)
Pt c/o epigastric pain radiating into mid back and emesis x 4 days and hematemesis(bright red) starting this morning.  Pain score 10/10.  Pt reports taking naproxen w/o relief.

## 2016-06-15 IMAGING — CT CT ANGIO CHEST
2 of 6 series · 19 of 36 positions shown · IV contrast (OMNIPAQUE 350)
Comparison: 06/11/2014

CLINICAL DATA: Left chest pain, shortness of breath since [REDACTED]

EXAM:
CT ANGIOGRAPHY CHEST WITH CONTRAST
TECHNIQUE: Multidetector CT imaging of the chest was performed using the
standard protocol during bolus administration of intravenous
contrast. Multiplanar CT image reconstructions and MIPs were
obtained to evaluate the vascular anatomy.
CONTRAST:  100mL OMNIPAQUE IOHEXOL 350 MG/ML SOLN

[Series 6: thins for pacs · axial · 0.64mm/px · z∈[-233,-4]mm · 18 of 255 slices shown]
[im 13/255  lung]
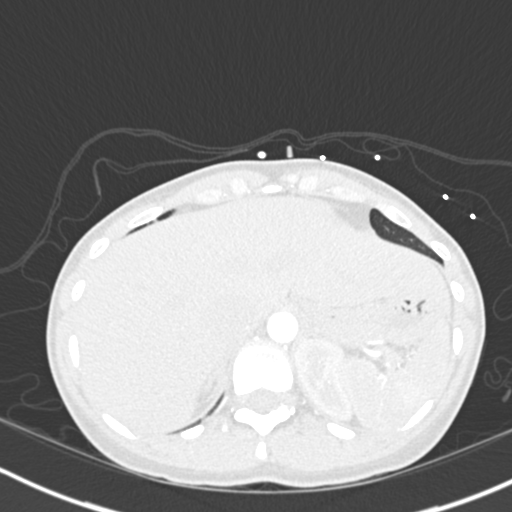
[im 26/255  mediastinal]
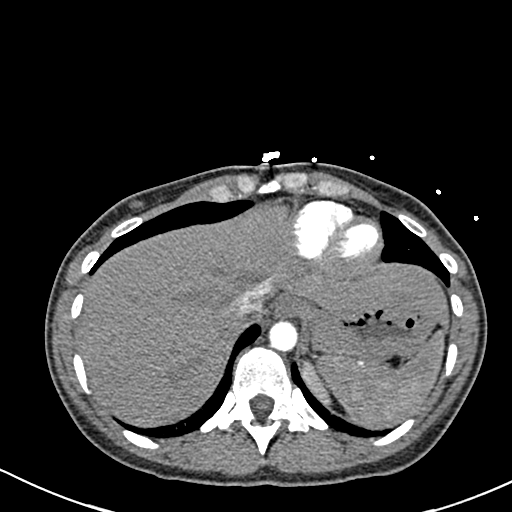
[im 39/255  lung]
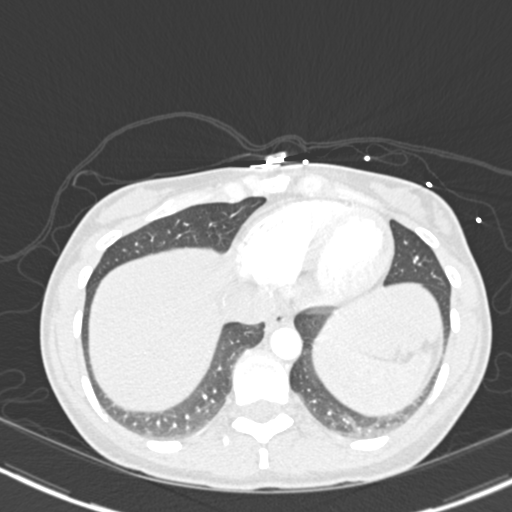
[im 51/255  mediastinal]
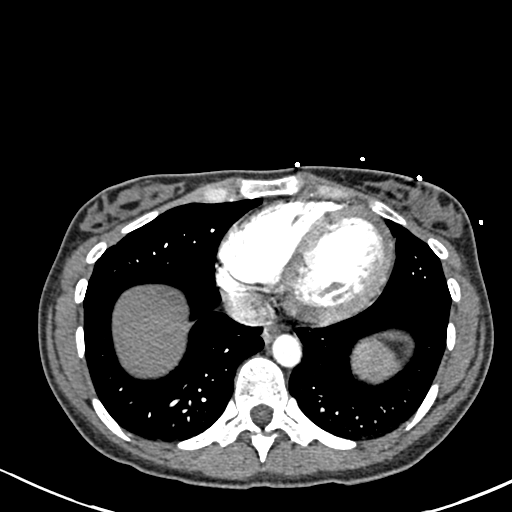
[im 64/255  lung]
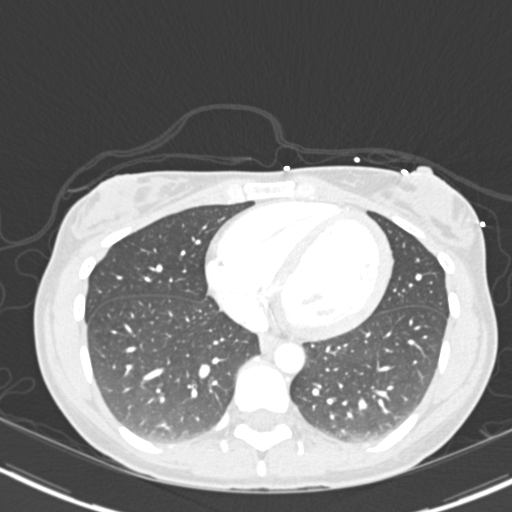
[im 77/255  mediastinal]
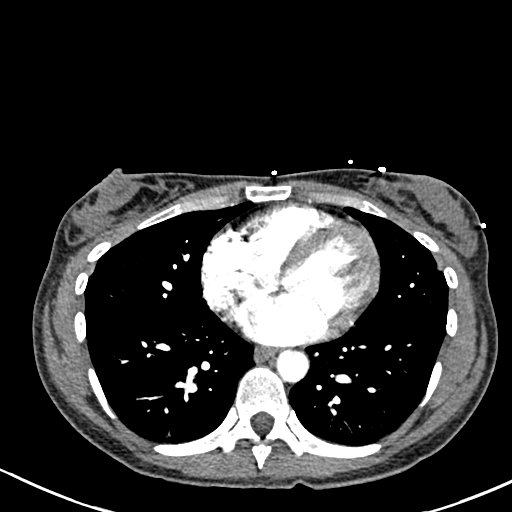
[im 89/255  lung]
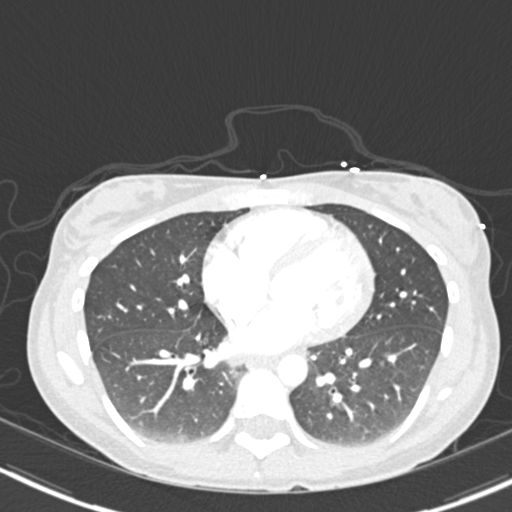
[im 102/255  mediastinal]
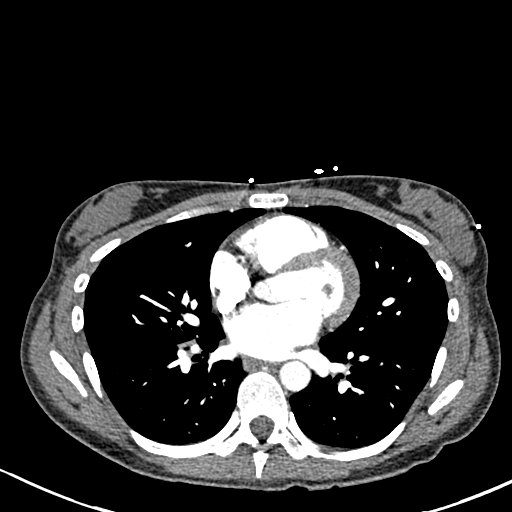
[im 115/255  lung]
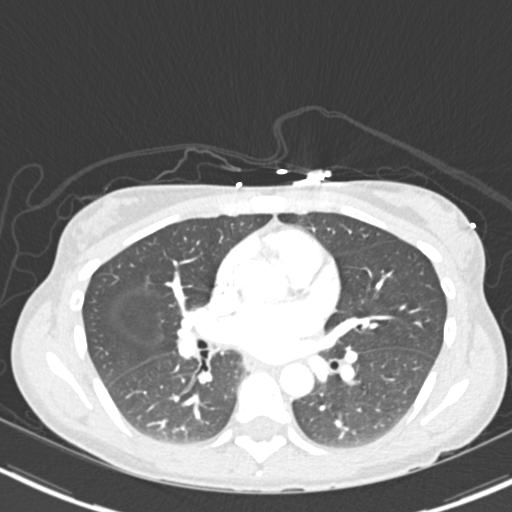
[im 140/255  mediastinal]
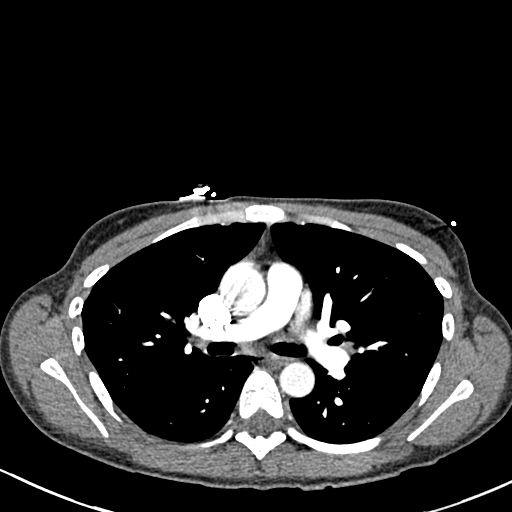
[im 153/255  lung]
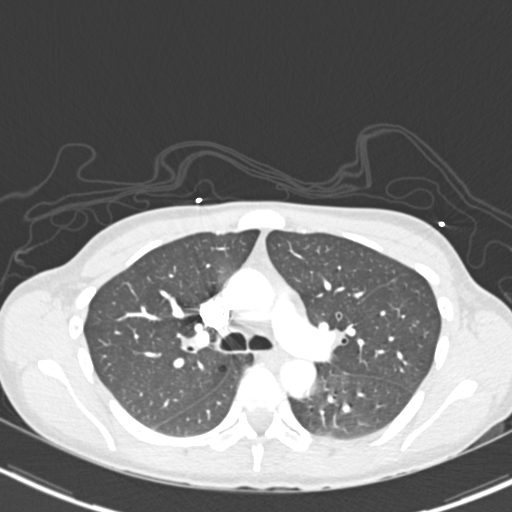
[im 166/255  mediastinal]
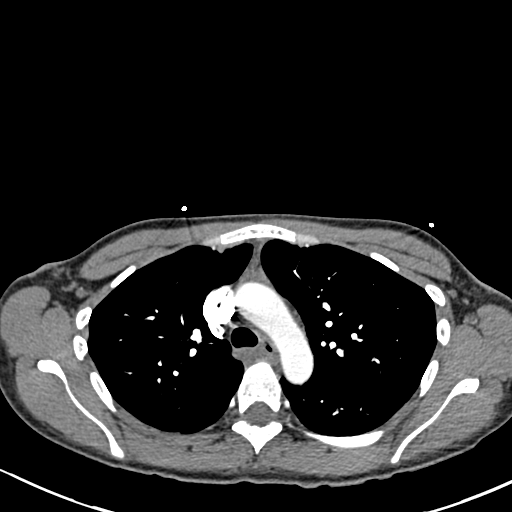
[im 178/255  lung]
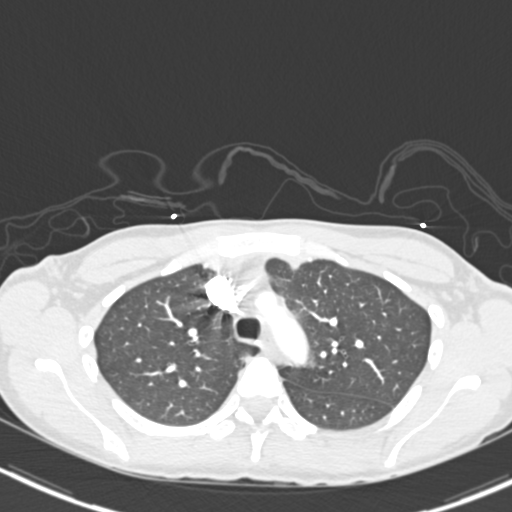
[im 191/255  mediastinal]
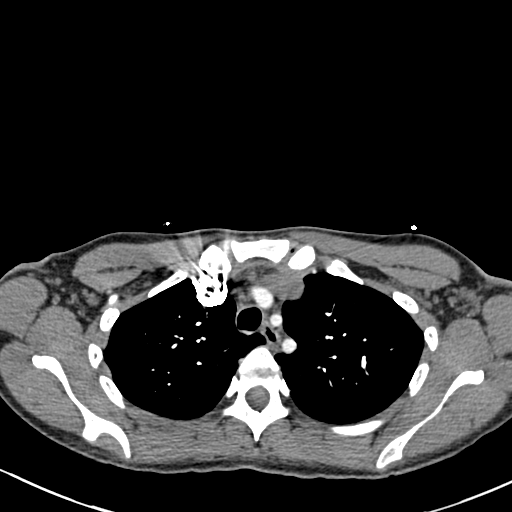
[im 204/255  lung]
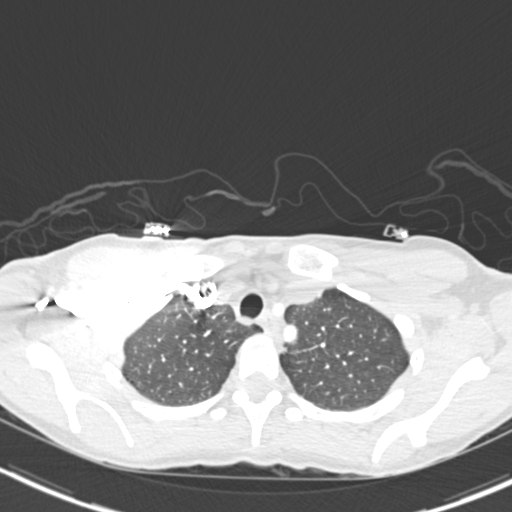
[im 216/255  mediastinal]
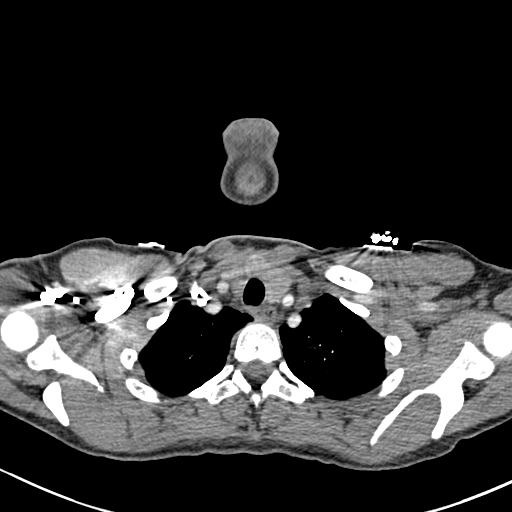
[im 229/255  lung]
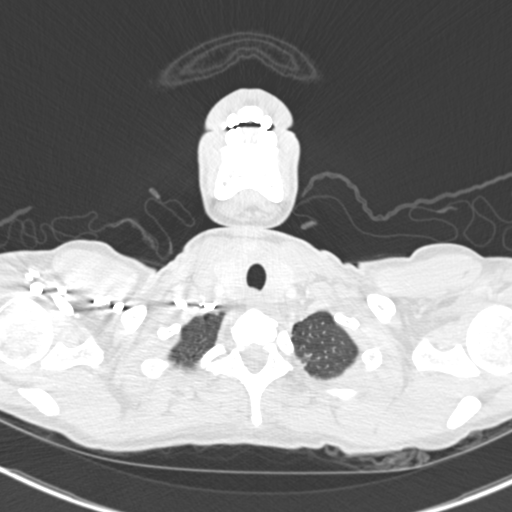
[im 242/255  mediastinal]
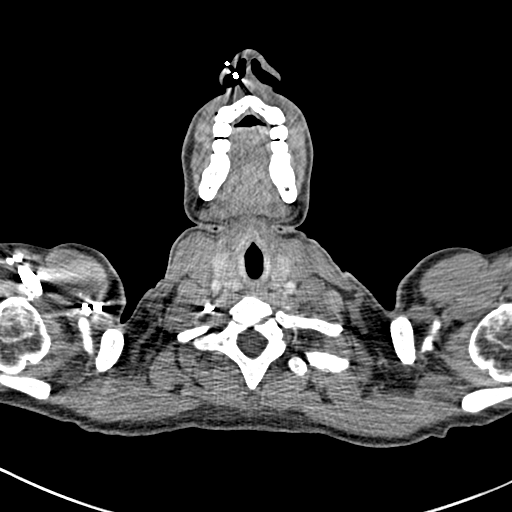

[Series 8: coronal mpr · coronal · 0.49mm/px · 1 of 107 slices shown]
[im 54/107  mediastinal]
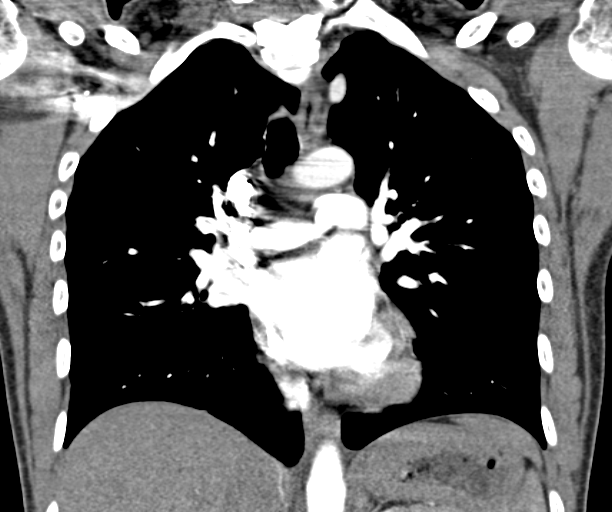

[19 of 36 positions shown; findings below may reference images not displayed]

FINDINGS: Right arm IV contrast injection. SVC patent. Heart size normal. RV
nondilated. Satisfactory opacification of pulmonary arteries noted,
and there is no evidence of pulmonary emboli. Patent pulmonary veins
bilaterally. Adequate contrast opacification of the thoracic aorta
with no evidence of dissection, aneurysm, or stenosis. There is
classic 3-vessel brachiocephalic arch anatomy without proximal
stenosis. No pneumothorax.

No pleural or pericardial effusion. No hilar or mediastinal
adenopathy. Minimal dependent subsegmental atelectasis posteriorly
in both lower lobes. Lungs are clear. Thoracic spine and sternum
intact. Visualized portions of upper abdomen unremarkable.

Review of the MIP images confirms the above findings.
IMPRESSION: Negative.

## 2016-06-15 IMAGING — CR DG CHEST 2V
2 series · 2 of 2 positions shown · non-contrast
Comparison: 04/05/2015.

CLINICAL DATA: LEFT mid chest pain. Short of breath worsening over
the last few hours. Episodes of emesis. Previous smoker.

EXAM:
CHEST  2 VIEW

[w chest pa]
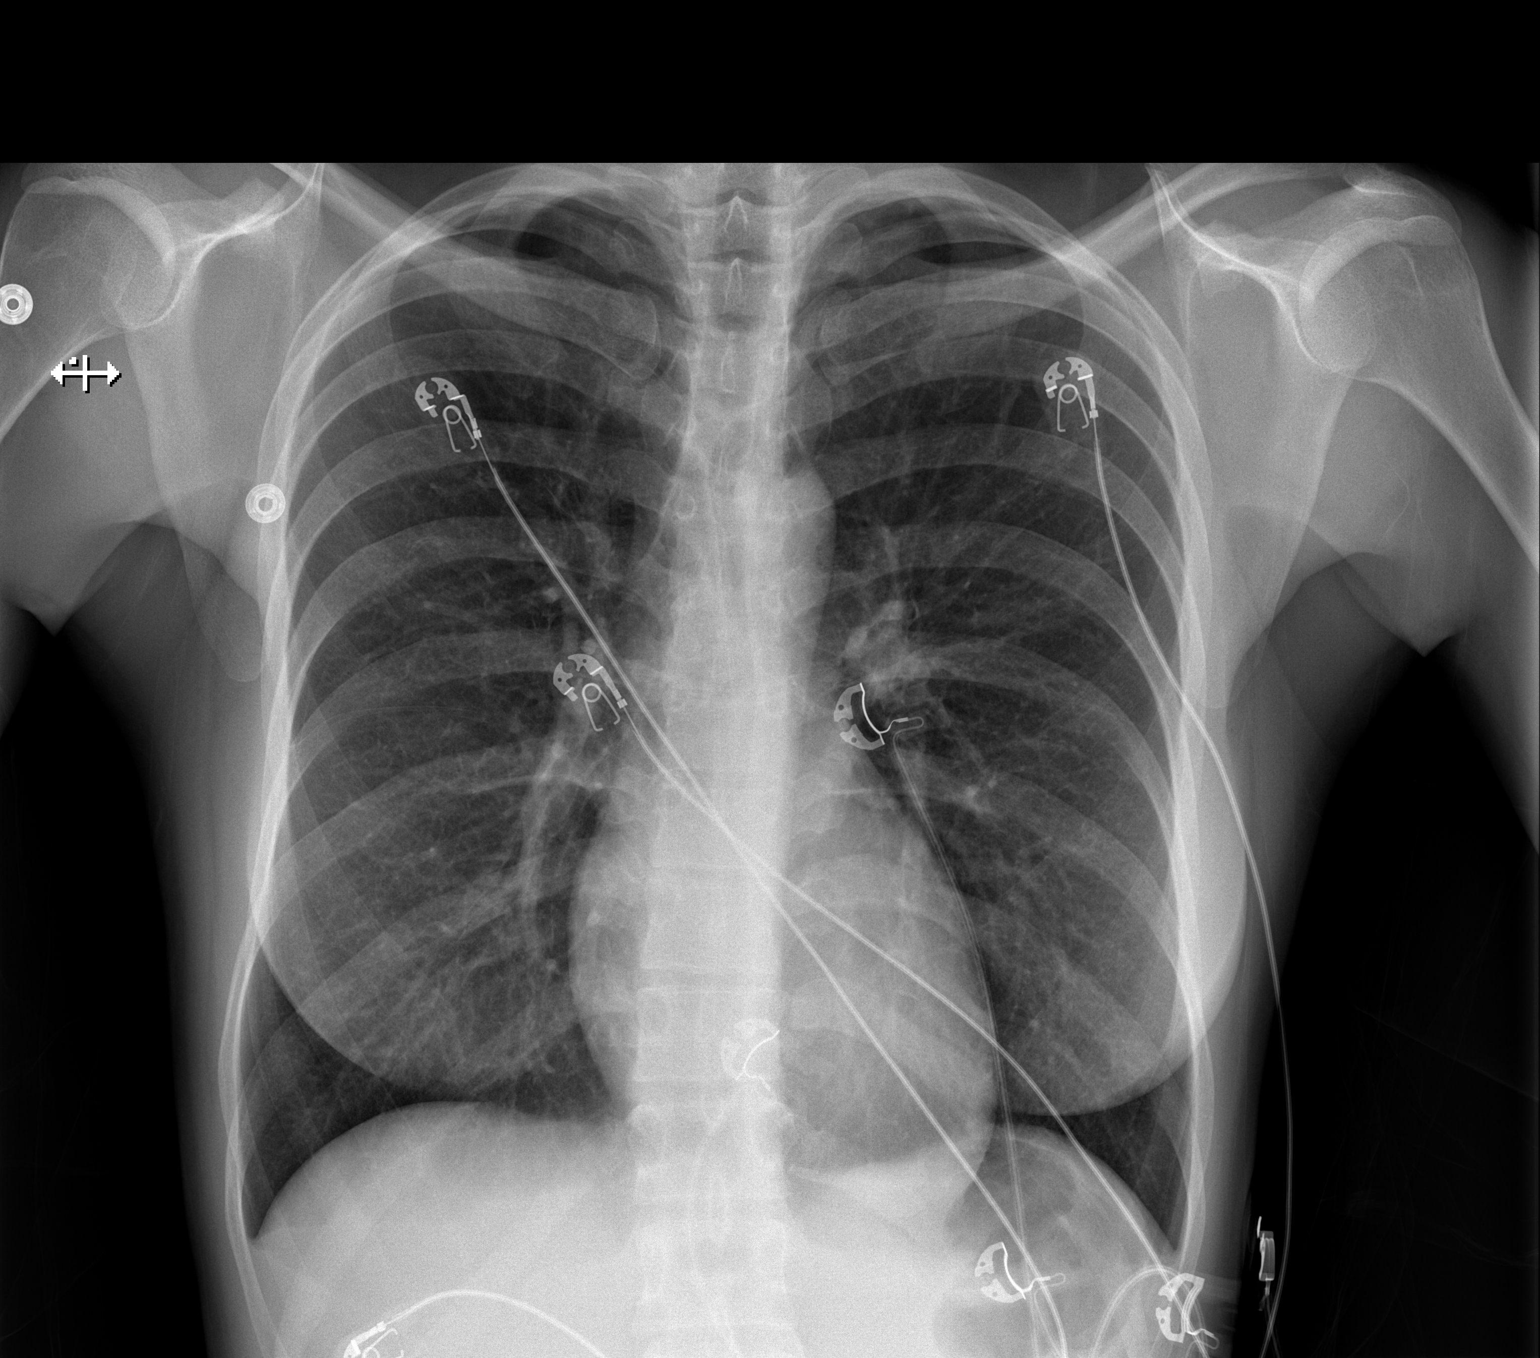

[w chest lat]
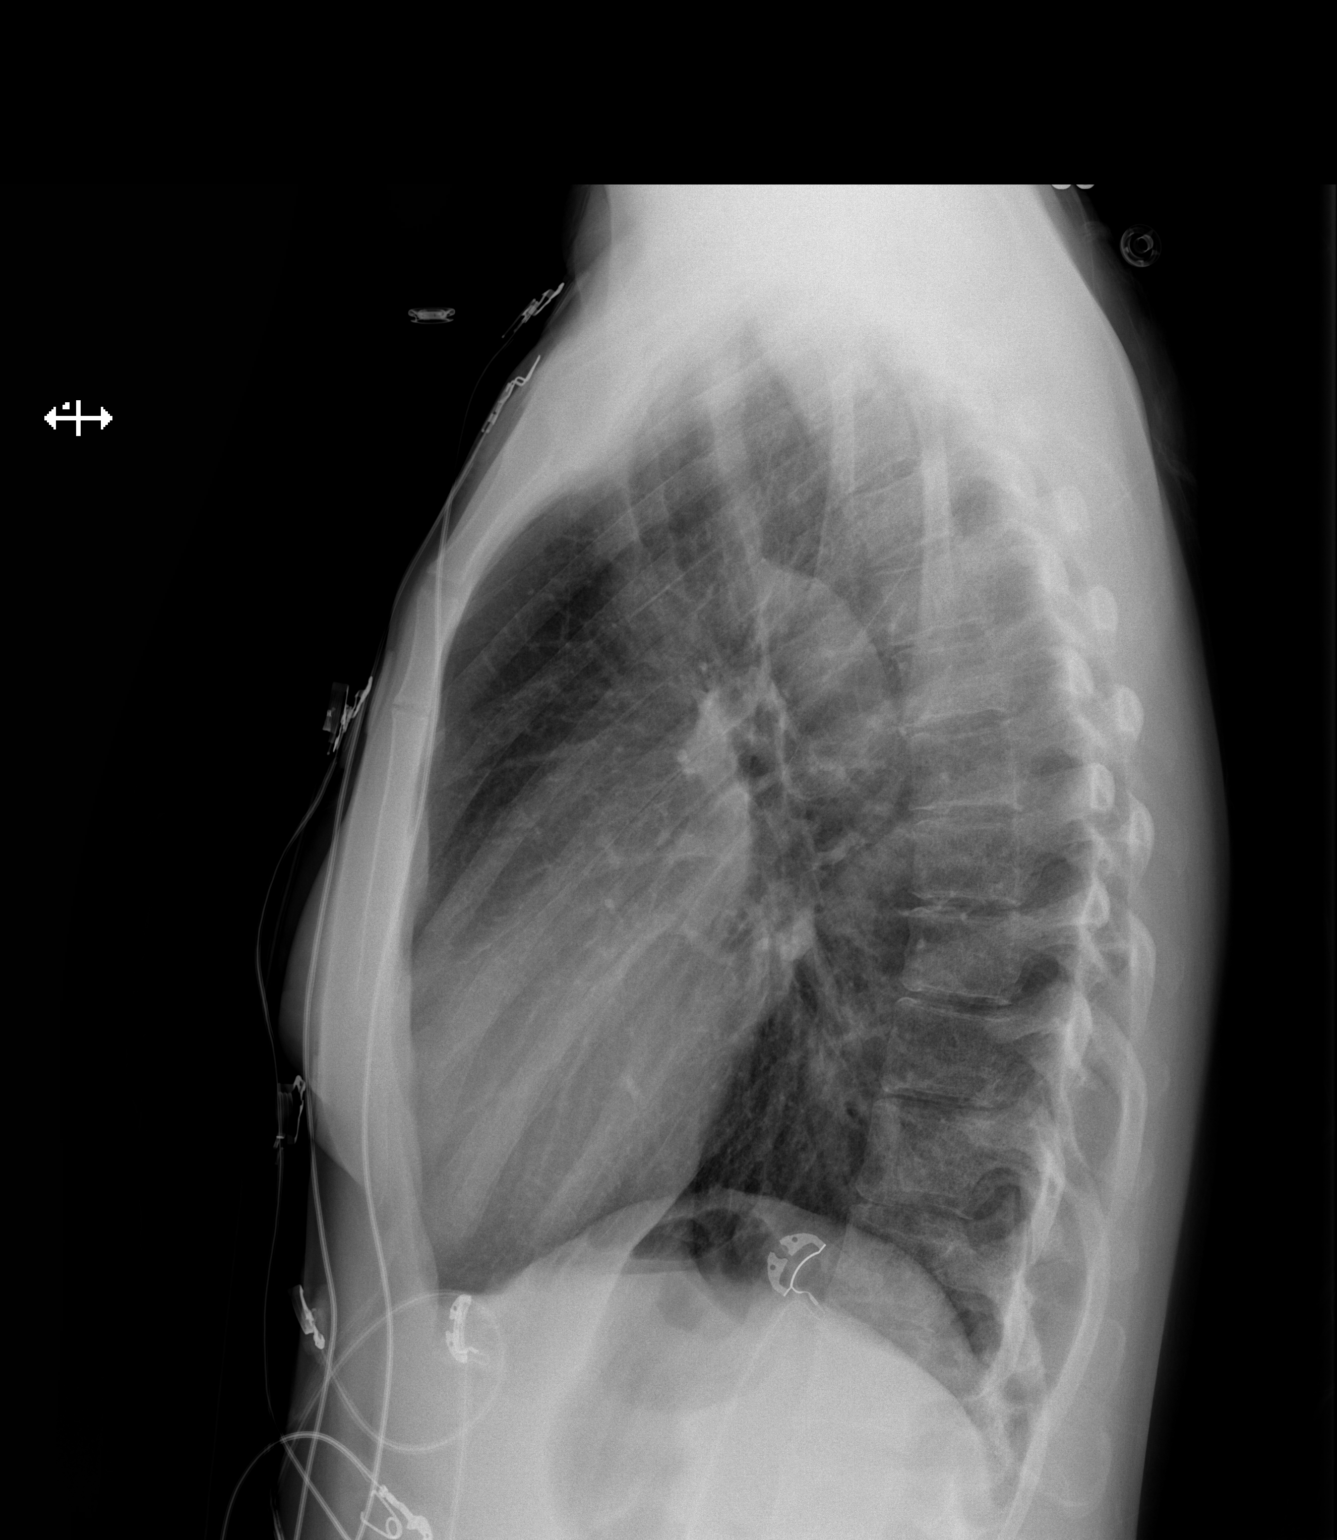

[2 of 2 positions shown; findings below may reference images not displayed]

FINDINGS: Cardiopericardial silhouette within normal limits. Mediastinal
contours normal. Trachea midline. No airspace disease or effusion.
Monitoring leads project over the chest.
IMPRESSION: No active cardiopulmonary disease.

## 2017-02-23 ENCOUNTER — Other Ambulatory Visit (HOSPITAL_COMMUNITY): Payer: Self-pay | Admitting: *Deleted

## 2017-02-23 DIAGNOSIS — N6310 Unspecified lump in the right breast, unspecified quadrant: Secondary | ICD-10-CM

## 2017-03-10 ENCOUNTER — Ambulatory Visit
Admission: RE | Admit: 2017-03-10 | Discharge: 2017-03-10 | Disposition: A | Discharge status: Home / Self Care | Payer: No Typology Code available for payment source | Source: Ambulatory Visit | Attending: Obstetrics and Gynecology | Admitting: Obstetrics and Gynecology

## 2017-03-10 ENCOUNTER — Encounter (HOSPITAL_COMMUNITY): Payer: Self-pay

## 2017-03-10 ENCOUNTER — Ambulatory Visit (HOSPITAL_COMMUNITY)
Admission: RE | Admit: 2017-03-10 | Discharge: 2017-03-10 | Disposition: A | Discharge status: Home / Self Care | Payer: Self-pay | Source: Ambulatory Visit | Attending: Obstetrics and Gynecology | Admitting: Obstetrics and Gynecology

## 2017-03-10 VITALS — BP 102/64 | Temp 98.0°F | Ht 70.0 in | Wt 143.6 lb

## 2017-03-10 DIAGNOSIS — N6310 Unspecified lump in the right breast, unspecified quadrant: Secondary | ICD-10-CM

## 2017-03-10 DIAGNOSIS — Z1239 Encounter for other screening for malignant neoplasm of breast: Secondary | ICD-10-CM

## 2017-03-10 DIAGNOSIS — N644 Mastodynia: Secondary | ICD-10-CM

## 2017-03-10 HISTORY — DX: Unspecified lump in unspecified breast: N63.0

## 2017-03-10 NOTE — Progress Notes (Signed)
Complaints of right breast lump x a few months that has increased in size. Patient complaints of bilateral breast pan. Patient stated the right breast pain is constant and rated at a 5 out of 10. Patient stated the left breast pain comes and goes rating at a 5-6 out of 10.  Pap Smear: Pap smear not completed today. Last Pap smear was 03/13/2015 at [facility] [place] and normal. Per patient has no history of an abnormal Pap smear. Last Pap smear result is in EPIC.  Physical exam: Breasts Breasts symmetrical. No skin abnormalities bilateral breasts. No nipple retraction bilateral breasts. No nipple discharge bilateral breasts. No lymphadenopathy. No lumps palpated left breast. Palpated a lump within the right breast at 11 o'clock in the axillary area 13 cm from the nipple. Complaints of bilateral outer breast pain on exam. Referred patient to the [facility] for diagnostic mammogram and possible bilateral breast ultrasounds. Appointment scheduled for Tuesday, March 10, 2017 at 0920.        Pelvic/Bimanual No Pap smear completed today since last Pap smear was 03/13/2015. Pap smear not indicated per BCCCP guidelines.   Smoking History: Patient has never smoked.  Patient Navigation: Patient education provided. Access to services provided for patient through [facility] program.

## 2017-03-10 NOTE — Patient Instructions (Signed)
Explained breast self awareness with [name]. Patient did not need a Pap smear today due to last Pap smear was 03/13/2015. Let her know BCCCP will cover Pap smears every 3 years unless has a history of abnormal Pap smears. Referred patient to the [facility] for diagnostic mammogram and possible bilateral breast ultrasounds. Appointment scheduled for Tuesday, March 10, 2017 at 0920. [name] verbalized understanding.  [name], Arvil Chaco, RN 12:36 PM

## 2017-03-13 ENCOUNTER — Encounter (HOSPITAL_COMMUNITY): Payer: Self-pay | Admitting: *Deleted

## 2017-03-17 ENCOUNTER — Emergency Department (HOSPITAL_COMMUNITY): Payer: No Typology Code available for payment source

## 2017-03-17 ENCOUNTER — Encounter (HOSPITAL_COMMUNITY): Payer: Self-pay

## 2017-03-17 ENCOUNTER — Emergency Department (HOSPITAL_COMMUNITY)
Admission: EM | Admit: 2017-03-17 | Discharge: 2017-03-17 | Disposition: A | Discharge status: Home / Self Care | Payer: No Typology Code available for payment source | Attending: Emergency Medicine | Admitting: Emergency Medicine

## 2017-03-17 ENCOUNTER — Telehealth: Payer: Self-pay | Admitting: Neurology

## 2017-03-17 DIAGNOSIS — Z87891 Personal history of nicotine dependence: Secondary | ICD-10-CM | POA: Insufficient documentation

## 2017-03-17 DIAGNOSIS — G43109 Migraine with aura, not intractable, without status migrainosus: Secondary | ICD-10-CM | POA: Insufficient documentation

## 2017-03-17 DIAGNOSIS — R51 Headache: Secondary | ICD-10-CM

## 2017-03-17 DIAGNOSIS — R519 Headache, unspecified: Secondary | ICD-10-CM

## 2017-03-17 DIAGNOSIS — I10 Essential (primary) hypertension: Secondary | ICD-10-CM | POA: Insufficient documentation

## 2017-03-17 LAB — COMPREHENSIVE METABOLIC PANEL
ALT: 9 U/L — ABNORMAL LOW (ref 14–54)
AST: 17 U/L (ref 15–41)
Albumin: 4.2 g/dL (ref 3.5–5.0)
Alkaline Phosphatase: 67 U/L (ref 38–126)
Anion gap: 4 — ABNORMAL LOW (ref 5–15)
BUN: 11 mg/dL (ref 6–20)
CO2: 30 mmol/L (ref 22–32)
Calcium: 9 mg/dL (ref 8.9–10.3)
Chloride: 107 mmol/L (ref 101–111)
Creatinine, Ser: 0.71 mg/dL (ref 0.44–1.00)
GFR calc Af Amer: >60 mL/min (ref >60)
GFR calc non Af Amer: >60 mL/min (ref >60)
Glucose, Bld: 84 mg/dL (ref 65–99)
Potassium: 3.5 mmol/L (ref 3.5–5.1)
Sodium: 141 mmol/L (ref 135–145)
Total Bilirubin: 0.7 mg/dL (ref 0.3–1.2)
Total Protein: 7.1 g/dL (ref 6.5–8.1)

## 2017-03-17 LAB — CBC
HCT: 36 % (ref 36.0–46.0)
Hemoglobin: 13 g/dL (ref 12.0–15.0)
MCH: 31.7 pg (ref 26.0–34.0)
MCHC: 36.1 g/dL — ABNORMAL HIGH (ref 30.0–36.0)
MCV: 87.8 fL (ref 78.0–100.0)
Platelets: 246 10*3/uL (ref 150–400)
RBC: 4.1 MIL/uL (ref 3.87–5.11)
RDW: 12.2 % (ref 11.5–15.5)
WBC: 5 10*3/uL (ref 4.0–10.5)

## 2017-03-17 LAB — I-STAT BETA HCG BLOOD, ED (MC, WL, AP ONLY): I-stat hCG, quantitative: <5 m[IU]/mL (ref <5)

## 2017-03-17 LAB — LIPASE, BLOOD: Lipase: 19 U/L (ref 11–51)

## 2017-03-17 MED ORDER — SODIUM CHLORIDE 0.9 % IV BOLUS (SEPSIS)
1000.0000 mL | Freq: Once | INTRAVENOUS | Status: AC
  Administered 2017-03-17: 1000 mL via INTRAVENOUS

## 2017-03-17 MED ORDER — DIPHENHYDRAMINE HCL 50 MG/ML IJ SOLN
25.0000 mg | Freq: Once | INTRAMUSCULAR | Status: AC
  Administered 2017-03-17: 25 mg via INTRAVENOUS
  Filled 2017-03-17: qty 1

## 2017-03-17 MED ORDER — ONDANSETRON 4 MG PO TBDP
4.0000 mg | ORAL_TABLET | Freq: Once | ORAL | Status: AC | PRN
  Administered 2017-03-17: 4 mg via ORAL
  Filled 2017-03-17: qty 1

## 2017-03-17 MED ORDER — PROCHLORPERAZINE EDISYLATE 5 MG/ML IJ SOLN
10.0000 mg | Freq: Once | INTRAMUSCULAR | Status: AC
  Administered 2017-03-17: 10 mg via INTRAVENOUS
  Filled 2017-03-17: qty 2

## 2017-03-17 MED ORDER — IOPAMIDOL (ISOVUE-370) INJECTION 76%
INTRAVENOUS | Status: AC
  Administered 2017-03-17: 75 mL via INTRAVENOUS
  Filled 2017-03-17: qty 100

## 2017-03-17 NOTE — Telephone Encounter (Signed)
Patient has been scheduled to see Cecille Rubin 04/15/17 fu headaches patient last seen Dr. Jaynee Eagles 12/2014.

## 2017-03-17 NOTE — ED Notes (Signed)
Patient transported to CT 
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
## 2017-03-17 NOTE — ED Triage Notes (Signed)
Patient c/o headache( head pressure), RLQ pain , N/V/D and and bilateral leg weakness. Patient reports that she has been seeing black and white spots since yesterday. Patient reports sensitivity to light and sound. Patient denies dizziness.

## 2017-03-17 NOTE — Discharge Instructions (Signed)
Follow up with your PCP and neurologist.  °

## 2017-04-03 ENCOUNTER — Ambulatory Visit (INDEPENDENT_AMBULATORY_CARE_PROVIDER_SITE_OTHER): Payer: No Typology Code available for payment source | Admitting: Obstetrics & Gynecology

## 2017-04-03 ENCOUNTER — Encounter: Payer: Self-pay | Admitting: Obstetrics & Gynecology

## 2017-04-03 VITALS — BP 125/81 | HR 102 | Wt 145.1 lb

## 2017-04-03 DIAGNOSIS — R1031 Right lower quadrant pain: Secondary | ICD-10-CM

## 2017-04-03 NOTE — Patient Instructions (Signed)

## 2017-04-03 NOTE — Progress Notes (Signed)
Patient PHQ-9 23, GAD-7 19, answered question #9 with a 1.  Patient stated that she has an appointment at [facility] today. Jamie/Dr Roselie Awkward aware.

## 2017-04-03 NOTE — Progress Notes (Signed)
Patient ID: [name], female   DOB: June 30, 1986, 31 y.o.   MRN: [id number]  Cc: long standing RLQ pain  HPI [name] is a 31 y.o. female.  G1P0010 Patient's last menstrual period was 02/26/2017 (approximate). Not sexually active for many years, with RLQ pain for at least 4 years which is worsening, with tenderness and pain radiating to her right thigh. [place] of pelvis has been normal in the past HPI  Past Medical History:  Diagnosis Date  . Arrhythmia Dx 2015  . Back pain   . Breast mass   . Depression Dx 2000  . Fibromyalgia   . Heart murmur Dx 2015  . 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 Hypertension Dx 27035  . Lumbar radiculopathy   . Panic attack Dx 2006  . PTSD (post-traumatic stress disorder) Dx 2013  . Seizures ([place]) 2001  . Vitamin D deficiency 01/24/2015    Past Surgical History:  Procedure Laterality Date  . ESOPHAGOGASTRODUODENOSCOPY N/A 08/14/2015   Procedure: ESOPHAGOGASTRODUODENOSCOPY (EGD);  Surgeon: Carol Ada, MD;  Location: [facility];  Service: Endoscopy;  Laterality: N/A;    Family History  Problem Relation Age of Onset  . Heart attack Mother   . Heart disease Mother   . Sudden death Mother   . Sudden death Sister   . Heart attack Sister   . Sudden death Maternal Grandmother   . Fainting Maternal Grandmother   . Heart attack Maternal Grandmother   . Breast cancer Paternal Grandmother   . Breast cancer Cousin     Social History Social History  Substance Use Topics  . Smoking status: Former Smoker    Quit date: 06/23/2012  . Smokeless tobacco: Never Used  . Alcohol use Yes     Comment: socially    Allergies  Allergen Reactions  . Aspirin Hives    Current Outpatient Prescriptions  Medication Sig Dispense Refill  . famotidine (PEPCID) 20 MG tablet Take 1 tablet (20 mg total) by mouth 2 (two) times daily. 30 tablet 0  . gabapentin (NEURONTIN) 300 MG capsule Take 300 mg by mouth 3 (three) times daily.     .  sertraline (ZOLOFT) 25 MG tablet Take 25 mg by mouth daily.    . Alum & Mag Hydroxide-Simeth (GI COCKTAIL) SUSP suspension Take 30 mLs by mouth 2 (two) times daily as needed for indigestion. Shake well.    . docusate sodium (COLACE) 100 MG capsule Take 100 mg by mouth 2 (two) times daily as needed for mild constipation.    [name] doxycycline (VIBRAMYCIN) 100 MG capsule Take 1 capsule (100 mg total) by mouth 2 (two) times daily. (Patient not taking: Reported on 06/04/2016) 14 capsule 0  . DULoxetine (CYMBALTA) 20 MG capsule Take 1 capsule (20 mg total) by mouth daily. (Patient not taking: Reported on 03/10/2017) 30 capsule 2  . fluticasone (FLONASE) 50 MCG/ACT nasal spray Place 1 spray into both nostrils daily as needed for allergies or rhinitis.     . metoprolol succinate (TOPROL-XL) 25 MG 24 hr tablet Take 25 mg by mouth daily.    . naproxen (NAPROSYN) 500 MG tablet Take 1 tablet (500 mg total) by mouth 2 (two) times daily as needed for mild pain, moderate pain or headache (TAKE WITH MEALS.). (Patient not taking: Reported on 03/10/2017) 20 tablet 0  . omeprazole (PRILOSEC) 40 MG capsule Take 1 capsule (40 mg total) by mouth daily. (Patient not taking: Reported on 03/10/2017) 30 capsule 3  .  ondansetron (ZOFRAN ODT) 4 MG disintegrating tablet Take 1 tablet (4 mg total) by mouth every 8 (eight) hours as needed for nausea or vomiting. (Patient not taking: Reported on 03/10/2017) 20 tablet 0  . ondansetron (ZOFRAN) 4 MG tablet Take 1 tablet (4 mg total) by mouth every 8 (eight) hours as needed for nausea or vomiting. (Patient not taking: Reported on 03/10/2017) 10 tablet 0  . oxyCODONE-acetaminophen (PERCOCET/ROXICET) 5-325 MG tablet Take 1 tablet by mouth every 6 (six) hours as needed for severe pain. (Patient not taking: Reported on 03/10/2017) 12 tablet 0  . polyethylene glycol (MIRALAX / GLYCOLAX) packet Take 17 g by mouth daily as needed for mild constipation.    . pregabalin (LYRICA) 75 MG capsule Take 1 capsule  (75 mg total) by mouth 3 (three) times daily. (Patient not taking: Reported on 03/10/2017) 90 capsule 6  . promethazine (PHENERGAN) 25 MG tablet Take 25 mg by mouth every 6 (six) hours as needed for nausea or vomiting.    . propranolol ER (INDERAL LA) 120 MG 24 hr capsule Take 1 capsule (120 mg total) by mouth daily. (Patient not taking: Reported on 03/10/2017) 30 capsule 6  . sucralfate (CARAFATE) 1 GM/10ML suspension Take 10 mLs (1 g total) by mouth 4 (four) times daily -  with meals and at bedtime. (Patient not taking: Reported on 03/10/2017) 420 mL 0   No current facility-administered medications for this visit.     Review of Systems Review of Systems  Constitutional: Negative.   Gastrointestinal: Positive for abdominal distention, abdominal pain and nausea.  Genitourinary: Negative for pelvic pain, vaginal bleeding and vaginal discharge.  Neurological: Positive for headaches.    Blood pressure 125/81, pulse (!) 102, weight 145 lb 1.6 oz (65.8 kg), last menstrual period 02/26/2017.  Physical Exam Physical Exam  Constitutional: She appears well-developed. No distress.  Cardiovascular: Normal rate.   Pulmonary/Chest: Effort normal.  Abdominal: She exhibits no mass. There is tenderness (lower quadrants).  Genitourinary: Vagina normal and uterus normal. No vaginal discharge found.  Genitourinary Comments: No mass  Neurological: She is alert.  Skin: Skin is warm and dry.  Psychiatric: She has a normal mood and affect.    Data Reviewed [place] results and ED notes  Assessment    RLQ pain chronic likely not Gyn origin    Plan    CT abdomen and pelvis f/u at [facility] is already scheduled       [name] 04/03/2017, 11:01 AM

## 2017-04-06 ENCOUNTER — Other Ambulatory Visit: Payer: Self-pay

## 2017-04-06 DIAGNOSIS — R1031 Right lower quadrant pain: Secondary | ICD-10-CM

## 2017-04-06 NOTE — Progress Notes (Signed)
Received a call from CT/MRI scheduling requesting that the order for the pt's CT scan be changed from w w/o contrast to just with contrast.  Ordered changed according to recommendation.

## 2017-04-07 ENCOUNTER — Ambulatory Visit (HOSPITAL_COMMUNITY)
Admission: RE | Admit: 2017-04-07 | Discharge: 2017-04-07 | Disposition: A | Discharge status: Home / Self Care | Payer: No Typology Code available for payment source | Source: Ambulatory Visit | Attending: Obstetrics & Gynecology | Admitting: Obstetrics & Gynecology

## 2017-04-07 ENCOUNTER — Encounter (HOSPITAL_COMMUNITY): Payer: Self-pay

## 2017-04-07 DIAGNOSIS — R1031 Right lower quadrant pain: Secondary | ICD-10-CM | POA: Insufficient documentation

## 2017-04-07 MED ORDER — IOPAMIDOL (ISOVUE-300) INJECTION 61%
INTRAVENOUS | Status: AC
  Filled 2017-04-07: qty 75

## 2017-04-07 MED ORDER — IOPAMIDOL (ISOVUE-300) INJECTION 61%
100.0000 mL | Freq: Once | INTRAVENOUS | Status: AC | PRN
  Administered 2017-04-07: 75 mL via INTRAVENOUS

## 2017-04-08 ENCOUNTER — Encounter: Payer: Self-pay | Admitting: Family Medicine

## 2017-04-08 ENCOUNTER — Ambulatory Visit: Payer: Self-pay | Attending: Family Medicine | Admitting: Family Medicine

## 2017-04-08 VITALS — BP 133/85 | HR 92 | Temp 98.2°F | Wt 152.0 lb

## 2017-04-08 DIAGNOSIS — J302 Other seasonal allergic rhinitis: Secondary | ICD-10-CM | POA: Insufficient documentation

## 2017-04-08 DIAGNOSIS — I10 Essential (primary) hypertension: Secondary | ICD-10-CM | POA: Insufficient documentation

## 2017-04-08 DIAGNOSIS — Z79899 Other long term (current) drug therapy: Secondary | ICD-10-CM | POA: Insufficient documentation

## 2017-04-08 DIAGNOSIS — R222 Localized swelling, mass and lump, trunk: Secondary | ICD-10-CM | POA: Insufficient documentation

## 2017-04-08 DIAGNOSIS — F4310 Post-traumatic stress disorder, unspecified: Secondary | ICD-10-CM | POA: Insufficient documentation

## 2017-04-08 DIAGNOSIS — M792 Neuralgia and neuritis, unspecified: Secondary | ICD-10-CM | POA: Insufficient documentation

## 2017-04-08 DIAGNOSIS — R102 Pelvic and perineal pain: Secondary | ICD-10-CM | POA: Insufficient documentation

## 2017-04-08 DIAGNOSIS — R2231 Localized swelling, mass and lump, right upper limb: Secondary | ICD-10-CM

## 2017-04-08 DIAGNOSIS — R05 Cough: Secondary | ICD-10-CM

## 2017-04-08 DIAGNOSIS — F329 Major depressive disorder, single episode, unspecified: Secondary | ICD-10-CM | POA: Insufficient documentation

## 2017-04-08 DIAGNOSIS — M940 Chondrocostal junction syndrome [Tietze]: Secondary | ICD-10-CM | POA: Insufficient documentation

## 2017-04-08 DIAGNOSIS — R059 Cough, unspecified: Secondary | ICD-10-CM

## 2017-04-08 MED ORDER — MELOXICAM 7.5 MG PO TABS
7.5000 mg | ORAL_TABLET | Freq: Every day | ORAL | 1 refills | Status: DC
Start: 2017-04-08 — End: 2017-08-19

## 2017-04-08 MED ORDER — CETIRIZINE HCL 10 MG PO TABS: 10.0000 mg | ORAL_TABLET | Freq: Every day | ORAL | 1 refills | Status: DC

## 2017-04-08 NOTE — Patient Instructions (Signed)
Costochondritis Costochondritis is swelling and irritation (inflammation) of the tissue (cartilage) that connects your ribs to your breastbone (sternum). This causes pain in the front of your chest. The pain usually starts gradually and involves more than one rib. What are the causes? The exact cause of this condition is not always known. It results from stress on the cartilage where your ribs attach to your sternum. The cause of this stress could be:  Chest injury (trauma).  Exercise or activity, such as lifting.  Severe coughing. What increases the risk? You may be at higher risk for this condition if you:  Are female.  Are 30?32 years old.  Recently started a new exercise or work activity.  Have low levels of vitamin D.  Have a condition that makes you cough frequently. What are the signs or symptoms? The main symptom of this condition is chest pain. The pain:  Usually starts gradually and can be sharp or dull.  Gets worse with deep breathing, coughing, or exercise.  Gets better with rest.  May be worse when you press on the sternum-rib connection (tenderness). How is this diagnosed? This condition is diagnosed based on your symptoms, medical history, and a physical exam. Your health care provider will check for tenderness when pressing on your sternum. This is the most important finding. You may also have tests to rule out other causes of chest pain. These may include:  A chest X-ray to check for lung problems.  An electrocardiogram (ECG) to see if you have a heart problem that could be causing the pain.  An imaging scan to rule out a chest or rib fracture. How is this treated? This condition usually goes away on its own over time. Your health care provider may prescribe an NSAID to reduce pain and inflammation. Your health care provider may also suggest that you:  Rest and avoid activities that make pain worse.  Apply heat or cold to the area to reduce pain and  inflammation.  Do exercises to stretch your chest muscles. If these treatments do not help, your health care provider may inject a numbing medicine at the sternum-rib connection to help relieve the pain. Follow these instructions at home:  Avoid activities that make pain worse. This includes any activities that use chest, abdominal, and side muscles.  If directed, put ice on the painful area:  Put ice in a plastic bag.  Place a towel between your skin and the bag.  Leave the ice on for 20 minutes, 2-3 times a day.  If directed, apply heat to the affected area as often as told by your health care provider. Use the heat source that your health care provider recommends, such as a moist heat pack or a heating pad.  Place a towel between your skin and the heat source.  Leave the heat on for 20-30 minutes.  Remove the heat if your skin turns bright red. This is especially important if you are unable to feel pain, heat, or cold. You may have a greater risk of getting burned.  Take over-the-counter and prescription medicines only as told by your health care provider.  Return to your normal activities as told by your health care provider. Ask your health care provider what activities are safe for you.  Keep all follow-up visits as told by your health care provider. This is important. Contact a health care provider if:  You have chills or a fever.  Your pain does not go away or it gets worse.    You have a cough that does not go away (is persistent). Get help right away if:  You have shortness of breath. This information is not intended to replace advice given to you by your health care provider. Make sure you discuss any questions you have with your health care provider. Document Released: 08/20/2005 Document Revised: 05/30/2016 Document Reviewed: 03/05/2016 Elsevier Interactive Patient Education  2017 Elsevier Inc.  

## 2017-04-08 NOTE — Progress Notes (Signed)
Subjective:  Patient ID: [name], female    DOB: [birth year]  Age: 31 y.o. MRN: [id number]  CC: Here to establish care  HPI [name] is a 31 year old female with a history of hypertension, PTSD, anxiety and depression who presents today to establish care with me.  She presents complaining of bilateral pelvic pain worse on the right which has been present for several years and radiates to her inguinal region and down her thighs with occasional numbness in her thighs. Has intermittent nausea and vomiting. Seen by Dr. Roselie Awkward of GYN 5 days ago and CT abdomen and pelvis revealed no acute abnormality and pain thought to be not of GYN origin. She rates pain as a 10/10 at this time and this is associated with her legs giving out from under her and "a hot sensation" in legs. Not sexually active 15 years.  She also complains of right upper chest wall pain - cardiology notes reviewed; no cardiac etiology. Mammogram and right breast ultrasound from 02/2017 revealed likely benign 7 mm right axillary mass, six-month follow-up recommended to ensure stability.   She complains of cough associated with production of whitish to yellowish sputum for the last couple of months and a sensation of something stuck in her throat.  Past Medical History:  Diagnosis Date  . Arrhythmia Dx 2015  . Back pain   . Breast mass   . Depression Dx 2000  . Fibromyalgia   . Heart murmur Dx 2015  . 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 Hypertension Dx 98[address]  . Lumbar radiculopathy   . Panic attack Dx 2006  . PTSD (post-traumatic stress disorder) Dx 2013  . Seizures ([place]) 2001  . Vitamin D deficiency 01/24/2015    Past Surgical History:  Procedure Laterality Date  . ESOPHAGOGASTRODUODENOSCOPY N/A 08/14/2015   Procedure: ESOPHAGOGASTRODUODENOSCOPY (EGD);  Surgeon: Carol Ada, MD;  Location: [facility];  Service: Endoscopy;  Laterality: N/A;    Allergies  Allergen Reactions  . Aspirin  Hives     Outpatient Medications Prior to Visit  Medication Sig Dispense Refill  . fluticasone (FLONASE) 50 MCG/ACT nasal spray Place 1 spray into both nostrils daily as needed for allergies or rhinitis.     Marland Kitchen gabapentin (NEURONTIN) 300 MG capsule Take 300 mg by mouth 3 (three) times daily.     . ondansetron (ZOFRAN ODT) 4 MG disintegrating tablet Take 1 tablet (4 mg total) by mouth every 8 (eight) hours as needed for nausea or vomiting. 20 tablet 0  . ondansetron (ZOFRAN) 4 MG tablet Take 1 tablet (4 mg total) by mouth every 8 (eight) hours as needed for nausea or vomiting. 10 tablet 0  . promethazine (PHENERGAN) 25 MG tablet Take 25 mg by mouth every 6 (six) hours as needed for nausea or vomiting.    . sertraline (ZOLOFT) 25 MG tablet Take 25 mg by mouth daily.    . Alum & Mag Hydroxide-Simeth (GI COCKTAIL) SUSP suspension Take 30 mLs by mouth 2 (two) times daily as needed for indigestion. Shake well.    . docusate sodium (COLACE) 100 MG capsule Take 100 mg by mouth 2 (two) times daily as needed for mild constipation.    . DULoxetine (CYMBALTA) 20 MG capsule Take 1 capsule (20 mg total) by mouth daily. (Patient not taking: Reported on 03/10/2017) 30 capsule 2  . metoprolol succinate (TOPROL-XL) 25 MG 24 hr tablet Take 25 mg by mouth daily.    Marland Kitchen omeprazole (PRILOSEC) 40  MG capsule Take 1 capsule (40 mg total) by mouth daily. (Patient not taking: Reported on 03/10/2017) 30 capsule 3  . polyethylene glycol (MIRALAX / GLYCOLAX) packet Take 17 g by mouth daily as needed for mild constipation.    . propranolol ER (INDERAL LA) 120 MG 24 hr capsule Take 1 capsule (120 mg total) by mouth daily. (Patient not taking: Reported on 03/10/2017) 30 capsule 6  . doxycycline (VIBRAMYCIN) 100 MG capsule Take 1 capsule (100 mg total) by mouth 2 (two) times daily. (Patient not taking: Reported on 06/04/2016) 14 capsule 0  . famotidine (PEPCID) 20 MG tablet Take 1 tablet (20 mg total) by mouth 2 (two) times daily.  (Patient not taking: Reported on 04/08/2017) 30 tablet 0  . naproxen (NAPROSYN) 500 MG tablet Take 1 tablet (500 mg total) by mouth 2 (two) times daily as needed for mild pain, moderate pain or headache (TAKE WITH MEALS.). (Patient not taking: Reported on 03/10/2017) 20 tablet 0  . oxyCODONE-acetaminophen (PERCOCET/ROXICET) 5-325 MG tablet Take 1 tablet by mouth every 6 (six) hours as needed for severe pain. (Patient not taking: Reported on 03/10/2017) 12 tablet 0  . pregabalin (LYRICA) 75 MG capsule Take 1 capsule (75 mg total) by mouth 3 (three) times daily. (Patient not taking: Reported on 03/10/2017) 90 capsule 6  . sucralfate (CARAFATE) 1 GM/10ML suspension Take 10 mLs (1 g total) by mouth 4 (four) times daily -  with meals and at bedtime. (Patient not taking: Reported on 03/10/2017) 420 mL 0   No facility-administered medications prior to visit.     ROS Review of Systems  Constitutional: Negative for activity change, appetite change and fatigue.  HENT: Negative for congestion, sinus pressure and sore throat.   Eyes: Negative for visual disturbance.  Respiratory: Positive for cough. Negative for chest tightness, shortness of breath and wheezing.   Cardiovascular: Positive for chest pain. Negative for palpitations.  Gastrointestinal: Positive for abdominal pain, nausea and vomiting. Negative for abdominal distention and constipation.  Endocrine: Negative for polydipsia.  Genitourinary: Negative for dysuria and frequency.  Musculoskeletal: Negative for arthralgias and back pain.  Skin: Negative for rash.  Neurological: Negative for tremors, light-headedness and numbness.  Hematological: Does not bruise/bleed easily.  Psychiatric/Behavioral: Negative for agitation and behavioral problems.    Objective:  BP 133/85   Pulse 92   Temp 98.2 F (36.8 C) (Oral)   Wt 152 lb (68.9 kg)   LMP 03/28/2017   SpO2 99%   BMI 21.81 kg/m   BP/Weight 04/08/2017 04/03/2017 7/61/6073  Systolic BP 710 626  948  Diastolic BP 85 81 86  Wt. (Lbs) 152 145.1 143  BMI 21.81 20.82 20.52      Physical Exam  Constitutional: She is oriented to person, place, and time. She appears well-developed and well-nourished.  Cardiovascular: Normal rate, normal heart sounds and intact distal pulses.   No murmur heard. Pulmonary/Chest: Effort normal and breath sounds normal. She has no wheezes. She has no rales. She exhibits tenderness (Severe tenderness on light palpation of entire right chest wall).  Abdominal: Soft. Bowel sounds are normal. She exhibits no distension and no mass. There is tenderness (severe tenderness worse on palpation of the right lower quadrant and also on range of motion of right lower extremity).  Musculoskeletal: Normal range of motion.  Neurological: She is alert and oriented to person, place, and time.     Assessment & Plan:   1. Pelvic pain Unknown etiology Pain seems to be out of proportion  to pressure applied CT abdomen negative for acute appendicitis currently on gabapentin but apparently this is not helping the numbness; would love to increase dose but she will be seeing neurology next week I will defer increasing dose to them. Pap smear if symptoms persist - meloxicam (MOBIC) 7.5 MG tablet; Take 1 tablet (7.5 mg total) by mouth daily.  Dispense: 30 tablet; Refill: 1 - US Pelvis Complete; Future - US Transvaginal Non-OB; Future  2. Costochondritis Trial of NSAID Chart reveals history of thoracic neuralgia which could also explain symptoms   3. Cough We'll treat for postnasal drip and seasonal allergies Placed on Zyrtec   4. Right axillary mass Repeat diagnostic mammogram and right breast ultrasound is due in 08/2017  Meds ordered this encounter  Medications  . meloxicam (MOBIC) 7.5 MG tablet    Sig: Take 1 tablet (7.5 mg total) by mouth daily.    Dispense:  30 tablet    Refill:  1  . cetirizine (ZYRTEC) 10 MG tablet    Sig: Take 1 tablet (10 mg total) by mouth  daily.    Dispense:  30 tablet    Refill:  1    Follow-up: One month follow-up of pelvic pain   Arnoldo Morale MD

## 2017-04-09 DIAGNOSIS — R2231 Localized swelling, mass and lump, right upper limb: Secondary | ICD-10-CM | POA: Insufficient documentation

## 2017-04-13 ENCOUNTER — Ambulatory Visit: Payer: Self-pay | Attending: Family Medicine

## 2017-04-15 ENCOUNTER — Ambulatory Visit (HOSPITAL_COMMUNITY)
Admission: RE | Admit: 2017-04-15 | Discharge: 2017-04-15 | Disposition: A | Discharge status: Home / Self Care | Payer: Self-pay | Source: Ambulatory Visit | Attending: Family Medicine | Admitting: Family Medicine

## 2017-04-15 ENCOUNTER — Ambulatory Visit (INDEPENDENT_AMBULATORY_CARE_PROVIDER_SITE_OTHER): Payer: Self-pay | Admitting: Nurse Practitioner

## 2017-04-15 ENCOUNTER — Encounter: Payer: Self-pay | Admitting: General Practice

## 2017-04-15 ENCOUNTER — Encounter: Payer: Self-pay | Admitting: Nurse Practitioner

## 2017-04-15 DIAGNOSIS — D259 Leiomyoma of uterus, unspecified: Secondary | ICD-10-CM | POA: Insufficient documentation

## 2017-04-15 DIAGNOSIS — R102 Pelvic and perineal pain: Secondary | ICD-10-CM

## 2017-04-15 DIAGNOSIS — G43009 Migraine without aura, not intractable, without status migrainosus: Secondary | ICD-10-CM

## 2017-04-15 HISTORY — DX: Migraine without aura, not intractable, without status migrainosus: G43.009

## 2017-04-15 MED ORDER — PROPRANOLOL HCL ER 60 MG PO CP24: 60.0000 mg | ORAL_CAPSULE | Freq: Every day | ORAL | 3 refills | Status: DC

## 2017-04-15 NOTE — Progress Notes (Signed)
[facility]  PATIENT: [name] DOB: [birth year]   REASON FOR VISIT: Follow-up for worsening headaches, history of seizure HISTORY FROM: Patient    HISTORY OF PRESENT ILLNESS:UPDATE 05/23/2018CM Ms. [name], 31 year old female returns for follow-up after her last visit February 2016 she has a history of migraine headaches and when last seen was on Lyrica 75 3 times a day. MRI of the brain 01/22/2015 was normal. She never returned  for follow-up. Today complaints was daily headache starts in the back of her head and goes around to the right frontal area she has some blurring of her vision nauseated and vomiting. She is not aware of migraine triggers or foods. She had been on Inderal LA at one time for palpitations and panic attack however she stopped that medication due to lack of follow-up. She returns for reevaluation Patient claims her seizure disorder is managed by her primary care provider and she is on Dilantin  01/03/15 [name] yo patient here for follow up. PMHx depression and anxiety. Within the last week or 2 her lyrica was increased for her extreme chronic pain. From her neck down she has pain. Her legs have been extremely painful and she is falling. Lyrica 75mg  three times a day for the pain. The pain goes into her limbs, she feels like she is having a heart attack it is so bad, she has tingling in the fingers like someone is stabbing her, she has ringing in the ears. Her vision is blurry, she can be driving and things look smaller or bigger or she gets a glare over her eyes. She was told by her surgeon that he spine is perfect. Worsening headaches, vision loss, hearing changes. Headache is severe, is throbbing in her scalp. She thinks she may have MS.    11/22/2014: [name] is a 31 y.o. female PMHx depression and anxiety who is here as a referral from Dr. Feliciana Rossetti for Low back pain. She is a new patient to me but had seen Dr. Janann Colonel in the past.   She is  here to follow up on back pain, worsening. She has pain in the tailbone and in the middle of her back. When it is back it radiates up the back. She has pressure. She can't take deept breaths, it hurts to breath and hard to expand her stomach. She has to get on her knees and try to pop something. Like someone is stabbing her in the back. Feels like the spine is curving. She can't touch her spine. It is daily, she can't sit or stand for long periods of time. Everything makes it worse, walking, standing, sitting. She sleeps with 4 pillows. She has weakness in the legs and arms her legs and feet start to swell and her knees start to give out. She is on Lyrica. She is on naproxen and taking both. The Lyrica used to help with the pain but doesn't so much anymore. She uses a back brace. Radicular symptoms are left > right. 10/10 severe pain.   Appointment 05/10/2014: [name] is a 31 y.o. female here as a referral from Dr. Feliciana Rossetti for seizure evaluation and headache pain.   Seizures started in 2009, initially was having 2-3 times a week. Was evaluated by a neurologist in [place], she is unsure what they found. She reports having had an EEG and imaging done, unsure what the tests showed. Was started on dilantin 300mg  daily, stopped in February due to running out of refills. PCP  gave a refill yesterday and she restarted it today. Notes it has been around 8 months since her last seizure. She does not recall anything during the episode. Based on witness reports they appear to be GTC in nature. She reports when she was a child she had a "5lb tumor on her head", states it went away on its own. No history of renal calculi or glaucoma. Has been diagnosed with PTSD, anxiety, depression.   Notes chronic pressure pain in her head. Described as bilateral, peri-orbital stabbing shooting pain. Having headaches 3 to 4 times a weeks. + Photo and phonophobia, + nausea and some emesis. With headaches gets some blurry visions.  Headaches can last all day to multiple days. Gets some generalized weakness and paresthesias with the headache. Also notes severe back and diffuse MSK pain, chronic and constant.     REVIEW OF SYSTEMS: Full 14 system review of systems performed and notable only for those listed, all others are neg:  Constitutional: Fatigue  Cardiovascular: neg Ear/Nose/Throat: neg  Skin: neg Eyes: Blurred vision Respiratory: neg Gastroitestinal: Nausea and vomiting with headache  Hematology/Lymphatic: neg  Endocrine: neg Musculoskeletal: Joint pain back pain Allergy/Immunology: neg Neurological: Headache Psychiatric: neg Sleep : neg   ALLERGIES: Allergies  Allergen Reactions  . Aspirin Hives    HOME MEDICATIONS: Outpatient Medications Prior to Visit  Medication Sig Dispense Refill  . Alum & Mag Hydroxide-Simeth (GI COCKTAIL) SUSP suspension Take 30 mLs by mouth 2 (two) times daily as needed for indigestion. Shake well.    . cetirizine (ZYRTEC) 10 MG tablet Take 1 tablet (10 mg total) by mouth daily. 30 tablet 1  . docusate sodium (COLACE) 100 MG capsule Take 100 mg by mouth 2 (two) times daily as needed for mild constipation.    . DULoxetine (CYMBALTA) 20 MG capsule Take 1 capsule (20 mg total) by mouth daily. 30 capsule 2  . fluticasone (FLONASE) 50 MCG/ACT nasal spray Place 1 spray into both nostrils daily as needed for allergies or rhinitis.     Marland Kitchen gabapentin (NEURONTIN) 300 MG capsule Take 300 mg by mouth 3 (three) times daily.     . meloxicam (MOBIC) 7.5 MG tablet Take 1 tablet (7.5 mg total) by mouth daily. 30 tablet 1  . metoprolol succinate (TOPROL-XL) 25 MG 24 hr tablet Take 25 mg by mouth daily.    Marland Kitchen omeprazole (PRILOSEC) 40 MG capsule Take 1 capsule (40 mg total) by mouth daily. 30 capsule 3  . ondansetron (ZOFRAN ODT) 4 MG disintegrating tablet Take 1 tablet (4 mg total) by mouth every 8 (eight) hours as needed for nausea or vomiting. 20 tablet 0  . promethazine (PHENERGAN) 25 MG  tablet Take 25 mg by mouth every 6 (six) hours as needed for nausea or vomiting.    . propranolol ER (INDERAL LA) 120 MG 24 hr capsule Take 1 capsule (120 mg total) by mouth daily. 30 capsule 6  . sertraline (ZOLOFT) 25 MG tablet Take 25 mg by mouth daily.    . ondansetron (ZOFRAN) 4 MG tablet Take 1 tablet (4 mg total) by mouth every 8 (eight) hours as needed for nausea or vomiting. (Patient not taking: Reported on 04/15/2017) 10 tablet 0  . polyethylene glycol (MIRALAX / GLYCOLAX) packet Take 17 g by mouth daily as needed for mild constipation.     No facility-administered medications prior to visit.     PAST MEDICAL HISTORY: Past Medical History:  Diagnosis Date  . Arrhythmia Dx 2015  . Back  pain   . Breast mass   . Depression Dx 2000  . Fibromyalgia   . Heart murmur Dx 2015  . 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 Hypertension Dx 56433  . Lumbar radiculopathy   . Panic attack Dx 2006  . PTSD (post-traumatic stress disorder) Dx 2013  . Seizures ([place]) 2001  . Vitamin D deficiency 01/24/2015    PAST SURGICAL HISTORY: Past Surgical History:  Procedure Laterality Date  . ESOPHAGOGASTRODUODENOSCOPY N/A 08/14/2015   Procedure: ESOPHAGOGASTRODUODENOSCOPY (EGD);  Surgeon: Carol Ada, MD;  Location: [facility];  Service: Endoscopy;  Laterality: N/A;    FAMILY HISTORY: Family History  Problem Relation Age of Onset  . Heart attack Mother   . Heart disease Mother   . Sudden death Mother   . Sudden death Sister   . Heart attack Sister   . Sudden death Maternal Grandmother   . Fainting Maternal Grandmother   . Heart attack Maternal Grandmother   . Breast cancer Paternal Grandmother   . Breast cancer Cousin     SOCIAL HISTORY: Social History   Social History  . Marital status: Single    Spouse name: N/A  . Number of children: N/A  . Years of education: N/A   Occupational History  . Not on file.   Social History Main Topics  . Smoking status: Former  Smoker    Quit date: 06/23/2012  . Smokeless tobacco: Never Used  . Alcohol use Yes     Comment: socially  . Drug use: No  . Sexual activity: Yes    Birth control/ protection: None   Other Topics Concern  . Not on file   Social History Narrative   Patient is single, currently raising her nephews   Patient is right handed   Patient's education level is high school graduate   Patient doesn't drink caffeine           PHYSICAL EXAM  Vitals:   04/15/17 0845  BP: 125/80  Pulse: 73  Weight: 144 lb (65.3 kg)  Height: 5\' 10"  (1.778 m)   Body mass index is 20.66 kg/m.  Generalized: Well developed, in no acute distress  Head: normocephalic and atraumatic,. Oropharynx benign  Neck: Supple, no carotid bruits  Cardiac: Regular rate rhythm, no murmur  Musculoskeletal: No deformity   Neurological examination   Mentation: Alert oriented to time, place, history taking. Attention span and concentration appropriate. Recent and remote memory intact.  Follows all commands speech and language fluent.   Cranial nerve II-XII: Fundoscopic exam reveals sharp disc margins.Pupils were equal round reactive to light extraocular movements were full, visual field were full on confrontational test. Facial sensation and strength were normal. hearing was intact to finger rubbing bilaterally. Uvula tongue midline. head turning and shoulder shrug were normal and symmetric.Tongue protrusion into cheek strength was normal. Motor: normal bulk and tone, give way  strength throughout poor effort  Sensory: normal and symmetric to light touch,  Coordination: finger-nose-finger, heel-to-shin bilaterally, no dysmetria Reflexes: Symmetric upper and lower plantar responses were flexor bilaterally. Gait and Station: Rising up from seated position without assistance, normal stance,  moderate stride, good arm swing, smooth turning, able to perform tiptoe, and heel walking without difficulty. Tandem gait is  steady  DIAGNOSTIC DATA (LABS, IMAGING, TESTING) - I reviewed patient records, labs, notes, testing and imaging myself where available.  Lab Results  Component Value Date   WBC 5.0 03/17/2017   HGB 13.0 03/17/2017   HCT 36.0  03/17/2017   MCV 87.8 03/17/2017   PLT 246 03/17/2017      Component Value Date/Time   NA 141 03/17/2017 1334   K 3.5 03/17/2017 1334   CL 107 03/17/2017 1334   CO2 30 03/17/2017 1334   GLUCOSE 84 03/17/2017 1334   BUN 11 03/17/2017 1334   CREATININE 0.71 03/17/2017 1334   CALCIUM 9.0 03/17/2017 1334   PROT 7.1 03/17/2017 1334   ALBUMIN 4.2 03/17/2017 1334   AST 17 03/17/2017 1334   ALT 9 (L) 03/17/2017 1334   ALKPHOS 67 03/17/2017 1334   BILITOT 0.7 03/17/2017 1334   GFRNONAA >60 03/17/2017 1334   GFRAA >60 03/17/2017 1334     Lab Results  Component Value Date   TSH 0.850 11/01/2015      ASSESSMENT AND PLAN  31 y.o. year old female  has a past medical history of Arrhythmia (Dx 2015); Back pain; Breast mass; Depression (Dx 2000); Fibromyalgia; Heart murmur (Dx 2015); ; Hypertension (Dx 20015); Lumbar radiculopathy; Panic attack (Dx 2006); PTSD (post-traumatic stress disorder) (Dx 2013); Seizures ([place]) (2001); and Vitamin D deficiency (01/24/2015). here to follow-up for migraine headaches. MRI of the brain February 2016 was normal   PLAN: Begin Inderal La 60mg  as migraine preventive Given list of migraine triggers and discussed these eliminate one at the time Avoid OTC medications due to rebound Migraine tracker APP to record headaches Follow up in 2 months I spent 25 minutes in total face to face time with the patient more than 50% of which was spent counseling and coordination of care, reviewing test results reviewing medications and discussing and reviewing the diagnosis of migraine and further treatment options. , Rayburn Ma, [facility], APRN  [facility] [address], [place], [place] [address] [phone]

## 2017-04-15 NOTE — Patient Instructions (Signed)
Inderal La 60mg  as migraine preventive Given list of migraine triggers Avoid OTC medications due to rebound Migraine tracker APP to record headaches Follow up in 2 months

## 2017-04-21 NOTE — Progress Notes (Signed)
Personally  participated in, made any corrections needed, and agree with history, physical, neuro exam,assessment and plan as stated above.    Ovetta Bazzano, MD [facility] 

## 2017-05-08 ENCOUNTER — Ambulatory Visit: Payer: Self-pay | Admitting: Family Medicine

## 2017-05-15 ENCOUNTER — Ambulatory Visit: Payer: Self-pay | Attending: Family Medicine | Admitting: Family Medicine

## 2017-05-15 ENCOUNTER — Encounter: Payer: Self-pay | Admitting: Family Medicine

## 2017-05-15 VITALS — BP 110/65 | HR 110 | Temp 98.2°F | Wt 148.8 lb

## 2017-05-15 DIAGNOSIS — R102 Pelvic and perineal pain: Secondary | ICD-10-CM | POA: Insufficient documentation

## 2017-05-15 DIAGNOSIS — R05 Cough: Secondary | ICD-10-CM | POA: Insufficient documentation

## 2017-05-15 DIAGNOSIS — F4310 Post-traumatic stress disorder, unspecified: Secondary | ICD-10-CM | POA: Insufficient documentation

## 2017-05-15 DIAGNOSIS — G8929 Other chronic pain: Secondary | ICD-10-CM | POA: Insufficient documentation

## 2017-05-15 DIAGNOSIS — I10 Essential (primary) hypertension: Secondary | ICD-10-CM | POA: Insufficient documentation

## 2017-05-15 DIAGNOSIS — E559 Vitamin D deficiency, unspecified: Secondary | ICD-10-CM | POA: Insufficient documentation

## 2017-05-15 DIAGNOSIS — F331 Major depressive disorder, recurrent, moderate: Secondary | ICD-10-CM | POA: Insufficient documentation

## 2017-05-15 DIAGNOSIS — M797 Fibromyalgia: Secondary | ICD-10-CM | POA: Insufficient documentation

## 2017-05-15 DIAGNOSIS — R059 Cough, unspecified: Secondary | ICD-10-CM

## 2017-05-15 DIAGNOSIS — D259 Leiomyoma of uterus, unspecified: Secondary | ICD-10-CM | POA: Insufficient documentation

## 2017-05-15 DIAGNOSIS — R5382 Chronic fatigue, unspecified: Secondary | ICD-10-CM | POA: Insufficient documentation
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
## 2017-06-16 ENCOUNTER — Ambulatory Visit: Payer: Self-pay | Admitting: Nurse Practitioner

## 2017-06-16 NOTE — Progress Notes (Deleted)
[facility]  PATIENT: [name] DOB: 11-Feb-1986   REASON FOR VISIT: Follow-up for worsening headaches, history of seizure HISTORY FROM: Patient    HISTORY OF PRESENT ILLNESS:UPDATE 05/23/2018CM [name], 31 year old female returns for follow-up after her last visit February 2016 she has a history of migraine headaches and when last seen was on Lyrica 75 3 times a day. MRI of the brain 01/22/2015 was normal. She never returned  for follow-up. Today complaints was daily headache starts in the back of her head and goes around to the right frontal area she has some blurring of her vision nauseated and vomiting. She is not aware of migraine triggers or foods. She had been on Inderal LA at one time for palpitations and panic attack however she stopped that medication due to lack of follow-up. She returns for reevaluation Patient claims her seizure disorder is managed by her primary care provider and she is on Dilantin  01/03/15 [name] yo patient here for follow up. PMHx depression and anxiety. Within the last week or 2 her lyrica was increased for her extreme chronic pain. From her neck down she has pain. Her legs have been extremely painful and she is falling. Lyrica 75mg  three times a day for the pain. The pain goes into her limbs, she feels like she is having a heart attack it is so bad, she has tingling in the fingers like someone is stabbing her, she has ringing in the ears. Her vision is blurry, she can be driving and things look smaller or bigger or she gets a glare over her eyes. She was told by her surgeon that he spine is perfect. Worsening headaches, vision loss, hearing changes. Headache is severe, is throbbing in her scalp. She thinks she may have MS.    11/22/2014: [name] is a 31 y.o. female PMHx depression and anxiety who is here as a referral from Dr. Feliciana Rossetti for Low back pain. She is a new patient to me but had seen Dr. Janann Colonel in the past.   She is  here to follow up on back pain, worsening. She has pain in the tailbone and in the middle of her back. When it is back it radiates up the back. She has pressure. She can't take deept breaths, it hurts to breath and hard to expand her stomach. She has to get on her knees and try to pop something. Like someone is stabbing her in the back. Feels like the spine is curving. She can't touch her spine. It is daily, she can't sit or stand for long periods of time. Everything makes it worse, walking, standing, sitting. She sleeps with 4 pillows. She has weakness in the legs and arms her legs and feet start to swell and her knees start to give out. She is on Lyrica. She is on naproxen and taking both. The Lyrica used to help with the pain but doesn't so much anymore. She uses a back brace. Radicular symptoms are left > right. 10/10 severe pain.   Appointment 05/10/2014: [name] is a 31 y.o. female here as a referral from Dr. Feliciana Rossetti for seizure evaluation and headache pain.   Seizures started in 2009, initially was having 2-3 times a week. Was evaluated by a neurologist in [name], she is unsure what they found. She reports having had an EEG and imaging done, unsure what the tests showed. Was started on dilantin 300mg  daily, stopped in February due to running out of refills. PCP  gave a refill yesterday and she restarted it today. Notes it has been around 8 months since her last seizure. She does not recall anything during the episode. Based on witness reports they appear to be GTC in nature. She reports when she was a child she had a "5lb tumor on her head", states it went away on its own. No history of renal calculi or glaucoma. Has been diagnosed with PTSD, anxiety, depression.   Notes chronic pressure pain in her head. Described as bilateral, peri-orbital stabbing shooting pain. Having headaches 3 to 4 times a weeks. + Photo and phonophobia, + nausea and some emesis. With headaches gets some blurry visions.  Headaches can last all day to multiple days. Gets some generalized weakness and paresthesias with the headache. Also notes severe back and diffuse MSK pain, chronic and constant.     REVIEW OF SYSTEMS: Full 14 system review of systems performed and notable only for those listed, all others are neg:  Constitutional: Fatigue  Cardiovascular: neg Ear/Nose/Throat: neg  Skin: neg Eyes: Blurred vision Respiratory: neg Gastroitestinal: Nausea and vomiting with headache  Hematology/Lymphatic: neg  Endocrine: neg Musculoskeletal: Joint pain back pain Allergy/Immunology: neg Neurological: Headache Psychiatric: neg Sleep : neg   ALLERGIES: Allergies  Allergen Reactions  . Aspirin Hives    HOME MEDICATIONS: Outpatient Medications Prior to Visit  Medication Sig Dispense Refill  . Alum & Mag Hydroxide-Simeth (GI COCKTAIL) SUSP suspension Take 30 mLs by mouth 2 (two) times daily as needed for indigestion. Shake well.    . cetirizine (ZYRTEC) 10 MG tablet Take 1 tablet (10 mg total) by mouth daily. (Patient not taking: Reported on 05/15/2017) 30 tablet 1  . docusate sodium (COLACE) 100 MG capsule Take 100 mg by mouth 2 (two) times daily as needed for mild constipation.    . DULoxetine (CYMBALTA) 20 MG capsule Take 1 capsule (20 mg total) by mouth daily. (Patient not taking: Reported on 05/15/2017) 30 capsule 2  . fluticasone (FLONASE) 50 MCG/ACT nasal spray Place 1 spray into both nostrils daily as needed for allergies or rhinitis.     Marland Kitchen gabapentin (NEURONTIN) 300 MG capsule Take 300 mg by mouth 3 (three) times daily.     . meloxicam (MOBIC) 7.5 MG tablet Take 1 tablet (7.5 mg total) by mouth daily. (Patient not taking: Reported on 05/15/2017) 30 tablet 1  . metoprolol succinate (TOPROL-XL) 25 MG 24 hr tablet Take 25 mg by mouth daily.    Marland Kitchen omeprazole (PRILOSEC) 40 MG capsule Take 1 capsule (40 mg total) by mouth daily. (Patient not taking: Reported on 05/15/2017) 30 capsule 3  . ondansetron  (ZOFRAN ODT) 4 MG disintegrating tablet Take 1 tablet (4 mg total) by mouth every 8 (eight) hours as needed for nausea or vomiting. (Patient not taking: Reported on 05/15/2017) 20 tablet 0  . phenytoin (DILANTIN) 300 MG ER capsule Take 300 mg by mouth 3 (three) times daily.    . promethazine (PHENERGAN) 25 MG tablet Take 25 mg by mouth every 6 (six) hours as needed for nausea or vomiting.    . propranolol ER (INDERAL LA) 60 MG 24 hr capsule Take 1 capsule (60 mg total) by mouth daily. (Patient not taking: Reported on 05/15/2017) 30 capsule 3  . sertraline (ZOLOFT) 25 MG tablet Take 25 mg by mouth daily.     No facility-administered medications prior to visit.     PAST MEDICAL HISTORY: Past Medical History:  Diagnosis Date  . Arrhythmia Dx 2015  . Back  pain   . Breast mass   . Depression Dx 2000  . Fibromyalgia   . Heart murmur Dx 2015  . 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 [place] Dx [address]  . Lumbar radiculopathy   . Panic attack Dx 2006  . PTSD (post-traumatic stress disorder) Dx 2013  . Seizures ([place]) 2001  . Vitamin D deficiency 01/24/2015    PAST SURGICAL HISTORY: Past Surgical History:  Procedure Laterality Date  . ESOPHAGOGASTRODUODENOSCOPY N/A 08/14/2015   Procedure: ESOPHAGOGASTRODUODENOSCOPY (EGD);  Surgeon: Carol Ada, MD;  Location: [facility];  Service: Endoscopy;  Laterality: N/A;    FAMILY HISTORY: Family History  Problem Relation Age of Onset  . Heart attack Mother   . Heart disease Mother   . Sudden death Mother   . Sudden death Sister   . Heart attack Sister   . Sudden death Maternal Grandmother   . Fainting Maternal Grandmother   . Heart attack Maternal Grandmother   . Breast cancer Paternal Grandmother   . Breast cancer Cousin     SOCIAL HISTORY: Social History   Social History  . Marital status: Single    Spouse name: N/A  . Number of children: N/A  . Years of education: N/A   Occupational History  . Not on file.    Social History Main Topics  . Smoking status: Former Smoker    Quit date: 06/23/2012  . Smokeless tobacco: Never Used  . Alcohol use Yes     Comment: socially  . Drug use: No  . Sexual activity: Yes    Birth control/ protection: None   Other Topics Concern  . Not on file   Social History Narrative   Patient is single, currently raising her nephews   Patient is right handed   Patient's education level is high school graduate   Patient doesn't drink caffeine           PHYSICAL EXAM  There were no vitals filed for this visit. There is no height or weight on file to calculate BMI.  Generalized: Well developed, in no acute distress  Head: normocephalic and atraumatic,. Oropharynx benign  Neck: Supple, no carotid bruits  Cardiac: Regular rate rhythm, no murmur  Musculoskeletal: No deformity   Neurological examination   Mentation: Alert oriented to time, place, history taking. Attention span and concentration appropriate. Recent and remote memory intact.  Follows all commands speech and language fluent.   Cranial nerve II-XII: Fundoscopic exam reveals sharp disc margins.Pupils were equal round reactive to light extraocular movements were full, visual field were full on confrontational test. Facial sensation and strength were normal. hearing was intact to finger rubbing bilaterally. Uvula tongue midline. head turning and shoulder shrug were normal and symmetric.Tongue protrusion into cheek strength was normal. Motor: normal bulk and tone, give way  strength throughout poor effort  Sensory: normal and symmetric to light touch,  Coordination: finger-nose-finger, heel-to-shin bilaterally, no dysmetria Reflexes: Symmetric upper and lower plantar responses were flexor bilaterally. Gait and Station: Rising up from seated position without assistance, normal stance,  moderate stride, good arm swing, smooth turning, able to perform tiptoe, and heel walking without difficulty. Tandem gait  is steady  DIAGNOSTIC DATA (LABS, IMAGING, TESTING) - I reviewed patient records, labs, notes, testing and imaging myself where available.  Lab Results  Component Value Date   WBC 5.0 03/17/2017   HGB 13.0 03/17/2017   HCT 36.0 03/17/2017   MCV 87.8 03/17/2017   PLT 246 03/17/2017  Component Value Date/Time   NA 141 03/17/2017 1334   K 3.5 03/17/2017 1334   CL 107 03/17/2017 1334   CO2 30 03/17/2017 1334   GLUCOSE 84 03/17/2017 1334   BUN 11 03/17/2017 1334   CREATININE 0.71 03/17/2017 1334   CALCIUM 9.0 03/17/2017 1334   PROT 7.1 03/17/2017 1334   ALBUMIN 4.2 03/17/2017 1334   AST 17 03/17/2017 1334   ALT 9 (L) 03/17/2017 1334   ALKPHOS 67 03/17/2017 1334   BILITOT 0.7 03/17/2017 1334   GFRNONAA >60 03/17/2017 1334   GFRAA >60 03/17/2017 1334     Lab Results  Component Value Date   TSH 0.850 11/01/2015      ASSESSMENT AND PLAN  31 y.o. year old female  has a past medical history of Arrhythmia (Dx 2015); Back pain; Breast mass; Depression (Dx 2000); Fibromyalgia; Heart murmur (Dx 2015); ; Hypertension (Dx 20015); Lumbar radiculopathy; Panic attack (Dx 2006); PTSD (post-traumatic stress disorder) (Dx 2013); Seizures ([place]) (2001); and Vitamin D deficiency (01/24/2015). here to follow-up for migraine headaches. MRI of the brain February 2016 was normal   PLAN: Begin Inderal La 60mg  as migraine preventive Given list of migraine triggers and discussed these eliminate one at the time Avoid OTC medications due to rebound Migraine tracker APP to record headaches Follow up in 2 months I spent 25 minutes in total face to face time with the patient more than 50% of which was spent counseling and coordination of care, reviewing test results reviewing medications and discussing and reviewing the diagnosis of migraine and further treatment options. , Rayburn Ma, [facility], APRN  [facility] [address], [place], [place] [address] [phone]

## 2017-06-17 ENCOUNTER — Ambulatory Visit (INDEPENDENT_AMBULATORY_CARE_PROVIDER_SITE_OTHER): Payer: Self-pay | Admitting: Nurse Practitioner

## 2017-06-17 ENCOUNTER — Encounter: Payer: Self-pay | Admitting: Nurse Practitioner

## 2017-06-17 VITALS — BP 112/77 | HR 80 | Wt 147.8 lb

## 2017-06-17 DIAGNOSIS — G43009 Migraine without aura, not intractable, without status migrainosus: Secondary | ICD-10-CM

## 2017-06-17 MED ORDER — PROPRANOLOL HCL ER 60 MG PO CP24: 60.0000 mg | ORAL_CAPSULE | Freq: Every day | ORAL | 6 refills | Status: DC

## 2017-06-17 NOTE — Patient Instructions (Signed)
Continue  Inderal La 60mg  as migraine preventive Continue to eliminate migraine triggers  Avoid OTC medications due to rebound Migraine tracker APP to record headaches Follow up in 4 months with Dr. Jaynee Eagles Your leg and back pain is managed by Dr. Tonita Cong ortho

## 2017-06-17 NOTE — Progress Notes (Signed)
[facility]  PATIENT: [name] DOB: Dec 19, 1985   REASON FOR VISIT: Follow-up for worsening headaches, history of seizure HISTORY FROM: Patient    HISTORY OF PRESENT ILLNESS:UPDATE 07/25/2018CM [name], 31 year old female returns for follow-up with history of migraine headaches. She was last seen in May 2018 and prior to that February 2016 by Dr. Jaynee Eagles. When last seen her propanolol was started 60 mg daily. In addition migraine triggers reviewed on return visit today she has cut out a lot of foods. She has also stopped over-the-counter preparations. She did not keep a record of her migraines is asked that she reports that they are better. She also complains with some right leg stiffness and pain in the hip area. She was advised to either follow up with orthopedic she is seen in the past or her primary care. She returns for reevaluation  UPDATE 05/23/2018CM [name], 31 year old female returns for follow-up after her last visit February 2016 she has a history of migraine headaches and when last seen was on Lyrica 75 3 times a day. MRI of the brain 01/22/2015 was normal. She never returned  for follow-up. Today complaints was daily headache starts in the back of her head and goes around to the right frontal area she has some blurring of her vision nauseated and vomiting. She is not aware of migraine triggers or foods. She had been on Inderal LA at one time for palpitations and panic attack however she stopped that medication due to lack of follow-up. She returns for reevaluation Patient claims her seizure disorder is managed by her primary care provider and she is on Dilantin  01/03/15 [name] yo patient here for follow up. PMHx depression and anxiety. Within the last week or 2 her lyrica was increased for her extreme chronic pain. From her neck down she has pain. Her legs have been extremely painful and she is falling. Lyrica 75mg  three times a day for the pain. The pain  goes into her limbs, she feels like she is having a heart attack it is so bad, she has tingling in the fingers like someone is stabbing her, she has ringing in the ears. Her vision is blurry, she can be driving and things look smaller or bigger or she gets a glare over her eyes. She was told by her surgeon that he spine is perfect. Worsening headaches, vision loss, hearing changes. Headache is severe, is throbbing in her scalp. She thinks she may have MS.    11/22/2014: [name] is a 31 y.o. female PMHx depression and anxiety who is here as a referral from Dr. Feliciana Rossetti for Low back pain. She is a new patient to me but had seen Dr. Janann Colonel in the past.   She is here to follow up on back pain, worsening. She has pain in the tailbone and in the middle of her back. When it is back it radiates up the back. She has pressure. She can't take deept breaths, it hurts to breath and hard to expand her stomach. She has to get on her knees and try to pop something. Like someone is stabbing her in the back. Feels like the spine is curving. She can't touch her spine. It is daily, she can't sit or stand for long periods of time. Everything makes it worse, walking, standing, sitting. She sleeps with 4 pillows. She has weakness in the legs and arms her legs and feet start to swell and her knees start to give out.  She is on Lyrica. She is on naproxen and taking both. The Lyrica used to help with the pain but doesn't so much anymore. She uses a back brace. Radicular symptoms are left > right. 10/10 severe pain.   Appointment 05/10/2014: [name] is a 31 y.o. female here as a referral from Dr. Feliciana Rossetti for seizure evaluation and headache pain.   Seizures started in 2009, initially was having 2-3 times a week. Was evaluated by a neurologist in [place], she is unsure what they found. She reports having had an EEG and imaging done, unsure what the tests showed. Was started on dilantin 300mg  daily, stopped in February  due to running out of refills. PCP gave a refill yesterday and she restarted it today. Notes it has been around 8 months since her last seizure. She does not recall anything during the episode. Based on witness reports they appear to be GTC in nature. She reports when she was a child she had a "5lb tumor on her head", states it went away on its own. No history of renal calculi or glaucoma. Has been diagnosed with PTSD, anxiety, depression.   Notes chronic pressure pain in her head. Described as bilateral, peri-orbital stabbing shooting pain. Having headaches 3 to 4 times a weeks. + Photo and phonophobia, + nausea and some emesis. With headaches gets some blurry visions. Headaches can last all day to multiple days. Gets some generalized weakness and paresthesias with the headache. Also notes severe back and diffuse MSK pain, chronic and constant.     REVIEW OF SYSTEMS: Full 14 system review of systems performed and notable only for those listed, all others are neg:  Constitutional: Fatigue  Cardiovascular: neg Ear/Nose/Throat: neg  Skin: neg Eyes: Blurred vision, light sensitivity Respiratory: neg Gastroitestinal: Nausea and vomiting with headache  Hematology/Lymphatic: Easy bruising  Endocrine: neg Musculoskeletal: Joint pain back pain Allergy/Immunology: neg Neurological: Headache Psychiatric: neg Sleep : Insomnia   ALLERGIES: Allergies  Allergen Reactions  . Aspirin Hives    HOME MEDICATIONS: Outpatient Medications Prior to Visit  Medication Sig Dispense Refill  . Alum & Mag Hydroxide-Simeth (GI COCKTAIL) SUSP suspension Take 30 mLs by mouth 2 (two) times daily as needed for indigestion. Shake well.    . cetirizine (ZYRTEC) 10 MG tablet Take 1 tablet (10 mg total) by mouth daily. 30 tablet 1  . docusate sodium (COLACE) 100 MG capsule Take 100 mg by mouth 2 (two) times daily as needed for mild constipation.    . fluticasone (FLONASE) 50 MCG/ACT nasal spray Place 1 spray into  both nostrils daily as needed for allergies or rhinitis.     Marland Kitchen gabapentin (NEURONTIN) 300 MG capsule Take 300 mg by mouth 3 (three) times daily.     . meloxicam (MOBIC) 7.5 MG tablet Take 1 tablet (7.5 mg total) by mouth daily. 30 tablet 1  . metoprolol succinate (TOPROL-XL) 25 MG 24 hr tablet Take 25 mg by mouth daily.    Marland Kitchen omeprazole (PRILOSEC) 40 MG capsule Take 1 capsule (40 mg total) by mouth daily. 30 capsule 3  . ondansetron (ZOFRAN ODT) 4 MG disintegrating tablet Take 1 tablet (4 mg total) by mouth every 8 (eight) hours as needed for nausea or vomiting. 20 tablet 0  . phenytoin (DILANTIN) 300 MG ER capsule Take 300 mg by mouth 3 (three) times daily.    . promethazine (PHENERGAN) 25 MG tablet Take 25 mg by mouth every 6 (six) hours as needed for nausea or vomiting.    Marland Kitchen  propranolol ER (INDERAL LA) 60 MG 24 hr capsule Take 1 capsule (60 mg total) by mouth daily. 30 capsule 3  . sertraline (ZOLOFT) 25 MG tablet Take 25 mg by mouth daily.    . DULoxetine (CYMBALTA) 20 MG capsule Take 1 capsule (20 mg total) by mouth daily. (Patient not taking: Reported on 06/17/2017) 30 capsule 2   No facility-administered medications prior to visit.     PAST MEDICAL HISTORY: Past Medical History:  Diagnosis Date  . Arrhythmia Dx 2015  . Back pain   . Breast mass   . Depression Dx 2000  . Fibromyalgia   . Headache    migraines  . Heart murmur Dx 2015  . 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 [place] Dx [address]  . Lumbar radiculopathy   . Panic attack Dx 2006  . PTSD (post-traumatic stress disorder) Dx 2013  . Seizures ([place]) 2001  . Vitamin D deficiency 01/24/2015    PAST SURGICAL HISTORY: Past Surgical History:  Procedure Laterality Date  . ESOPHAGOGASTRODUODENOSCOPY N/A 08/14/2015   Procedure: ESOPHAGOGASTRODUODENOSCOPY (EGD);  Surgeon: Carol Ada, MD;  Location: [facility];  Service: Endoscopy;  Laterality: N/A;    FAMILY HISTORY: Family History  Problem Relation Age  of Onset  . Heart attack Mother   . Heart disease Mother   . Sudden death Mother   . Sudden death Sister   . Heart attack Sister   . Sudden death Maternal Grandmother   . Fainting Maternal Grandmother   . Heart attack Maternal Grandmother   . Breast cancer Paternal Grandmother   . Breast cancer Cousin     SOCIAL HISTORY: Social History   Social History  . Marital status: Single    Spouse name: N/A  . Number of children: N/A  . Years of education: 51   Occupational History  . Not on file.   Social History Main Topics  . Smoking status: Former Smoker    Quit date: 06/23/2012  . Smokeless tobacco: Never Used  . Alcohol use Yes     Comment: socially  . Drug use: No  . Sexual activity: Yes    Birth control/ protection: None   Other Topics Concern  . Not on file   Social History Narrative   Patient is single, currently raising her nephews   Patient is right handed   Patient's education level is high school graduate   Patient doesn't drink caffeine           PHYSICAL EXAM  Vitals:   06/17/17 1010  BP: 112/77  Pulse: 80  Weight: 147 lb 12.8 oz (67 kg)   Body mass index is 21.21 kg/m.  Generalized: Well developed, in no acute distress  Head: normocephalic and atraumatic,. Oropharynx benign  Neck: Supple, no carotid bruits  Cardiac: Regular rate rhythm, no murmur  Musculoskeletal: No deformity   Neurological examination   Mentation: Alert oriented to time, place, history taking. Attention span and concentration appropriate. Recent and remote memory intact.  Follows all commands speech and language fluent.   Cranial nerve II-XII: .Pupils were equal round reactive to light extraocular movements were full, visual field were full on confrontational test. Facial sensation and strength were normal. hearing was intact to finger rubbing bilaterally. Uvula tongue midline. head turning and shoulder shrug were normal and symmetric.Tongue protrusion into cheek strength  was normal. Motor: normal bulk and tone, give way  strength throughout with poor effort  Sensory: normal and symmetric to light touch, in  the upper and lower extremities  Coordination: finger-nose-finger, heel-to-shin bilaterally, no dysmetria Reflexes: Symmetric upper and lower plantar responses were flexor bilaterally. Gait and Station: Rising up from seated position without assistance, normal stance,  moderate stride, good arm swing, smooth turning, able to perform tiptoe, and heel walking without difficulty. Tandem gait is unsteady  DIAGNOSTIC DATA (LABS, IMAGING, TESTING) - I reviewed patient records, labs, notes, testing and imaging myself where available.  Lab Results  Component Value Date   WBC 5.0 03/17/2017   HGB 13.0 03/17/2017   HCT 36.0 03/17/2017   MCV 87.8 03/17/2017   PLT 246 03/17/2017      Component Value Date/Time   NA 141 03/17/2017 1334   K 3.5 03/17/2017 1334   CL 107 03/17/2017 1334   CO2 30 03/17/2017 1334   GLUCOSE 84 03/17/2017 1334   BUN 11 03/17/2017 1334   CREATININE 0.71 03/17/2017 1334   CALCIUM 9.0 03/17/2017 1334   PROT 7.1 03/17/2017 1334   ALBUMIN 4.2 03/17/2017 1334   AST 17 03/17/2017 1334   ALT 9 (L) 03/17/2017 1334   ALKPHOS 67 03/17/2017 1334   BILITOT 0.7 03/17/2017 1334   GFRNONAA >60 03/17/2017 1334   GFRAA >60 03/17/2017 1334     Lab Results  Component Value Date   TSH 0.850 11/01/2015      ASSESSMENT AND PLAN  31 y.o. year old female  has a past medical history of Arrhythmia (Dx 2015); Back pain; Breast mass; Depression (Dx 2000); Fibromyalgia; Heart murmur (Dx 2015); ; Hypertension (Dx 20015); Lumbar radiculopathy; Panic attack (Dx 2006); PTSD (post-traumatic stress disorder) (Dx 2013); Seizures ([place]) (2001); and Vitamin D deficiency (01/24/2015). here to follow-up for migraine headaches. MRI of the brain February 2016 was normal.   PLAN: Continue  Inderal La 60mg  as migraine preventive will refill Continue to eliminate  migraine triggers  Avoid OTC medications due to rebound Migraine tracker APP to record headaches Follow up in 4 months with Dr. Jaynee Eagles Your leg and back pain is managed by Dr. Tonita Cong ortho in 2015 and more recently by PCP I spent 25 minutes in total face to face time with the patient more than 50% of which was spent counseling and coordination of care, reviewing test results reviewing medications and discussing and reviewing the diagnosis of migraine and further treatment options. , Rayburn Ma, [facility], APRN  [facility] [address] [place], [place] [address] [phone]

## 2017-07-23 ENCOUNTER — Other Ambulatory Visit: Payer: Self-pay | Admitting: Obstetrics and Gynecology

## 2017-07-23 DIAGNOSIS — N63 Unspecified lump in unspecified breast: Secondary | ICD-10-CM

## 2017-08-01 NOTE — Progress Notes (Signed)
Personally  participated in, made any corrections needed, and agree with history, physical, neuro exam,assessment and plan as stated.     Antonia Ahern, MD [facility]     

## 2017-08-17 ENCOUNTER — Ambulatory Visit
Admission: RE | Admit: 2017-08-17 | Discharge: 2017-08-17 | Disposition: A | Discharge status: Home / Self Care | Payer: No Typology Code available for payment source | Source: Ambulatory Visit | Attending: Obstetrics and Gynecology | Admitting: Obstetrics and Gynecology

## 2017-08-17 ENCOUNTER — Other Ambulatory Visit: Payer: Self-pay | Admitting: Obstetrics and Gynecology

## 2017-08-17 DIAGNOSIS — N63 Unspecified lump in unspecified breast: Secondary | ICD-10-CM

## 2017-08-17 DIAGNOSIS — N6310 Unspecified lump in the right breast, unspecified quadrant: Secondary | ICD-10-CM

## 2017-08-18 ENCOUNTER — Telehealth: Payer: Self-pay | Admitting: Family Medicine

## 2017-08-18 NOTE — Telephone Encounter (Signed)
Pt. Called requesting to be referred out to the Rheumatology b/c she states that she hurt her spine. Please f/u with pt.

## 2017-08-19 ENCOUNTER — Ambulatory Visit: Payer: Self-pay | Attending: Family Medicine | Admitting: Physician Assistant

## 2017-08-19 ENCOUNTER — Encounter: Payer: Self-pay | Admitting: Physician Assistant

## 2017-08-19 VITALS — BP 118/83 | HR 96 | Temp 98.2°F | Wt 154.4 lb

## 2017-08-19 DIAGNOSIS — I10 Essential (primary) hypertension: Secondary | ICD-10-CM | POA: Insufficient documentation

## 2017-08-19 DIAGNOSIS — Z79899 Other long term (current) drug therapy: Secondary | ICD-10-CM | POA: Insufficient documentation

## 2017-08-19 DIAGNOSIS — M5416 Radiculopathy, lumbar region: Secondary | ICD-10-CM | POA: Insufficient documentation

## 2017-08-19 DIAGNOSIS — M797 Fibromyalgia: Secondary | ICD-10-CM | POA: Insufficient documentation

## 2017-08-19 DIAGNOSIS — E559 Vitamin D deficiency, unspecified: Secondary | ICD-10-CM | POA: Insufficient documentation

## 2017-08-19 DIAGNOSIS — R52 Pain, unspecified: Secondary | ICD-10-CM

## 2017-08-19 DIAGNOSIS — F4310 Post-traumatic stress disorder, unspecified: Secondary | ICD-10-CM | POA: Insufficient documentation

## 2017-08-19 DIAGNOSIS — Z886 Allergy status to analgesic agent status: Secondary | ICD-10-CM | POA: Insufficient documentation

## 2017-08-19 DIAGNOSIS — F410 Panic disorder [episodic paroxysmal anxiety] without agoraphobia: Secondary | ICD-10-CM | POA: Insufficient documentation

## 2017-08-19 DIAGNOSIS — F329 Major depressive disorder, single episode, unspecified: Secondary | ICD-10-CM | POA: Insufficient documentation

## 2017-08-19 DIAGNOSIS — M62838 Other muscle spasm: Secondary | ICD-10-CM | POA: Insufficient documentation

## 2017-08-19 DIAGNOSIS — M542 Cervicalgia: Secondary | ICD-10-CM | POA: Insufficient documentation

## 2017-08-19 MED ORDER — KETOROLAC TROMETHAMINE 60 MG/2ML IM SOLN
60.0000 mg | Freq: Once | INTRAMUSCULAR | Status: AC
  Administered 2017-08-19: 60 mg via INTRAMUSCULAR

## 2017-08-19 MED ORDER — NAPROXEN 500 MG PO TABS: 500.0000 mg | ORAL_TABLET | Freq: Two times a day (BID) | ORAL | 0 refills | Status: DC

## 2017-08-19 MED ORDER — METHOCARBAMOL 500 MG PO TABS: 500.0000 mg | ORAL_TABLET | Freq: Three times a day (TID) | ORAL | 0 refills | Status: DC

## 2017-08-19 MED FILL — NAPROXEN 500 MG TABLET: 500 | 30 days supply | Qty: 60 | Fill #0

## 2017-08-19 MED FILL — METHOCARBAMOL 500 MG TABLET: 500 | 20 days supply | Qty: 60 | Fill #0

## 2017-08-19 NOTE — Patient Instructions (Signed)
Acute Torticollis, Adult Torticollis is a condition in which the muscles of the neck tighten (contract) abnormally, causing the neck to twist and the head to move into an unnatural position. Torticollis that develops suddenly is called acute torticollis. People with acute torticollis may have trouble turning their head. The condition can be painful and may range from mild to severe. What are the causes? This condition may be caused by:  Sleeping in an awkward position (common).  Extending or twisting the neck muscles beyond their normal position.  An injury to the neck muscles.  An infection.  A tumor.  Certain medicines.  Long-lasting spasms of the neck muscles.  In some cases, the cause may not be known. What increases the risk? You are more likely to develop this condition if:  You have a condition associated with loose ligaments, such as Down syndrome.  You have a brain condition that affects vision, such as strabismus.  What are the signs or symptoms? The main symptom of this condition is tilting of the head to one side. Other symptoms include:  Pain in the neck.  Trouble turning the head from side to side or up and down.  How is this diagnosed? This condition may be diagnosed based on:  A physical exam.  Your medical history.  Imaging tests, such as: ? An X-ray. ? An ultrasound. ? A CT scan. ? An MRI.  How is this treated? Treatment for this condition depends on what is causing the condition. Mild cases may go away without treatment. Treatment for more serious cases may include:  Medicines or shots to relax the muscles.  Other medicines, such as antibiotics to treat the underlying cause.  Wearing a soft neck collar.  Physical therapy and stretching to improve neck strength and flexibility.  Neck massage.  In severe cases, surgery may be needed to repair dislocated or broken bones or to treat nerves in the neck. Follow these instructions at  home:  Take over-the-counter and prescription medicines only as told by your health care provider.  Do stretching exercises and massage your neck as told by your health care provider.  If directed, apply heat to the affected area as often as told by your health care provider. Use the heat source that your health care provider recommends, such as a moist heat pack or a heating pad. ? Place a towel between your skin and the heat source. ? Leave the heat on for 20-30 minutes. ? Remove the heat if your skin turns bright red. This is especially important if you are unable to feel pain, heat, or cold. You may have a greater risk of getting burned.  If you wake up with torticollis after sleeping, check your bed or sleeping area. Look for lumpy pillows or unusual objects. Make sure your bed and sleeping area are comfortable.  Keep all follow-up visits as told by your health care provider. This is important. Contact a health care provider if:  You have a fever.  Your symptoms do not improve or they get worse. Get help right away if:  You have trouble breathing.  You develop noisy breathing (stridor).  You start to drool.  You have trouble swallowing or pain when swallowing.  You develop numbness or weakness in your hands or feet.  You have changes in your speech, understanding, or vision.  You are in severe pain.  You cannot move your head or neck. Summary  Torticollis is a condition in which the muscles of the neck   tighten (contract) abnormally, causing the neck to twist and the head to move into an unnatural position. Torticollis that develops suddenly is called acute torticollis.  Treatment for this condition depends on what is causing the condition. Mild cases may go away without treatment.  Do stretching exercises and massage your neck as told by your health care provider. You may also be instructed to apply heat to the area.  Contact your health care provider if your symptoms  do not improve or they get worse. This information is not intended to replace advice given to you by your health care provider. Make sure you discuss any questions you have with your health care provider. Document Released: 11/07/2000 Document Revised: 01/08/2017 Document Reviewed: 01/08/2017 Elsevier Interactive Patient Education  2018 Elsevier Inc.  

## 2017-08-19 NOTE — Telephone Encounter (Signed)
Pt was seen by Freeman Caldron on 08/19/17

## 2017-08-19 NOTE — Progress Notes (Signed)
-------------------------------------------------------------------------------------------------------------------------------------------------------------Patient ID: [name], female   DOB: 19-Jan-1986, 31 y.o.   MRN: [id number]   [name], is a 31 y.o. female  EGB:[id number]  [id number]:[id number]  DOB - [birth year]  Subjective:  Chief Complaint and HPI: [name] is a 31 y.o. female here today for bad neck pain and inability to move her neck.  She c/o "overall spine pain" for a while now.  She has been seeing a Restaurant manager, fast food and has seen other MDs.  She has xrays from the chiropractor she has asked me to review.  Pain worsened acutely in the night on Monday.  There were no precipitating or exacerbating factors. NKI.  She has been unable to turn or bend her head/neck area.  No numbness or weakness.  Associate symptom of Headache with flashing lights which is not unusual for her.  SHe has not taken any OTC.  She is requesting for me to refer her to a rheumatologist.  She denies f/c.  No dysphagia, ST or s/sx URI.  No SOB.  No N/V/D.  A friend is here with her.   Says she tolerates NSAIDs w/o any problems.  She has tried some tiger balm patches with minimal relief.     ROS:   Constitutional:  No f/c, No night sweats, No unexplained weight loss. EENT:  No vision changes except flashing lights as noted above, No blurry vision, No hearing changes. No mouth, throat, or ear problems.  Respiratory: No cough, No SOB Cardiac: No CP, no palpitations GI:  No abd pain, No N/V/D. GU: No Urinary s/sx Musculoskeletal: +neck and spine pain Neuro: No headache, no dizziness, no motor weakness.  Skin: No rash Endocrine:  No polydipsia. No polyuria.  Psych: Denies SI/HI  No problems updated.  ALLERGIES: Allergies  Allergen Reactions  . Aspirin Hives    PAST MEDICAL HISTORY: Past Medical History:  Diagnosis Date  . Arrhythmia Dx 2015  . Back pain   . Breast mass   . Depression Dx 2000  .  Fibromyalgia   . Headache    migraines  . Heart murmur Dx 2015  . 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 Hypertension Dx 62703  . Lumbar radiculopathy   . Panic attack Dx 2006  . PTSD (post-traumatic stress disorder) Dx 2013  . Seizures ([place]) 2001  . Vitamin D deficiency 01/24/2015    MEDICATIONS AT HOME: Prior to Admission medications   Medication Sig Start Date End Date Taking? Authorizing Provider  gabapentin (NEURONTIN) 300 MG capsule Take 300 mg by mouth 3 (three) times daily.    Yes [provider]  DULoxetine (CYMBALTA) 20 MG capsule Take 1 capsule (20 mg total) by mouth daily. Patient not taking: Reported on 06/17/2017 03/13/15   Boykin Nearing, MD  methocarbamol (ROBAXIN) 500 MG tablet Take 1 tablet (500 mg total) by mouth 3 (three) times daily. X7 days then prn spasm 08/19/17   Freeman Caldron M, PA-C  naproxen (NAPROSYN) 500 MG tablet Take 1 tablet (500 mg total) by mouth 2 (two) times daily with a meal. X 7days then prn pain 08/19/17   Argentina Donovan, PA-C  promethazine (PHENERGAN) 25 MG tablet Take 25 mg by mouth every 6 (six) hours as needed for nausea or vomiting.    [provider]     Objective:  EXAM:   Vitals:   08/19/17 0903  BP: 118/83  Pulse: 96  Temp: 98.2 F (36.8 C)  TempSrc: Oral  SpO2: 99%  Weight: 154  lb 6.4 oz (70 kg)    General appearance : A&OX3. NAD. Non-toxic-appearing, tearful at time-unable to bend neck, turn neck/flex/extend or perform ROM in any plane.  Looking straight ahead.   HEENT: Atraumatic and Normocephalic.  PERRLA. EOM intact.  TM clear B. Mouth-MMM, post pharynx WNL w/o erythema, No PND. Neck: supple, no JVD. No cervical lymphadenopathy. No thyromegaly. C-spine-t-spine-diffuse TTP all along back without any one specific place of tenderness.  There is spasm of the trapezius muscle throughout, but physical findings and overall unimpressive as compared to subjective complaints.  Three is no rash of the  skin. Chest/Lungs:  Breathing-non-labored, Good air entry bilaterally, breath sounds normal without rales, rhonchi, or wheezing  CVS: S1 S2 regular, no murmurs, gallops, rubs  Extremities: Bilateral Lower Ext shows no edema, both legs are warm to touch with = pulse throughout Neurology:  CN II-XII grossly intact, Non focal.   Psych:  TP linear. J/I WNL. Normal speech. Appropriate eye contact and affect.  Skin:  No Rash  Data Review No results found for: HGBA1C   Assessment & Plan   1. Neck pain/torticollis Not c/w meningitis, epiglottitis, or toxic/patholologic signs - ketorolac (TORADOL) injection 60 mg; Inject 2 mLs (60 mg total) into the muscle once. - naproxen (NAPROSYN) 500 MG tablet; Take 1 tablet (500 mg total) by mouth 2 (two) times daily with a meal. X 7days then prn pain  Dispense: 60 tablet; Refill: 0 - methocarbamol (ROBAXIN) 500 MG tablet; Take 1 tablet (500 mg total) by mouth 3 (three) times daily. X7 days then prn spasm  Dispense: 60 tablet; Refill: 0  2. Muscle spasm - ketorolac (TORADOL) injection 60 mg; Inject 2 mLs (60 mg total) into the muscle once. - naproxen (NAPROSYN) 500 MG tablet; Take 1 tablet (500 mg total) by mouth 2 (two) times daily with a meal. X 7days then prn pain  Dispense: 60 tablet; Refill: 0 - methocarbamol (ROBAXIN) 500 MG tablet; Take 1 tablet (500 mg total) by mouth 3 (three) times daily. X7 days then prn spasm  Dispense: 60 tablet; Refill: 0  3. Body aches-ongoing X several months Consider referral if appropriate after labs are back-will defer referral to Dr Jarold Song as PCP and the patient is seeing her next week for a follow-up.   - TSH - CBC with Differential/Platelet - Sedimentation Rate - ANA Comprehensive Panel -Check BMP -Check Magnesium   Patient have been counseled extensively about nutrition and exercise  Return in about 5 days (around 08/24/2017) for keep appt with Dr Jarold Song.  The patient was given clear instructions to go to ER or  return to medical center if symptoms don't improve, worsen or new problems develop. The patient verbalized understanding. The patient was told to call to get lab results if they haven't heard anything in the next week.     Freeman Caldron, PA-C [facility] [place], [place]   08/19/2017, 9:37 AM

## 2017-08-20 LAB — BASIC METABOLIC PANEL
BUN/Creatinine Ratio: 14 (ref 9–23)
BUN: 11 mg/dL (ref 6–20)
CO2: 24 mmol/L (ref 20–29)
Calcium: 9.5 mg/dL (ref 8.7–10.2)
Chloride: 105 mmol/L (ref 96–106)
Creatinine, Ser: 0.76 mg/dL (ref 0.57–1.00)
GFR calc Af Amer: 120 mL/min/{1.73_m2} (ref >59)
GFR calc non Af Amer: 104 mL/min/{1.73_m2} (ref >59)
Glucose: 76 mg/dL (ref 65–99)
Potassium: 4.4 mmol/L (ref 3.5–5.2)
Sodium: 144 mmol/L (ref 134–144)

## 2017-08-20 LAB — CBC WITH DIFFERENTIAL/PLATELET
Basophils Absolute: 0 10*3/uL (ref 0.0–0.2)
Basos: 0 %
EOS (ABSOLUTE): 0 10*3/uL (ref 0.0–0.4)
Eos: 0 %
Hematocrit: 37.5 % (ref 34.0–46.6)
Hemoglobin: 13.4 g/dL (ref 11.1–15.9)
Immature Grans (Abs): 0 10*3/uL (ref 0.0–0.1)
Immature Granulocytes: 0 %
Lymphocytes Absolute: 1.8 10*3/uL (ref 0.7–3.1)
Lymphs: 36 %
MCH: 31.9 pg (ref 26.6–33.0)
MCHC: 35.7 g/dL (ref 31.5–35.7)
MCV: 89 fL (ref 79–97)
Monocytes Absolute: 0.3 10*3/uL (ref 0.1–0.9)
Monocytes: 6 %
Neutrophils Absolute: 2.9 10*3/uL (ref 1.4–7.0)
Neutrophils: 58 %
Platelets: 250 10*3/uL (ref 150–379)
RBC: 4.2 x10E6/uL (ref 3.77–5.28)
RDW: 13.2 % (ref 12.3–15.4)
WBC: 4.9 10*3/uL (ref 3.4–10.8)

## 2017-08-20 LAB — SEDIMENTATION RATE: Sed Rate: 2 mm/hr (ref 0–32)

## 2017-08-20 LAB — TSH: TSH: 0.991 u[IU]/mL (ref 0.450–4.500)

## 2017-08-20 LAB — ANA COMPREHENSIVE PANEL
Anti JO-1: <0.2 AI (ref 0.0–0.9)
Centromere Ab Screen: <0.2 AI (ref 0.0–0.9)
Chromatin Ab SerPl-aCnc: <0.2 AI (ref 0.0–0.9)
ENA RNP Ab: 1 AI — ABNORMAL HIGH (ref 0.0–0.9)
ENA SM Ab Ser-aCnc: <0.2 AI (ref 0.0–0.9)
ENA SSA (RO) Ab: <0.2 AI (ref 0.0–0.9)
ENA SSB (LA) Ab: <0.2 AI (ref 0.0–0.9)
Scleroderma SCL-70: <0.2 AI (ref 0.0–0.9)
dsDNA Ab: 1 IU/mL (ref 0–9)

## 2017-08-20 LAB — MAGNESIUM: Magnesium: 2 mg/dL (ref 1.6–2.3)

## 2017-08-24 ENCOUNTER — Encounter: Payer: Self-pay | Admitting: Internal Medicine

## 2017-08-24 ENCOUNTER — Ambulatory Visit: Payer: Self-pay | Attending: Family Medicine | Admitting: Internal Medicine

## 2017-08-24 VITALS — BP 123/79 | HR 99 | Temp 98.8°F | Resp 18 | Ht 70.0 in | Wt 152.0 lb

## 2017-08-24 DIAGNOSIS — F410 Panic disorder [episodic paroxysmal anxiety] without agoraphobia: Secondary | ICD-10-CM | POA: Insufficient documentation

## 2017-08-24 DIAGNOSIS — Z79899 Other long term (current) drug therapy: Secondary | ICD-10-CM | POA: Insufficient documentation

## 2017-08-24 DIAGNOSIS — G894 Chronic pain syndrome: Secondary | ICD-10-CM

## 2017-08-24 DIAGNOSIS — M5416 Radiculopathy, lumbar region: Secondary | ICD-10-CM | POA: Insufficient documentation

## 2017-08-24 DIAGNOSIS — F4310 Post-traumatic stress disorder, unspecified: Secondary | ICD-10-CM | POA: Insufficient documentation

## 2017-08-24 DIAGNOSIS — M546 Pain in thoracic spine: Secondary | ICD-10-CM | POA: Insufficient documentation

## 2017-08-24 DIAGNOSIS — F329 Major depressive disorder, single episode, unspecified: Secondary | ICD-10-CM | POA: Insufficient documentation

## 2017-08-24 DIAGNOSIS — Z886 Allergy status to analgesic agent status: Secondary | ICD-10-CM | POA: Insufficient documentation

## 2017-08-24 DIAGNOSIS — M797 Fibromyalgia: Secondary | ICD-10-CM | POA: Insufficient documentation

## 2017-08-24 DIAGNOSIS — M542 Cervicalgia: Secondary | ICD-10-CM

## 2017-08-24 DIAGNOSIS — I10 Essential (primary) hypertension: Secondary | ICD-10-CM | POA: Insufficient documentation

## 2017-08-24 MED ORDER — DULOXETINE HCL 30 MG PO CPEP: 30.0000 mg | ORAL_CAPSULE | Freq: Every day | ORAL | 2 refills | Status: DC

## 2017-08-24 NOTE — Progress Notes (Signed)
[name], is a 31 y.o. female  EHO:[id number]  BBC:[id number]  DOB - 1986/08/08  Chief Complaint  Patient presents with  . Arm Pain      Subjective:   [name] is a 31 y.o. female here today for a follow up visit. Lives at home with two nephews and an autistic sister. She takes care of all of them and still works and go to school: [employer]. Recently she noticed she is not able to use her right hand, it became weak and heavy. She describes a severe neck and upper back pain that started 2 years ago and has gradually worsened since then. She describes the pain as sharp with associated symptoms of limited range of motion, paresthesias, and weakness. There were no precipitating or exacerbating factors. No known injury. She has been unable to turn or bend her head/neck area. No headache associated, No chest pain, No abdominal pain, No Cough, no SOB.  Problem  Neck Pain Without Injury   ALLERGIES: Allergies  Allergen Reactions  . Aspirin Hives    PAST MEDICAL HISTORY: Past Medical History:  Diagnosis Date  . Arrhythmia Dx 2015  . Back pain   . Breast mass   . Depression Dx 2000  . Fibromyalgia   . Headache    migraines  . Heart murmur Dx 2015  . 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 Hypertension Dx 50388  . Lumbar radiculopathy   . Panic attack Dx 2006  . PTSD (post-traumatic stress disorder) Dx 2013  . Seizures ([place]) 2001  . Vitamin D deficiency 01/24/2015    MEDICATIONS AT HOME: Prior to Admission medications   Medication Sig Start Date End Date Taking? Authorizing Provider  gabapentin (NEURONTIN) 300 MG capsule Take 300 mg by mouth 3 (three) times daily.    Yes [provider]  methocarbamol (ROBAXIN) 500 MG tablet Take 1 tablet (500 mg total) by mouth 3 (three) times daily. X7 days then prn spasm 08/19/17  Yes McClung, Angela M, PA-C  naproxen (NAPROSYN) 500 MG tablet Take 1 tablet (500 mg total) by mouth 2  (two) times daily with a meal. X 7days then prn pain 08/19/17  Yes McClung, Angela M, PA-C  promethazine (PHENERGAN) 25 MG tablet Take 25 mg by mouth every 6 (six) hours as needed for nausea or vomiting.   Yes [provider]  DULoxetine (CYMBALTA) 30 MG capsule Take 1 capsule (30 mg total) by mouth daily. 08/24/17   Tresa Garter, MD    Objective:   Vitals:   08/24/17 1521  BP: 123/79  Pulse: 99  Resp: 18  Temp: 98.8 F (37.1 C)  TempSrc: Oral  SpO2: 100%  Weight: 152 lb (68.9 kg)  Height: 5\' 10"  (1.778 m)   Exam General appearance : Awake, alert, not in any distress. Speech Clear. Not toxic looking HEENT: Atraumatic and Normocephalic, pupils equally reactive to light and accomodation Neck: ?? Stiff, unable to touch chest with chin, no JVD. No cervical lymphadenopathy.  Chest: Good air entry bilaterally, no added sounds  CVS: S1 S2 regular, no murmurs.  Abdomen: Bowel sounds present, Non tender and not distended with no gaurding, rigidity or rebound. Extremities: Right upper limb power 3/5. B/L Lower Ext shows no edema, both legs are warm to touch Neurology: Awake alert, and oriented X 3, CN II-XII intact, Non focal Skin: No Rash  Data Review No results found for: HGBA1C  Assessment & Plan  1. Neck pain without injury  - MR Cervical Spine Wo Contrast; Future - DULoxetine (CYMBALTA) 30 MG capsule; Take 1 capsule (30 mg total) by mouth daily.  Dispense: 30 capsule; Refill: 2  2. Chronic pain syndrome  - DULoxetine (CYMBALTA) 30 MG capsule; Take 1 capsule (30 mg total) by mouth daily.  Dispense: 30 capsule; Refill: 2  Patient have been counseled extensively about nutrition and exercise. Other issues discussed during this visit include: low cholesterol diet, weight control and daily exercise, importance of adherence with medications and regular follow-up.   Return in about 6 months (around 02/22/2018) for Follow up Pain and comorbidities.  The patient was  given clear instructions to go to ER or return to medical center if symptoms don't improve, worsen or new problems develop. The patient verbalized understanding. The patient was told to call to get lab results if they haven't heard anything in the next week.   This note has been created with Surveyor, quantity. Any transcriptional errors are unintentional.    Angelica Chessman, MD, [place], [place], [place], [place] and [place], [place]   08/24/2017, 3:40 PM

## 2017-08-24 NOTE — Patient Instructions (Signed)
Cervical Sprain A cervical sprain is a stretch or tear in one or more of the tough, cord-like tissues that connect bones (ligaments) in the neck. Cervical sprains can range from mild to severe. Severe cervical sprains can cause the spinal bones (vertebrae) in the neck to be unstable. This can lead to spinal cord damage and can result in serious nervous system problems. The amount of time that it takes for a cervical sprain to get better depends on the cause and extent of the injury. Most cervical sprains heal in 4-6 weeks. What are the causes? Cervical sprains may be caused by an injury (trauma), such as from a motor vehicle accident, a fall, or sudden forward and backward whipping movement of the head and neck (whiplash injury). Mild cervical sprains may be caused by wear and tear over time, such as from poor posture, sitting in a chair that does not provide support, or looking up or down for long periods of time. What increases the risk? The following factors may make you more likely to develop this condition:  Participating in activities that have a high risk of trauma to the neck. These include contact sports, auto racing, gymnastics, and diving.  Taking risks when driving or riding in a motor vehicle, such as speeding.  Having osteoarthritis of the spine.  Having poor strength and flexibility of the neck.  A previous neck injury.  Having poor posture.  Spending a lot of time in certain positions that put stress on the neck, such as sitting at a computer for long periods of time. What are the signs or symptoms? Symptoms of this condition include:  Pain, soreness, stiffness, tenderness, swelling, or a burning sensation in the front, back, or sides of the neck.  Sudden tightening of neck muscles that you cannot control (muscle spasms).  Pain in the shoulders or upper back.  Limited ability to move the neck.  Headache.  Dizziness.  Nausea.  Vomiting.  Weakness, numbness, or  tingling in a hand or an arm. Symptoms may develop right away after injury, or they may develop over a few days. In some cases, symptoms may go away with treatment and return (recur) over time. How is this diagnosed? This condition may be diagnosed based on:  Your medical history.  Your symptoms.  Any recent injuries or known neck problems that you have, such as arthritis in the neck.  A physical exam.  Imaging tests, such as:  X-rays.  MRI.  CT scan. How is this treated? This condition is treated by resting and icing the injured area and doing physical therapy exercises. Depending on the severity of your condition, treatment may also include:  Keeping your neck in place (immobilized) for periods of time. This may be done using:  A cervical collar. This supports your chin and the back of your head.  A cervical traction device. This is a sling that holds up your head. This removes weight and pressure from your neck, and it may help to relieve pain.  Medicines that help to relieve pain and inflammation.  Medicines that help to relax your muscles (muscle relaxants).  Surgery. This is rare. Follow these instructions at home: If you have a cervical collar:   Wear it as told by your health care provider. Do not remove the collar unless instructed by your health care provider.  Ask your health care provider before you make any adjustments to your collar.  If you have long hair, keep it outside of the collar.    Ask your health care provider if you can remove the collar for cleaning and bathing. If you are allowed to remove the collar for cleaning or bathing:  Follow instructions from your health care provider about how to remove the collar safely.  Clean the collar by wiping it with mild soap and water and drying it completely.  If your collar has removable pads, remove them every 1-2 days and wash them by hand with soap and water. Let them air-dry completely before you put  them back in the collar.  Check your skin under the collar for irritation or sores. If you see any, tell your health care provider. Managing pain, stiffness, and swelling   If directed, use a cervical traction device as told by your health care provider.  If directed, apply heat to the affected area before you do your physical therapy or as often as told by your health care provider. Use the heat source that your health care provider recommends, such as a moist heat pack or a heating pad.  Place a towel between your skin and the heat source.  Leave the heat on for 20-30 minutes.  Remove the heat if your skin turns bright red. This is especially important if you are unable to feel pain, heat, or cold. You may have a greater risk of getting burned.  If directed, put ice on the affected area:  Put ice in a plastic bag.  Place a towel between your skin and the bag.  Leave the ice on for 20 minutes, 2-3 times a day. Activity   Do not drive while wearing a cervical collar. If you do not have a cervical collar, ask your health care provider if it is safe to drive while your neck heals.  Do not drive or use heavy machinery while taking prescription pain medicine or muscle relaxants, unless your health care provider approves.  Do not lift anything that is heavier than 10 lb (4.5 kg) until your health care provider tells you that it is safe.  Rest as directed by your health care provider. Avoid positions and activities that make your symptoms worse. Ask your health care provider what activities are safe for you.  If physical therapy was prescribed, do exercises as told by your health care provider or physical therapist. General instructions   Take over-the-counter and prescription medicines only as told by your health care provider.  Do not use any products that contain nicotine or tobacco, such as cigarettes and e-cigarettes. These can delay healing. If you need help quitting, ask your  health care provider.  Keep all follow-up visits as told by your health care provider or physical therapist. This is important. How is this prevented? To prevent a cervical sprain from happening again:  Use and maintain good posture. Make any needed adjustments to your workstation to help you use good posture.  Exercise regularly as directed by your health care provider or physical therapist.  Avoid risky activities that may cause a cervical sprain. Contact a health care provider if:  You have symptoms that get worse or do not get better after 2 weeks of treatment.  You have pain that gets worse or does not get better with medicine.  You develop new, unexplained symptoms.  You have sores or irritated skin on your neck from wearing your cervical collar. Get help right away if:  You have severe pain.  You develop numbness, tingling, or weakness in any part of your body.  You cannot move   a part of your body (you have paralysis).  You have neck pain along with:  Severe dizziness.  Headache. Summary  A cervical sprain is a stretch or tear in one or more of the tough, cord-like tissues that connect bones (ligaments) in the neck.  Cervical sprains may be caused by an injury (trauma), such as from a motor vehicle accident, a fall, or sudden forward and backward whipping movement of the head and neck (whiplash injury).  Symptoms may develop right away after injury, or they may develop over a few days.  This condition is treated by resting and icing the injured area and doing physical therapy exercises. This information is not intended to replace advice given to you by your health care provider. Make sure you discuss any questions you have with your health care provider. Document Released: 09/07/2007 Document Revised: 07/09/2016 Document Reviewed: 07/09/2016 Elsevier Interactive Patient Education  2017 Elsevier Inc.  

## 2017-08-25 NOTE — Telephone Encounter (Signed)
Patient verified DOB Patient is aware of blood work showing no evidence of rheumatological concerns. Patient states she will have the MRI completed next week but would still like to be referred to a specialist. MA will forward the request to the provider for approval.

## 2017-08-29 DIAGNOSIS — R52 Pain, unspecified: Secondary | ICD-10-CM | POA: Insufficient documentation

## 2017-08-29 DIAGNOSIS — Z1331 Encounter for screening for depression: Secondary | ICD-10-CM

## 2017-08-29 HISTORY — DX: Pain, unspecified: R52

## 2017-08-29 HISTORY — DX: Encounter for screening for depression: Z13.31

## 2017-08-31 ENCOUNTER — Other Ambulatory Visit: Payer: Self-pay | Admitting: Internal Medicine

## 2017-08-31 ENCOUNTER — Ambulatory Visit (HOSPITAL_COMMUNITY)
Admission: RE | Admit: 2017-08-31 | Discharge: 2017-08-31 | Disposition: A | Discharge status: Home / Self Care | Payer: No Typology Code available for payment source | Source: Ambulatory Visit | Attending: Internal Medicine | Admitting: Internal Medicine

## 2017-08-31 DIAGNOSIS — M50222 Other cervical disc displacement at C5-C6 level: Secondary | ICD-10-CM | POA: Insufficient documentation

## 2017-08-31 DIAGNOSIS — M5412 Radiculopathy, cervical region: Secondary | ICD-10-CM

## 2017-08-31 DIAGNOSIS — M542 Cervicalgia: Secondary | ICD-10-CM | POA: Insufficient documentation

## 2017-08-31 NOTE — Telephone Encounter (Signed)
MRI Report will dictate which specialist to refer patient to.

## 2017-09-01 ENCOUNTER — Telehealth: Payer: Self-pay | Admitting: Family Medicine

## 2017-09-01 NOTE — Telephone Encounter (Signed)
Pt called to get the MRI result, please follow up

## 2017-09-01 NOTE — Telephone Encounter (Signed)
Patient verified DOB Patient was informed that her MRI showed small disc extrusion and slight impression on the spinal cord but no nerve impingement. Also patient was notified of referral placed to Neurosurgery specialist for further evaluation and management.  Patient had no further questions or concerns.

## 2017-09-01 NOTE — Telephone Encounter (Signed)
-----   Message from Tresa Garter, MD sent at 08/31/2017  3:53 PM EDT ----- Please inform patient that her MRI showed small disc extrusion and slight impression on the spinal cord but no nerve impingement. We have placed referral to Neurosurgery specialist for further evaluation and management.

## 2017-09-02 ENCOUNTER — Other Ambulatory Visit: Payer: Self-pay | Admitting: Surgery

## 2017-09-02 ENCOUNTER — Ambulatory Visit: Payer: No Typology Code available for payment source | Admitting: Family Medicine

## 2017-09-04 ENCOUNTER — Telehealth: Payer: Self-pay | Admitting: Family Medicine

## 2017-09-04 NOTE — Telephone Encounter (Signed)
Pt called to speak with the pcp or the nurse about some note that she saw on my chart, please follow up

## 2017-09-04 NOTE — Telephone Encounter (Signed)
This patient was seen last by jegede and McClung. Please follow up

## 2017-09-08 ENCOUNTER — Encounter: Payer: Self-pay | Admitting: Family Medicine

## 2017-09-08 NOTE — Telephone Encounter (Signed)
Patient verified DOB Patient received clarity regarding MRI results. Patient advised to pick up financial packet and report to walk in financial on Friday.  Patient expressed her understanding and had no further questions.

## 2017-09-11 ENCOUNTER — Ambulatory Visit: Payer: No Typology Code available for payment source | Attending: Family Medicine

## 2017-09-11 ENCOUNTER — Other Ambulatory Visit: Payer: Self-pay

## 2017-10-01 ENCOUNTER — Ambulatory Visit: Payer: No Typology Code available for payment source

## 2017-10-03 ENCOUNTER — Encounter (HOSPITAL_COMMUNITY): Payer: Self-pay

## 2017-10-03 DIAGNOSIS — Z5321 Procedure and treatment not carried out due to patient leaving prior to being seen by health care provider: Secondary | ICD-10-CM | POA: Insufficient documentation

## 2017-10-03 DIAGNOSIS — R109 Unspecified abdominal pain: Secondary | ICD-10-CM | POA: Insufficient documentation

## 2017-10-03 LAB — CBC
HCT: 33.5 % — ABNORMAL LOW (ref 36.0–46.0)
Hemoglobin: 12.1 g/dL (ref 12.0–15.0)
MCH: 32.1 pg (ref 26.0–34.0)
MCHC: 36.1 g/dL — ABNORMAL HIGH (ref 30.0–36.0)
MCV: 88.9 fL (ref 78.0–100.0)
Platelets: 256 10*3/uL (ref 150–400)
RBC: 3.77 MIL/uL — ABNORMAL LOW (ref 3.87–5.11)
RDW: 12.2 % (ref 11.5–15.5)
WBC: 6.2 10*3/uL (ref 4.0–10.5)

## 2017-10-03 LAB — COMPREHENSIVE METABOLIC PANEL
ALT: 10 U/L — ABNORMAL LOW (ref 14–54)
AST: 16 U/L (ref 15–41)
Albumin: 4 g/dL (ref 3.5–5.0)
Alkaline Phosphatase: 57 U/L (ref 38–126)
Anion gap: 8 (ref 5–15)
BUN: 10 mg/dL (ref 6–20)
CO2: 25 mmol/L (ref 22–32)
Calcium: 9 mg/dL (ref 8.9–10.3)
Chloride: 108 mmol/L (ref 101–111)
Creatinine, Ser: 0.69 mg/dL (ref 0.44–1.00)
GFR calc Af Amer: >60 mL/min (ref >60)
GFR calc non Af Amer: >60 mL/min (ref >60)
Glucose, Bld: 93 mg/dL (ref 65–99)
Potassium: 3.9 mmol/L (ref 3.5–5.1)
Sodium: 141 mmol/L (ref 135–145)
Total Bilirubin: 0.9 mg/dL (ref 0.3–1.2)
Total Protein: 7 g/dL (ref 6.5–8.1)

## 2017-10-03 LAB — LIPASE, BLOOD: Lipase: 20 U/L (ref 11–51)

## 2017-10-03 NOTE — ED Triage Notes (Signed)
States pain in [place] area since this am has been coming and going for months n/v/d no fever noted no dysuria.

## 2017-10-04 ENCOUNTER — Emergency Department (HOSPITAL_COMMUNITY)
Admission: EM | Admit: 2017-10-04 | Discharge: 2017-10-04 | Disposition: A | Discharge status: Home / Self Care | Payer: No Typology Code available for payment source | Attending: Emergency Medicine | Admitting: Emergency Medicine

## 2017-10-07 ENCOUNTER — Ambulatory Visit: Payer: Self-pay | Attending: Family Medicine | Admitting: Family Medicine

## 2017-10-07 ENCOUNTER — Encounter: Payer: Self-pay | Admitting: Family Medicine

## 2017-10-07 VITALS — BP 123/77 | HR 97 | Temp 98.1°F | Ht 70.0 in | Wt 153.8 lb

## 2017-10-07 DIAGNOSIS — Z791 Long term (current) use of non-steroidal anti-inflammatories (NSAID): Secondary | ICD-10-CM | POA: Insufficient documentation

## 2017-10-07 DIAGNOSIS — F329 Major depressive disorder, single episode, unspecified: Secondary | ICD-10-CM | POA: Insufficient documentation

## 2017-10-07 DIAGNOSIS — Z79899 Other long term (current) drug therapy: Secondary | ICD-10-CM | POA: Insufficient documentation

## 2017-10-07 DIAGNOSIS — Z9889 Other specified postprocedural states: Secondary | ICD-10-CM | POA: Insufficient documentation

## 2017-10-07 DIAGNOSIS — M5416 Radiculopathy, lumbar region: Secondary | ICD-10-CM | POA: Insufficient documentation

## 2017-10-07 DIAGNOSIS — G8929 Other chronic pain: Secondary | ICD-10-CM | POA: Insufficient documentation

## 2017-10-07 DIAGNOSIS — F410 Panic disorder [episodic paroxysmal anxiety] without agoraphobia: Secondary | ICD-10-CM | POA: Insufficient documentation

## 2017-10-07 DIAGNOSIS — K219 Gastro-esophageal reflux disease without esophagitis: Secondary | ICD-10-CM | POA: Insufficient documentation

## 2017-10-07 DIAGNOSIS — M542 Cervicalgia: Secondary | ICD-10-CM

## 2017-10-07 DIAGNOSIS — M797 Fibromyalgia: Secondary | ICD-10-CM | POA: Insufficient documentation

## 2017-10-07 DIAGNOSIS — M5412 Radiculopathy, cervical region: Secondary | ICD-10-CM | POA: Insufficient documentation

## 2017-10-07 DIAGNOSIS — M62838 Other muscle spasm: Secondary | ICD-10-CM | POA: Insufficient documentation

## 2017-10-07 DIAGNOSIS — F4310 Post-traumatic stress disorder, unspecified: Secondary | ICD-10-CM | POA: Insufficient documentation

## 2017-10-07 DIAGNOSIS — I10 Essential (primary) hypertension: Secondary | ICD-10-CM | POA: Insufficient documentation

## 2017-10-07 DIAGNOSIS — Z886 Allergy status to analgesic agent status: Secondary | ICD-10-CM | POA: Insufficient documentation

## 2017-10-07 MED ORDER — METHOCARBAMOL 500 MG PO TABS: 500.0000 mg | ORAL_TABLET | Freq: Three times a day (TID) | ORAL | 1 refills | Status: DC | PRN

## 2017-10-07 NOTE — Patient Instructions (Signed)
Cervical Radiculopathy  Cervical radiculopathy means that a nerve in the neck is pinched or bruised. This can cause pain or loss of feeling (numbness) that runs from your neck to your arm and fingers.  Follow these instructions at home:  Managing pain  ? Take over-the-counter and prescription medicines only as told by your doctor.  ? If directed, put ice on the injured or painful area.  ? Put ice in a plastic bag.  ? Place a towel between your skin and the bag.  ? Leave the ice on for 20 minutes, 2?3 times per day.  ? If ice does not help, you can try using heat. Take a warm shower or warm bath, or use a heat pack as told by your doctor.  ? You may try a gentle neck and shoulder massage.  Activity  ? Rest as needed. Follow instructions from your doctor about any activities to avoid.  ? Do exercises as told by your doctor or physical therapist.  General instructions  ? If you were given a soft collar, wear it as told by your doctor.  ? Use a flat pillow when you sleep.  ? Keep all follow-up visits as told by your doctor. This is important.  Contact a doctor if:  ? Your condition does not improve with treatment.  Get help right away if:  ? Your pain gets worse and is not controlled with medicine.  ? You lose feeling or feel weak in your hand, arm, face, or leg.  ? You have a fever.  ? You have a stiff neck.  ? You cannot control when you poop or pee (have incontinence).  ? You have trouble with walking, balance, or talking.  This information is not intended to replace advice given to you by your health care provider. Make sure you discuss any questions you have with your health care provider.  Document Released: 10/30/2011 Document Revised: 04/17/2016 Document Reviewed: 01/04/2015  Elsevier Interactive Patient Education ? 2018 Elsevier Inc.

## 2017-10-07 NOTE — Progress Notes (Signed)
Subjective:  Patient ID: [name], female    DOB: 1986/01/04  Age: 31 y.o. MRN: [id number]  CC: Neck Pain   HPI [name]  is a 31 year old right-handed female with a history of hypertension, PTSD, anxiety and depression who presents today complaining of chronic neck pain which reduces her range of motion to the right side but she is able to turn to the left and this radiates down both arms with associated tingling and is described as severe.  She recently had MRI of the C-spine which revealed findings below.  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 No stenosis.  She was referred to neurosurgery and is yet to obtain an appointment.  She also complains of a 'warm sensation' in her right groin which is chronic and intermittent and sometimes has bloating of her abdomen with associated swelling; denies reflux, constipation or diarrhea and does not take the Prilosec which was previously prescribed. She breaks down crying because she has had all sorts of body aches and is tired because there has been no solution. She has had workup of chronic pelvic pain which has been unrevealing.  She does not take the Cymbalta on her med list or the muscle relaxant because she states medications will mess me up. She receives gabapentin for mental health which she has been taking but states 'I am not depressed'; states she has been unable to work due to the various pains she has experienced.   Past Medical History:  Diagnosis Date  . Arrhythmia Dx 2015  . Back pain   . Breast mass   . Depression Dx 2000  . Fibromyalgia   . Headache    migraines  . Heart murmur Dx 2015  . 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 Hypertension Dx 95284  . Lumbar radiculopathy   . Panic attack Dx 2006  . PTSD (post-traumatic stress disorder) Dx 2013  . Seizures ([place]) 2001  . Vitamin D deficiency 01/24/2015    Past Surgical History:    Procedure Laterality Date  . ESOPHAGOGASTRODUODENOSCOPY N/A 08/14/2015   Procedure: ESOPHAGOGASTRODUODENOSCOPY (EGD);  Surgeon: Carol Ada, MD;  Location: [facility];  Service: Endoscopy;  Laterality: N/A;    Allergies  Allergen Reactions  . Aspirin Hives     Outpatient Medications Prior to Visit  Medication Sig Dispense Refill  . gabapentin (NEURONTIN) 300 MG capsule Take 300 mg by mouth 3 (three) times daily.     . DULoxetine (CYMBALTA) 30 MG capsule Take 1 capsule (30 mg total) by mouth daily. (Patient not taking: Reported on 10/07/2017) 30 capsule 2  . naproxen (NAPROSYN) 500 MG tablet Take 1 tablet (500 mg total) by mouth 2 (two) times daily with a meal. X 7days then prn pain (Patient not taking: Reported on 10/07/2017) 60 tablet 0  . promethazine (PHENERGAN) 25 MG tablet Take 25 mg by mouth every 6 (six) hours as needed for nausea or vomiting.    . methocarbamol (ROBAXIN) 500 MG tablet Take 1 tablet (500 mg total) by mouth 3 (three) times daily. X7 days then prn spasm (Patient not taking: Reported on 10/07/2017) 60 tablet 0   No facility-administered medications prior to visit.     ROS Review of Systems  Constitutional: Negative for activity change, appetite change and fatigue.  HENT: Negative for congestion, sinus pressure and sore throat.   Eyes: Negative for visual disturbance.  Respiratory: Negative for cough, chest tightness, shortness of  breath and wheezing.   Cardiovascular: Negative for chest pain and palpitations.  Gastrointestinal: Positive for abdominal distention. Negative for abdominal pain and constipation.  Endocrine: Negative for polydipsia.  Genitourinary: Negative for dysuria and frequency.  Musculoskeletal: Positive for neck pain and neck stiffness.       See hpi  Skin: Negative for rash.  Neurological: Positive for numbness. Negative for tremors and light-headedness.  Hematological: Does not bruise/bleed easily.  Psychiatric/Behavioral: Positive for  dysphoric mood. Negative for agitation and behavioral problems.    Objective:  BP 123/77   Pulse 97   Temp 98.1 F (36.7 C) (Oral)   Ht 5\' 10"  (1.778 m)   Wt 153 lb 12.8 oz (69.8 kg)   SpO2 98%   BMI 22.07 kg/m   BP/Weight 10/07/2017 10/04/2017 27/25/3664  Systolic BP 403 - 474  Diastolic BP 77 - 90  Wt. (Lbs) 153.8 - 152  BMI 22.07 21.81 -      Physical Exam  Constitutional: She is oriented to person, place, and time. She appears well-developed and well-nourished.  Neck: Muscular tenderness present. Decreased range of motion present.  Cardiovascular: Normal rate, normal heart sounds and intact distal pulses.  No murmur heard. Pulmonary/Chest: Effort normal and breath sounds normal. She has no wheezes. She has no rales. She exhibits no tenderness.  Abdominal: Soft. Bowel sounds are normal. She exhibits no distension and no mass. There is no tenderness.  Musculoskeletal: She exhibits tenderness (tenderness on deep palpation of medial aspect of hip joint).  Normal hand grip bilaterally  Neurological: She is alert and oriented to person, place, and time.  No sensory loss in upper or lower extremities Normal motor strength globally  Psychiatric: She exhibits a depressed mood.      EXAM: MRI CERVICAL SPINE WITHOUT CONTRAST  TECHNIQUE: Multiplanar, multisequence MR imaging of the cervical spine was performed. No intravenous contrast was administered.  COMPARISON:  Head and neck CTA 03/17/2017. Cervical spine radiographs 02/07/2013.  FINDINGS: The study is mildly motion degraded despite repeated imaging attempts.  Alignment: Chronic reversal of the normal cervical lordosis. No listhesis.  Vertebrae: No fracture, suspicious osseous lesion, or significant marrow edema.  Cord: Normal signal.  Posterior Fossa, vertebral arteries, paraspinal tissues: Mildly prominent level I 2 lymph nodes bilaterally, measuring up to 1.1 cm in short axis on the left and  similar to the prior CTA, likely benign.  Disc levels:  C2-3 through C4-5:  Negative.  C5-6: Small central disc extrusion contacts and slightly indents the ventral spinal cord without significant stenosis.  C6-7 and C7-T1:  Negative.  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 No stenosis.   Electronically Signed   By: Logan Bores M.D.   On: 08/31/2017 13:32  Assessment & Plan:   1. Neck pain Secondary to cervical radiculopathy She has underlying chronic pain and fibromyalgia is a likely consideration Recommend water aerobics, massage  2. Muscle spasm Advised to apply heat - methocarbamol (ROBAXIN) 500 MG tablet; Take 1 tablet (500 mg total) every 8 (eight) hours as needed by mouth for muscle spasms.  Dispense: 60 tablet; Refill: 1  3. Cervical radiculopathy She needs to be seen by neurosurgery Advised to apply for the [facility] financial discount to facilitate this process Patient to present to ED if symptoms worsen Meanwhile continue gabapentin; declines taking Cymbalta - methocarbamol (ROBAXIN) 500 MG tablet; Take 1 tablet (500 mg total) every 8 (eight) hours as needed by mouth for muscle spasms.  Dispense: 60 tablet; Refill: 1  4. Gastroesophageal reflux disease, esophagitis presence not specified Refuses to take PPI - H. pylori breath test   Meds ordered this encounter  Medications  . methocarbamol (ROBAXIN) 500 MG tablet    Sig: Take 1 tablet (500 mg total) every 8 (eight) hours as needed by mouth for muscle spasms.    Dispense:  60 tablet    Refill:  1    Follow-up: Return in about 6 weeks (around 11/18/2017) for follow up on cervical radiculopathy.   Arnoldo Morale MD

## 2017-10-08 LAB — H. PYLORI BREATH TEST: H pylori Breath Test: NEGATIVE

## 2017-10-09 ENCOUNTER — Encounter: Payer: Self-pay | Admitting: Family Medicine

## 2017-10-19 ENCOUNTER — Telehealth: Payer: Self-pay | Admitting: *Deleted

## 2017-10-19 ENCOUNTER — Ambulatory Visit: Payer: Self-pay | Admitting: Neurology

## 2017-10-19 NOTE — Telephone Encounter (Signed)
Patient no show f/u appt on 11/26.

## 2017-10-22 ENCOUNTER — Encounter: Payer: Self-pay | Admitting: Neurology

## 2017-10-30 ENCOUNTER — Ambulatory Visit (INDEPENDENT_AMBULATORY_CARE_PROVIDER_SITE_OTHER): Payer: Self-pay | Admitting: Clinical

## 2017-10-30 ENCOUNTER — Ambulatory Visit: Payer: Self-pay | Admitting: Obstetrics & Gynecology

## 2017-10-30 VITALS — BP 157/79 | HR 99 | Wt 151.9 lb

## 2017-10-30 DIAGNOSIS — R102 Pelvic and perineal pain: Secondary | ICD-10-CM

## 2017-10-30 DIAGNOSIS — F4321 Adjustment disorder with depressed mood: Secondary | ICD-10-CM

## 2017-10-30 DIAGNOSIS — F54 Psychological and behavioral factors associated with disorders or diseases classified elsewhere: Secondary | ICD-10-CM

## 2017-10-30 DIAGNOSIS — R928 Other abnormal and inconclusive findings on diagnostic imaging of breast: Secondary | ICD-10-CM

## 2017-10-30 DIAGNOSIS — F4329 Adjustment disorder with other symptoms: Secondary | ICD-10-CM

## 2017-10-30 DIAGNOSIS — F4381 Prolonged grief disorder: Secondary | ICD-10-CM

## 2017-10-30 NOTE — Progress Notes (Signed)
Patient ID: [name], female   DOB: [birth year], 31 y.o.   MRN: [id number]  Chief Complaint  Patient presents with  . Pelvic Pain    HPI [name] is a 31 y.o. female.   HPI  Past Medical History:  Diagnosis Date  . Arrhythmia Dx 2015  . Back pain   . Breast mass   . Depression Dx 2000  . Fibromyalgia   . Headache    migraines  . Heart murmur Dx 2015  . 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 [address]  . Lumbar radiculopathy   . Panic attack Dx 2006  . PTSD (post-traumatic stress disorder) Dx 2013  . Seizures ([place]) 2001  . Vitamin D deficiency 01/24/2015    Past Surgical History:  Procedure Laterality Date  . ESOPHAGOGASTRODUODENOSCOPY N/A 08/14/2015   Procedure: ESOPHAGOGASTRODUODENOSCOPY (EGD);  Surgeon: Carol Ada, MD;  Location: [facility];  Service: Endoscopy;  Laterality: N/A;    Family History  Problem Relation Age of Onset  . Heart attack Mother   . Heart disease Mother   . Sudden death Mother   . Sudden death Sister   . Heart attack Sister   . Sudden death Maternal Grandmother   . Fainting Maternal Grandmother   . Heart attack Maternal Grandmother   . Breast cancer Paternal Grandmother   . Breast cancer Cousin     Social History Social History   Tobacco Use  . Smoking status: Former Smoker    Last attempt to quit: 06/23/2012    Years since quitting: 5.3  . Smokeless tobacco: Never Used  Substance Use Topics  . Alcohol use: Yes    Comment: socially  . Drug use: No    Allergies  Allergen Reactions  . Aspirin Hives    Current Outpatient Medications  Medication Sig Dispense Refill  . gabapentin (NEURONTIN) 300 MG capsule Take 300 mg by mouth 3 (three) times daily.      No current facility-administered medications for this visit.     Review of Systems Review of Systems  Blood pressure (!) 157/79, pulse 99, weight 151 lb 14.4 oz (68.9 kg), last menstrual period 09/07/2017.  Physical Exam Physical  Exam  Data Reviewed  IMPRESSION: Small uterine fibroids.  No acute findings.  RECOMMENDATION: Right axillary ultrasound in 6 months.  I have discussed the findings, causes of breast pain and recommendations with the patient. Results were also provided in writing at the conclusion of the visit. If applicable, a reminder letter will be sent to the patient regarding the next appointment.  BI-RADS CATEGORY  3: Probably benign.   NEGATIVE FOR INTRAEPITHELIAL LESIONS OR MALIGNANCY. BENIGN REACTIVE CHANGES INCLUDING PARAKERATOSIS. Assessment        Plan    See other note       Emily Filbert 10/30/2017, 9:06 AM
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
## 2017-11-06 ENCOUNTER — Ambulatory Visit: Payer: Self-pay | Attending: Family Medicine | Admitting: Family Medicine

## 2017-11-06 ENCOUNTER — Other Ambulatory Visit: Payer: Self-pay | Admitting: Family Medicine

## 2017-11-06 ENCOUNTER — Encounter: Payer: Self-pay | Admitting: Family Medicine

## 2017-11-06 VITALS — BP 126/78 | HR 91 | Temp 98.1°F | Resp 18 | Ht 70.0 in | Wt 152.4 lb

## 2017-11-06 DIAGNOSIS — F329 Major depressive disorder, single episode, unspecified: Secondary | ICD-10-CM | POA: Insufficient documentation

## 2017-11-06 DIAGNOSIS — M79601 Pain in right arm: Secondary | ICD-10-CM | POA: Insufficient documentation

## 2017-11-06 DIAGNOSIS — M5382 Other specified dorsopathies, cervical region: Secondary | ICD-10-CM | POA: Insufficient documentation

## 2017-11-06 DIAGNOSIS — M6281 Muscle weakness (generalized): Secondary | ICD-10-CM | POA: Insufficient documentation

## 2017-11-06 DIAGNOSIS — R2231 Localized swelling, mass and lump, right upper limb: Secondary | ICD-10-CM | POA: Insufficient documentation

## 2017-11-06 DIAGNOSIS — F4310 Post-traumatic stress disorder, unspecified: Secondary | ICD-10-CM | POA: Insufficient documentation

## 2017-11-06 DIAGNOSIS — M5020 Other cervical disc displacement, unspecified cervical region: Secondary | ICD-10-CM

## 2017-11-06 DIAGNOSIS — M5080 Other cervical disc disorders, unspecified cervical region: Secondary | ICD-10-CM | POA: Insufficient documentation

## 2017-11-06 DIAGNOSIS — R202 Paresthesia of skin: Secondary | ICD-10-CM | POA: Insufficient documentation

## 2017-11-06 DIAGNOSIS — Z8489 Family history of other specified conditions: Secondary | ICD-10-CM

## 2017-11-06 DIAGNOSIS — I10 Essential (primary) hypertension: Secondary | ICD-10-CM | POA: Insufficient documentation

## 2017-11-06 DIAGNOSIS — M79609 Pain in unspecified limb: Secondary | ICD-10-CM

## 2017-11-06 DIAGNOSIS — Z820 Family history of epilepsy and other diseases of the nervous system: Secondary | ICD-10-CM | POA: Insufficient documentation

## 2017-11-06 DIAGNOSIS — Z87898 Personal history of other specified conditions: Secondary | ICD-10-CM

## 2017-11-06 MED ORDER — ACETAMINOPHEN 500 MG PO TABS
1000.0000 mg | ORAL_TABLET | Freq: Three times a day (TID) | ORAL | 0 refills | Status: DC | PRN
Start: 2017-11-06 — End: 2018-08-03

## 2017-11-06 MED ORDER — METHOCARBAMOL 500 MG PO TABS: 500.0000 mg | ORAL_TABLET | Freq: Three times a day (TID) | ORAL | 0 refills | Status: DC | PRN

## 2017-11-06 MED ORDER — GABAPENTIN 300 MG PO CAPS: 300.0000 mg | ORAL_CAPSULE | Freq: Every day | ORAL | 0 refills | Status: DC

## 2017-11-06 MED FILL — GABAPENTIN 300 MG CAPSULE: 300 | 30 days supply | Qty: 30 | Fill #0

## 2017-11-06 NOTE — Progress Notes (Signed)
Patient is here for neck pain cant move due to pain & its radiated to right arm   Patient complains warm sensation in her growing area

## 2017-11-06 NOTE — Patient Instructions (Addendum)
Follow up with referrals. Apply for discount program.  Cervical Radiculopathy Cervical radiculopathy means that a nerve in the neck is pinched or bruised. This can cause pain or loss of feeling (numbness) that runs from your neck to your arm and fingers. Follow these instructions at home: Managing pain  Take over-the-counter and prescription medicines only as told by your doctor.  If directed, put ice on the injured or painful area. ? Put ice in a plastic bag. ? Place a towel between your skin and the bag. ? Leave the ice on for 20 minutes, 2-3 times per day.  If ice does not help, you can try using heat. Take a warm shower or warm bath, or use a heat pack as told by your doctor.  You may try a gentle neck and shoulder massage. Activity  Rest as needed. Follow instructions from your doctor about any activities to avoid.  Do exercises as told by your doctor or physical therapist. General instructions  If you were given a soft collar, wear it as told by your doctor.  Use a flat pillow when you sleep.  Keep all follow-up visits as told by your doctor. This is important. Contact a doctor if:  Your condition does not improve with treatment. Get help right away if:  Your pain gets worse and is not controlled with medicine.  You lose feeling or feel weak in your hand, arm, face, or leg.  You have a fever.  You have a stiff neck.  You cannot control when you poop or pee (have incontinence).  You have trouble with walking, balance, or talking. This information is not intended to replace advice given to you by your health care provider. Make sure you discuss any questions you have with your health care provider. Document Released: 10/30/2011 Document Revised: 04/17/2016 Document Reviewed: 01/04/2015 Elsevier Interactive Patient Education  [employer].
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
## 2017-11-10 ENCOUNTER — Telehealth: Payer: Self-pay | Admitting: Family Medicine

## 2017-11-10 NOTE — Telephone Encounter (Signed)
Pt came in to request a letter to social services that she is unable to work because of pain. She would like the letter for eligibility for food stamps

## 2017-11-11 ENCOUNTER — Ambulatory Visit: Payer: Self-pay | Admitting: Surgery

## 2017-11-11 ENCOUNTER — Other Ambulatory Visit: Payer: Self-pay | Admitting: Family Medicine

## 2017-11-11 NOTE — Telephone Encounter (Signed)
Letter is ready for pick-up.

## 2017-11-13 ENCOUNTER — Emergency Department (HOSPITAL_COMMUNITY): Payer: Self-pay

## 2017-11-13 ENCOUNTER — Encounter (HOSPITAL_COMMUNITY): Payer: Self-pay | Admitting: Emergency Medicine

## 2017-11-13 ENCOUNTER — Emergency Department (HOSPITAL_COMMUNITY)
Admission: EM | Admit: 2017-11-13 | Discharge: 2017-11-13 | Disposition: A | Discharge status: Home / Self Care | Payer: Self-pay | Attending: Emergency Medicine | Admitting: Emergency Medicine

## 2017-11-13 ENCOUNTER — Other Ambulatory Visit: Payer: Self-pay

## 2017-11-13 DIAGNOSIS — R079 Chest pain, unspecified: Secondary | ICD-10-CM | POA: Insufficient documentation

## 2017-11-13 DIAGNOSIS — Z5321 Procedure and treatment not carried out due to patient leaving prior to being seen by health care provider: Secondary | ICD-10-CM | POA: Insufficient documentation

## 2017-11-13 LAB — BASIC METABOLIC PANEL
Anion gap: 6 (ref 5–15)
BUN: 13 mg/dL (ref 6–20)
CO2: 25 mmol/L (ref 22–32)
Calcium: 9.3 mg/dL (ref 8.9–10.3)
Chloride: 107 mmol/L (ref 101–111)
Creatinine, Ser: 0.6 mg/dL (ref 0.44–1.00)
GFR calc Af Amer: >60 mL/min (ref >60)
GFR calc non Af Amer: >60 mL/min (ref >60)
Glucose, Bld: 96 mg/dL (ref 65–99)
Potassium: 3.6 mmol/L (ref 3.5–5.1)
Sodium: 138 mmol/L (ref 135–145)

## 2017-11-13 LAB — CBC
HCT: 37.7 % (ref 36.0–46.0)
Hemoglobin: 13.5 g/dL (ref 12.0–15.0)
MCH: 31.7 pg (ref 26.0–34.0)
MCHC: 35.8 g/dL (ref 30.0–36.0)
MCV: 88.5 fL (ref 78.0–100.0)
Platelets: 243 10*3/uL (ref 150–400)
RBC: 4.26 MIL/uL (ref 3.87–5.11)
RDW: 12.4 % (ref 11.5–15.5)
WBC: 6.4 10*3/uL (ref 4.0–10.5)

## 2017-11-13 LAB — I-STAT BETA HCG BLOOD, ED (MC, WL, AP ONLY): I-stat hCG, quantitative: <5 m[IU]/mL (ref <5)

## 2017-11-13 LAB — I-STAT TROPONIN, ED: Troponin i, poc: 0.02 ng/mL (ref 0.00–0.08)

## 2017-11-13 NOTE — ED Notes (Signed)
Patient left because she did not get pain medication. Patient was not vomiting or complaining of nausea when nurse was triaging. Patient was put in room 7 so that she could be seen. Patient would not sign ama paper work and just walked out. Patient asked for patient experience number and names of people that was taking care of her. Nurse wrote it down on paper for patient.

## 2017-11-13 NOTE — ED Triage Notes (Signed)
Patient complaining of chest pain, right arm numbness, nausea, and vomiting. Patient states it started a week ago. It got worse tonight and she could not take it.

## 2017-11-13 NOTE — ED Notes (Signed)
Bed: WTR7 Expected date:  Expected time:  Means of arrival:  Comments: 

## 2017-11-19 ENCOUNTER — Ambulatory Visit: Payer: Self-pay | Admitting: Family Medicine

## 2017-12-07 ENCOUNTER — Other Ambulatory Visit: Payer: Self-pay

## 2017-12-09 ENCOUNTER — Ambulatory Visit: Payer: Self-pay | Admitting: General Surgery

## 2017-12-11 ENCOUNTER — Ambulatory Visit: Payer: Self-pay | Admitting: General Surgery

## 2017-12-14 ENCOUNTER — Ambulatory Visit: Payer: Self-pay | Admitting: Surgery

## 2017-12-14 ENCOUNTER — Telehealth: Payer: Self-pay | Admitting: General Practice

## 2017-12-14 NOTE — Telephone Encounter (Signed)
Left a message for the patient to call the office, patient no showed appointment with Dr. Rosana Hoes. Please r/s if the patient calls back.

## 2017-12-15 ENCOUNTER — Encounter: Payer: Self-pay | Admitting: General Practice

## 2017-12-15 NOTE — Telephone Encounter (Signed)
Left another message for the patient to call the office, also mailed a letter to the patient to contact our office.

## 2017-12-25 ENCOUNTER — Ambulatory Visit: Payer: Self-pay | Admitting: Surgery

## 2017-12-29 ENCOUNTER — Encounter: Payer: Self-pay | Admitting: General Practice

## 2017-12-29 ENCOUNTER — Telehealth: Payer: Self-pay | Admitting: General Practice

## 2017-12-29 NOTE — Telephone Encounter (Signed)
Left a message for the patient to call the office patient no showed appointment on 12/25/17. I have also mailed a letter to the patient to call the office. Please r/s if the patient calls back.

## 2018-01-01 ENCOUNTER — Ambulatory Visit: Payer: Self-pay | Attending: Family Medicine | Admitting: Family Medicine

## 2018-01-01 ENCOUNTER — Encounter: Payer: Self-pay | Admitting: Family Medicine

## 2018-01-01 VITALS — BP 161/79 | HR 83 | Temp 98.0°F | Ht 70.0 in | Wt 151.0 lb

## 2018-01-01 DIAGNOSIS — F410 Panic disorder [episodic paroxysmal anxiety] without agoraphobia: Secondary | ICD-10-CM | POA: Insufficient documentation

## 2018-01-01 DIAGNOSIS — F4310 Post-traumatic stress disorder, unspecified: Secondary | ICD-10-CM | POA: Insufficient documentation

## 2018-01-01 DIAGNOSIS — M5416 Radiculopathy, lumbar region: Secondary | ICD-10-CM | POA: Insufficient documentation

## 2018-01-01 DIAGNOSIS — F329 Major depressive disorder, single episode, unspecified: Secondary | ICD-10-CM | POA: Insufficient documentation

## 2018-01-01 DIAGNOSIS — I498 Other specified cardiac arrhythmias: Secondary | ICD-10-CM

## 2018-01-01 DIAGNOSIS — R2231 Localized swelling, mass and lump, right upper limb: Secondary | ICD-10-CM

## 2018-01-01 DIAGNOSIS — R262 Difficulty in walking, not elsewhere classified: Secondary | ICD-10-CM | POA: Insufficient documentation

## 2018-01-01 DIAGNOSIS — E559 Vitamin D deficiency, unspecified: Secondary | ICD-10-CM | POA: Insufficient documentation

## 2018-01-01 DIAGNOSIS — R222 Localized swelling, mass and lump, trunk: Secondary | ICD-10-CM | POA: Insufficient documentation

## 2018-01-01 DIAGNOSIS — R000 Tachycardia, unspecified: Secondary | ICD-10-CM

## 2018-01-01 DIAGNOSIS — K219 Gastro-esophageal reflux disease without esophagitis: Secondary | ICD-10-CM | POA: Insufficient documentation

## 2018-01-01 DIAGNOSIS — M797 Fibromyalgia: Secondary | ICD-10-CM | POA: Insufficient documentation

## 2018-01-01 DIAGNOSIS — Z79899 Other long term (current) drug therapy: Secondary | ICD-10-CM | POA: Insufficient documentation

## 2018-01-01 DIAGNOSIS — R3915 Urgency of urination: Secondary | ICD-10-CM | POA: Insufficient documentation

## 2018-01-01 DIAGNOSIS — R569 Unspecified convulsions: Secondary | ICD-10-CM | POA: Insufficient documentation

## 2018-01-01 DIAGNOSIS — I951 Orthostatic hypotension: Secondary | ICD-10-CM

## 2018-01-01 DIAGNOSIS — G90A Postural orthostatic tachycardia syndrome (POTS): Secondary | ICD-10-CM

## 2018-01-01 DIAGNOSIS — M5412 Radiculopathy, cervical region: Secondary | ICD-10-CM | POA: Insufficient documentation

## 2018-01-01 DIAGNOSIS — G43009 Migraine without aura, not intractable, without status migrainosus: Secondary | ICD-10-CM | POA: Insufficient documentation

## 2018-01-01 DIAGNOSIS — I10 Essential (primary) hypertension: Secondary | ICD-10-CM | POA: Insufficient documentation

## 2018-01-01 DIAGNOSIS — R350 Frequency of micturition: Secondary | ICD-10-CM | POA: Insufficient documentation

## 2018-01-01 LAB — POCT URINALYSIS DIPSTICK
Bilirubin, UA: NEGATIVE
Blood, UA: NEGATIVE
Glucose, UA: NEGATIVE
Ketones, UA: NEGATIVE
Leukocytes, UA: NEGATIVE
Nitrite, UA: NEGATIVE
Protein, UA: NEGATIVE
Spec Grav, UA: 1.015 (ref 1.010–1.025)
Urobilinogen, UA: 0.2 E.U./dL
pH, UA: 7.5 (ref 5.0–8.0)

## 2018-01-01 MED ORDER — METOPROLOL SUCCINATE ER 100 MG PO TB24: 100.0000 mg | ORAL_TABLET | Freq: Every day | ORAL | 3 refills | Status: DC

## 2018-01-01 MED ORDER — METHOCARBAMOL 500 MG PO TABS: 500.0000 mg | ORAL_TABLET | Freq: Three times a day (TID) | ORAL | 2 refills | Status: DC | PRN

## 2018-01-01 MED ORDER — GABAPENTIN 300 MG PO CAPS: 300.0000 mg | ORAL_CAPSULE | Freq: Every day | ORAL | 3 refills | Status: DC

## 2018-01-01 NOTE — Progress Notes (Signed)
Subjective:  Patient ID: [name], female    DOB: 1986/08/19  Age: 32 y.o. MRN: [id number]  CC: Hospitalization Follow-up   HPI [name]  is a 32 year old right-handed female with a history of hypertension, ,POTS,  PTSD, anxiety and depression who presents today for follow-up visit.  Today she complains of urinary frequency, urgency continence and has had to urinate up to 6 times a day; UA dipstick in the clinic is negative for UTI.; She also complains of numbness in her right arm inability to sit or stand for long periods of time, difficulty ambulating which led to a fall last week.  3 weeks ago she was seen at the [facility] emergency department where she had presented with difficulty moving her lower extremities - notes reviewed. MRI cervical, thoracic, lumbar spine without contrast revealed reversal of cervical lordosis, cord was normal in pathology, normal disc heights and intervertebral disc no canal or foraminal narrowing, central disc protrusion at C5-C6 abutting and slightly deforming the ventral aspect of the cord. She was seen by the neurology fellow and exam revealed no definitive neurologic findings with many inconsistencies and there was a concern for functional symptoms and no further neurologic workup recommended.  She was previously referred to neurosurgery but was unable to make the co-pay due to lack of medical coverage.  She also has a general surgery referral pending due to a right axillary mass and is needing to reschedule that.  For her POTS she was previously followed by cardiology but was lost to follow-up when she could no longer afford her coverage and has been out of her metoprolol hence her elevated blood pressure. She does have a questionable history of seizures and migraines and was previously followed by neurology whom she states she can also not see due to lack of medical coverage. She is unhappy about her chronic medical issues  which preclude her from working as she states she would like to be able to work so she can obtain medical coverage to have a thorough evaluation of her ongoing symptoms. Overall she appears to be more pleasant today and does not seem to be in much pain as compared to her previous visits.  Past Medical History:  Diagnosis Date  . Allergic rhinitis 01/24/2015  . Arrhythmia Dx 2015  . Back pain   . Breast mass   . Common migraine 04/15/2017  . Depression Dx 2000  . Fibromyalgia   . GERD (gastroesophageal reflux disease) 03/13/2015  . Headache    migraines  . Heart murmur Dx 2015  . 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 [address]  . Lumbar radiculopathy   . Pain disorder 08/29/2017  . Panic attack Dx 2006  . POTS (postural orthostatic tachycardia syndrome) 03/24/2014  . PTSD (post-traumatic stress disorder) Dx 2013  . Seizures ([place]) 2001  . Vitamin D deficiency 01/24/2015    Past Surgical History:  Procedure Laterality Date  . ESOPHAGOGASTRODUODENOSCOPY N/A 08/14/2015   Procedure: ESOPHAGOGASTRODUODENOSCOPY (EGD);  Surgeon: Carol Ada, MD;  Location: [facility];  Service: Endoscopy;  Laterality: N/A;    Allergies  Allergen Reactions  . Aspirin Hives     Outpatient Medications Prior to Visit  Medication Sig Dispense Refill  . gabapentin (NEURONTIN) 300 MG capsule Take 1 capsule (300 mg total) by mouth at bedtime. 30 capsule 0  . methocarbamol (ROBAXIN) 500 MG tablet Take 1 tablet (500 mg total) by mouth every 8 (eight) hours as  needed. 40 tablet 0  . metoprolol succinate (TOPROL-XL) 100 MG 24 hr tablet Take 1 tablet by mouth 1 day or 1 dose.    Marland Kitchen acetaminophen (TYLENOL) 500 MG tablet Take 2 tablets (1,000 mg total) by mouth every 8 (eight) hours as needed. (Patient not taking: Reported on 01/01/2018) 40 tablet 0  . pregabalin (LYRICA) 100 MG capsule Take 1 capsule by mouth 1 day or 1 dose.     No facility-administered medications prior to visit.      ROS Review of Systems  Constitutional: Negative for activity change, appetite change and fatigue.  HENT: Negative for congestion, sinus pressure and sore throat.   Eyes: Negative for visual disturbance.  Respiratory: Negative for cough, chest tightness, shortness of breath and wheezing.   Cardiovascular: Negative for chest pain and palpitations.  Gastrointestinal: Negative for abdominal distention, abdominal pain and constipation.  Endocrine: Negative for polydipsia.  Genitourinary: Positive for frequency. Negative for dysuria.  Musculoskeletal:       See hpi  Skin: Negative for rash.  Neurological: Positive for numbness. Negative for tremors and light-headedness.  Hematological: Does not bruise/bleed easily.  Psychiatric/Behavioral: Negative for agitation and behavioral problems.    Objective:  BP (!) 161/79   Pulse 83   Temp 98 F (36.7 C) (Oral)   Ht 5\' 10"  (1.778 m)   Wt 151 lb (68.5 kg)   LMP 12/14/2017   SpO2 100%   BMI 21.67 kg/m   BP/Weight 01/01/2018 11/13/2017 61/60/7371  Systolic BP 062 694 854  Diastolic BP 79 83 78  Wt. (Lbs) 151 152 152.4  BMI 21.67 21.81 21.87      Physical Exam  Constitutional: She is oriented to person, place, and time. She appears well-developed and well-nourished.  Cardiovascular: Normal rate, normal heart sounds and intact distal pulses.  No murmur heard. Pulmonary/Chest: Effort normal and breath sounds normal. She has no wheezes. She has no rales. She exhibits no tenderness.  Abdominal: Soft. Bowel sounds are normal. She exhibits no distension and no mass. There is tenderness (slight lower abdominal TTP).  Musculoskeletal:  Tenderness on palpation of all the trigger points  Neurological: She is alert and oriented to person, place, and time.  She ambulates with a limp and by dragging her right lower extremity  Skin: Skin is warm and dry.  Psychiatric: She has a normal mood and affect.     Assessment & Plan:   1. Axillary  mass, right She missed her appointment with general surgery and will need to reschedule  2. POTS (postural orthostatic tachycardia syndrome) I have refilled her Toprol which she was previously taking Lost to cardiology follow-up due to lack of medical coverage I will refer again - Ambulatory referral to Cardiology  3. Migraine without aura and without status migrainosus, not intractable Stable Not currently on medications Unable to see her neurologist due to lack of medical coverage; referred again as per patient request - Ambulatory referral to Neurology  4. Lumbar radicular pain Uncontrolled with ambulation difficulties Unable to see neurosurgery due to lack of medical coverage and inability to afford the co-pay requested Her chronic symptoms seem to point more towards chronic pain and fibromyalgia - gabapentin (NEURONTIN) 300 MG capsule; Take 1 capsule (300 mg total) by mouth at bedtime.  Dispense: 30 capsule; Refill: 3 - methocarbamol (ROBAXIN) 500 MG tablet; Take 1 tablet (500 mg total) by mouth every 8 (eight) hours as needed.  Dispense: 60 tablet; Refill: 2 - Ambulatory referral to Physical Medicine Rehab -  Ambulatory referral to Spine Surgery - POCT urinalysis dipstick  5. Cervical radiculopathy - gabapentin (NEURONTIN) 300 MG capsule; Take 1 capsule (300 mg total) by mouth at bedtime.  Dispense: 30 capsule; Refill: 3 - Ambulatory referral to Spine Surgery   Meds ordered this encounter  Medications  . metoprolol succinate (TOPROL-XL) 100 MG 24 hr tablet    Sig: Take 1 tablet (100 mg total) by mouth daily.    Dispense:  30 tablet    Refill:  3  . gabapentin (NEURONTIN) 300 MG capsule    Sig: Take 1 capsule (300 mg total) by mouth at bedtime.    Dispense:  30 capsule    Refill:  3  . methocarbamol (ROBAXIN) 500 MG tablet    Sig: Take 1 tablet (500 mg total) by mouth every 8 (eight) hours as needed.    Dispense:  60 tablet    Refill:  2    Follow-up: Return in about  3 months (around 03/31/2018) for follow up of chronic medical conditions.   [name] MD

## 2018-01-01 NOTE — Patient Instructions (Signed)

## 2018-01-05 ENCOUNTER — Telehealth: Payer: Self-pay | Admitting: Family Medicine

## 2018-01-05 ENCOUNTER — Encounter: Payer: Self-pay | Admitting: Neurology

## 2018-01-05 NOTE — Telephone Encounter (Signed)
[facility] called to say that the do not accept the OC, and if this pt has the financial letter to let them know bc it is not in the system. Please follow up

## 2018-01-07 NOTE — Telephone Encounter (Signed)
02/03/18 @ 8:45AM  Patient has an appointment with [employer] . When patient apply with Financial at [facility] they don't scan the letter and the specialist can't see that

## 2018-01-15 ENCOUNTER — Ambulatory Visit (HOSPITAL_COMMUNITY)
Admission: EM | Admit: 2018-01-15 | Discharge: 2018-01-15 | Disposition: A | Discharge status: Home / Self Care | Payer: Self-pay

## 2018-01-15 ENCOUNTER — Emergency Department (HOSPITAL_COMMUNITY)
Admission: EM | Admit: 2018-01-15 | Discharge: 2018-01-15 | Disposition: A | Discharge status: Home / Self Care | Payer: Self-pay | Attending: Emergency Medicine | Admitting: Emergency Medicine

## 2018-01-15 ENCOUNTER — Other Ambulatory Visit: Payer: Self-pay

## 2018-01-15 ENCOUNTER — Encounter (HOSPITAL_COMMUNITY): Payer: Self-pay | Admitting: Emergency Medicine

## 2018-01-15 ENCOUNTER — Emergency Department (HOSPITAL_COMMUNITY): Payer: Self-pay

## 2018-01-15 ENCOUNTER — Encounter (HOSPITAL_COMMUNITY): Payer: Self-pay | Admitting: *Deleted

## 2018-01-15 DIAGNOSIS — R1084 Generalized abdominal pain: Secondary | ICD-10-CM

## 2018-01-15 DIAGNOSIS — Z3202 Encounter for pregnancy test, result negative: Secondary | ICD-10-CM

## 2018-01-15 DIAGNOSIS — I10 Essential (primary) hypertension: Secondary | ICD-10-CM | POA: Insufficient documentation

## 2018-01-15 DIAGNOSIS — Z87891 Personal history of nicotine dependence: Secondary | ICD-10-CM | POA: Insufficient documentation

## 2018-01-15 DIAGNOSIS — Z79899 Other long term (current) drug therapy: Secondary | ICD-10-CM | POA: Insufficient documentation

## 2018-01-15 LAB — COMPREHENSIVE METABOLIC PANEL
ALT: 11 U/L — ABNORMAL LOW (ref 14–54)
AST: 20 U/L (ref 15–41)
Albumin: 4.6 g/dL (ref 3.5–5.0)
Alkaline Phosphatase: 60 U/L (ref 38–126)
Anion gap: 13 (ref 5–15)
BUN: 11 mg/dL (ref 6–20)
CO2: 23 mmol/L (ref 22–32)
Calcium: 9.4 mg/dL (ref 8.9–10.3)
Chloride: 103 mmol/L (ref 101–111)
Creatinine, Ser: 0.65 mg/dL (ref 0.44–1.00)
GFR calc Af Amer: >60 mL/min (ref >60)
GFR calc non Af Amer: >60 mL/min (ref >60)
Glucose, Bld: 121 mg/dL — ABNORMAL HIGH (ref 65–99)
Potassium: 3.3 mmol/L — ABNORMAL LOW (ref 3.5–5.1)
Sodium: 139 mmol/L (ref 135–145)
Total Bilirubin: 1.4 mg/dL — ABNORMAL HIGH (ref 0.3–1.2)
Total Protein: 7.3 g/dL (ref 6.5–8.1)

## 2018-01-15 LAB — POCT URINALYSIS DIP (DEVICE)
Bilirubin Urine: NEGATIVE
Glucose, UA: NEGATIVE mg/dL
Ketones, ur: 15 mg/dL — AB
Leukocytes, UA: NEGATIVE
Nitrite: NEGATIVE
Protein, ur: NEGATIVE mg/dL
Specific Gravity, Urine: 1.02 (ref 1.005–1.030)
Urobilinogen, UA: 0.2 mg/dL (ref 0.0–1.0)
pH: 6 (ref 5.0–8.0)

## 2018-01-15 LAB — URINALYSIS, ROUTINE W REFLEX MICROSCOPIC
Bilirubin Urine: NEGATIVE
Glucose, UA: NEGATIVE mg/dL
Ketones, ur: 20 mg/dL — AB
Leukocytes, UA: NEGATIVE
Nitrite: NEGATIVE
Protein, ur: NEGATIVE mg/dL
Specific Gravity, Urine: 1.015 (ref 1.005–1.030)
pH: 7 (ref 5.0–8.0)

## 2018-01-15 LAB — LIPASE, BLOOD: Lipase: 21 U/L (ref 11–51)

## 2018-01-15 LAB — CBC
HCT: 39.3 % (ref 36.0–46.0)
Hemoglobin: 14.2 g/dL (ref 12.0–15.0)
MCH: 31.8 pg (ref 26.0–34.0)
MCHC: 36.1 g/dL — ABNORMAL HIGH (ref 30.0–36.0)
MCV: 87.9 fL (ref 78.0–100.0)
Platelets: 344 10*3/uL (ref 150–400)
RBC: 4.47 MIL/uL (ref 3.87–5.11)
RDW: 12.2 % (ref 11.5–15.5)
WBC: 8.7 10*3/uL (ref 4.0–10.5)

## 2018-01-15 LAB — I-STAT BETA HCG BLOOD, ED (MC, WL, AP ONLY): I-stat hCG, quantitative: <5 m[IU]/mL (ref <5)

## 2018-01-15 LAB — POCT PREGNANCY, URINE: Preg Test, Ur: NEGATIVE

## 2018-01-15 MED ORDER — ONDANSETRON HCL 4 MG PO TABS: 4.0000 mg | ORAL_TABLET | Freq: Four times a day (QID) | ORAL | 0 refills | Status: DC

## 2018-01-15 MED ORDER — ONDANSETRON HCL 4 MG/2ML IJ SOLN
4.0000 mg | Freq: Once | INTRAMUSCULAR | Status: AC
  Administered 2018-01-15: 4 mg via INTRAVENOUS
  Filled 2018-01-15: qty 2

## 2018-01-15 MED ORDER — MORPHINE SULFATE (PF) 4 MG/ML IV SOLN: 4.0000 mg | Freq: Once | INTRAVENOUS | Status: DC

## 2018-01-15 MED ORDER — SODIUM CHLORIDE 0.9 % IV BOLUS (SEPSIS)
1000.0000 mL | Freq: Once | INTRAVENOUS | Status: AC
  Administered 2018-01-15: 1000 mL via INTRAVENOUS

## 2018-01-15 MED ORDER — IOPAMIDOL (ISOVUE-300) INJECTION 61%
INTRAVENOUS | Status: AC
  Administered 2018-01-15: 100 mL
  Filled 2018-01-15: qty 100

## 2018-01-15 MED ORDER — OXYCODONE-ACETAMINOPHEN 5-325 MG PO TABS
2.0000 | ORAL_TABLET | Freq: Once | ORAL | Status: AC
  Administered 2018-01-15: 2 via ORAL
  Filled 2018-01-15: qty 2

## 2018-01-15 MED ORDER — DICYCLOMINE HCL 20 MG PO TABS: 20.0000 mg | ORAL_TABLET | Freq: Two times a day (BID) | ORAL | 0 refills | Status: DC | PRN

## 2018-01-15 MED ORDER — MORPHINE SULFATE (PF) 4 MG/ML IV SOLN
4.0000 mg | Freq: Once | INTRAVENOUS | Status: AC
  Administered 2018-01-15: 4 mg via INTRAVENOUS
  Filled 2018-01-15: qty 1

## 2018-01-15 NOTE — Discharge Instructions (Signed)
You were evaluated in the emergency department for abdominal pain.  Your testing including lab work and a CT did not show an obvious answer for your symptoms.  We are prescribing some nausea medicine and some medicine to help with the spasms.  You should continue a clear liquid diet until the pain is improved and then advance diet slowly.  Please follow-up with your primary care doctor and return to the emergency department if any worsening symptoms.

## 2018-01-15 NOTE — ED Triage Notes (Signed)
Pt in c/o abdominal pain that starts under her rib cage and radiates down to her legs, pain is worse in suprapubic area, no history of same, pain x3 days, reports n/v, denies diarrhea, LBM 4 days ago which is not normal for her, pt tearful in triage

## 2018-01-15 NOTE — ED Triage Notes (Signed)
Pt c/o upper generalized abdominal pain that travels all the way down to her knees. Pt states the worst pain is in her pelvic bladder area. Pt appears very uncomfortable. Pain x3 days. Has been vomiting.

## 2018-01-15 NOTE — ED Provider Notes (Signed)
[facility] EMERGENCY DEPARTMENT Provider Note   CSN: [id number] Arrival date & time: 01/15/18  1041     History   Chief Complaint Chief Complaint  Patient presents with  . Abdominal Pain    HPI [name] is a 32 y.o. female.  Patient complaining of severe abdominal pain subxiphoid all the way to the pelvis sharp stabbing 10 out of 10 since Sunday.  Associated with fevers and chills although temperature not taken.  Also associated with nausea vomiting and unable to keep anything down p.o.  She also states she has had her period since Sunday that often will cause pelvic pain but never this kind of pain.  It is heavier than normal and she is having clots.  She also complaining of dysuria and complaining of cough productive of yellow phlegm.  She went to urgent care today and they told her she needed to come here for further evaluation.  The history is provided by the patient.  Abdominal Pain   This is a new problem. The current episode started more than 2 days ago. The problem occurs constantly. The problem has not changed since onset.The pain is located in the generalized abdominal region. The quality of the pain is sharp. The pain is severe. Associated symptoms include fever, nausea, vomiting, dysuria and frequency. Pertinent negatives include diarrhea, hematochezia, melena and arthralgias. Nothing aggravates the symptoms. Nothing relieves the symptoms.    Past Medical History:  Diagnosis Date  . Allergic rhinitis 01/24/2015  . Arrhythmia Dx 2015  . Back pain   . Breast mass   . Common migraine 04/15/2017  . Depression Dx 2000  . Fibromyalgia   . GERD (gastroesophageal reflux disease) 03/13/2015  . Headache    migraines  . Heart murmur Dx 2015  . 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 [place]pert[place] Dx 250[address]  . Lumbar radiculopathy   . Pain disorder 08/29/2017  . Panic attack Dx 2006  . POTS (postural orthostatic tachycardia syndrome) 03/24/2014    . PTSD (post-traumatic stress disorder) Dx 2013  . Seizures ([place]) 2001  . Vitamin D deficiency 01/24/2015    Patient Active Problem List   Diagnosis Date Noted  . Cervical radiculopathy 01/01/2018  . Pain disorder 08/29/2017  . Positive depression screening 08/29/2017  . Neck pain without injury 08/24/2017  . Chronic fatigue 05/15/2017  . Common migraine 04/15/2017  . Axillary mass, right 04/09/2017  . History of bulimia nervosa 03/13/2015  . GERD (gastroesophageal reflux disease) 03/13/2015  . Pap smear for cervical cancer screening 03/13/2015  . SOB (shortness of breath) 03/13/2015  . H/O vitamin D deficiency 03/13/2015  . Health care maintenance 01/25/2015  . Chronic tension type headache 01/25/2015  . Muscle pain, fibromyalgia 01/24/2015  . Allergic rhinitis 01/24/2015  . Lumbar radicular pain 11/22/2014  . Thoracic neuralgia 11/22/2014  . Back pain   . Hemoglobin AC 07/03/2014  . POTS (postural orthostatic tachycardia syndrome) 03/24/2014  . Abdominal pain 06/06/2012  . Chronic pain 06/06/2012  . Depression 06/06/2012  . HEMOPTYSIS UNSPECIFIED 12/14/2009  . BREAST TENDERNESS 11/08/2009  . NIPPLE DISCHARGE 11/08/2009  . MENORRHAGIA 11/08/2009  . DISORDER, BIPOLAR NOS 08/06/2007  . PERSONALITY DISORDER 08/06/2007  . MARIJUANA ABUSE 08/06/2007  . NARCOTIC ABUSE 08/06/2007  . SEIZURE DISORDER 08/06/2007  . 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 ESOPHAGOGASTRODUODENOSCOPY N/A 08/14/2015   Procedure: ESOPHAGOGASTRODUODENOSCOPY (EGD);  Surgeon: Carol Ada, MD;  Location: [facility];  Service: Endoscopy;  Laterality: N/A;    OB History    Gravida Para Term Preterm AB Living   1       1     SAB TAB Ectopic Multiple Live Births   1       0       Home Medications    Prior to Admission medications   Medication Sig Start Date End Date Taking? Authorizing Provider  acetaminophen (TYLENOL) 500 MG tablet Take 2  tablets (1,000 mg total) by mouth every 8 (eight) hours as needed. Patient not taking: Reported on 01/01/2018 11/06/17   Alfonse Spruce, FNP  gabapentin (NEURONTIN) 300 MG capsule Take 1 capsule (300 mg total) by mouth at bedtime. 01/01/18   [name], MD  methocarbamol (ROBAXIN) 500 MG tablet Take 1 tablet (500 mg total) by mouth every 8 (eight) hours as needed. 01/01/18   [name], MD  metoprolol succinate (TOPROL-XL) 100 MG 24 hr tablet Take 1 tablet (100 mg total) by mouth daily. 01/01/18   [name], MD  NAPROXEN PO Take by mouth.    [provider]  pregabalin (LYRICA) 100 MG capsule Take 1 capsule by mouth 1 day or 1 dose.    [provider]    Family History Family History  Problem Relation Age of Onset  . Heart attack Mother   . Heart disease Mother   . Sudden death Mother   . Sudden death Sister   . Heart attack Sister   . Sudden death Maternal Grandmother   . Fainting Maternal Grandmother   . Heart attack Maternal Grandmother   . Breast cancer Paternal Grandmother   . Breast cancer Cousin     Social History Social History   Tobacco Use  . Smoking status: Former Smoker    Last attempt to quit: 06/23/2012    Years since quitting: 5.5  . Smokeless tobacco: Never Used  Substance Use Topics  . Alcohol use: Yes    Comment: socially  . Drug use: No     Allergies   Aspirin   Review of Systems Review of Systems  Constitutional: Positive for chills and fever.  HENT: Negative for ear pain and sore throat.   Eyes: Negative for pain and visual disturbance.  Respiratory: Positive for cough. Negative for shortness of breath.   Cardiovascular: Negative for chest pain and palpitations.  Gastrointestinal: Positive for abdominal pain, nausea and vomiting. Negative for diarrhea, hematochezia and melena.  Genitourinary: Positive for dysuria, frequency and vaginal bleeding.  Musculoskeletal: Negative for arthralgias and back pain.  Skin:  Negative for color change and rash.  Neurological: Negative for seizures and syncope.  All other systems reviewed and are negative.    Physical Exam Updated Vital Signs BP (!) 130/91   Pulse (!) 105   Temp 98.5 F (36.9 C) (Oral)   Resp 20   LMP 01/15/2018   SpO2 100%   Physical Exam  Constitutional: She appears well-developed and well-nourished. No distress.  HENT:  Head: Normocephalic and atraumatic.  Eyes: Conjunctivae are normal.  Neck: Neck supple.  Cardiovascular: Regular rhythm and normal pulses. Tachycardia present.  No murmur heard. Pulmonary/Chest: Effort normal and breath sounds normal. No respiratory distress.  Abdominal: Soft. Normal appearance. She exhibits no mass. There is generalized tenderness. There is no rigidity and no guarding.  Musculoskeletal: Normal range of motion. She exhibits no edema, tenderness or deformity.  Neurological: She is alert.  Skin: Skin is warm and dry.  Psychiatric: She has a normal mood and affect.  Nursing note and vitals reviewed.    ED Treatments / Results  Labs (all labs ordered are listed, but only abnormal results are displayed) Labs Reviewed  COMPREHENSIVE METABOLIC PANEL - Abnormal; Notable for the following components:      Result Value   Potassium 3.3 (*)    Glucose, Bld 121 (*)    ALT 11 (*)    Total Bilirubin 1.4 (*)    All other components within normal limits  CBC - Abnormal; Notable for the following components:   MCHC 36.1 (*)    All other components within normal limits  URINALYSIS, ROUTINE W REFLEX MICROSCOPIC - Abnormal; Notable for the following components:   Hgb urine dipstick MODERATE (*)    Ketones, ur 20 (*)    Bacteria, UA RARE (*)    Squamous Epithelial / LPF 0-5 (*)    All other components within normal limits  LIPASE, BLOOD  I-STAT BETA HCG BLOOD, ED ([facility], [facility], [facility] ONLY)    EKG  EKG Interpretation None       Radiology Ct Abdomen Pelvis W Contrast  Result Date: 01/15/2018 CLINICAL  DATA:  Right-sided abdominal pain and nausea and vomiting for 3 days. EXAM: CT ABDOMEN AND PELVIS WITH CONTRAST TECHNIQUE: Multidetector CT imaging of the abdomen and pelvis was performed using the standard protocol following bolus administration of intravenous contrast. CONTRAST:  164mL ISOVUE-300 IOPAMIDOL (ISOVUE-300) INJECTION 61% COMPARISON:  04/07/2017 FINDINGS: Lower Chest: No acute findings. Hepatobiliary: No hepatic masses identified. Gallbladder is unremarkable. Pancreas:  No mass or inflammatory changes. Spleen: Within normal limits in size and appearance. Adrenals/Urinary Tract: No masses identified. No evidence of hydronephrosis. Stomach/Bowel: No evidence of obstruction, inflammatory process or abnormal fluid collections. Although the appendix is not directly visualized, no inflammatory process seen in region of the cecum or elsewhere. Vascular/Lymphatic: No pathologically enlarged lymph nodes. No abdominal aortic aneurysm. Reproductive:  No mass or other significant abnormality. Other:  None. Musculoskeletal:  No suspicious bone lesions identified. IMPRESSION: Negative.  No acute findings or other significant abnormality. Electronically Signed   By: Earle Gell M.D.   On: 01/15/2018 14:43    Procedures Procedures (including critical care time)  Medications Ordered in ED Medications  sodium chloride 0.9 % bolus 1,000 mL (not administered)  morphine 4 MG/ML injection 4 mg (not administered)  ondansetron (ZOFRAN) injection 4 mg (not administered)     Initial Impression / Assessment and Plan / ED Course  I have reviewed the triage vital signs and the nursing notes.  Pertinent labs & imaging results that were available during my care of the patient were reviewed by me and considered in my medical decision making (see chart for details).  Clinical Course as of Jan 16 1011  Fri Jan 15, 2018  1508 Patient's lab work and CT imaging were unremarkable.  She is asking for some form pain  medicine so I ordered her some oxycodone.  Urine is still pending but she looks more comfortable so I think she will probably ultimately be discharged to follow-up with her primary care doctor.  [MB]  2426 Urine with too numerous to count reds but patient still finishing her period and CT with no evidence stone.  [MB]  1602 Patient tolerating p.o. without difficulty.  [MB]    Clinical Course User Index [MB] Hayden Rasmussen, MD      Final Clinical Impressions(s) / ED Diagnoses   Final diagnoses:  Generalized abdominal pain  ED Discharge Orders        Ordered    ondansetron (ZOFRAN) 4 MG tablet  Every 6 hours     01/15/18 1713    dicyclomine (BENTYL) 20 MG tablet  2 times daily PRN     01/15/18 1713       Hayden Rasmussen, MD 01/16/18 1015

## 2018-01-15 NOTE — ED Notes (Signed)
Patient transported to CT 

## 2018-01-15 NOTE — ED Notes (Signed)
EDP advises to hold next dose of morphine r/t negative CT scan

## 2018-01-15 NOTE — ED Notes (Signed)
ED Provider at bedside. 

## 2018-01-18 ENCOUNTER — Ambulatory Visit: Payer: Self-pay | Attending: Family Medicine | Admitting: Family Medicine

## 2018-01-18 ENCOUNTER — Ambulatory Visit: Payer: Self-pay | Attending: Family Medicine | Admitting: Licensed Clinical Social Worker

## 2018-01-18 ENCOUNTER — Encounter: Payer: Self-pay | Admitting: Family Medicine

## 2018-01-18 VITALS — BP 113/74 | HR 97 | Temp 98.5°F | Ht 70.0 in | Wt 152.6 lb

## 2018-01-18 DIAGNOSIS — N920 Excessive and frequent menstruation with regular cycle: Secondary | ICD-10-CM | POA: Insufficient documentation

## 2018-01-18 DIAGNOSIS — R1033 Periumbilical pain: Secondary | ICD-10-CM | POA: Insufficient documentation

## 2018-01-18 DIAGNOSIS — F4310 Post-traumatic stress disorder, unspecified: Secondary | ICD-10-CM | POA: Insufficient documentation

## 2018-01-18 DIAGNOSIS — D259 Leiomyoma of uterus, unspecified: Secondary | ICD-10-CM | POA: Insufficient documentation

## 2018-01-18 DIAGNOSIS — F4321 Adjustment disorder with depressed mood: Secondary | ICD-10-CM | POA: Insufficient documentation

## 2018-01-18 DIAGNOSIS — F321 Major depressive disorder, single episode, moderate: Secondary | ICD-10-CM

## 2018-01-18 DIAGNOSIS — D251 Intramural leiomyoma of uterus: Secondary | ICD-10-CM | POA: Insufficient documentation

## 2018-01-18 DIAGNOSIS — Z79899 Other long term (current) drug therapy: Secondary | ICD-10-CM | POA: Insufficient documentation

## 2018-01-18 DIAGNOSIS — I10 Essential (primary) hypertension: Secondary | ICD-10-CM | POA: Insufficient documentation

## 2018-01-18 MED ORDER — PROMETHAZINE HCL 25 MG RE SUPP: 25.0000 mg | Freq: Three times a day (TID) | RECTAL | 0 refills | Status: DC | PRN

## 2018-01-18 MED ORDER — KETOROLAC TROMETHAMINE 60 MG/2ML IM SOLN
60.0000 mg | Freq: Once | INTRAMUSCULAR | Status: AC
  Administered 2018-01-18: 60 mg via INTRAMUSCULAR

## 2018-01-18 NOTE — BH Specialist Note (Signed)
Integrated Behavioral Health Initial Visit  MRN: [id number] Name: [name]  Number of [facility] Clinician visits:: 1/6 Session Start time: 11:20 AM  Session End time: 12:00 PM Total time: 40 minutes  Type of Service: [place] Interpretor:No. Interpretor Name and Language: N/A   Warm Hand Off Completed.       SUBJECTIVE: [name] is a 32 y.o. female accompanied by self Patient was referred by Dr. Margarita Rana for depression and anxiety. Patient reports the following symptoms/concerns: overwhelming feelings of sadness, worry, grief, difficulty sleeping, low motivation, decreased concentration, and irritability Duration of problem: "A while ago...years"; Severity of problem: moderate  OBJECTIVE: Mood: Dysphoric and Affect: Tearful Risk of harm to self or others: No plan to harm self or others  LIFE CONTEXT: Family and Social: Pt resides with a roommate. She reports no support from friends or family  School/Work: Pt has been denied SSI and Medicaid. She is not employed Self-Care: Pt does not engage in substance use. Utilizes holistic approach to cope with medical health and stressors.  Life Changes: Pt reports having ongoing medical concerns (i.e chronic headaches, spine and heart conditions) She is grieving the losses of immediate family members  GOALS ADDRESSED: Patient will: 1. Reduce symptoms of: anxiety and depression 2. Increase knowledge and/or ability of: coping skills and stress reduction  3. Demonstrate ability to: Increase healthy adjustment to current life circumstances and Increase adequate support systems for patient/family  INTERVENTIONS: Interventions utilized: Mindfulness or Psychologist, educational, Supportive Counseling, Psychoeducation and/or Health Education and Link to [employer]  Standardized Assessments completed: GAD-7 and PHQ 2&9  ASSESSMENT: Patient currently experiencing depression  and anxiety triggered by ongoing medical concerns and grieving the loss of immediate family members. She reports overwhelming feelings of sadness, worry, grief, difficulty sleeping, low motivation, decreased concentration, and irritability. Denies SI/HI/AVH. Has no support system.   Patient may benefit from psychoeducation and psychotherapy. Pt had good insight on correlation between one's physical and mental health. LCSWA discussed how stress can negatively impacts one's health. Therapeutic interventions were discussed and pt agreed to implement healthy interventions to decrease symptoms. A referral to Legal Aid was submitted to assist with appeal for public benefits. Community resources for psychotherapy, grief support, and medication management were provided.  PLAN: 1. Follow up with behavioral health clinician on : Pt was encouraged to contact LCSWA if symptoms worsen or fail to improve to schedule behavioral appointments at [facility]. 2. Behavioral recommendations: LCSWA recommends that pt apply healthy coping skills discussed and utilize provided resources. Pt is encouraged to schedule follow up appointment with LCSWA 3. Referral(s): [place] (In Clinic) and [employer] Resources:  Legal Aid 4. "From scale of 1-10, how likely are you to follow plan?": 9/10  Rebekah [place], LCSW 01/20/18 4:27 PM

## 2018-01-18 NOTE — Progress Notes (Signed)
Subjective:  Patient ID: [name], female    DOB: Sep 25, 1986  Age: 32 y.o. MRN: [id number]  CC: Hospitalization Follow-up; Abdominal Pain; and Nausea   HPI [name] is a 32 year old right-handed female with a history of hypertension, POTS, PTSD, anxiety and depression who presents today for an ED follow-up where she was seen for abdominal pain, nausea and vomiting. Labs were unremarkable, CT abdomen and pelvis revealed no acute finding.  She was treated with IV fluids, morphine and Zofran after which she was discharged.  She informs me she has had an abdominal pain located in her umbilicus and "it feels like something will fall out of me".  Whenever she coughs or sits she feels like she is sitting on something.  She has associated nausea, vomiting, reduced appetite but no fever. Her LMP was on 01/10/17 and has noticed passage of clots with heavier than usual.  And this has lasted for more than the 4 days she usually has as her menstrual period is still on. Review of her pelvic ultrasound  reveals presence of fibroids and she informs me she was previously seen at the [facility] [place] was suggested which she declined.  Pelvic ultrasound from 04/15/17: EXAM: TRANSABDOMINAL AND TRANSVAGINAL ULTRASOUND OF PELVIS  TECHNIQUE: Both transabdominal and transvaginal ultrasound examinations of the pelvis were performed. Transabdominal technique was performed for global imaging of the pelvis including uterus, ovaries, adnexal regions, and pelvic cul-de-sac. It was necessary to proceed with endovaginal exam following the transabdominal exam to visualize the uterus, endometrium, ovaries and adnexa .  COMPARISON:  CT 04/07/2017  FINDINGS: Uterus  Measurements: 6.3 x 3.2 x 4.1 cm. Two fundal fibroids which are intramural, the largest 1.6 cm anteriorly.  Endometrium  Thickness: 3 mm in thickness.  No focal abnormality visualized.  Right ovary  Measurements:  4.2 x 2.2 x 2.3 cm. Multiple follicles. Normal appearance. No adnexal mass.  Left ovary  Measurements: 3.7 x 1.9 x 2.5 cm. Multiple follicles. Normal appearance. No adnexal mass.  Other findings  No free fluid.  IMPRESSION: Small uterine fibroids.  No acute findings.   Electronically Signed   By: Rolm Baptise M.D.   On: 04/15/2017 17:00  Breaks down crying because she feels sad due to her chronic medical conditions; she has no family her family are "deceased" but then she has 2 younger sisters and nephews whom she needs to live for.  Past Medical History:  Diagnosis Date  . Allergic rhinitis 01/24/2015  . Arrhythmia Dx 2015  . Back pain   . Breast mass   . Common migraine 04/15/2017  . Depression Dx 2000  . Fibromyalgia   . GERD (gastroesophageal reflux disease) 03/13/2015  . Headache    migraines  . Heart murmur Dx 2015  . 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 Hypertension Dx 01751  . Lumbar radiculopathy   . Pain disorder 08/29/2017  . Panic attack Dx 2006  . POTS (postural orthostatic tachycardia syndrome) 03/24/2014  . PTSD (post-traumatic stress disorder) Dx 2013  . Seizures ([place]) 2001  . Vitamin D deficiency 01/24/2015    Past Surgical History:  Procedure Laterality Date  . ESOPHAGOGASTRODUODENOSCOPY N/A 08/14/2015   Procedure: ESOPHAGOGASTRODUODENOSCOPY (EGD);  Surgeon: Carol Ada, MD;  Location: [facility];  Service: Endoscopy;  Laterality: N/A;    Allergies  Allergen Reactions  . Aspirin Hives     Outpatient Medications Prior to Visit  Medication Sig Dispense Refill  . dicyclomine (BENTYL)  20 MG tablet Take 1 tablet (20 mg total) by mouth 2 (two) times daily as needed for spasms. 20 tablet 0  . gabapentin (NEURONTIN) 300 MG capsule Take 1 capsule (300 mg total) by mouth at bedtime. 30 capsule 3  . methocarbamol (ROBAXIN) 500 MG tablet Take 1 tablet (500 mg total) by mouth every 8 (eight) hours as needed. 60 tablet 2  . metoprolol  succinate (TOPROL-XL) 100 MG 24 hr tablet Take 1 tablet (100 mg total) by mouth daily. 30 tablet 3  . NAPROXEN PO Take 220 mg by mouth as needed.     . ondansetron (ZOFRAN) 4 MG tablet Take 1 tablet (4 mg total) by mouth every 6 (six) hours. 12 tablet 0  . pregabalin (LYRICA) 100 MG capsule Take 100 mg by mouth 1 day or 1 dose.     . ranitidine (ZANTAC) 75 MG tablet Take 75 mg by mouth as needed for heartburn.    Marland Kitchen acetaminophen (TYLENOL) 500 MG tablet Take 2 tablets (1,000 mg total) by mouth every 8 (eight) hours as needed. (Patient not taking: Reported on 01/01/2018) 40 tablet 0   No facility-administered medications prior to visit.     ROS Review of Systems  Constitutional: Positive for appetite change. Negative for activity change and fatigue.  HENT: Negative for congestion, sinus pressure and sore throat.   Eyes: Negative for visual disturbance.  Respiratory: Negative for cough, chest tightness, shortness of breath and wheezing.   Cardiovascular: Negative for chest pain and palpitations.  Gastrointestinal: Positive for abdominal pain, nausea and vomiting. Negative for abdominal distention and constipation.  Endocrine: Negative for polydipsia.  Genitourinary: Negative for dysuria and frequency.  Musculoskeletal: Negative for arthralgias and back pain.  Skin: Negative for rash.  Neurological: Negative for tremors, light-headedness and numbness.  Hematological: Does not bruise/bleed easily.  Psychiatric/Behavioral: Positive for dysphoric mood. Negative for agitation and behavioral problems.    Objective:  BP 113/74   Pulse 97   Temp 98.5 F (36.9 C) (Oral)   Ht 5\' 10"  (1.778 m)   Wt 152 lb 9.6 oz (69.2 kg)   LMP 01/15/2018   SpO2 99%   BMI 21.90 kg/m   BP/Weight 01/18/2018 01/15/2018 3/81/0175  Systolic BP 102 585 277  Diastolic BP 74 89 84  Wt. (Lbs) 152.6 - -  BMI 21.9 - -      Physical Exam  Constitutional: She is oriented to person, place, and time. She appears  well-developed and well-nourished.  Acutely ill looking  Cardiovascular: Normal rate, normal heart sounds and intact distal pulses.  No murmur heard. Pulmonary/Chest: Effort normal and breath sounds normal. She has no wheezes. She has no rales. She exhibits no tenderness.  Abdominal: Soft. Bowel sounds are normal. She exhibits no distension and no mass. There is tenderness (diffuse abdominal TTP).  Musculoskeletal: Normal range of motion.  Neurological: She is alert and oriented to person, place, and time.  Psychiatric:  Dysphoric mood     CMP Latest Ref Rng & Units 01/15/2018 11/13/2017 10/03/2017  Glucose 65 - 99 mg/dL 121(H) 96 93  BUN 6 - 20 mg/dL 11 13 10   Creatinine 0.44 - 1.00 mg/dL 0.65 0.60 0.69  Sodium 135 - 145 mmol/L 139 138 141  Potassium 3.5 - 5.1 mmol/L 3.3(L) 3.6 3.9  Chloride 101 - 111 mmol/L 103 107 108  CO2 22 - 32 mmol/L 23 25 25   Calcium 8.9 - 10.3 mg/dL 9.4 9.3 9.0  Total Protein 6.5 - 8.1 g/dL 7.3 -  7.0  Total Bilirubin 0.3 - 1.2 mg/dL 1.4(H) - 0.9  Alkaline Phos 38 - 126 U/L 60 - 57  AST 15 - 41 U/L 20 - 16  ALT 14 - 54 U/L 11(L) - 10(L)    CBC    Component Value Date/Time   WBC 8.7 01/15/2018 1052   RBC 4.47 01/15/2018 1052   HGB 14.2 01/15/2018 1052   HGB 13.4 08/19/2017 1020   HCT 39.3 01/15/2018 1052   HCT 37.5 08/19/2017 1020   PLT 344 01/15/2018 1052   PLT 250 08/19/2017 1020   MCV 87.9 01/15/2018 1052   MCV 89 08/19/2017 1020   MCH 31.8 01/15/2018 1052   MCHC 36.1 (H) 01/15/2018 1052   RDW 12.2 01/15/2018 1052   RDW 13.2 08/19/2017 1020   LYMPHSABS 1.8 08/19/2017 1020   MONOABS 0.4 08/23/2015 1007   EOSABS 0.0 08/19/2017 1020   BASOSABS 0.0 08/19/2017 1020    Assessment & Plan:   1. Periumbilical abdominal pain I have given her a prescription for promethazine suppository Declines tramadol,Tylenol No. 3 - ketorolac (TORADOL) injection 60 mg  2. Intramural leiomyoma of uterus Leiomyomatous change could explain her current heavy  bleeding and severe pain Advised she would need to see her GYN  3. Menorrhagia with regular cycle Declined Mirena in the past as per patient  4. Situational depression LCSW called in for counseling She is not open to medications   Meds ordered this encounter  Medications  . promethazine (PHENERGAN) 25 MG suppository    Sig: Place 1 suppository (25 mg total) rectally every 8 (eight) hours as needed for nausea or vomiting.    Dispense:  20 each    Refill:  0  . ketorolac (TORADOL) injection 60 mg    Follow-up: Return if symptoms worsen or fail to improve, for Up of chronic medical condition, keep previously scheduled appointment.[name] MD

## 2018-01-18 NOTE — Patient Instructions (Signed)
Uterine Fibroids Uterine fibroids are tissue masses (tumors). They are also called leiomyomas. They can develop inside of a woman's womb (uterus). They can grow very large. Fibroids are not cancerous (benign). Most fibroids do not require medical treatment. Follow these instructions at home:  Keep all follow-up visits as told by your doctor. This is important.  Take medicines only as told by your doctor. ? If you were prescribed a hormone treatment, take the hormone medicines exactly as told. ? Do not take aspirin. It can cause bleeding.  Ask your doctor about taking iron pills and increasing the amount of dark green, leafy vegetables in your diet. These actions can help to boost your blood iron levels.  Pay close attention to your period. Tell your doctor about any changes, such as: ? Increased blood flow. This may require you to use more pads or tampons than usual per month. ? A change in the number of days that your period lasts per month. ? A change in symptoms that come with your period, such as back pain or cramping in your belly area (abdomen). Contact a doctor if:  You have pain in your back or the area between your hip bones (pelvic area) that is not controlled by medicines.  You have pain in your abdomen that is not controlled with medicines.  You have an increase in bleeding between and during periods.  You soak tampons or pads in a half hour or less.  You feel lightheaded.  You feel extra tired.  You feel weak. Get help right away if:  You pass out (faint).  You have a sudden increase in pelvic pain. This information is not intended to replace advice given to you by your health care provider. Make sure you discuss any questions you have with your health care provider. Document Released: 12/13/2010 Document Revised: 07/11/2016 Document Reviewed: 05/09/2014 Elsevier Interactive Patient Education  2018 Elsevier Inc.  

## 2018-01-20 ENCOUNTER — Encounter: Payer: Self-pay | Admitting: Family Medicine

## 2018-01-25 ENCOUNTER — Ambulatory Visit (INDEPENDENT_AMBULATORY_CARE_PROVIDER_SITE_OTHER): Payer: Self-pay | Admitting: Internal Medicine

## 2018-01-25 ENCOUNTER — Encounter: Payer: Self-pay | Admitting: Internal Medicine

## 2018-01-25 VITALS — BP 174/94 | HR 87 | Ht 70.0 in | Wt 147.0 lb

## 2018-01-25 DIAGNOSIS — I951 Orthostatic hypotension: Secondary | ICD-10-CM

## 2018-01-25 DIAGNOSIS — I498 Other specified cardiac arrhythmias: Secondary | ICD-10-CM

## 2018-01-25 DIAGNOSIS — G909 Disorder of the autonomic nervous system, unspecified: Secondary | ICD-10-CM

## 2018-01-25 DIAGNOSIS — G90A Postural orthostatic tachycardia syndrome (POTS): Secondary | ICD-10-CM

## 2018-01-25 DIAGNOSIS — R000 Tachycardia, unspecified: Secondary | ICD-10-CM

## 2018-01-25 NOTE — Progress Notes (Signed)
Patient Care Team: [name], MD as PCP - General (Family Medicine)   HPI  [name] is a 32 y.o. female Seen in followup for palpitations that I thought was possibly dysautonomic;  that report confirmed by the event recorder to which demonstrated recurrent sinus tachycardia. She also been noted to have right bundle branch block/RAD ventricular ectopy  She struggles with PTSD and anxiety. 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 She is raising her 2 sons of her older sister.  There was a family of "cardiac arrest", however, these occurred in the context of cancer and dialysis.  She continues to significant problems with pain and fatigue. She also has intermittent tachycardia palpitations with exertion.    With   elevated blood pressure and sinus tach we tried on betablockers which she stopped.  She has a history of syncope.  She was taken by EMS who picked her up from work and took to [facility] and she says she was not seen  careeverywhere identficied no records there  Records from [place] showed normal  Vs  We have just collected and the information from [facility]. Vital signs on arrival demonstrated a blood pressure 150/96 with a pulse of 80. This is in stark contrast was taken to the patient's saying that her heart rate was over 250. In addition the EMS narrative does  not describe attendance for syncope but rather for chest pain.  She comes in today after quite a hiatus.  There is been a great deal of emotional trauma with loss of her mother.  I asked her today if she were drowning or her nose were bulb water.  She remarked that she had some.  When asked what we could do for her today, she said I just need something in my days.  I cannot do this anymore.  Past Medical History:  Diagnosis Date  . Allergic rhinitis 01/24/2015  . Arrhythmia Dx 2015  . Back pain   . Breast mass   . Common migraine 04/15/2017  . Depression Dx 2000  . Fibromyalgia   .  GERD (gastroesophageal reflux disease) 03/13/2015  . Headache    migraines  . Heart murmur Dx 2015  . 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 [place] Dx [address]  . Lumbar radiculopathy   . Pain disorder 08/29/2017  . Panic attack Dx 2006  . POTS (postural orthostatic tachycardia syndrome) 03/24/2014  . PTSD (post-traumatic stress disorder) Dx 2013  . Seizures ([place]) 2001  . Vitamin D deficiency 01/24/2015    Past Surgical History:  Procedure Laterality Date  . ESOPHAGOGASTRODUODENOSCOPY N/A 08/14/2015   Procedure: ESOPHAGOGASTRODUODENOSCOPY (EGD);  Surgeon: Carol Ada, MD;  Location: [facility];  Service: Endoscopy;  Laterality: N/A;    Current Outpatient Medications  Medication Sig Dispense Refill  . acetaminophen (TYLENOL) 500 MG tablet Take 2 tablets (1,000 mg total) by mouth every 8 (eight) hours as needed. 40 tablet 0  . dicyclomine (BENTYL) 20 MG tablet Take 1 tablet (20 mg total) by mouth 2 (two) times daily as needed for spasms. 20 tablet 0  . gabapentin (NEURONTIN) 300 MG capsule Take 1 capsule (300 mg total) by mouth at bedtime. 30 capsule 3  . methocarbamol (ROBAXIN) 500 MG tablet Take 1 tablet (500 mg total) by mouth every 8 (eight) hours as needed. 60 tablet 2  . metoprolol succinate (TOPROL-XL) 100 MG 24 hr tablet Take 1 tablet (100 mg total) by mouth daily.  30 tablet 3  . NAPROXEN PO Take 220 mg by mouth as needed.     . ondansetron (ZOFRAN) 4 MG tablet Take 1 tablet (4 mg total) by mouth every 6 (six) hours. 12 tablet 0  . pregabalin (LYRICA) 100 MG capsule Take 100 mg by mouth 1 day or 1 dose.     . promethazine (PHENERGAN) 25 MG suppository Place 1 suppository (25 mg total) rectally every 8 (eight) hours as needed for nausea or vomiting. 20 each 0  . ranitidine (ZANTAC) 75 MG tablet Take 75 mg by mouth as needed for heartburn.     No current facility-administered medications for this visit.     Allergies  Allergen Reactions  . Aspirin Hives     Review of Systems negative except from HPI and PMH  Physical Exam BP (!) 174/94   Pulse 87   Ht 5\' 10"  (1.778 m)   Wt 147 lb (66.7 kg)   LMP 01/15/2018   SpO2 94%   BMI 21.09 kg/m  Well developed and nourished in no acute distress HENT normal Neck supple with JVP-flat Clear Regular rate and rhythm, no murmurs or gallops Abd-soft with active BS No Clubbing cyanosis edema Skin-warm and dry A & Oriented  Grossly normal sensory and motor function arachnodactyly      Assessment and  Plan  Ventricular ectopy-right bundle branch/RAD  Dysautonomia/orthostatic intolerance   Chest pain-atypical   Anxiety/PTSD  Elevated Blood pressure  She is passively suicidal.  I did not explore ideation.  I have called and spoken to the emergency room.  They will see her in consultation.   she has agreed to go.  I have asked her to let us know how things go.   We will see her again as needed.  We spent more than 50% of our >25 min visit in face to face counseling regarding the above  The patient was escorted to the bathroom.  She then left the building without further word.

## 2018-01-25 NOTE — Patient Instructions (Addendum)
Pt left AMA °

## 2018-01-27 ENCOUNTER — Telehealth: Payer: Self-pay

## 2018-01-27 NOTE — Telephone Encounter (Signed)
As per Abelino Derrick, [employer] [place], the patient is not yet eligible for their services, so the case has been closed. She has only applied for SSI once and was denied, She needs to apply again and if denied, she needs to appeal. She has been instructed about this process.

## 2018-01-29 MED FILL — METHOCARBAMOL 500 MG TABLET: 500 | 20 days supply | Qty: 60 | Fill #0

## 2018-01-29 MED FILL — PHENADOZ 25 MG SUPP: 25 | 6 days supply | Qty: 20 | Fill #0

## 2018-01-29 MED FILL — METOPROLOL SUCCINATE ER 100: 100 | 30 days supply | Qty: 30 | Fill #0

## 2018-01-29 MED FILL — GABAPENTIN 300 MG CAPSULE: 300 | 30 days supply | Qty: 30 | Fill #0

## 2018-02-01 ENCOUNTER — Ambulatory Visit (INDEPENDENT_AMBULATORY_CARE_PROVIDER_SITE_OTHER): Payer: Self-pay | Admitting: Surgery

## 2018-02-01 ENCOUNTER — Encounter: Payer: Self-pay | Admitting: Surgery

## 2018-02-01 VITALS — BP 156/77 | HR 105 | Temp 98.3°F | Ht 70.0 in | Wt 146.0 lb

## 2018-02-01 DIAGNOSIS — N6042 Mammary duct ectasia of left breast: Secondary | ICD-10-CM

## 2018-02-01 DIAGNOSIS — M79621 Pain in right upper arm: Secondary | ICD-10-CM

## 2018-02-01 NOTE — Progress Notes (Signed)
[name] is an 32 y.o. female.   Chief Complaint: Axillary mass with pain  Requesting physician Dr Charlane Ferretti  HPI: This patient with an axillary mass which is been present for at least two years on the right side.  It is painful.  She also thinks it is growing.  She had a workup in April 2018 showed which showed a 7 mm mass in the right axilla.  She describes considerable tenderness in the right axilla.  She is also had some clear drainage from the left breast.  She has a strong family history of breast ovarian and colon cancer almost all on her father's side  She is not employed  G1P0 AB 1  Past Medical History:  Diagnosis Date  . Allergic rhinitis 01/24/2015  . Arrhythmia Dx 2015  . Back pain   . Breast mass   . Common migraine 04/15/2017  . Depression Dx 2000  . Fibromyalgia   . GERD (gastroesophageal reflux disease) 03/13/2015  . Headache    migraines  . Heart murmur Dx 2015  . 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x [address]  . Lumbar radicular pain 11/22/2014  . Lumbar radiculopathy   . Pain disorder 08/29/2017  . Panic attack Dx 2006  . Positive depression screening 08/29/2017  . POTS (postural orthostatic tachycardia syndrome) 03/24/2014  . PTSD (post-traumatic stress disorder) Dx 2013  . Seizures ([place]) 2001  . Vitamin D deficiency 01/24/2015    Past Surgical History:  Procedure Laterality Date  . ESOPHAGOGASTRODUODENOSCOPY N/A 08/14/2015   Procedure: ESOPHAGOGASTRODUODENOSCOPY (EGD);  Surgeon: Carol Ada, MD;  Location: [facility];  Service: Endoscopy;  Laterality: N/A;    Family History  Problem Relation Age of Onset  . Heart attack Mother   . Heart disease Mother   . Sudden death Mother   . Sudden death Sister   . Heart attack Sister   . Sudden death Maternal Grandmother   . Fainting Maternal Grandmother   . Heart attack Maternal Grandmother   . Breast cancer Paternal Grandmother   . Breast cancer Cousin    Social History:   reports that she quit smoking about 5 years ago. she has never used smokeless tobacco. She reports that she drinks alcohol. She reports that she does not use drugs.  Allergies:  Allergies  Allergen Reactions  . Aspirin Hives     (Not in a hospital admission)   Review of Systems:   Review of Systems  Constitutional: Negative.   HENT: Negative.   Eyes: Negative.   Respiratory: Negative.   Cardiovascular: Negative.   Gastrointestinal: Negative.   Genitourinary: Negative.   Musculoskeletal: Negative.   Skin: Negative.   Neurological: Negative.   Endo/Heme/Allergies: Negative.   Psychiatric/Behavioral: Negative.     Physical Exam:  Physical Exam  Constitutional: She is oriented to person, place, and time and well-developed, well-nourished, and in no distress. No distress.  HENT:  Head: Normocephalic and atraumatic.  Eyes: Pupils are equal, round, and reactive to light. Right eye exhibits no discharge. Left eye exhibits no discharge. No scleral icterus.  Neck: Normal range of motion.  Cardiovascular: Normal rate and regular rhythm.  Pulmonary/Chest: Effort normal. No respiratory distress.  Abdominal: Soft. She exhibits no distension.  Musculoskeletal: Normal range of motion. She exhibits no edema or tenderness.  Tenderness in both axilla.  Bilateral.  No mass palpable in either axilla.  Lymphadenopathy:    She has no cervical adenopathy.  Neurological: She is alert and  oriented to person, place, and time.  Skin: Skin is warm and dry. She is not diaphoretic. No erythema.  Psychiatric:  Appears quite anxious  Vitals reviewed. Bilateral breast exam is performed supine and sitting.  No masses palpable in either breast.  No axillary adenopathy is noted but she is tender in both axilla.  There is are for different ducts with expressible clear drainage on the left no blood no discoloration no purulence.  (No guaiac cards were available for testing in the office).  Last  menstrual period 01/15/2018.    No results found for this or any previous visit (from the past 48 hour(s)). No results found.   Assessment/Plan This patient with long-standing bilateral axillary pain.  She had a workup back in April 2018 but did not proceed due to insurance problems and cost.  She continues with ongoing pain and thinks she has a mass palpable in her axilla.  She is quite thin and I cannot feel a mass at this time.  She has bilateral dense breasts without masses.  On the left (the uninvolved axillary side) there is drainage from 4 different ducts.  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  With all this in mind and if failed to workup in the last year I would recommend bilateral mammography and a right breast ultrasound and right axillary ultrasound with follow-up afterwards.  Florene Glen, MD, FACS

## 2018-02-02 NOTE — Addendum Note (Signed)
Addended by: Wayna Chalet on: 02/02/2018 09:42 AM   Modules accepted: Orders

## 2018-02-03 ENCOUNTER — Ambulatory Visit (INDEPENDENT_AMBULATORY_CARE_PROVIDER_SITE_OTHER): Payer: Self-pay | Admitting: Orthopaedic Surgery

## 2018-02-03 ENCOUNTER — Encounter (INDEPENDENT_AMBULATORY_CARE_PROVIDER_SITE_OTHER): Payer: Self-pay | Admitting: Orthopaedic Surgery

## 2018-02-03 VITALS — BP 118/79 | HR 91 | Ht 70.0 in | Wt 140.0 lb

## 2018-02-03 DIAGNOSIS — F450 Somatization disorder: Secondary | ICD-10-CM

## 2018-02-04 ENCOUNTER — Encounter (INDEPENDENT_AMBULATORY_CARE_PROVIDER_SITE_OTHER): Payer: Self-pay | Admitting: Orthopaedic Surgery
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
## 2018-02-05 ENCOUNTER — Encounter: Payer: Self-pay | Admitting: Neurology

## 2018-02-05 ENCOUNTER — Ambulatory Visit (INDEPENDENT_AMBULATORY_CARE_PROVIDER_SITE_OTHER): Payer: Self-pay | Admitting: Neurology

## 2018-02-05 VITALS — BP 118/74 | HR 100 | Ht 70.0 in | Wt 150.0 lb

## 2018-02-05 DIAGNOSIS — R200 Anesthesia of skin: Secondary | ICD-10-CM

## 2018-02-05 DIAGNOSIS — G43019 Migraine without aura, intractable, without status migrainosus: Secondary | ICD-10-CM

## 2018-02-05 DIAGNOSIS — R202 Paresthesia of skin: Secondary | ICD-10-CM

## 2018-02-05 DIAGNOSIS — R55 Syncope and collapse: Secondary | ICD-10-CM

## 2018-02-05 NOTE — Patient Instructions (Addendum)
1. Schedule MRI brain with and without contrast  We have sent a referral to [facility] for your MRI and they will call you directly to schedule your appt. They are located at [place]. If you need to contact them directly please call [phone].   2. Schedule EMG/NCV of the right arm and leg with Dr. Posey Pronto 3. Schedule 1-hour sleep-deprived EEG, then 48-hour EEG if this is normal 4. Try magnesium 400mg  and riboflavin (vitamin B2) 400mg  to help with migraines 5. As per [place] driving laws, no driving after an episode of loss of consciousness, until 6 months event-free 6. Follow-up after tests
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
## 2018-02-07 ENCOUNTER — Other Ambulatory Visit: Payer: Self-pay

## 2018-02-07 ENCOUNTER — Encounter (HOSPITAL_COMMUNITY): Payer: Self-pay | Admitting: Emergency Medicine

## 2018-02-07 ENCOUNTER — Emergency Department (HOSPITAL_COMMUNITY): Payer: Self-pay

## 2018-02-07 ENCOUNTER — Emergency Department (HOSPITAL_COMMUNITY)
Admission: EM | Admit: 2018-02-07 | Discharge: 2018-02-07 | Disposition: A | Discharge status: Home / Self Care | Payer: Self-pay | Attending: Emergency Medicine | Admitting: Emergency Medicine

## 2018-02-07 DIAGNOSIS — Z79899 Other long term (current) drug therapy: Secondary | ICD-10-CM | POA: Insufficient documentation

## 2018-02-07 DIAGNOSIS — I10 Essential (primary) hypertension: Secondary | ICD-10-CM | POA: Insufficient documentation

## 2018-02-07 DIAGNOSIS — R102 Pelvic and perineal pain: Secondary | ICD-10-CM | POA: Insufficient documentation

## 2018-02-07 DIAGNOSIS — Z87891 Personal history of nicotine dependence: Secondary | ICD-10-CM | POA: Insufficient documentation

## 2018-02-07 LAB — COMPREHENSIVE METABOLIC PANEL
ALT: 11 U/L — ABNORMAL LOW (ref 14–54)
AST: 20 U/L (ref 15–41)
Albumin: 4.5 g/dL (ref 3.5–5.0)
Alkaline Phosphatase: 76 U/L (ref 38–126)
Anion gap: 11 (ref 5–15)
BUN: 12 mg/dL (ref 6–20)
CO2: 23 mmol/L (ref 22–32)
Calcium: 9 mg/dL (ref 8.9–10.3)
Chloride: 105 mmol/L (ref 101–111)
Creatinine, Ser: 0.61 mg/dL (ref 0.44–1.00)
GFR calc Af Amer: >60 mL/min (ref >60)
GFR calc non Af Amer: >60 mL/min (ref >60)
Glucose, Bld: 133 mg/dL — ABNORMAL HIGH (ref 65–99)
Potassium: 3.1 mmol/L — ABNORMAL LOW (ref 3.5–5.1)
Sodium: 139 mmol/L (ref 135–145)
Total Bilirubin: 0.9 mg/dL (ref 0.3–1.2)
Total Protein: 7.5 g/dL (ref 6.5–8.1)

## 2018-02-07 LAB — LIPASE, BLOOD: Lipase: 23 U/L (ref 11–51)

## 2018-02-07 LAB — CBC
HCT: 37.6 % (ref 36.0–46.0)
Hemoglobin: 13.1 g/dL (ref 12.0–15.0)
MCH: 31.2 pg (ref 26.0–34.0)
MCHC: 34.8 g/dL (ref 30.0–36.0)
MCV: 89.5 fL (ref 78.0–100.0)
Platelets: 253 10*3/uL (ref 150–400)
RBC: 4.2 MIL/uL (ref 3.87–5.11)
RDW: 12.6 % (ref 11.5–15.5)
WBC: 8.9 10*3/uL (ref 4.0–10.5)

## 2018-02-07 LAB — URINALYSIS, ROUTINE W REFLEX MICROSCOPIC
Bacteria, UA: NONE SEEN
Bilirubin Urine: NEGATIVE
Glucose, UA: NEGATIVE mg/dL
Ketones, ur: 20 mg/dL — AB
Leukocytes, UA: NEGATIVE
Nitrite: NEGATIVE
Protein, ur: NEGATIVE mg/dL
Specific Gravity, Urine: 1.018 (ref 1.005–1.030)
WBC, UA: NONE SEEN WBC/hpf (ref 0–5)
pH: 8 (ref 5.0–8.0)

## 2018-02-07 LAB — I-STAT BETA HCG BLOOD, ED (MC, WL, AP ONLY): I-stat hCG, quantitative: <5 m[IU]/mL (ref <5)

## 2018-02-07 MED ORDER — HYDROCODONE-ACETAMINOPHEN 5-325 MG PO TABS: 1.0000 | ORAL_TABLET | Freq: Four times a day (QID) | ORAL | 0 refills | Status: DC | PRN

## 2018-02-07 MED ORDER — IOPAMIDOL (ISOVUE-300) INJECTION 61%
INTRAVENOUS | Status: AC
  Filled 2018-02-07: qty 100

## 2018-02-07 MED ORDER — SODIUM CHLORIDE 0.9 % IV BOLUS (SEPSIS)
1000.0000 mL | Freq: Once | INTRAVENOUS | Status: AC
  Administered 2018-02-07: 1000 mL via INTRAVENOUS

## 2018-02-07 MED ORDER — MORPHINE SULFATE (PF) 4 MG/ML IV SOLN
4.0000 mg | Freq: Once | INTRAVENOUS | Status: AC
  Administered 2018-02-07: 4 mg via INTRAVENOUS
  Filled 2018-02-07: qty 1

## 2018-02-07 MED ORDER — ONDANSETRON 4 MG PO TBDP: 4.0000 mg | ORAL_TABLET | Freq: Once | ORAL | Status: DC | PRN

## 2018-02-07 MED ORDER — ONDANSETRON HCL 4 MG/2ML IJ SOLN
4.0000 mg | Freq: Once | INTRAMUSCULAR | Status: AC
  Administered 2018-02-07: 4 mg via INTRAVENOUS
  Filled 2018-02-07: qty 2

## 2018-02-07 MED ORDER — IOPAMIDOL (ISOVUE-300) INJECTION 61%
100.0000 mL | Freq: Once | INTRAVENOUS | Status: AC | PRN
  Administered 2018-02-07: 100 mL via INTRAVENOUS

## 2018-02-07 NOTE — ED Triage Notes (Signed)
Pt comes in complaining of lower abd pain and n/v/d. Patient states pain suddenly came on this morning. Patient here in February for similar.

## 2018-02-07 NOTE — ED Provider Notes (Signed)
[facility] DEPT Provider Note   CSN: [id number] Arrival date & time: 02/07/18  0102     History   Chief Complaint Chief Complaint  Patient presents with  . Abdominal Pain  . Emesis    HPI [name] is a 32 y.o. female.  This patient is a 32 year old female with past medical history of fibromyalgia, depression, pots, PTSD, panic disorder, seizures presenting with complaints of abdominal pain.  This started yesterday and is rapidly worsening.  Her pain is located in the periumbilical region and causes her to feel nauseated.  She had a similar episode 1 month ago for which she was seen at [facility].  She underwent a CT scan and ultrasound, both which were unremarkable.  Her symptoms subsided, but now have returned.  She denies any urinary complaints.  She denies any abnormal periods or bleeding.   The history is provided by the patient.  Abdominal Pain   This is a recurrent problem. The current episode started yesterday. The problem occurs constantly. The problem has been rapidly worsening. The pain is associated with an unknown factor. The pain is located in the periumbilical region. The quality of the pain is cramping. The pain is severe. Pertinent negatives include constipation and dysuria. Nothing aggravates the symptoms. Nothing relieves the symptoms.    Past Medical History:  Diagnosis Date  . Allergic rhinitis 01/24/2015  . Arrhythmia Dx 2015  . Back pain   . Breast mass   . Common migraine 04/15/2017  . Depression Dx 2000  . Fibromyalgia   . GERD (gastroesophageal reflux disease) 03/13/2015  . Headache    migraines  . Heart murmur Dx 2015  . 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 Hypertension Dx 61950  . Lumbar radicular pain 11/22/2014  . Lumbar radiculopathy   . Pain disorder 08/29/2017  . Panic attack Dx 2006  . Positive depression screening 08/29/2017  . POTS (postural orthostatic tachycardia syndrome) 03/24/2014  .  PTSD (post-traumatic stress disorder) Dx 2013  . Seizures ([place]) 2001  . Vitamin D deficiency 01/24/2015    Patient Active Problem List   Diagnosis Date Noted  . Uterine fibroid 01/18/2018  . Cervical radiculopathy 01/01/2018  . Pain disorder 08/29/2017  . Positive depression screening 08/29/2017  . Neck pain without injury 08/24/2017  . Chronic fatigue 05/15/2017  . Common migraine 04/15/2017  . Axillary mass, right 04/09/2017  . History of bulimia nervosa 03/13/2015  . GERD (gastroesophageal reflux disease) 03/13/2015  . Pap smear for cervical cancer screening 03/13/2015  . SOB (shortness of breath) 03/13/2015  . H/O vitamin D deficiency 03/13/2015  . Health care maintenance 01/25/2015  . Chronic tension type headache 01/25/2015  . Muscle pain, fibromyalgia 01/24/2015  . Allergic rhinitis 01/24/2015  . Lumbar radicular pain 11/22/2014  . Thoracic neuralgia 11/22/2014  . Back pain   . Hemoglobin AC 07/03/2014  . POTS (postural orthostatic tachycardia syndrome) 03/24/2014  . Abdominal pain 06/06/2012  . Chronic pain 06/06/2012  . Depression 06/06/2012  . HEMOPTYSIS UNSPECIFIED 12/14/2009  . BREAST TENDERNESS 11/08/2009  . NIPPLE DISCHARGE 11/08/2009  . MENORRHAGIA 11/08/2009  . DISORDER, BIPOLAR NOS 08/06/2007  . PERSONALITY DISORDER 08/06/2007  . MARIJUANA ABUSE 08/06/2007  . NARCOTIC ABUSE 08/06/2007  . SEIZURE DISORDER 08/06/2007  . 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 ESOPHAGOGASTRODUODENOSCOPY N/A 08/14/2015   Procedure: ESOPHAGOGASTRODUODENOSCOPY (EGD);  Surgeon: Carol Ada, MD;  Location: [facility] ENDOSCOPY;  Service: Endoscopy;  Laterality: N/A;    OB History    Gravida Para Term Preterm AB Living   1       1     SAB TAB Ectopic Multiple Live Births   1       0       Home Medications    Prior to Admission medications   Medication Sig Start Date End Date Taking? Authorizing Provider  acetaminophen  (TYLENOL) 500 MG tablet Take 2 tablets (1,000 mg total) by mouth every 8 (eight) hours as needed. 11/06/17   Alfonse Spruce, FNP  dicyclomine (BENTYL) 20 MG tablet Take 1 tablet (20 mg total) by mouth 2 (two) times daily as needed for spasms. 01/15/18   Hayden Rasmussen, MD  gabapentin (NEURONTIN) 300 MG capsule Take 1 capsule (300 mg total) by mouth at bedtime. 01/01/18   [name], MD  methocarbamol (ROBAXIN) 500 MG tablet Take 1 tablet (500 mg total) by mouth every 8 (eight) hours as needed. 01/01/18   [name], MD  metoprolol succinate (TOPROL-XL) 100 MG 24 hr tablet Take 1 tablet (100 mg total) by mouth daily. 01/01/18   [name], MD  NAPROXEN PO Take 220 mg by mouth as needed.     [provider]  ondansetron (ZOFRAN) 4 MG tablet Take 1 tablet (4 mg total) by mouth every 6 (six) hours. 01/15/18   Hayden Rasmussen, MD  pregabalin (LYRICA) 100 MG capsule Take 100 mg by mouth 1 day or 1 dose.     [provider]  promethazine (PHENERGAN) 25 MG suppository Place 1 suppository (25 mg total) rectally every 8 (eight) hours as needed for nausea or vomiting. 01/18/18   [name], MD  ranitidine (ZANTAC) 75 MG tablet Take 75 mg by mouth as needed for heartburn.    [provider]    Family History Family History  Problem Relation Age of Onset  . Heart attack Mother   . Heart disease Mother   . Sudden death Mother   . Sudden death Sister   . Heart attack Sister   . Sudden death Maternal Grandmother   . Fainting Maternal Grandmother   . Heart attack Maternal Grandmother   . Breast cancer Paternal Grandmother   . Breast cancer Cousin   . Breast cancer Paternal Aunt   . Seizures Maternal Aunt     Social History Social History   Tobacco Use  . Smoking status: Former Smoker    Last attempt to quit: 06/23/2012    Years since quitting: 5.6  . Smokeless tobacco: Never Used  Substance Use Topics  . Alcohol use: Yes    Comment: socially  .  Drug use: No     Allergies   Aspirin   Review of Systems Review of Systems  Gastrointestinal: Negative for constipation.  Genitourinary: Negative for dysuria.  All other systems reviewed and are negative.    Physical Exam Updated Vital Signs BP (!) 159/114 (BP Location: Right Arm)   Pulse (!) 104   Temp 98.3 F (36.8 C) (Oral)   Resp 18   Ht 5\' 11"  (1.803 m)   Wt 68 kg (150 lb)   LMP 01/15/2018   SpO2 100%   BMI 20.92 kg/m   Physical Exam  Constitutional: She is oriented to person, place, and time. She appears well-developed and well-nourished. No distress.  Patient appears uncomfortable and is moaning and writhing.  HENT:  Head: Normocephalic and atraumatic.  Neck: Normal range  of motion. Neck supple.  Cardiovascular: Normal rate and regular rhythm. Exam reveals no gallop and no friction rub.  No murmur heard. Pulmonary/Chest: Effort normal and breath sounds normal. No respiratory distress. She has no wheezes.  Abdominal: Soft. Bowel sounds are normal. She exhibits no distension. There is tenderness in the periumbilical area. There is guarding. There is no rigidity and no rebound.  The abdomen is soft.  She reports tenderness in the periumbilical region.  There is voluntary guarding.  Musculoskeletal: Normal range of motion.  Neurological: She is alert and oriented to person, place, and time.  Skin: Skin is warm and dry. She is not diaphoretic.  Nursing note and vitals reviewed.    ED Treatments / Results  Labs (all labs ordered are listed, but only abnormal results are displayed) Labs Reviewed  LIPASE, BLOOD  COMPREHENSIVE METABOLIC PANEL  CBC  URINALYSIS, ROUTINE W REFLEX MICROSCOPIC  I-STAT BETA HCG BLOOD, ED ([facility], [facility], [facility] ONLY)    EKG  EKG Interpretation None       Radiology No results found.  Procedures Procedures (including critical care time)  Medications Ordered in ED Medications  ondansetron (ZOFRAN-ODT) disintegrating tablet 4 mg (not  administered)  sodium chloride 0.9 % bolus 1,000 mL (not administered)  ondansetron (ZOFRAN) injection 4 mg (not administered)  morphine 4 MG/ML injection 4 mg (not administered)     Initial Impression / Assessment and Plan / ED Course  I have reviewed the triage vital signs and the nursing notes.  Pertinent labs & imaging results that were available during my care of the patient were reviewed by me and considered in my medical decision making (see chart for details).  Patient is a 32 year old female presenting with complaints of abdominal pain.  She had a similar episode last month that she was told might be related to uterine fibroids.  CT scan last month was unremarkable as was her CT scan today.  After receiving medications in the ER, she is now feeling better.  At this point I see no indication for any further workup.  She is convinced her uterine fibroids are causing this discomfort and I will have her follow this up with GYN in the near future.  Nothing else appears emergent.  Final Clinical Impressions(s) / ED Diagnoses   Final diagnoses:  None    ED Discharge Orders    None       Veryl Speak, MD 02/07/18 [phone]

## 2018-02-07 NOTE — Discharge Instructions (Signed)
Hydrocodone is prescribed as needed for pain.  Follow-up with your gynecologist in the near future to discuss your situation.  Return to the emergency department if you develop worsening pain, high fevers, bloody stools, or other new and concerning symptoms.

## 2018-02-10 ENCOUNTER — Ambulatory Visit
Admission: RE | Admit: 2018-02-10 | Discharge: 2018-02-10 | Disposition: A | Discharge status: Home / Self Care | Payer: Self-pay | Source: Ambulatory Visit | Attending: Surgery | Admitting: Surgery

## 2018-02-10 ENCOUNTER — Other Ambulatory Visit: Payer: Self-pay | Admitting: Surgery

## 2018-02-10 DIAGNOSIS — N6042 Mammary duct ectasia of left breast: Secondary | ICD-10-CM

## 2018-02-10 DIAGNOSIS — R928 Other abnormal and inconclusive findings on diagnostic imaging of breast: Secondary | ICD-10-CM

## 2018-02-10 DIAGNOSIS — M79621 Pain in right upper arm: Secondary | ICD-10-CM | POA: Insufficient documentation

## 2018-02-10 DIAGNOSIS — N6322 Unspecified lump in the left breast, upper inner quadrant: Secondary | ICD-10-CM | POA: Insufficient documentation

## 2018-02-10 DIAGNOSIS — N6320 Unspecified lump in the left breast, unspecified quadrant: Secondary | ICD-10-CM

## 2018-02-13 ENCOUNTER — Encounter: Payer: Self-pay | Admitting: Neurology

## 2018-02-15 ENCOUNTER — Ambulatory Visit (INDEPENDENT_AMBULATORY_CARE_PROVIDER_SITE_OTHER): Payer: Self-pay | Admitting: Neurology

## 2018-02-15 ENCOUNTER — Other Ambulatory Visit: Payer: Self-pay | Admitting: Surgery

## 2018-02-15 DIAGNOSIS — R55 Syncope and collapse: Secondary | ICD-10-CM

## 2018-02-15 DIAGNOSIS — R202 Paresthesia of skin: Secondary | ICD-10-CM

## 2018-02-15 DIAGNOSIS — R200 Anesthesia of skin: Secondary | ICD-10-CM

## 2018-02-15 DIAGNOSIS — G43019 Migraine without aura, intractable, without status migrainosus: Secondary | ICD-10-CM

## 2018-02-15 NOTE — Procedures (Signed)
ELECTROENCEPHALOGRAM REPORT  Date of Study: 02/15/2018  Patient's Name: [name] MRN: [id number] Date of Birth: [birth year]  Referring Provider: Dr. Ellouise Newer  Clinical History: This is a 32 year old woman with recurrent episodes of loss of consciousness.  Medications: NEURONTIN 300 MG capsule  TYLENOL 500 MG tablet   NAPROXEN PO   ZANTAC 75 MG tablet  Technical Summary: A multichannel digital 1-hour sleep-deprived EEG recording measured by the international 10-20 system with electrodes applied with paste and impedances below 5000 ohms performed in our laboratory with EKG monitoring in an awake and asleep patient.  Hyperventilation and photic stimulation were performed.  The digital EEG was referentially recorded, reformatted, and digitally filtered in a variety of bipolar and referential montages for optimal display.    Description: The patient is awake and asleep during the recording.  During maximal wakefulness, there is a symmetric, medium voltage 10 Hz posterior dominant rhythm that attenuates with eye opening.  There is occasional focal 4-5 Hz independent focal slowing over the bilateral temporal regions, at times sharply contoured over the left temporal region without clear epileptogenic potential.  During drowsiness and sleep, there is an increase in theta slowing of the background.  Vertex waves and symmetric sleep spindles were seen.  Hyperventilation and photic stimulation did not elicit any abnormalities.  There were no clear epileptiform discharges or electrographic seizures seen.    EKG lead was unremarkable.  Impression: This 1-hour awake and asleep EEG is abnormal due to occasional independent focal slowing over the bilateral temporal regions.  Clinical Correlation of the above findings indicates focal cerebral dysfunction over the bilateral temporal regions suggestive of underlying structural or physiologic abnormality. The absence of epileptiform discharges  does not exclude a clinical diagnosis of epilepsy.  If further clinical questions remain, prolonged EEG may be helpful.  Clinical correlation is advised.   Ellouise Newer, M.D.

## 2018-02-16 ENCOUNTER — Encounter: Payer: Self-pay | Admitting: Physical Medicine & Rehabilitation

## 2018-02-16 ENCOUNTER — Encounter: Payer: Self-pay | Attending: Physical Medicine & Rehabilitation

## 2018-02-16 ENCOUNTER — Ambulatory Visit (INDEPENDENT_AMBULATORY_CARE_PROVIDER_SITE_OTHER): Payer: Self-pay | Admitting: Neurology

## 2018-02-16 ENCOUNTER — Ambulatory Visit (HOSPITAL_BASED_OUTPATIENT_CLINIC_OR_DEPARTMENT_OTHER): Payer: Self-pay | Admitting: Physical Medicine & Rehabilitation

## 2018-02-16 VITALS — BP 130/92 | HR 101

## 2018-02-16 DIAGNOSIS — M797 Fibromyalgia: Secondary | ICD-10-CM

## 2018-02-16 DIAGNOSIS — Z803 Family history of malignant neoplasm of breast: Secondary | ICD-10-CM | POA: Insufficient documentation

## 2018-02-16 DIAGNOSIS — R55 Syncope and collapse: Secondary | ICD-10-CM

## 2018-02-16 DIAGNOSIS — Z87891 Personal history of nicotine dependence: Secondary | ICD-10-CM | POA: Insufficient documentation

## 2018-02-16 DIAGNOSIS — R200 Anesthesia of skin: Secondary | ICD-10-CM

## 2018-02-16 DIAGNOSIS — F4310 Post-traumatic stress disorder, unspecified: Secondary | ICD-10-CM | POA: Insufficient documentation

## 2018-02-16 DIAGNOSIS — M545 Low back pain: Secondary | ICD-10-CM

## 2018-02-16 DIAGNOSIS — G43019 Migraine without aura, intractable, without status migrainosus: Secondary | ICD-10-CM

## 2018-02-16 DIAGNOSIS — I10 Essential (primary) hypertension: Secondary | ICD-10-CM | POA: Insufficient documentation

## 2018-02-16 DIAGNOSIS — Z8249 Family history of ischemic heart disease and other diseases of the circulatory system: Secondary | ICD-10-CM | POA: Insufficient documentation

## 2018-02-16 DIAGNOSIS — G8929 Other chronic pain: Secondary | ICD-10-CM

## 2018-02-16 DIAGNOSIS — X58XXXA Exposure to other specified factors, initial encounter: Secondary | ICD-10-CM | POA: Insufficient documentation

## 2018-02-16 DIAGNOSIS — F329 Major depressive disorder, single episode, unspecified: Secondary | ICD-10-CM | POA: Insufficient documentation

## 2018-02-16 DIAGNOSIS — Z820 Family history of epilepsy and other diseases of the nervous system: Secondary | ICD-10-CM | POA: Insufficient documentation

## 2018-02-16 DIAGNOSIS — K219 Gastro-esophageal reflux disease without esophagitis: Secondary | ICD-10-CM | POA: Insufficient documentation

## 2018-02-16 DIAGNOSIS — R202 Paresthesia of skin: Principal | ICD-10-CM

## 2018-02-16 DIAGNOSIS — Z9889 Other specified postprocedural states: Secondary | ICD-10-CM | POA: Insufficient documentation

## 2018-02-16 NOTE — Patient Instructions (Signed)
Myofascial Pain Syndrome and Fibromyalgia Myofascial pain syndrome and fibromyalgia are both pain disorders. This pain may be felt mainly in your muscles.  Myofascial pain syndrome: ? Always has trigger points or tender points in the muscle that will cause pain when pressed. The pain may come and go. ? Usually affects your neck, upper back, and shoulder areas. The pain often radiates into your arms and hands.  Fibromyalgia: ? Has muscle pains and tenderness that come and go. ? Is often associated with fatigue and sleep disturbances. ? Has trigger points. ? Tends to be long-lasting (chronic), but is not life-threatening.  Fibromyalgia and myofascial pain are not the same. However, they often occur together. If you have both conditions, each can make the other worse. Both are common and can cause enough pain and fatigue to make day-to-day activities difficult. What are the causes? The exact causes of fibromyalgia and myofascial pain are not known. People with certain gene types may be more likely to develop fibromyalgia. Some factors can be triggers for both conditions, such as:  Spine disorders.  Arthritis.  Severe injury (trauma) and other physical stressors.  Being under a lot of stress.  A medical illness.  What are the signs or symptoms? Fibromyalgia The main symptom of fibromyalgia is widespread pain and tenderness in your muscles. This can vary over time. Pain is sometimes described as stabbing, shooting, or burning. You may have tingling or numbness, too. You may also have sleep problems and fatigue. You may wake up feeling tired and groggy (fibro fog). Other symptoms may include:  Bowel and bladder problems.  Headaches.  Visual problems.  Problems with odors and noises.  Depression or mood changes.  Painful menstrual periods (dysmenorrhea).  Dry skin or eyes.  Myofascial pain syndrome Symptoms of myofascial pain syndrome include:  Tight, ropy bands of  muscle.  Uncomfortable sensations in muscular areas, such as: ? Aching. ? Cramping. ? Burning. ? Numbness. ? Tingling. ? Muscle weakness.  Trouble moving certain muscles freely (range of motion).  How is this diagnosed? There are no specific tests to diagnose fibromyalgia or myofascial pain syndrome. Both can be hard to diagnose because their symptoms are common in many other conditions. Your health care provider may suspect one or both of these conditions based on your symptoms and medical history. Your health care provider will also do a physical exam. The key to diagnosing fibromyalgia is having pain, fatigue, and other symptoms for more than three months that cannot be explained by another condition. The key to diagnosing myofascial pain syndrome is finding trigger points in muscles that are tender and cause pain elsewhere in your body (referred pain). How is this treated? Treating fibromyalgia and myofascial pain often requires a team of health care providers. This usually starts with your primary provider and a physical therapist. You may also find it helpful to work with alternative health care providers, such as massage therapists or acupuncturists. Treatment for fibromyalgia may include medicines. This may include nonsteroidal anti-inflammatory drugs (NSAIDs), along with other medicines. Treatment for myofascial pain may also include:  NSAIDs.  Cooling and stretching of muscles.  Trigger point injections.  Sound wave (ultrasound) treatments to stimulate muscles.  Follow these instructions at home:  Take medicines only as directed by your health care provider.  Exercise as directed by your health care provider or physical therapist.  Try to avoid stressful situations.  Practice relaxation techniques to control your stress. You may want to try: ? Biofeedback. ? Visual   imagery. ? Hypnosis. ? Muscle relaxation. ? Yoga. ? Meditation.  Talk to your health care provider  about alternative treatments, such as acupuncture or massage treatment.  Maintain a healthy lifestyle. This includes eating a healthy diet and getting enough sleep.  Consider joining a support group.  Do not do activities that stress or strain your muscles. That includes repetitive motions and heavy lifting. Where to find more information:  National Fibromyalgia Association: www.fmaware.org  Arthritis Foundation: www.arthritis.org  American Chronic Pain Association: www.theacpa.org/condition/myofascial-pain Contact a health care provider if:  You have new symptoms.  Your symptoms get worse.  You have side effects from your medicines.  You have trouble sleeping.  Your condition is causing depression or anxiety. This information is not intended to replace advice given to you by your health care provider. Make sure you discuss any questions you have with your health care provider. Document Released: 11/10/2005 Document Revised: 04/17/2016 Document Reviewed: 08/16/2014 Elsevier Interactive Patient Education  2018 Elsevier Inc.  

## 2018-02-16 NOTE — Procedures (Signed)
[facility]  [address]  [place], [place] [address] Tel: [phone] Fax:  [phone] Test Date:  02/16/2018  Patient: [name] DOB: Feb 25, 1986 Physician: Narda Amber, DO  Sex: Female Height: 5\' 10"  Ref Phys: Ellouise Newer, MD  ID#: [id number] Temp: 32.0C Technician:    Patient Complaints: This is a 32 year old female referred for evaluation of pain and weakness, worse in the right.  NCV & EMG Findings: Extensive electrodiagnostic testing of the right upper and lower extremity shows:  1. All sensory responses including the right median, ulnar, mixed palmer, sural, and superficial peroneal nerves are within normal limits. 2. All motor responses including the right median, ulnar, peroneal, and tibial nerves are within normal limits. 3. Right tibial H reflex study is within normal limits. 4. There is no evidence of active or chronic motor axon loss changes affecting any of the tested muscles. Motor unit configuration and recruitment pattern is within normal limits.  Impression: This is a normal study. In particular, there is no evidence of a sensorimotor polyneuropathy, diffuse myopathy, or cervical/lumbosacral radiculopathy affecting the right side.   ___________________________ Narda Amber, DO    Nerve Conduction Studies Anti Sensory Summary Table   Site NR Peak (ms) Norm Peak (ms) P-T Amp (V) Norm P-T Amp  Right Median Anti Sensory (2nd Digit)  32C  Wrist    3.2 <3.4 48.4 >20  Right Sup Peroneal Anti Sensory (Ant Lat Mall)  32C  12 cm    2.7 <4.5 12.9 >5  Right Sural Anti Sensory (Lat Mall)  32C  Calf    3.5 <4.5 25.8 >5  Right Ulnar Anti Sensory (5th Digit)  32C  Wrist    2.8 <3.1 62.3 >12   Motor Summary Table   Site NR Onset (ms) Norm Onset (ms) O-P Amp (mV) Norm O-P Amp Site1 Site2 Delta-0 (ms) Dist (cm) Vel (m/s) Norm Vel (m/s)  Right Median Motor (Abd Poll Brev)  32C  Wrist    2.9 <3.9 8.2 >6 Elbow Wrist 4.8 28.0 58 >50    Elbow    7.7  8.0         Right Peroneal Motor (Ext Dig Brev)  32C  Ankle    2.6 <5.5 7.5 >3 B Fib Ankle 8.4 38.0 45 >40  B Fib    11.0  6.8  Poplt B Fib 1.3 8.0 62 >40  Poplt    12.3  6.8         Right Tibial Motor (Abd Hall Brev)  32C  Ankle    3.8 <6.0 12.5 >8 Knee Ankle 9.2 41.0 45 >40  Knee    13.0  8.6         Right Ulnar Motor (Abd Dig Minimi)  32C  Wrist    2.3 <3.1 11.6 >7 B Elbow Wrist 4.3 25.0 58 >50  B Elbow    6.6  11.6  A Elbow B Elbow 1.8 10.0 56 >50  A Elbow    8.4  11.2          Comparison Summary Table   Site NR Peak (ms) Norm Peak (ms) P-T Amp (V) Site1 Site2 Delta-P (ms) Norm Delta (ms)  Right Median/Ulnar Palm Comparison (Wrist - 8cm)  32C  Median Palm    1.5 <2.2 76.4 Median Palm Ulnar Palm 0.1   Ulnar Palm    1.4 <2.2 12.5       H Reflex Studies   NR H-Lat (ms) Lat Norm (ms) L-R  H-Lat (ms)  Right Tibial (Gastroc)  32C     33.06 <35    EMG   Side Muscle Ins Act Fibs Psw Fasc Number Recrt Dur Dur. Amp Amp. Poly Poly. Comment  Right AntTibialis Nml Nml Nml Nml Nml Nml Nml Nml Nml Nml Nml Nml N/A  Right Gastroc Nml Nml Nml Nml Nml Nml Nml Nml Nml Nml Nml Nml N/A  Right RectFemoris Nml Nml Nml Nml Nml Nml Nml Nml Nml Nml Nml Nml N/A  Right GluteusMed Nml Nml Nml Nml Nml Nml Nml Nml Nml Nml Nml Nml N/A  Right BicepsFemS Nml Nml Nml Nml Nml Nml Nml Nml Nml Nml Nml Nml N/A  Right 1stDorInt Nml Nml Nml Nml Nml Nml Nml Nml Nml Nml Nml Nml N/A  Right PronatorTeres Nml Nml Nml Nml Nml Nml Nml Nml Nml Nml Nml Nml N/A  Right Biceps Nml Nml Nml Nml Nml Nml Nml Nml Nml Nml Nml Nml N/A  Right Triceps Nml Nml Nml Nml Nml Nml Nml Nml Nml Nml Nml Nml N/A  Right Deltoid Nml Nml Nml Nml Nml Nml Nml Nml Nml Nml Nml Nml N/A      Waveforms:
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
## 2018-02-17 ENCOUNTER — Telehealth: Payer: Self-pay

## 2018-02-17 NOTE — Telephone Encounter (Signed)
-----   Message from Cameron Sprang, MD sent at 02/16/2018 12:31 PM EDT ----- Pls let her know the nerve and muscle test was normal. The brain wave test did not show any seizure discharges, proceed with 48-hour EEG and MRI brain as discussed, thanks

## 2018-02-17 NOTE — Telephone Encounter (Signed)
Called pt.  No answer.  VM box full.  unable to relay message below.

## 2018-02-22 ENCOUNTER — Telehealth: Payer: Self-pay | Admitting: Neurology

## 2018-02-22 NOTE — Telephone Encounter (Signed)
Spoke with pt relaying message below.  She is scheduled for 48 hour EEG on April 15.  She has not heard from [facility] in regards to MRI.  Gave phone number to their scheduling department.  Pt able to view results on MyChart, she is concerned about one phrase specifically -  of the above findings indicates focal cerebral dysfunction over the bilateral temporal regions suggestive of underlying structural or physiologic abnormality - pt would like further explanation of this.  Please advise.

## 2018-02-22 NOTE — Telephone Encounter (Signed)
Pls let her know that this is very nonspecific, these changes can be seen as we grow older, or with migraines or headaches. They do not cause any symptoms. This is where the MRI brain would be helpful to take a closer look at those areas of the brain, but also the 48-hour EEG to see if it provides further information about her symptoms. Thanks.

## 2018-02-22 NOTE — Telephone Encounter (Signed)
Pt called and wanted to know if her results were ready yet for the EMG and EEG, having really bad headaches and didn't want to start medication until she new the results

## 2018-02-23 ENCOUNTER — Other Ambulatory Visit: Payer: Self-pay

## 2018-02-23 NOTE — Telephone Encounter (Signed)
Spoke with pt relaying message below.  She states that she has been having a consistent daily headache/migraine.  She states that she has called [facility] and LM but has not heard back about scheduling MRI

## 2018-02-24 ENCOUNTER — Ambulatory Visit
Admission: RE | Admit: 2018-02-24 | Discharge: 2018-02-24 | Disposition: A | Discharge status: Home / Self Care | Payer: PRIVATE HEALTH INSURANCE | Source: Ambulatory Visit | Attending: Neurology | Admitting: Neurology

## 2018-02-24 DIAGNOSIS — R55 Syncope and collapse: Secondary | ICD-10-CM

## 2018-02-24 DIAGNOSIS — R200 Anesthesia of skin: Secondary | ICD-10-CM

## 2018-02-24 DIAGNOSIS — G43019 Migraine without aura, intractable, without status migrainosus: Secondary | ICD-10-CM

## 2018-02-24 DIAGNOSIS — R202 Paresthesia of skin: Secondary | ICD-10-CM

## 2018-02-24 MED ORDER — GADOBENATE DIMEGLUMINE 529 MG/ML IV SOLN
13.0000 mL | Freq: Once | INTRAVENOUS | Status: AC | PRN
  Administered 2018-02-24: 13 mL via INTRAVENOUS

## 2018-02-25 ENCOUNTER — Ambulatory Visit: Payer: PRIVATE HEALTH INSURANCE | Admitting: Obstetrics & Gynecology

## 2018-02-26 ENCOUNTER — Ambulatory Visit
Admission: RE | Admit: 2018-02-26 | Discharge: 2018-02-26 | Disposition: A | Discharge status: Home / Self Care | Payer: Self-pay | Source: Ambulatory Visit | Attending: Surgery | Admitting: Surgery

## 2018-02-26 DIAGNOSIS — R928 Other abnormal and inconclusive findings on diagnostic imaging of breast: Secondary | ICD-10-CM

## 2018-02-26 DIAGNOSIS — N6321 Unspecified lump in the left breast, upper outer quadrant: Secondary | ICD-10-CM | POA: Insufficient documentation

## 2018-02-26 DIAGNOSIS — N6320 Unspecified lump in the left breast, unspecified quadrant: Secondary | ICD-10-CM

## 2018-03-01 DIAGNOSIS — Z0271 Encounter for disability determination: Secondary | ICD-10-CM

## 2018-03-01 LAB — SURGICAL PATHOLOGY

## 2018-03-03 ENCOUNTER — Telehealth: Payer: Self-pay | Admitting: General Practice

## 2018-03-03 NOTE — Telephone Encounter (Signed)
Patient is calling said she had a biopsy done on the 02/26/18 and is having some blood, also swollen hard to lay on left side also having some soreness. Please call patient and advise.

## 2018-03-03 NOTE — Telephone Encounter (Signed)
Called patient back and she stated that she had a breast biopsy done on 02/26/2018. However, on Saturday 02/27/2018, she started to have minor bleeding. She stated that before she left [facility] they had told her that she would have minor bleeding, swelling and possible bruising. She then called [facility] on Monday 03/01/2018 and she was told to take tylenol for the pain and swelling and to apply ice on the site. She had done everything that was told but the bleeding did not stop until today. She stated that she had continued to take tylenol and applied the ice on the site and she has not had any more bleeding but wanted to make sure that she would be fine. I told her to continue doing what has been recommended by [facility] but to also wear a sports bra to help with the swelling. I told her that if it got worse by Friday morning to give [place] a call so we could see her then. If not, then I scheduled her an appointment to see Dr. Burt Knack on 03/08/2018 at 11:15 AM. Patient agreed and had no further questions.

## 2018-03-08 ENCOUNTER — Ambulatory Visit (INDEPENDENT_AMBULATORY_CARE_PROVIDER_SITE_OTHER): Payer: Self-pay | Admitting: Neurology

## 2018-03-08 DIAGNOSIS — R200 Anesthesia of skin: Secondary | ICD-10-CM

## 2018-03-08 DIAGNOSIS — R55 Syncope and collapse: Secondary | ICD-10-CM

## 2018-03-08 DIAGNOSIS — R202 Paresthesia of skin: Secondary | ICD-10-CM

## 2018-03-08 DIAGNOSIS — G43019 Migraine without aura, intractable, without status migrainosus: Secondary | ICD-10-CM

## 2018-03-18 ENCOUNTER — Other Ambulatory Visit: Payer: No Typology Code available for payment source

## 2018-03-23 ENCOUNTER — Other Ambulatory Visit: Payer: Self-pay

## 2018-03-23 ENCOUNTER — Ambulatory Visit (INDEPENDENT_AMBULATORY_CARE_PROVIDER_SITE_OTHER): Payer: Self-pay | Admitting: Neurology

## 2018-03-23 ENCOUNTER — Encounter: Payer: Self-pay | Admitting: Neurology

## 2018-03-23 VITALS — BP 112/64 | HR 125 | Ht 70.0 in | Wt 150.0 lb

## 2018-03-23 DIAGNOSIS — M5416 Radiculopathy, lumbar region: Secondary | ICD-10-CM

## 2018-03-23 DIAGNOSIS — R202 Paresthesia of skin: Secondary | ICD-10-CM

## 2018-03-23 DIAGNOSIS — G43019 Migraine without aura, intractable, without status migrainosus: Secondary | ICD-10-CM

## 2018-03-23 DIAGNOSIS — R200 Anesthesia of skin: Secondary | ICD-10-CM

## 2018-03-23 DIAGNOSIS — M545 Low back pain, unspecified: Secondary | ICD-10-CM

## 2018-03-23 DIAGNOSIS — M5412 Radiculopathy, cervical region: Secondary | ICD-10-CM

## 2018-03-23 MED ORDER — GABAPENTIN 300 MG PO CAPS: ORAL_CAPSULE | ORAL | 6 refills | Status: DC

## 2018-03-23 NOTE — Patient Instructions (Addendum)
1. Increase Gabapentin 300mg : Take 2 capsules at night for 2 weeks. If no significant side effects, increase to 1 cap in AM, 2 caps in PM. 2. Schedule MRI lumbar spine with and without contrast 3. Follow-up with Dr. Caryl Comes, call his office for regular follow-up 4. Proceed with PT  5. Follow-up in 4-5 months, call for any changes
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
## 2018-03-25 ENCOUNTER — Encounter (HOSPITAL_COMMUNITY): Payer: Self-pay

## 2018-03-25 ENCOUNTER — Ambulatory Visit
Admission: RE | Admit: 2018-03-25 | Discharge: 2018-03-25 | Disposition: A | Discharge status: Home / Self Care | Payer: No Typology Code available for payment source | Source: Ambulatory Visit | Attending: Obstetrics and Gynecology | Admitting: Obstetrics and Gynecology

## 2018-03-25 ENCOUNTER — Ambulatory Visit (HOSPITAL_COMMUNITY)
Admission: RE | Admit: 2018-03-25 | Discharge: 2018-03-25 | Disposition: A | Discharge status: Home / Self Care | Payer: Self-pay | Source: Ambulatory Visit | Attending: Obstetrics and Gynecology | Admitting: Obstetrics and Gynecology

## 2018-03-25 ENCOUNTER — Other Ambulatory Visit (HOSPITAL_COMMUNITY): Payer: Self-pay | Admitting: Obstetrics and Gynecology

## 2018-03-25 ENCOUNTER — Other Ambulatory Visit (HOSPITAL_COMMUNITY): Payer: Self-pay | Admitting: *Deleted

## 2018-03-25 VITALS — BP 102/64 | Ht 70.0 in

## 2018-03-25 DIAGNOSIS — N6452 Nipple discharge: Secondary | ICD-10-CM

## 2018-03-25 DIAGNOSIS — N6311 Unspecified lump in the right breast, upper outer quadrant: Secondary | ICD-10-CM

## 2018-03-25 DIAGNOSIS — N644 Mastodynia: Secondary | ICD-10-CM

## 2018-03-25 DIAGNOSIS — Z01419 Encounter for gynecological examination (general) (routine) without abnormal findings: Secondary | ICD-10-CM

## 2018-03-25 DIAGNOSIS — N6310 Unspecified lump in the right breast, unspecified quadrant: Secondary | ICD-10-CM

## 2018-03-25 DIAGNOSIS — N6321 Unspecified lump in the left breast, upper outer quadrant: Secondary | ICD-10-CM

## 2018-03-25 NOTE — Addendum Note (Signed)
Encounter addended by: Loletta Parish, RN on: 03/25/2018 11:02 AM  Actions taken: Charge Capture section accepted, Visit diagnoses modified, Sign clinical note

## 2018-03-25 NOTE — Progress Notes (Signed)
Complaints of left breast pain since left breast biopsy on 02/26/2018. Patient states the pain is constant and rates at a 7.5-8 out of 10. Patient complained of bilateral clear to yellowish colored spontaneous discharge. Patient complained of a right axillary lump x 2 years that has been followed and constant pain. Patient states the pain is a 10 out of 10. Patient complained that she has a left breast lump that a biopsy was completed 02/26/2018 that was benign.  Pap Smear: Pap smear completed today. Last Pap smear was 03/03/2015 at [facility] and normal. Per patient has no history of an abnormal Pap smear. Last Pap smear result is in Epic.  Physical exam: Breasts Breasts symmetrical. No skin abnormalities bilateral breasts. No nipple retraction bilateral breasts. Bilateral clear colored nipple discharge expressed on exam. Sample of both breasts discharge sent to cytology for evaluation. No lymphadenopathy. Palpated a lump within the left breast at 1 o'clock 4 cm from the nipple. Palpated a lump within the right breast at 11 o'clock 5 cm from the nipple. Patient stated that is a new lump. Complaints of bilateral diffuse breast pain on exam. Referred patient to the [facility] for a diagnostic mammogram and possible bilateral breast ultrasounds. Appointment scheduled for Thursday, Mar 25, 2018 at 1120.        Pelvic/Bimanual   Ext Genitalia No lesions, no swelling and no discharge observed on external genitalia.         Vagina Vagina pink and normal texture. No lesions or discharge observed in vagina.          Cervix Cervix is present. Cervix pink and of normal texture. No discharge observed.     Uterus Uterus is present and palpable. Uterus in normal position and normal size.        Adnexae Bilateral ovaries present and palpable. No tenderness on palpation.         Rectovaginal No rectal exam completed today since patient had no rectal complaints. No  skin abnormalities observed on exam.    Smoking History: Patient is a former smoker that stated she quit 06/23/2012.  Patient Navigation: Patient education provided. Access to services provided for patient through [facility] program.   Breast and Cervical Cancer Risk Assessment: Patient has a family history of a first cousin, a paternal aunt, and paternal grandmother being diagnosed with breast cancer. Patient has no known genetic mutations or history of radiation treatment to the chest before age 70. Patient has no history of cervical dysplasia, immunocompromised, or DES exposure in-utero.

## 2018-03-25 NOTE — Patient Instructions (Signed)
Explained breast self awareness with [name]. Let patient know [facility] will cover Pap smears and HPV typing every 5 years unless has a history of abnormal Pap smears. Referred patient to the [facility] for a diagnostic mammogram and possible bilateral breast ultrasounds. Appointment scheduled for Thursday, Mar 25, 2018 at 1120. Let patient know will follow up with her within the next couple weeks with results of Pap smear by letter or phone. Informed patient will call her with the results of her breast discharge. [name] verbalized understanding.  Suzanne Kho, Arvil Chaco, RN 10:51 AM

## 2018-03-26 LAB — CYTOLOGY - PAP
Diagnosis: NEGATIVE
HPV: NOT DETECTED

## 2018-03-30 ENCOUNTER — Encounter: Payer: Self-pay | Attending: Physical Medicine & Rehabilitation

## 2018-03-30 ENCOUNTER — Encounter: Payer: Self-pay | Admitting: Physical Medicine & Rehabilitation

## 2018-03-30 ENCOUNTER — Ambulatory Visit (HOSPITAL_BASED_OUTPATIENT_CLINIC_OR_DEPARTMENT_OTHER): Payer: Self-pay | Admitting: Physical Medicine & Rehabilitation

## 2018-03-30 VITALS — BP 108/74 | HR 95 | Ht 70.0 in | Wt 152.0 lb

## 2018-03-30 DIAGNOSIS — M797 Fibromyalgia: Secondary | ICD-10-CM

## 2018-03-30 DIAGNOSIS — K219 Gastro-esophageal reflux disease without esophagitis: Secondary | ICD-10-CM | POA: Insufficient documentation

## 2018-03-30 DIAGNOSIS — Z8249 Family history of ischemic heart disease and other diseases of the circulatory system: Secondary | ICD-10-CM | POA: Insufficient documentation

## 2018-03-30 DIAGNOSIS — I10 Essential (primary) hypertension: Secondary | ICD-10-CM | POA: Insufficient documentation

## 2018-03-30 DIAGNOSIS — Z9889 Other specified postprocedural states: Secondary | ICD-10-CM | POA: Insufficient documentation

## 2018-03-30 DIAGNOSIS — F329 Major depressive disorder, single episode, unspecified: Secondary | ICD-10-CM | POA: Insufficient documentation

## 2018-03-30 DIAGNOSIS — K5909 Other constipation: Secondary | ICD-10-CM

## 2018-03-30 DIAGNOSIS — F4310 Post-traumatic stress disorder, unspecified: Secondary | ICD-10-CM | POA: Insufficient documentation

## 2018-03-30 DIAGNOSIS — G8929 Other chronic pain: Secondary | ICD-10-CM | POA: Insufficient documentation

## 2018-03-30 DIAGNOSIS — Z87891 Personal history of nicotine dependence: Secondary | ICD-10-CM | POA: Insufficient documentation

## 2018-03-30 DIAGNOSIS — Z803 Family history of malignant neoplasm of breast: Secondary | ICD-10-CM | POA: Insufficient documentation

## 2018-03-30 DIAGNOSIS — M545 Low back pain: Secondary | ICD-10-CM | POA: Insufficient documentation

## 2018-03-30 DIAGNOSIS — Z820 Family history of epilepsy and other diseases of the nervous system: Secondary | ICD-10-CM | POA: Insufficient documentation

## 2018-03-30 NOTE — Patient Instructions (Signed)
Irritable Bowel Syndrome, Adult Irritable bowel syndrome (IBS) is not one specific disease. It is a group of symptoms that affects the organs responsible for digestion (gastrointestinal or GI tract). To regulate how your GI tract works, your body sends signals back and forth between your intestines and your brain. If you have IBS, there may be a problem with these signals. As a result, your GI tract does not function normally. Your intestines may become more sensitive and overreact to certain things. This is especially true when you eat certain foods or when you are under stress. There are four types of IBS. These may be determined based on the consistency of your stool:  IBS with diarrhea.  IBS with constipation.  Mixed IBS.  Unsubtyped IBS.  It is important to know which type of IBS you have. Some treatments are more likely to be helpful for certain types of IBS. What are the causes? The exact cause of IBS is not known. What increases the risk? You may have a higher risk of IBS if:  You are a woman.  You are younger than 33 years old.  You have a family history of IBS.  You have mental health problems.  You have had bacterial infection of your GI tract.  What are the signs or symptoms? Symptoms of IBS vary from person to person. The main symptom is abdominal pain or discomfort. Additional symptoms usually include one or more of the following:  Diarrhea, constipation, or both.  Abdominal swelling or bloating.  Feeling full or sick after eating a small or regular-size meal.  Frequent gas.  Mucus in the stool.  A feeling of having more stool left after a bowel movement.  Symptoms tend to come and go. They may be associated with stress, psychiatric conditions, or nothing at all. How is this diagnosed? There is no specific test to diagnose IBS. Your health care provider will make a diagnosis based on a physical exam, medical history, and your symptoms. You may have other  tests to rule out other conditions that may be causing your symptoms. These may include:  Blood tests.  X-rays.  CT scan.  Endoscopy and colonoscopy. This is a test in which your GI tract is viewed with a long, thin, flexible tube.  How is this treated? There is no cure for IBS, but treatment can help relieve symptoms. IBS treatment often includes:  Changes to your diet, such as: ? Eating more fiber. ? Avoiding foods that cause symptoms. ? Drinking more water. ? Eating regular, medium-sized portioned meals.  Medicines. These may include: ? Fiber supplements if you have constipation. ? Medicine to control diarrhea (antidiarrheal medicines). ? Medicine to help control muscle spasms in your GI tract (antispasmodic medicines). ? Medicines to help with any mental health issues, such as antidepressants or tranquilizers.  Therapy. ? Talk therapy may help with anxiety, depression, or other mental health issues that can make IBS symptoms worse.  Stress reduction. ? Managing your stress can help keep symptoms under control.  Follow these instructions at home:  Take medicines only as directed by your health care provider.  Eat a healthy diet. ? Avoid foods and drinks with added sugar. ? Include more whole grains, fruits, and vegetables gradually into your diet. This may be especially helpful if you have IBS with constipation. ? Avoid any foods and drinks that make your symptoms worse. These may include dairy products and caffeinated or carbonated drinks. ? Do not eat large meals. ? Drink enough   fluid to keep your urine clear or pale yellow.  Exercise regularly. Ask your health care provider for recommendations of good activities for you.  Keep all follow-up visits as directed by your health care provider. This is important. Contact a health care provider if:  You have constant pain.  You have trouble or pain with swallowing.  You have worsening diarrhea. Get help right away  if:  You have severe and worsening abdominal pain.  You have diarrhea and: ? You have a rash, stiff neck, or severe headache. ? You are irritable, sleepy, or difficult to awaken. ? You are weak, dizzy, or extremely thirsty.  You have bright red blood in your stool or you have black tarry stools.  You have unusual abdominal swelling that is painful.  You vomit continuously.  You vomit blood (hematemesis).  You have both abdominal pain and a fever. This information is not intended to replace advice given to you by your health care provider. Make sure you discuss any questions you have with your health care provider. Document Released: 11/10/2005 Document Revised: 04/11/2016 Document Reviewed: 07/28/2014 Elsevier Interactive Patient Education  2018 [employer]  
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
## 2018-03-31 ENCOUNTER — Encounter: Payer: Self-pay | Admitting: Neurology

## 2018-04-01 ENCOUNTER — Ambulatory Visit: Payer: Self-pay | Admitting: Family Medicine

## 2018-04-05 ENCOUNTER — Encounter (HOSPITAL_COMMUNITY): Payer: Self-pay | Admitting: *Deleted

## 2018-04-06 ENCOUNTER — Ambulatory Visit: Payer: Self-pay | Attending: Family Medicine

## 2018-04-06 ENCOUNTER — Encounter

## 2018-04-06 ENCOUNTER — Encounter: Payer: Self-pay | Admitting: Internal Medicine

## 2018-04-06 ENCOUNTER — Ambulatory Visit (INDEPENDENT_AMBULATORY_CARE_PROVIDER_SITE_OTHER): Payer: Self-pay | Admitting: Internal Medicine

## 2018-04-06 VITALS — BP 102/62 | HR 62 | Ht 70.0 in | Wt 149.0 lb

## 2018-04-06 DIAGNOSIS — R109 Unspecified abdominal pain: Secondary | ICD-10-CM

## 2018-04-06 DIAGNOSIS — K219 Gastro-esophageal reflux disease without esophagitis: Secondary | ICD-10-CM

## 2018-04-06 DIAGNOSIS — K589 Irritable bowel syndrome without diarrhea: Secondary | ICD-10-CM

## 2018-04-06 MED ORDER — LINACLOTIDE 145 MCG PO CAPS: 145.0000 ug | ORAL_CAPSULE | Freq: Every day | ORAL | 0 refills | Status: DC

## 2018-04-06 MED ORDER — OMEPRAZOLE 40 MG PO CPDR: 40.0000 mg | DELAYED_RELEASE_CAPSULE | Freq: Every day | ORAL | 3 refills | Status: DC
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
## 2018-04-06 NOTE — Patient Instructions (Addendum)
We have sent the following medications to your pharmacy for you to pick up at your convenience:  Omeprazole  You have been given samples of Linzess 145 mcg.  Take one 30 minutes before your first meal of the day.  If this works well for you, call the office and we will send in a prescription.  Please follow up as needed

## 2018-04-14 ENCOUNTER — Ambulatory Visit
Admission: RE | Admit: 2018-04-14 | Discharge: 2018-04-14 | Disposition: A | Discharge status: Home / Self Care | Payer: Self-pay | Source: Ambulatory Visit | Attending: Neurology | Admitting: Neurology

## 2018-04-14 DIAGNOSIS — M5416 Radiculopathy, lumbar region: Secondary | ICD-10-CM

## 2018-04-14 DIAGNOSIS — M545 Low back pain, unspecified: Secondary | ICD-10-CM

## 2018-04-14 DIAGNOSIS — R202 Paresthesia of skin: Principal | ICD-10-CM

## 2018-04-14 DIAGNOSIS — R200 Anesthesia of skin: Secondary | ICD-10-CM

## 2018-04-14 MED ORDER — GADOBENATE DIMEGLUMINE 529 MG/ML IV SOLN
14.0000 mL | Freq: Once | INTRAVENOUS | Status: AC | PRN
  Administered 2018-04-14: 14 mL via INTRAVENOUS

## 2018-04-15 ENCOUNTER — Telehealth: Payer: Self-pay

## 2018-04-15 NOTE — Telephone Encounter (Signed)
Spoke with pt relaying message below.  She states that she is still in a great deal of pain and spends most of her days in bed.  She is requesting a referral to rheumatology.  Advised I would send message to Dr. Delice Lesch and will let pt know about referral.

## 2018-04-15 NOTE — Telephone Encounter (Signed)
-----   Message from Cameron Sprang, MD sent at 04/15/2018 12:04 PM EDT ----- Pls let her know the MRI of her lower back is normal, nothing compressing on the spinal cord as well as other organs in this region. Thanks

## 2018-04-16 ENCOUNTER — Other Ambulatory Visit: Payer: Self-pay

## 2018-04-16 ENCOUNTER — Ambulatory Visit: Payer: Self-pay | Admitting: Physical Medicine & Rehabilitation

## 2018-04-16 DIAGNOSIS — M5412 Radiculopathy, cervical region: Secondary | ICD-10-CM

## 2018-04-16 DIAGNOSIS — M545 Low back pain, unspecified: Secondary | ICD-10-CM

## 2018-04-16 DIAGNOSIS — M5416 Radiculopathy, lumbar region: Secondary | ICD-10-CM

## 2018-04-16 NOTE — Telephone Encounter (Signed)
Referral declined per Dr. Estanislado Pandy

## 2018-04-16 NOTE — Telephone Encounter (Signed)
Ok for referral, thanks

## 2018-04-16 NOTE — Telephone Encounter (Signed)
Referral placed.

## 2018-04-20 ENCOUNTER — Telehealth: Payer: Self-pay | Admitting: Neurology

## 2018-04-20 ENCOUNTER — Telehealth: Payer: Self-pay | Admitting: Internal Medicine

## 2018-04-20 NOTE — Telephone Encounter (Signed)
Spoke with [name] who states her neurologist would like for her to f/up with Dr Caryl Comes regarding her migraines and syncope. [name] would like to see Dr Caryl Comes sooner than July. I advised her I would forward her request to scheduling to see where we could fit her in.

## 2018-04-20 NOTE — Telephone Encounter (Signed)
Pt called and said she has had headaches for the last 5 days and they are not getting any better, pt also wants to be referred to a rheumatologist and pt stated if Dr Delice Lesch can not do this to please note that in pt's chart and pt wants a call back regarding this

## 2018-04-20 NOTE — Procedures (Signed)
ELECTROENCEPHALOGRAM REPORT  Dates of Recording: 03/08/2018 10:33AM to 03/10/2018 10:36AM  Patient's Name: [name] MRN: [id number] Date of Birth: 06-13-1986  Referring Provider: Dr. Ellouise Newer  Procedure: 48-hour ambulatory EEG  History: This is a 32 year old woman with recurrent syncope, staring spells, recurrent right arm weakness  Medications: Neurontin, Naproxen, Zantac  Technical Summary: This is a 48-hour multichannel digital EEG recording measured by the international 10-20 system with electrodes applied with paste and impedances below 5000 ohms performed as portable with EKG monitoring.  The digital EEG was referentially recorded, reformatted, and digitally filtered in a variety of bipolar and referential montages for optimal display.    DESCRIPTION OF RECORDING: During maximal wakefulness, the background activity consisted of a symmetric 9 Hz posterior dominant rhythm which was reactive to eye opening.  There were no epileptiform discharges or focal slowing seen in wakefulness.  During the recording, the patient progresses through wakefulness, drowsiness, and Stage 2 sleep. During drowsiness and sleep, sharply contoured wave forms over the bilateral temporal regions, left greater than right were seen, consistent with wicket spikes, a normal variant with no pathologic significance. Again, there were no clear epileptiform discharges seen.  Events: On 04/15 at 1215 hours, she reports heart pounding, short of breath. Electrographically, there were no EEG or EKG changes seen. EKG showed sinus rhythm at 84bpm  On 04/15 at 1801 hours, she reports nausea and dizziness. Electrographically, there were no EEG or EKG changes seen.  There were no electrographic seizures seen.  EKG lead was overall unremarkable, there were occasional periods of sinus tachycardia up to 114bpm during the study.  IMPRESSION: This 48-hour ambulatory EEG study is within normal limits.  CLINICAL  CORRELATION: A normal EEG does not exclude a clinical diagnosis of epilepsy. Typical events were not captured. Episodes of dizziness, nausea, palpitations, shortness of breath, did not show any EEG correlate. If further clinical questions remain, inpatient video EEG monitoring may be helpful.   Ellouise Newer, M.D.

## 2018-04-20 NOTE — Telephone Encounter (Signed)
New message    Pt c/o Syncope: STAT if syncope occurred within 30 minutes and pt complains of lightheadedness High Priority if episode of passing out, completely, today or in last 24 hours   1. Did you pass out today? NO  2. When is the last time you passed out? Saturday  3. Has this occurred multiple times? No  Did you have any symptoms prior to passing out? Patient states she was using the bathroom and passed out. Patient states she often has palpitations

## 2018-04-23 ENCOUNTER — Encounter: Payer: Self-pay | Admitting: Physical Medicine & Rehabilitation

## 2018-04-23 ENCOUNTER — Telehealth: Payer: Self-pay

## 2018-04-23 NOTE — Telephone Encounter (Signed)
JA 

## 2018-04-26 ENCOUNTER — Other Ambulatory Visit: Payer: Self-pay | Admitting: Family Medicine

## 2018-04-26 NOTE — Telephone Encounter (Signed)
Letter is ready for pick-up.

## 2018-04-27 NOTE — Telephone Encounter (Signed)
Patient picked up letter on 04/27/18

## 2018-04-28 ENCOUNTER — Ambulatory Visit: Payer: Self-pay | Attending: Family Medicine | Admitting: Physician Assistant

## 2018-04-28 VITALS — BP 98/63 | HR 134 | Temp 98.5°F | Resp 18 | Ht 70.0 in | Wt 147.0 lb

## 2018-04-28 DIAGNOSIS — M797 Fibromyalgia: Secondary | ICD-10-CM | POA: Insufficient documentation

## 2018-04-28 DIAGNOSIS — I10 Essential (primary) hypertension: Secondary | ICD-10-CM | POA: Insufficient documentation

## 2018-04-28 DIAGNOSIS — Z886 Allergy status to analgesic agent status: Secondary | ICD-10-CM | POA: Insufficient documentation

## 2018-04-28 DIAGNOSIS — Z79899 Other long term (current) drug therapy: Secondary | ICD-10-CM | POA: Insufficient documentation

## 2018-04-28 DIAGNOSIS — R102 Pelvic and perineal pain: Secondary | ICD-10-CM | POA: Insufficient documentation

## 2018-04-28 DIAGNOSIS — F4310 Post-traumatic stress disorder, unspecified: Secondary | ICD-10-CM | POA: Insufficient documentation

## 2018-04-28 DIAGNOSIS — K219 Gastro-esophageal reflux disease without esophagitis: Secondary | ICD-10-CM | POA: Insufficient documentation

## 2018-04-28 DIAGNOSIS — N926 Irregular menstruation, unspecified: Secondary | ICD-10-CM | POA: Insufficient documentation

## 2018-04-28 DIAGNOSIS — F330 Major depressive disorder, recurrent, mild: Secondary | ICD-10-CM | POA: Insufficient documentation

## 2018-04-28 LAB — POCT URINALYSIS DIPSTICK
Bilirubin, UA: NEGATIVE
Glucose, UA: NEGATIVE
Ketones, UA: NEGATIVE
Leukocytes, UA: NEGATIVE
Nitrite, UA: NEGATIVE
Protein, UA: NEGATIVE
Spec Grav, UA: 1.01 (ref 1.010–1.025)
Urobilinogen, UA: 0.2 E.U./dL
pH, UA: 6.5 (ref 5.0–8.0)

## 2018-04-28 LAB — POCT URINE PREGNANCY: Preg Test, Ur: NEGATIVE

## 2018-04-28 NOTE — Progress Notes (Signed)
Patient ID: [name], female   DOB: March 13, 1986, 32 y.o.   MRN: [id number]     [name], is a 32 y.o. female  IRJ:[id number]  [id number]  DOB - 04-15-1986  Subjective:  Chief Complaint and HPI: [name] is a 32 y.o. female here for [place] for about 1 month-light some days, some spotting, and some days heavy.  For the last 2 weeks bleeding through pads.  She is using 2 pads at a time and has to change pads twice an hour for 2 weeks.  She is feeling weak and very tired.  No dysuria.  Period cramping is very bad.  Periods usually regular.  Not sexually active.    ROS:   Constitutional:  No f/c, No night sweats, No unexplained weight loss. EENT:  No vision changes, No blurry vision, No hearing changes. No mouth, throat, or ear problems.  Respiratory: No cough, No SOB Cardiac: No CP, no palpitations GI:  No abd pain, No N/V/D. GU: No Urinary s/sx Musculoskeletal: No joint pain Neuro: No headache, no dizziness, no motor weakness.  Skin: No rash Endocrine:  No polydipsia. No polyuria.  Psych: Denies SI/HI  No problems updated.  ALLERGIES: Allergies  Allergen Reactions  . Aspirin Hives    PAST MEDICAL HISTORY: Past Medical History:  Diagnosis Date  . Allergic rhinitis 01/24/2015  . Arrhythmia Dx 2015  . Back pain   . Breast mass   . Common migraine 04/15/2017  . Depression Dx 2000  . Fibromyalgia   . GERD (gastroesophageal reflux disease) 03/13/2015  . Headache    migraines  . Heart murmur Dx 2015  . 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 Hypertension Dx 57[address]  . Lumbar radicular pain 11/22/2014  . Lumbar radiculopathy   . Pain disorder 08/29/2017  . Panic attack Dx 2006  . Positive depression screening 08/29/2017  . POTS (postural orthostatic tachycardia syndrome) 03/24/2014  . PTSD (post-traumatic stress disorder) Dx 2013  . Seizures ([place]) 2001  . Vitamin D deficiency 01/24/2015    MEDICATIONS AT HOME: Prior to Admission  medications   Medication Sig Start Date End Date Taking? Authorizing Provider  acetaminophen (TYLENOL) 500 MG tablet Take 2 tablets (1,000 mg total) by mouth every 8 (eight) hours as needed. 11/06/17   Alfonse Spruce, FNP  dicyclomine (BENTYL) 20 MG tablet Take 1 tablet (20 mg total) by mouth 2 (two) times daily as needed for spasms. 01/15/18   Hayden Rasmussen, MD  gabapentin (NEURONTIN) 300 MG capsule Take 1 cap in AM, 2 caps in PM 03/23/18   Cameron Sprang, MD  linaclotide [facility]) 145 MCG CAPS capsule Take 1 capsule (145 mcg total) by mouth daily before breakfast. 04/06/18   [name] Shipper, MD  methocarbamol (ROBAXIN) 500 MG tablet Take 1 tablet (500 mg total) by mouth every 8 (eight) hours as needed. 01/01/18   [name], MD  metoprolol succinate (TOPROL-XL) 100 MG 24 hr tablet Take 1 tablet (100 mg total) by mouth daily. 01/01/18   [name], MD  omeprazole (PRILOSEC) 40 MG capsule Take 1 capsule (40 mg total) by mouth daily. 04/06/18   [name] Shipper, MD  ondansetron (ZOFRAN) 4 MG tablet Take 1 tablet (4 mg total) by mouth every 6 (six) hours. 01/15/18   Hayden Rasmussen, MD  promethazine (PHENERGAN) 25 MG suppository Place 1 suppository (25 mg total) rectally every 8 (eight) hours as needed for nausea or vomiting. 01/18/18   [place],  Enobong, MD  ranitidine (ZANTAC) 75 MG tablet Take 75 mg by mouth as needed for heartburn.    [provider]     Objective:  EXAM:   Vitals:   04/28/18 1430  BP: 98/63  Pulse: (!) 134  Resp: 18  Temp: 98.5 F (36.9 C)  TempSrc: Oral  SpO2: 99%  Weight: 147 lb (66.7 kg)  Height: 5\' 10"  (1.778 m)    General appearance : A&OX3. NAD. Non-toxic-appearing HEENT: Atraumatic and Normocephalic.  PERRLA. EOM intact.  Neck: supple, no JVD. No cervical lymphadenopathy. No thyromegaly Chest/Lungs:  Breathing-non-labored, Good air entry bilaterally, breath sounds normal without rales, rhonchi, or wheezing  CVS: S1 S2 regular, no murmurs,  gallops, rubs (rate at 104 on exam) Abdomen: Bowel sounds present, Non tender and not distended with no gaurding, rigidity or rebound. Extremities: Bilateral Lower Ext shows no edema, both legs are warm to touch with = pulse throughout Neurology:  CN II-XII grossly intact, Non focal.   Psych:  TP linear. J/I WNL. Normal speech. Appropriate eye contact and affect.  Skin:  No Rash  Data Review No results found for: HGBA1C   Assessment & Plan   1. Pelvic pain With menorrhagia that is severe by history.  [facility] due to excess menstrual bleeding and tachycardia.  Patient stable to go by car - Urinalysis Dipstick - Urine cytology ancillary only - POCT urine pregnancy  2. Irregular periods With severe menorrhagia-to [facility] ED - TSH - POCT urine pregnancy - CBC with Differential/Platelet  3. Mild episode of recurrent major depressive disorder (HCC) Stable-check vitamin D  4. Muscle pain, fibromyalgia - Vitamin D, 25-hydroxy - TSH  Patient have been counseled extensively about nutrition and exercise  Return for keep 7/2 appt with Dr Margarita Rana.  The patient was given clear instructions to go to ER or return to medical center if symptoms don't improve, worsen or new problems develop. The patient verbalized understanding. The patient was told to call to get lab results if they haven't heard anything in the next week.     Freeman Caldron, PA-C [facility], [place]   04/28/2018, 2:32 PM

## 2018-04-28 NOTE — Patient Instructions (Signed)
To [facility] hospital ED for evaluation due to heavy bleeding.

## 2018-04-29 ENCOUNTER — Telehealth: Payer: Self-pay | Admitting: *Deleted

## 2018-04-29 ENCOUNTER — Other Ambulatory Visit: Payer: Self-pay | Admitting: Physician Assistant

## 2018-04-29 LAB — CBC WITH DIFFERENTIAL/PLATELET
Basophils Absolute: 0 10*3/uL (ref 0.0–0.2)
Basos: 0 %
EOS (ABSOLUTE): 0 10*3/uL (ref 0.0–0.4)
Eos: 1 %
Hematocrit: 31.5 % — ABNORMAL LOW (ref 34.0–46.6)
Hemoglobin: 10.8 g/dL — ABNORMAL LOW (ref 11.1–15.9)
Immature Grans (Abs): 0 10*3/uL (ref 0.0–0.1)
Immature Granulocytes: 0 %
Lymphocytes Absolute: 2 10*3/uL (ref 0.7–3.1)
Lymphs: 36 %
MCH: 29.9 pg (ref 26.6–33.0)
MCHC: 34.3 g/dL (ref 31.5–35.7)
MCV: 87 fL (ref 79–97)
Monocytes Absolute: 0.3 10*3/uL (ref 0.1–0.9)
Monocytes: 6 %
Neutrophils Absolute: 3.2 10*3/uL (ref 1.4–7.0)
Neutrophils: 57 %
Platelets: 278 10*3/uL (ref 150–450)
RBC: 3.61 x10E6/uL — ABNORMAL LOW (ref 3.77–5.28)
RDW: 14.3 % (ref 12.3–15.4)
WBC: 5.5 10*3/uL (ref 3.4–10.8)

## 2018-04-29 LAB — URINE CYTOLOGY ANCILLARY ONLY
Chlamydia: NEGATIVE
Neisseria Gonorrhea: NEGATIVE
Trichomonas: NEGATIVE

## 2018-04-29 LAB — TSH: TSH: 1.12 u[IU]/mL (ref 0.450–4.500)

## 2018-04-29 LAB — VITAMIN D 25 HYDROXY (VIT D DEFICIENCY, FRACTURES): Vit D, 25-Hydroxy: 10 ng/mL — ABNORMAL LOW (ref 30.0–100.0)

## 2018-04-29 MED ORDER — FERROUS SULFATE 325 (65 FE) MG PO TABS: 325.0000 mg | ORAL_TABLET | Freq: Every day | ORAL | 3 refills | Status: DC

## 2018-04-29 MED ORDER — VITAMIN D (ERGOCALCIFEROL) 1.25 MG (50000 UNIT) PO CAPS: 50000.0000 [IU] | ORAL_CAPSULE | ORAL | 0 refills | Status: DC

## 2018-04-29 MED FILL — VIT D2 1.25 MG (50,000 UNIT: 1.25 MG | 28 days supply | Qty: 4 | Fill #0

## 2018-04-29 MED FILL — FERROUS SULFATE 325 MG TAB: 325 (65 FE) | 30 days supply | Qty: 30 | Fill #0

## 2018-04-29 NOTE — Telephone Encounter (Signed)
Patient verified DOB Patient is aware of vitamin d and hemoglobin being low which blow contribute to fatigue, depression. Patient aware of script being placed at [facility]. Patient did not report to women's and complete and [place] as advised from visit on yesterday. Patient did not acknowledge or share explanation as to why she did not report to the ED. No further questions.

## 2018-04-29 NOTE — Telephone Encounter (Signed)
-----   Message from [place] Donovan, [place] sent at 04/29/2018  8:07 AM EDT ----- Please call patient and let them that their vitamin D is VERY low.  This can contribute to muscle aches, anxiety, fatigue, and depression.  I have sent a prescription to the pharmacy for them to take once a week.  Her hemoglobin is also low-likely due to the bleeding. This will also cause fatigue.  I sent a prescription of iron to take daily to the pharmacy.  We will recheck this level in 3-4 months.   Thanks, Freeman Caldron, PA-C

## 2018-04-30 ENCOUNTER — Encounter (HOSPITAL_COMMUNITY): Payer: Self-pay | Admitting: *Deleted

## 2018-04-30 ENCOUNTER — Inpatient Hospital Stay (HOSPITAL_COMMUNITY)
Admission: AD | Admit: 2018-04-30 | Discharge: 2018-04-30 | Disposition: A | Discharge status: Home / Self Care | Payer: Self-pay | Source: Ambulatory Visit | Attending: Obstetrics & Gynecology | Admitting: Obstetrics & Gynecology

## 2018-04-30 DIAGNOSIS — Z87891 Personal history of nicotine dependence: Secondary | ICD-10-CM | POA: Insufficient documentation

## 2018-04-30 DIAGNOSIS — N939 Abnormal uterine and vaginal bleeding, unspecified: Secondary | ICD-10-CM | POA: Insufficient documentation

## 2018-04-30 DIAGNOSIS — N921 Excessive and frequent menstruation with irregular cycle: Secondary | ICD-10-CM

## 2018-04-30 LAB — URINALYSIS, ROUTINE W REFLEX MICROSCOPIC
Bacteria, UA: NONE SEEN
Bilirubin Urine: NEGATIVE
Glucose, UA: NEGATIVE mg/dL
Ketones, ur: NEGATIVE mg/dL
Leukocytes, UA: NEGATIVE
Nitrite: NEGATIVE
Protein, ur: NEGATIVE mg/dL
Specific Gravity, Urine: 1.001 — ABNORMAL LOW (ref 1.005–1.030)
pH: 6 (ref 5.0–8.0)

## 2018-04-30 LAB — CBC
HCT: 33.2 % — ABNORMAL LOW (ref 36.0–46.0)
Hemoglobin: 11.7 g/dL — ABNORMAL LOW (ref 12.0–15.0)
MCH: 30.6 pg (ref 26.0–34.0)
MCHC: 35.2 g/dL (ref 30.0–36.0)
MCV: 86.9 fL (ref 78.0–100.0)
Platelets: 304 10*3/uL (ref 150–400)
RBC: 3.82 MIL/uL — ABNORMAL LOW (ref 3.87–5.11)
RDW: 13.5 % (ref 11.5–15.5)
WBC: 5 10*3/uL (ref 4.0–10.5)

## 2018-04-30 LAB — POCT PREGNANCY, URINE: Preg Test, Ur: NEGATIVE

## 2018-04-30 MED ORDER — ACETAMINOPHEN 500 MG PO TABS
1000.0000 mg | ORAL_TABLET | Freq: Once | ORAL | Status: AC
Start: 2018-04-30 — End: 2018-04-30
  Administered 2018-04-30: 1000 mg via ORAL
  Filled 2018-04-30: qty 2

## 2018-04-30 MED ORDER — MEGESTROL ACETATE 40 MG PO TABS: 40.0000 mg | ORAL_TABLET | Freq: Three times a day (TID) | ORAL | 0 refills | Status: AC

## 2018-04-30 NOTE — Discharge Instructions (Signed)
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
## 2018-04-30 NOTE — MAU Note (Signed)
Pt states she was in PCP's office a few days ago because she has been bleeding for a month. Some times spotting but for the last 2 weeks it has been really heavy. She is now passing clots. Has been having pelvic pain since the time the bleeding started but the pain is worsening.

## 2018-05-02 LAB — URINE CYTOLOGY ANCILLARY ONLY
Bacterial vaginitis: NEGATIVE
Candida vaginitis: NEGATIVE

## 2018-05-05 ENCOUNTER — Encounter: Payer: Self-pay | Admitting: Internal Medicine

## 2018-05-05 ENCOUNTER — Ambulatory Visit (INDEPENDENT_AMBULATORY_CARE_PROVIDER_SITE_OTHER): Payer: No Typology Code available for payment source | Admitting: Internal Medicine

## 2018-05-05 ENCOUNTER — Encounter

## 2018-05-05 VITALS — BP 122/68 | HR 86 | Ht 70.0 in | Wt 148.0 lb

## 2018-05-05 DIAGNOSIS — I498 Other specified cardiac arrhythmias: Secondary | ICD-10-CM

## 2018-05-05 DIAGNOSIS — G90A Postural orthostatic tachycardia syndrome (POTS): Secondary | ICD-10-CM

## 2018-05-05 DIAGNOSIS — G909 Disorder of the autonomic nervous system, unspecified: Secondary | ICD-10-CM

## 2018-05-05 DIAGNOSIS — I951 Orthostatic hypotension: Secondary | ICD-10-CM

## 2018-05-05 DIAGNOSIS — R000 Tachycardia, unspecified: Secondary | ICD-10-CM

## 2018-05-05 NOTE — Patient Instructions (Signed)

## 2018-05-05 NOTE — Progress Notes (Signed)
Patient Care Team: [name], MD as PCP - General (Family Medicine)   HPI  [name] is a 32 y.o. female Seen in followup for palpitations that I thought was possibly dysautonomic;  the event recorder supported diagnosis  Showing recurrent sinus tachycardia. She also been noted to have right bundle branch block/RAD ventricular ectopy She has a history of syncope  She struggles with PTSD and anxiety. 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 She is raising her 2 sons of her older sister.  There was a family of "cardiac arrest", however, these occurred in the context of cancer and dialysis.  She continues to significant problems with pain and fatigue. She also has intermittent tachycardia palpitations with exertion.   With elevated blood pressure and sinus tach we tried on betablockers which she stopped.  Marland Kitchen  She was taken by EMS who picked her up from work and took to [employer] and she says she was not seen  careeverywhere identficied no records there  Records from [place] showed normal  VS  We have just collected and the information from [facility]. Vital signs on arrival demonstrated a blood pressure 150/96 with a pulse of 80. This is in stark contrast was taken to the patient's saying that her heart rate was over 250. In addition the EMS narrative does  not describe attendance for syncope but rather for chest pain.  She was seen 3 months ago she expressed passive suicidality.  We will need to get her to the emergency room but she left the office patients. In the interim she has been seen by GI with an assessment of "pyrosis" and acknowledgment of negative imaging studies She has been seen by physical rehabilitation medication where she was diagnosed with fibromyalgia.  She has been seen by neurology for intractable headaches and syncope.  Possibility of psychogenic nonepileptic events was raised.  She thinks she needs a rheumatology referral  Past  Medical History:  Diagnosis Date  . Allergic rhinitis 01/24/2015  . Arrhythmia Dx 2015  . Back pain   . Breast mass   . Common migraine 04/15/2017  . Depression Dx 2000  . Fibromyalgia   . GERD (gastroesophageal reflux disease) 03/13/2015  . Headache    migraines  . Heart murmur Dx 2015  . 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 [address]  . Lumbar radicular pain 11/22/2014  . Lumbar radiculopathy   . Pain disorder 08/29/2017  . Panic attack Dx 2006  . Positive depression screening 08/29/2017  . POTS (postural orthostatic tachycardia syndrome) 03/24/2014  . PTSD (post-traumatic stress disorder) Dx 2013  . Seizures ([place]) 2001  . Vitamin D deficiency 01/24/2015    Past Surgical History:  Procedure Laterality Date  . ESOPHAGOGASTRODUODENOSCOPY N/A 08/14/2015   Procedure: ESOPHAGOGASTRODUODENOSCOPY (EGD);  Surgeon: Carol Ada, MD;  Location: [facility];  Service: Endoscopy;  Laterality: N/A;    Current Outpatient Medications  Medication Sig Dispense Refill  . acetaminophen (TYLENOL) 500 MG tablet Take 2 tablets (1,000 mg total) by mouth every 8 (eight) hours as needed. 40 tablet 0  . dicyclomine (BENTYL) 20 MG tablet Take 1 tablet (20 mg total) by mouth 2 (two) times daily as needed for spasms. 20 tablet 0  . ferrous sulfate 325 (65 FE) MG tablet Take 1 tablet (325 mg total) by mouth daily with breakfast. 90 tablet 3  . gabapentin (NEURONTIN) 300 MG capsule Take 1 cap in AM, 2 caps  in PM 90 capsule 6  . linaclotide (LINZESS) 145 MCG CAPS capsule Take 1 capsule (145 mcg total) by mouth daily before breakfast. 30 capsule 0  . megestrol (MEGACE) 40 MG tablet Take 1 tablet (40 mg total) by mouth 3 (three) times daily. 90 tablet 0  . methocarbamol (ROBAXIN) 500 MG tablet Take 1 tablet (500 mg total) by mouth every 8 (eight) hours as needed. 60 tablet 2  . metoprolol succinate (TOPROL-XL) 100 MG 24 hr tablet Take 1 tablet (100 mg total) by mouth daily. 30 tablet 3  .  omeprazole (PRILOSEC) 40 MG capsule Take 1 capsule (40 mg total) by mouth daily. 90 capsule 3  . ondansetron (ZOFRAN) 4 MG tablet Take 1 tablet (4 mg total) by mouth every 6 (six) hours. 12 tablet 0  . promethazine (PHENERGAN) 25 MG suppository Place 1 suppository (25 mg total) rectally every 8 (eight) hours as needed for nausea or vomiting. 20 each 0  . ranitidine (ZANTAC) 75 MG tablet Take 75 mg by mouth as needed for heartburn.    . Vitamin D, Ergocalciferol, (DRISDOL) 50000 units CAPS capsule Take 1 capsule (50,000 Units total) by mouth every 7 (seven) days. 16 capsule 0   No current facility-administered medications for this visit.     Allergies  Allergen Reactions  . Aspirin Hives    Review of Systems negative except from HPI and PMH  Physical Exam BP 122/68   Pulse 86   Ht _0  (1.778 m)   Wt 148 lb (67.1 kg)   LMP 04/28/2018 Comment: a full month with quarter size clots  SpO2 98%   BMI 21.24 kg/m  Well developed and nourished in no acute distress HENT normal Neck supple with JVP-flat Clear Regular rate and rhythm, no murmurs or gallops Abd-soft with active BS No Clubbing cyanosis edema Skin-warm and dry A & Oriented  Grossly normal sensory and motor function  Sinus 86  18/07/37   Assessment and  Plan  Ventricular ectopy-right bundle branch/RAD  Dysautonomia/orthostatic intolerance   Chest pain-atypical   Anxiety/PTSD  Elevated Blood pressure    We discussed again the importance of the psychological/psychiatirc potential contributions given the significant stresses which she has outlined  BP stable    No tachycardia at present   She has never met criteria for POTS    She asked questions about her headache and POTS and syncope, and prior to my returning to review, she left " and you can keep it."  More than 50% of 40 min was spent in counseling related to the above

## 2018-05-17 ENCOUNTER — Ambulatory Visit: Payer: No Typology Code available for payment source | Admitting: Internal Medicine

## 2018-05-24 ENCOUNTER — Other Ambulatory Visit: Payer: Self-pay

## 2018-05-24 ENCOUNTER — Ambulatory Visit
Payer: No Typology Code available for payment source | Attending: Physical Medicine & Rehabilitation | Admitting: Physical Therapy

## 2018-05-24 DIAGNOSIS — R252 Cramp and spasm: Secondary | ICD-10-CM | POA: Insufficient documentation

## 2018-05-24 DIAGNOSIS — M545 Low back pain: Secondary | ICD-10-CM | POA: Insufficient documentation

## 2018-05-24 DIAGNOSIS — M542 Cervicalgia: Secondary | ICD-10-CM | POA: Insufficient documentation

## 2018-05-24 DIAGNOSIS — G8929 Other chronic pain: Secondary | ICD-10-CM | POA: Insufficient documentation

## 2018-05-24 NOTE — Patient Instructions (Signed)
Scapular Retraction (Standing)   With arms at sides, pinch shoulder blades together. Repeat 10 times per set. Do 1-3 sets per session. Do 2 sessions per day.  http://orth.exer.us/944     Flexibility: Neck Retraction   Pull head straight back, keeping eyes and jaw level. Hold 3-5 seconds. Repeat _10 times per set. Do 3-5  sessions per day.  http://orth.exer.us/344   Posture - Sitting   Sit upright, head facing forward. Try using a roll to support lower back. Keep shoulders relaxed, and avoid rounded back. Keep hips level with knees. Avoid crossing legs for long periods.    Lower Trunk Rotation Stretch   Keeping back flat and feet together, rotate knees to left side. Hold __10__ seconds. Repeat __5__ times per set. Do ____ sets per session. Do _2_ sessions per day.   [name], PT 05/24/18 11:17 AM
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
## 2018-05-25 ENCOUNTER — Encounter: Payer: Self-pay | Admitting: Internal Medicine

## 2018-05-25 ENCOUNTER — Ambulatory Visit: Payer: Self-pay | Attending: Family Medicine | Admitting: Internal Medicine

## 2018-05-25 ENCOUNTER — Ambulatory Visit: Payer: No Typology Code available for payment source | Admitting: Physical Therapy

## 2018-05-25 VITALS — BP 127/74 | HR 73 | Temp 97.9°F

## 2018-05-25 DIAGNOSIS — Z886 Allergy status to analgesic agent status: Secondary | ICD-10-CM | POA: Insufficient documentation

## 2018-05-25 DIAGNOSIS — Z79899 Other long term (current) drug therapy: Secondary | ICD-10-CM | POA: Insufficient documentation

## 2018-05-25 DIAGNOSIS — Z87891 Personal history of nicotine dependence: Secondary | ICD-10-CM | POA: Insufficient documentation

## 2018-05-25 DIAGNOSIS — M797 Fibromyalgia: Secondary | ICD-10-CM | POA: Insufficient documentation

## 2018-05-25 DIAGNOSIS — M545 Low back pain, unspecified: Secondary | ICD-10-CM

## 2018-05-25 DIAGNOSIS — F4310 Post-traumatic stress disorder, unspecified: Secondary | ICD-10-CM | POA: Insufficient documentation

## 2018-05-25 DIAGNOSIS — M542 Cervicalgia: Secondary | ICD-10-CM

## 2018-05-25 DIAGNOSIS — N920 Excessive and frequent menstruation with regular cycle: Secondary | ICD-10-CM | POA: Insufficient documentation

## 2018-05-25 DIAGNOSIS — M5412 Radiculopathy, cervical region: Secondary | ICD-10-CM | POA: Insufficient documentation

## 2018-05-25 DIAGNOSIS — I10 Essential (primary) hypertension: Secondary | ICD-10-CM | POA: Insufficient documentation

## 2018-05-25 DIAGNOSIS — G8929 Other chronic pain: Secondary | ICD-10-CM

## 2018-05-25 DIAGNOSIS — R252 Cramp and spasm: Secondary | ICD-10-CM

## 2018-05-25 DIAGNOSIS — G894 Chronic pain syndrome: Secondary | ICD-10-CM | POA: Insufficient documentation

## 2018-05-25 MED ORDER — DICLOFENAC SODIUM 1 % TD GEL: 2.0000 g | Freq: Four times a day (QID) | TRANSDERMAL | 1 refills | Status: DC

## 2018-05-25 MED ORDER — KETOROLAC TROMETHAMINE 30 MG/ML IJ SOLN
30.0000 mg | Freq: Once | INTRAMUSCULAR | Status: AC
Start: 2018-05-25 — End: 2018-05-25
  Administered 2018-05-25: 30 mg via INTRAMUSCULAR

## 2018-05-25 MED FILL — DICLOFENAC SODIUM 1% GEL: 1 | 12 days supply | Qty: 100 | Fill #0

## 2018-05-25 NOTE — Therapy (Signed)
[facility] [address] [place], [place], [address] Phone: [phone]   Fax:  [phone]  Physical Therapy Treatment  Patient Details  Name: [name] MRN: [id number] Date of Birth: Jan 29, 1986 Referring Provider: Claudette Laws   Encounter Date: 05/25/2018  PT End of Session - 05/25/18 1221    Visit Number  2    Number of Visits  24    Date for PT Re-Evaluation  07/19/18    PT Start Time  1150    PT Stop Time  1238    PT Time Calculation (min)  48 min       Past Medical History:  Diagnosis Date  . Allergic rhinitis 01/24/2015  . Arrhythmia Dx 2015  . Back pain   . Breast mass   . Common migraine 04/15/2017  . Depression Dx 2000  . Fibromyalgia   . GERD (gastroesophageal reflux disease) 03/13/2015  . Headache    migraines  . Heart murmur Dx 2015  . 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 Dx [address]  . Lumbar radicular pain 11/22/2014  . Lumbar radiculopathy   . Pain disorder 08/29/2017  . Panic attack Dx 2006  . Positive depression screening 08/29/2017  . POTS (postural orthostatic tachycardia syndrome) 03/24/2014  . PTSD (post-traumatic stress disorder) Dx 2013  . Seizures ([facility]) 2001  . Vitamin D deficiency 01/24/2015    Past Surgical History:  Procedure Laterality Date  . ESOPHAGOGASTRODUODENOSCOPY N/A 08/14/2015   Procedure: ESOPHAGOGASTRODUODENOSCOPY (EGD);  Surgeon: Jeani Hawking, MD;  Location: [facility];  Service: Endoscopy;  Laterality: N/A;    There were no vitals filed for this visit.  Subjective Assessment - 05/25/18 1149    Subjective  about an hour of relief with estim. amb in antalgic,fwd flexed and guarded    Currently in Pain?  Yes    Pain Score  8                        [facility] Adult PT Treatment/Exercise - 05/25/18 0001      Exercises   Exercises  Neck;Lumbar      Lumbar Exercises: Aerobic   Nustep  L 3 5 min- rest needed d/t pain      Lumbar Exercises: Seated   Other Seated Lumbar Exercises  pelvic ROM on dyna disc 10 reps      Moist Heat Therapy   Number Minutes Moist Heat  15 Minutes    Moist Heat Location  Cervical;Lumbar Spine      Electrical Stimulation   Electrical Stimulation Location  cerv/lumb    Electrical Stimulation Action  premod    Electrical Stimulation Goals  Pain      Manual Therapy   Manual Therapy  Joint mobilization;Passive ROM    Joint Mobilization  attemoted to check and mob SI bit pt very guarded and pain limited    Passive ROM  attempted LE/trunk ROM but again very limited             PT Education - 05/24/18 1217    Education Details  HEP    Person(s) Educated  Patient    Methods  Explanation;Demonstration;Handout    Comprehension  Verbalized understanding;Returned demonstration       PT Short Term Goals - 05/24/18 1232      PT SHORT TERM GOAL #1   Title  I with initial HEP    Time  4  Period  Weeks    Status  New    Target Date  06/21/18      PT SHORT TERM GOAL #2   Title  Patient to report pain decrease by 25% overall    Time  4    Period  Weeks    Status  New      PT SHORT TERM GOAL #3   Title  Patient to demo improved postural alignement (normal head position, no left lumbar SB, no post pelvic tilt).    Time  4    Period  Weeks    Status  New        PT Long Term Goals - 05/24/18 1236      PT LONG TERM GOAL #1   Title  Patient to report decreased pain overall by 60% or more with ADLS.    Time  8    Period  Weeks    Status  New    Target Date  07/19/18      PT LONG TERM GOAL #2   Title  Patient to demo improved cervical rotation to [facility] to normalize ADLs.    Time  8    Period  Weeks    Status  New      PT LONG TERM GOAL #3   Title  Patient able to tolerate standing and walkiing without the use of back brace.    Time  8    Period  Weeks    Status  New      PT LONG TERM GOAL #4   Title  Patient to report improved function overall by 50% or  more.    Time  8    Period  Weeks    Status  New            Plan - 05/25/18 1221    Clinical Impression Statement  pt with very guarded mvmts and pain limited, while on dyna disc increased RT SI pain - postured mvmt and + 2 to get pt to a room to lye down, pt began to breathe heavy and c/o HA. cued pt to deep breathe  and issued ice pack for neck to cool pt down. pt did calm down and tolerated estim with MH. pt verb has an old TENS at home that needs pads and will bring next session    PT Next Visit Plan  posture, cervical retraction and ROM (passive and active); AAROM for shoulders; lumbar ROM/pelvic tilts; progress to strengtheing as tolerated. Try OP decompression exercises. Estim/heat for pain.       Patient will benefit from skilled therapeutic intervention in order to improve the following deficits and impairments:  Decreased range of motion, Pain, Decreased activity tolerance, Decreased strength  Visit Diagnosis: Cervicalgia  Chronic bilateral low back pain, with sciatica presence unspecified  Cramp and spasm     Problem List Patient Active Problem List   Diagnosis Date Noted  . Uterine fibroid 01/18/2018  . Cervical radiculopathy 01/01/2018  . Pain disorder 08/29/2017  . Positive depression screening 08/29/2017  . Neck pain without injury 08/24/2017  . Chronic fatigue 05/15/2017  . Common migraine 04/15/2017  . Axillary mass, right 04/09/2017  . History of bulimia nervosa 03/13/2015  . GERD (gastroesophageal reflux disease) 03/13/2015  . Pap smear for cervical cancer screening 03/13/2015  . SOB (shortness of breath) 03/13/2015  . H/O vitamin D deficiency 03/13/2015  . Health care maintenance 01/25/2015  . Chronic tension type headache 01/25/2015  . Muscle  pain, fibromyalgia 01/24/2015  . Allergic rhinitis 01/24/2015  . Lumbar radicular pain 11/22/2014  . Thoracic neuralgia 11/22/2014  . Back pain   . Hemoglobin AC 07/03/2014  . POTS (postural orthostatic  tachycardia syndrome) 03/24/2014  . Abdominal pain 06/06/2012  . Chronic pain 06/06/2012  . Depression 06/06/2012  . HEMOPTYSIS UNSPECIFIED 12/14/2009  . BREAST TENDERNESS 11/08/2009  . NIPPLE DISCHARGE 11/08/2009  . MENORRHAGIA 11/08/2009  . DISORDER, BIPOLAR NOS 08/06/2007  . PERSONALITY DISORDER 08/06/2007  . MARIJUANA ABUSE 08/06/2007  . NARCOTIC ABUSE 08/06/2007  . SEIZURE DISORDER 08/06/2007  . SOMATIZATION DISORDER 12/27/2005    Gladiola Madore,ANGIE PTA 05/25/2018, 12:25 PM  [facility] [address] [place], [place], [address] Phone: [phone]   Fax:  [phone]  Name: [name] MRN: [id number] Date of Birth: 05-Jan-1986

## 2018-05-25 NOTE — Progress Notes (Signed)
Patient ID: [name], female    DOB: January 06, 1986  MRN: [id number]  CC: Pain   Subjective: [name] is a 32 y.o. female who presents for UC visit.  Female friend is with her.  PCP is Dr. Margarita Rana. Her concerns today include:  HTN, PTSD, anx/dep, fibroid, menorrhagia, fibromyalgia  Patient presents today complaining of pain in the right lower back that started today during a physical therapy session.  She has fibromyalgia was referred to physical therapy by Dr. Letta Pate.  Initial consult was yesterday.  Today they had her right hip bike for about 5 minutes then she was asked to sit on a ball to do what sounds like some core exercises.  She states that when she turned to her left side she felt significant pain in the right lower back.  She feels everything is tight.  She is here today in a wheelchair.  She also reports that her pain has been chronic and that she has tried everything from massage, water aerobics chiropractor etc. without any improvement.  She also sees a therapist through [facility].  Dr. Letta Pate had changed her gabapentin to 900 mg once a day.  However she states that made her too tired so she went back to taking 300 mg in the morning, 100 mg in the middle of the day and 200 mg at night. Patient Active Problem List   Diagnosis Date Noted  . Uterine fibroid 01/18/2018  . Cervical radiculopathy 01/01/2018  . Pain disorder 08/29/2017  . Positive depression screening 08/29/2017  . Neck pain without injury 08/24/2017  . Chronic fatigue 05/15/2017  . Common migraine 04/15/2017  . Axillary mass, right 04/09/2017  . History of bulimia nervosa 03/13/2015  . GERD (gastroesophageal reflux disease) 03/13/2015  . Pap smear for cervical cancer screening 03/13/2015  . SOB (shortness of breath) 03/13/2015  . H/O vitamin D deficiency 03/13/2015  . Health care maintenance 01/25/2015  . Chronic tension type headache 01/25/2015  . Muscle pain, fibromyalgia 01/24/2015  . Allergic  rhinitis 01/24/2015  . Lumbar radicular pain 11/22/2014  . Thoracic neuralgia 11/22/2014  . Back pain   . Hemoglobin AC 07/03/2014  . POTS (postural orthostatic tachycardia syndrome) 03/24/2014  . Abdominal pain 06/06/2012  . Chronic pain 06/06/2012  . Depression 06/06/2012  . HEMOPTYSIS UNSPECIFIED 12/14/2009  . BREAST TENDERNESS 11/08/2009  . NIPPLE DISCHARGE 11/08/2009  . MENORRHAGIA 11/08/2009  . DISORDER, BIPOLAR NOS 08/06/2007  . PERSONALITY DISORDER 08/06/2007  . MARIJUANA ABUSE 08/06/2007  . NARCOTIC ABUSE 08/06/2007  . SEIZURE DISORDER 08/06/2007  . SOMATIZATION DISORDER 12/27/2005     Current Outpatient Medications on File Prior to Visit  Medication Sig Dispense Refill  . dicyclomine (BENTYL) 20 MG tablet Take 1 tablet (20 mg total) by mouth 2 (two) times daily as needed for spasms. 20 tablet 0  . ferrous sulfate 325 (65 FE) MG tablet Take 1 tablet (325 mg total) by mouth daily with breakfast. 90 tablet 3  . gabapentin (NEURONTIN) 300 MG capsule Take 1 cap in AM, 2 caps in PM 90 capsule 6  . megestrol (MEGACE) 40 MG tablet Take 1 tablet (40 mg total) by mouth 3 (three) times daily. 90 tablet 0  . metoprolol succinate (TOPROL-XL) 100 MG 24 hr tablet Take 1 tablet (100 mg total) by mouth daily. 30 tablet 3  . promethazine (PHENERGAN) 25 MG suppository Place 1 suppository (25 mg total) rectally every 8 (eight) hours as needed for nausea or vomiting. 20 each 0  .  ranitidine (ZANTAC) 75 MG tablet Take 75 mg by mouth as needed for heartburn.    . Vitamin D, Ergocalciferol, (DRISDOL) 50000 units CAPS capsule Take 1 capsule (50,000 Units total) by mouth every 7 (seven) days. 16 capsule 0  . acetaminophen (TYLENOL) 500 MG tablet Take 2 tablets (1,000 mg total) by mouth every 8 (eight) hours as needed. (Patient not taking: Reported on 05/25/2018) 40 tablet 0  . linaclotide (LINZESS) 145 MCG CAPS capsule Take 1 capsule (145 mcg total) by mouth daily before breakfast. (Patient not taking:  Reported on 05/25/2018) 30 capsule 0  . methocarbamol (ROBAXIN) 500 MG tablet Take 1 tablet (500 mg total) by mouth every 8 (eight) hours as needed. (Patient not taking: Reported on 05/25/2018) 60 tablet 2  . omeprazole (PRILOSEC) 40 MG capsule Take 1 capsule (40 mg total) by mouth daily. (Patient not taking: Reported on 05/24/2018) 90 capsule 3  . ondansetron (ZOFRAN) 4 MG tablet Take 1 tablet (4 mg total) by mouth every 6 (six) hours. (Patient not taking: Reported on 05/25/2018) 12 tablet 0   No current facility-administered medications on file prior to visit.     Allergies  Allergen Reactions  . Aspirin Hives    Social History   Socioeconomic History  . Marital status: Single    Spouse name: Not on file  . Number of children: Not on file  . Years of education: 22  . Highest education level: Not on file  Occupational History  . Not on file  Social Needs  . Financial resource strain: Not on file  . Food insecurity:    Worry: Not on file    Inability: Not on file  . Transportation needs:    Medical: Not on file    Non-medical: Not on file  Tobacco Use  . Smoking status: Former Smoker    Last attempt to quit: 06/23/2012    Years since quitting: 5.9  . Smokeless tobacco: Never Used  Substance and Sexual Activity  . Alcohol use: Not Currently    Comment: rarely  . Drug use: No  . Sexual activity: Not Currently    Birth control/protection: None    Comment: last 2010  Lifestyle  . Physical activity:    Days per week: Not on file    Minutes per session: Not on file  . Stress: Not on file  Relationships  . Social connections:    Talks on phone: Not on file    Gets together: Not on file    Attends religious service: Not on file    Active member of club or organization: Not on file    Attends meetings of clubs or organizations: Not on file    Relationship status: Not on file  . Intimate partner violence:    Fear of current or ex partner: Not on file    Emotionally abused: Not  on file    Physically abused: Not on file    Forced sexual activity: Not on file  Other Topics Concern  . 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 Heart attack Mother   . Heart disease Mother   . Sudden death Mother   . Sudden death Sister   . Heart attack Sister   . Sudden death Maternal Grandmother   . Fainting Maternal Grandmother   . Heart attack Maternal Grandmother   . Breast cancer Paternal Grandmother   . Breast cancer Cousin   . Breast cancer Paternal Aunt   . Seizures Maternal Aunt     Past Surgical History:  Procedure Laterality Date  . ESOPHAGOGASTRODUODENOSCOPY N/A 08/14/2015   Procedure: ESOPHAGOGASTRODUODENOSCOPY (EGD);  Surgeon: Carol Ada, MD;  Location: [facility];  Service: Endoscopy;  Laterality: N/A;    ROS: Review of Systems Negative except as stated above. PHYSICAL EXAM: BP 127/74   Pulse 73   Temp 97.9 F (36.6 C) (Oral)   LMP 04/28/2018 Comment: a full month with quarter size clots  SpO2 100%   Physical Exam  General appearance -patient in a wheelchair in NAD.   Mental status -tearful at times when talking about her chronic pain Musculoskeletal -when asked to get on the exam table patient sat on the exam table but reports a lot of discomfort.  She has tenderness that seems exaggerated to light touch in the right lumbar and upper buttock area.  She was unable to sit on the exam table for any extended period for thyroid exam to be done preferring to sit back in the wheelchair.  ASSESSMENT AND PLAN: 1. Right-sided low back pain without sciatica, unspecified chronicity Possible spasm. Toradol shot  and some Voltaren gel to use.Marland Kitchen Recommend use of heating pad. - ketorolac (TORADOL) 30 MG/ML injection 30 mg - diclofenac sodium (VOLTAREN) 1 % GEL; Apply 2 g topically 4 (four) times daily.  Dispense: 100 g; Refill: 1  2. Chronic pain syndrome She apparently is already receiving cognitive behavioral therapy with her therapist at [facility]  I do not have anything further to offer.  She will keep follow-up appointment with her PCP   Patient was given the opportunity to ask questions.  Patient verbalized understanding of the plan and was able to repeat key elements of the plan.   No orders of the defined types were placed in this encounter.    Requested Prescriptions   Signed Prescriptions Disp Refills  . diclofenac sodium (VOLTAREN) 1 % GEL 100 g 1    Sig: Apply 2 g topically 4 (four) times daily.    Return in about 6 weeks (around 07/06/2018) for [place].  Karle Plumber, MD, FACP

## 2018-05-31 ENCOUNTER — Ambulatory Visit: Payer: Self-pay | Admitting: Internal Medicine

## 2018-06-01 ENCOUNTER — Ambulatory Visit: Payer: No Typology Code available for payment source | Admitting: Physical Therapy

## 2018-06-01 ENCOUNTER — Encounter
Payer: No Typology Code available for payment source | Attending: Physical Medicine & Rehabilitation | Admitting: Psychology

## 2018-06-01 DIAGNOSIS — M545 Low back pain: Secondary | ICD-10-CM

## 2018-06-01 DIAGNOSIS — Z87891 Personal history of nicotine dependence: Secondary | ICD-10-CM | POA: Insufficient documentation

## 2018-06-01 DIAGNOSIS — F329 Major depressive disorder, single episode, unspecified: Secondary | ICD-10-CM | POA: Insufficient documentation

## 2018-06-01 DIAGNOSIS — Z820 Family history of epilepsy and other diseases of the nervous system: Secondary | ICD-10-CM | POA: Insufficient documentation

## 2018-06-01 DIAGNOSIS — R569 Unspecified convulsions: Secondary | ICD-10-CM

## 2018-06-01 DIAGNOSIS — K219 Gastro-esophageal reflux disease without esophagitis: Secondary | ICD-10-CM | POA: Insufficient documentation

## 2018-06-01 DIAGNOSIS — F4312 Post-traumatic stress disorder, chronic: Secondary | ICD-10-CM

## 2018-06-01 DIAGNOSIS — Z9889 Other specified postprocedural states: Secondary | ICD-10-CM | POA: Insufficient documentation

## 2018-06-01 DIAGNOSIS — M797 Fibromyalgia: Secondary | ICD-10-CM | POA: Insufficient documentation

## 2018-06-01 DIAGNOSIS — F4310 Post-traumatic stress disorder, unspecified: Secondary | ICD-10-CM | POA: Insufficient documentation

## 2018-06-01 DIAGNOSIS — I10 Essential (primary) hypertension: Secondary | ICD-10-CM | POA: Insufficient documentation

## 2018-06-01 DIAGNOSIS — Z803 Family history of malignant neoplasm of breast: Secondary | ICD-10-CM | POA: Insufficient documentation

## 2018-06-01 DIAGNOSIS — Z8249 Family history of ischemic heart disease and other diseases of the circulatory system: Secondary | ICD-10-CM | POA: Insufficient documentation

## 2018-06-01 DIAGNOSIS — R252 Cramp and spasm: Secondary | ICD-10-CM

## 2018-06-01 DIAGNOSIS — G8929 Other chronic pain: Secondary | ICD-10-CM

## 2018-06-01 DIAGNOSIS — M542 Cervicalgia: Secondary | ICD-10-CM

## 2018-06-01 MED ORDER — SERTRALINE HCL 50 MG PO TABS: 50.0000 mg | ORAL_TABLET | Freq: Every day | ORAL | 1 refills | Status: DC

## 2018-06-01 MED FILL — SERTRALINE HCL 50 MG TABS: 50 | 30 days supply | Qty: 30 | Fill #0

## 2018-06-01 NOTE — Addendum Note (Signed)
Addended by: Charlett Blake on: 06/01/2018 11:44 AM   Modules accepted: Orders

## 2018-06-01 NOTE — Therapy (Signed)
[facility] [address] [place], [place], [address] Phone: [phone]   Fax:  [phone]  Physical Therapy Treatment  Patient Details  Name: [name] MRN: [id number] Date of Birth: 02-Oct-1986 Referring Provider: Claudette Laws   Encounter Date: 06/01/2018  PT End of Session - 06/01/18 1305    Visit Number  3    Number of Visits  24    Date for PT Re-Evaluation  07/19/18    PT Start Time  1302    PT Stop Time  1345    PT Time Calculation (min)  43 min    Activity Tolerance  Patient limited by pain    Behavior During Therapy  [facility] for tasks assessed/performed;Anxious       Past Medical History:  Diagnosis Date  . Allergic rhinitis 01/24/2015  . Arrhythmia Dx 2015  . Back pain   . Breast mass   . Common migraine 04/15/2017  . Depression Dx 2000  . Fibromyalgia   . GERD (gastroesophageal reflux disease) 03/13/2015  . Headache    migraines  . Heart murmur Dx 2015  . 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 Hypertension Dx 10932  . Lumbar radicular pain 11/22/2014  . Lumbar radiculopathy   . Pain disorder 08/29/2017  . Panic attack Dx 2006  . Positive depression screening 08/29/2017  . POTS (postural orthostatic tachycardia syndrome) 03/24/2014  . PTSD (post-traumatic stress disorder) Dx 2013  . Seizures (HCC) 2001  . Vitamin D deficiency 01/24/2015    Past Surgical History:  Procedure Laterality Date  . ESOPHAGOGASTRODUODENOSCOPY N/A 08/14/2015   Procedure: ESOPHAGOGASTRODUODENOSCOPY (EGD);  Surgeon: Jeani Hawking, MD;  Location: [facility];  Service: Endoscopy;  Laterality: N/A;    There were no vitals filed for this visit.  Subjective Assessment - 06/01/18 1306    Subjective  saw md after last appt and had an injection in R hip which helped but pain still there. Today pain in R shoulder blade with any rotation of cpsine. Saw neuropsychologist today.    Pertinent History  depression, h/o a  lot of trauma, seeing psychiatrist, Pots heart condition, VT tacchycardia (patient passes out) as recent as one week ago, PTSD, LBP    Diagnostic tests  lumbar/cervical - small central disc bulge C5/6     Patient Stated Goals  to get rid of pain    Currently in Pain?  Yes    Pain Score  7     Pain Location  Coccyx    Pain Orientation  Right    Pain Descriptors / Indicators  Sharp    Pain Score  10    Pain Location  Back by R shoulder blade    Pain Orientation  Right;Upper    Pain Descriptors / Indicators  Sharp    Pain Type  Chronic pain         [facility] PT Assessment - 06/01/18 0001      Precautions   Precautions  Fall;Other (comment)    Precaution Comments  FALL RISK/PASSES OUT                    [facility] Adult PT Treatment/Exercise - 06/01/18 0001      Neck Exercises: Supine   Cervical Isometrics  Right lateral flexion;Left lateral flexion;10 secs;5 reps    Neck Retraction  5 reps    Other Supine Exercise  shoulder retraction x 5      Lumbar Exercises:  Supine   Other Supine Lumbar Exercises  leg press down into bolsters x 5 ea; leg lengthener heel toward wall x 5 each side      Modalities   Modalities  Electrical Stimulation;Moist Heat      Moist Heat Therapy   Number Minutes Moist Heat  20 Minutes    Moist Heat Location  Cervical;Lumbar Spine      Electrical Stimulation   Electrical Stimulation Location  UT/lumbar    Electrical Stimulation Action  IFC 80-150 Hz x 20 min    Electrical Stimulation Goals  Pain;Tone       Trigger Point Dry Needling - 06/01/18 1326    Consent Given?  Yes    Education Handout Provided  Yes    Muscles Treated Upper Body  Upper trapezius    Upper Trapezius Response  Twitch reponse elicited;Palpable increased muscle length           PT Education - 06/01/18 1512    Education Details  DN education and aftercare; HEP    Person(s) Educated  Patient    Methods  Explanation;Demonstration;Verbal cues;Handout    Comprehension   Verbalized understanding;Returned demonstration       PT Short Term Goals - 05/24/18 1232      PT SHORT TERM GOAL #1   Title  I with initial HEP    Time  4    Period  Weeks    Status  New    Target Date  06/21/18      PT SHORT TERM GOAL #2   Title  Patient to report pain decrease by 25% overall    Time  4    Period  Weeks    Status  New      PT SHORT TERM GOAL #3   Title  Patient to demo improved postural alignement (normal head position, no left lumbar SB, no post pelvic tilt).    Time  4    Period  Weeks    Status  New        PT Long Term Goals - 05/24/18 1236      PT LONG TERM GOAL #1   Title  Patient to report decreased pain overall by 60% or more with ADLS.    Time  8    Period  Weeks    Status  New    Target Date  07/19/18      PT LONG TERM GOAL #2   Title  Patient to demo improved cervical rotation to [facility] to normalize ADLs.    Time  8    Period  Weeks    Status  New      PT LONG TERM GOAL #3   Title  Patient able to tolerate standing and walkiing without the use of back brace.    Time  8    Period  Weeks    Status  New      PT LONG TERM GOAL #4   Title  Patient to report improved function overall by 50% or more.    Time  8    Period  Weeks    Status  New            Plan - 06/01/18 1343    Clinical Impression Statement  Patient gave consent to DN and tolerated DN to bil UT fair. She could not tolerate STW beyond palpation. Estim and heat were applied and she was able to perform decompression exercises without increased pain.  Significant improvement in  movment today vs. last visit. LTGs are ongoing.    PT Frequency  3x / week    PT Duration  8 weeks    PT Treatment/Interventions  ADLs/Self Care Home Management;Cryotherapy;Moist Heat;Ultrasound;Neuromuscular re-education;Therapeutic exercise;Patient/family education;Manual techniques;Taping;Dry needling;Scar mobilization;Passive range of motion    PT Next Visit Plan  posture, cervical retraction  and ROM (passive and active); AAROM for shoulders; lumbar ROM/pelvic tilts; progress to strengtheing as tolerated. Try OP decompression exercises. Estim/heat for pain.    PT Home Exercise Plan  supine chin tucks, LTR, decompression exercises and cervical isometrics    Consulted and Agree with Plan of Care  Patient       Patient will benefit from skilled therapeutic intervention in order to improve the following deficits and impairments:  Decreased range of motion, Pain, Decreased activity tolerance, Decreased strength  Visit Diagnosis: Cervicalgia  Chronic bilateral low back pain, with sciatica presence unspecified  Cramp and spasm     Problem List Patient Active Problem List   Diagnosis Date Noted  . Uterine fibroid 01/18/2018  . Cervical radiculopathy 01/01/2018  . Pain disorder 08/29/2017  . Positive depression screening 08/29/2017  . Neck pain without injury 08/24/2017  . Chronic fatigue 05/15/2017  . Common migraine 04/15/2017  . Axillary mass, right 04/09/2017  . History of bulimia nervosa 03/13/2015  . GERD (gastroesophageal reflux disease) 03/13/2015  . Pap smear for cervical cancer screening 03/13/2015  . SOB (shortness of breath) 03/13/2015  . H/O vitamin D deficiency 03/13/2015  . Health care maintenance 01/25/2015  . Chronic tension type headache 01/25/2015  . Muscle pain, fibromyalgia 01/24/2015  . Allergic rhinitis 01/24/2015  . Lumbar radicular pain 11/22/2014  . Thoracic neuralgia 11/22/2014  . Back pain   . Hemoglobin AC 07/03/2014  . POTS (postural orthostatic tachycardia syndrome) 03/24/2014  . Abdominal pain 06/06/2012  . Chronic pain 06/06/2012  . Depression 06/06/2012  . HEMOPTYSIS UNSPECIFIED 12/14/2009  . BREAST TENDERNESS 11/08/2009  . NIPPLE DISCHARGE 11/08/2009  . MENORRHAGIA 11/08/2009  . DISORDER, BIPOLAR NOS 08/06/2007  . PERSONALITY DISORDER 08/06/2007  . MARIJUANA ABUSE 08/06/2007  . NARCOTIC ABUSE 08/06/2007  . SEIZURE DISORDER  08/06/2007  . SOMATIZATION DISORDER 12/27/2005    Dayla Gasca PT 06/01/2018, 3:18 PM  [facility] [address] [place], [place], [address] Phone: [phone]   Fax:  [phone]  Name: [name] MRN: [id number] Date of Birth: Dec 25, 1985

## 2018-06-01 NOTE — Patient Instructions (Addendum)
Trigger Point Dry Needling  . What is Trigger Point Dry Needling (DN)? o DN is a physical therapy technique used to treat muscle pain and dysfunction. Specifically, DN helps deactivate muscle trigger points (muscle knots).  o A thin filiform needle is used to penetrate the skin and stimulate the underlying trigger point. The goal is for a local twitch response (LTR) to occur and for the trigger point to relax. No medication of any kind is injected during the procedure.   . What Does Trigger Point Dry Needling Feel Like?  o The procedure feels different for each individual patient. Some patients report that they do not actually feel the needle enter the skin and overall the process is not painful. Very mild bleeding may occur. However, many patients feel a deep cramping in the muscle in which the needle was inserted. This is the local twitch response.   Marland Kitchen How Will I feel after the treatment? o Soreness is normal, and the onset of soreness may not occur for a few hours. Typically this soreness does not last longer than two days.  o Bruising is uncommon, however; ice can be used to decrease any possible bruising.  o In rare cases feeling tired or nauseous after the treatment is normal. In addition, your symptoms may get worse before they get better, this period will typically not last longer than 24 hours.   . What Can I do After My Treatment? o Increase your hydration by drinking more water for the next 24 hours. o You may place ice or heat on the areas treated that have become sore, however, do not use heat on inflamed or bruised areas. Heat often brings more relief post needling. o You can continue your regular activities, but vigorous activity is not recommended initially after the treatment for 24 hours. o DN is best combined with other physical therapy such as strengthening, stretching, and other therapies.    Precautions:  In some cases, dry needling is done over the lung field. While rare,  there is a risk of pneumothorax (punctured lung). Because of this, if you ever experience shortness of breath on exertion, difficulty taking a deep breath, chest pain or a dry cough following dry needling, you should report to an emergency room and tell them that you have been dry needled over the thorax.   RE-ALIGNMENT ROUTINE EXERCISES-OSTEOPROROSIS BASIC FOR POSTURAL CORRECTION   RE-ALIGNMENT Tips BENEFITS: 1.It helps to re-align the curves of the back and improve standing posture. 2.It allows the back muscles to rest and strengthen in preparation for more activity. FREQUENCY: Daily, even after weeks, months and years of more advanced exercises. START: 1.All exercises start in the same position: lying on the back, arms resting on the supporting surface, palms up and slightly away from the body, backs of hands down, knees bent, feet flat. 2.The head, neck, arms, and legs are supported according to specific instructions of your therapist. Copyright  VHI. All rights reserved.   Do these on your bed with head and legs supported to your comfort.  1. Decompression Exercise: Basic.   Takes compression off the vertebral bodies; increases tolerance for lying on the back; helps relieve back pain   Lie on back on firm surface, knees bent, feet flat, arms turned up, out to sides (~35 degrees). Head neck and arms supported as necessary. Time _5-15__ minutes. Surface: floor     2. Shoulder Press  Strengthens upper back extensors and scapular retractors.   Press both shoulders down.  Hold _2-3__ seconds. Repeat _3-5__ times. Surface: floor        3. Head Press With Wilson  Strengthens neck extensors   Only tuck chin if you can tolerate it. Tuck chin SLIGHTLY toward chest, keep mouth closed. Feel weight on back of head. Increase weight by pressing head down. Hold _2-3__ seconds. Relax. Repeat 3-5___ times. Surface: floor     4. Leg Lengthener: stretches quadratus lumborum and hip  flexors.  Strengthens quads and ankle dorsiflexors.  Leg Lengthener: Full    Straighten one leg. Pull toes AND forefoot toward knee, extend heel. Lengthen leg by pulling pelvis away from ribs. Hold __5_ seconds. Relax. Repeat 1 time. Re-bend knee. Do other leg. Each leg __5_ times. Surface: floor    Leg Lengthener / Leg Press Combo: Single Leg    Straighten one leg down to floor. Pull toes AND forefoot toward knee; extend heel. Lengthen leg by pulling pelvis away from ribs. Press leg down. DO NOT BEND KNEE. Hold _5__ seconds. Relax leg. Repeat exercise 1 time. Relax leg. Re-bend knee. Repeat with other leg. Do 5 times   Strengthening: Alternating Isometrics (in Neutral)   Resist alternating light pressure on each side, starting at front and moving to back of head in straight line. Keep head facing forward. Do not bend, lean, or turn head. Hold _10___ seconds. Repeat __5__ times per direction.  Do _1_ sessions per day.  http://orth.exer.us/311   Copyright  VHI. All rights reserved.   [name], PT 06/01/18 1:36 PM

## 2018-06-02 ENCOUNTER — Encounter (HOSPITAL_COMMUNITY): Payer: Self-pay | Admitting: *Deleted

## 2018-06-02 NOTE — Progress Notes (Signed)
Letter mailed to patient with negative pap smear results. HPV was negative. Next pap smear due in five years. Breast discharge was benign.

## 2018-06-03 ENCOUNTER — Ambulatory Visit: Payer: No Typology Code available for payment source | Admitting: Physical Therapy

## 2018-06-04 ENCOUNTER — Ambulatory Visit: Payer: No Typology Code available for payment source | Admitting: Physical Therapy

## 2018-06-08 ENCOUNTER — Ambulatory Visit: Payer: No Typology Code available for payment source | Admitting: Physical Therapy

## 2018-06-10 ENCOUNTER — Ambulatory Visit: Payer: No Typology Code available for payment source | Admitting: Obstetrics and Gynecology

## 2018-06-10 ENCOUNTER — Encounter: Payer: Self-pay | Admitting: Physical Therapy

## 2018-06-10 ENCOUNTER — Encounter

## 2018-06-10 ENCOUNTER — Encounter: Payer: Self-pay | Admitting: Psychology

## 2018-06-10 ENCOUNTER — Ambulatory Visit: Payer: No Typology Code available for payment source | Admitting: Physical Therapy

## 2018-06-10 DIAGNOSIS — M542 Cervicalgia: Secondary | ICD-10-CM

## 2018-06-10 DIAGNOSIS — G8929 Other chronic pain: Secondary | ICD-10-CM

## 2018-06-10 DIAGNOSIS — R252 Cramp and spasm: Secondary | ICD-10-CM

## 2018-06-10 DIAGNOSIS — M545 Low back pain: Secondary | ICD-10-CM
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
## 2018-06-10 NOTE — Therapy (Signed)
[facility] [place], [place], [address] Phone: [phone]   Fax:  [phone]  Physical Therapy Treatment  Patient Details  Name: [name] MRN: [id number] Date of Birth: [birth year] Referring Provider: Alysia Penna   Encounter Date: 06/10/2018  PT End of Session - 06/10/18 1022    Visit Number  4    Number of Visits  24    Date for PT Re-Evaluation  07/19/18    PT Start Time  0930    PT Stop Time  1024    PT Time Calculation (min)  54 min       Past Medical History:  Diagnosis Date  . Allergic rhinitis 01/24/2015  . Arrhythmia Dx 2015  . Back pain   . Breast mass   . Common migraine 04/15/2017  . Depression Dx 2000  . Fibromyalgia   . GERD (gastroesophageal reflux disease) 03/13/2015  . Headache    migraines  . Heart murmur Dx 2015  . 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x [address]  . Lumbar radicular pain 11/22/2014  . Lumbar radiculopathy   . Pain disorder 08/29/2017  . Panic attack Dx 2006  . Positive depression screening 08/29/2017  . POTS (postural orthostatic tachycardia syndrome) 03/24/2014  . PTSD (post-traumatic stress disorder) Dx 2013  . Seizures ([place]) 2001  . Vitamin D deficiency 01/24/2015    Past Surgical History:  Procedure Laterality Date  . ESOPHAGOGASTRODUODENOSCOPY N/A 08/14/2015   Procedure: ESOPHAGOGASTRODUODENOSCOPY (EGD);  Surgeon: Carol Ada, MD;  Location: [facility];  Service: Endoscopy;  Laterality: N/A;    There were no vitals filed for this visit.  Subjective Assessment - 06/10/18 0936    Subjective  Pt reports feeling so so and having pain in the rib cage.     Currently in Pain?  Yes    Pain Score  9     Pain Location  Rib cage                       [facility] Adult PT Treatment/Exercise - 06/10/18 0001      Posture/Postural Control   Posture/Postural Control  Postural limitations    Postural Limitations   Rounded Shoulders;Forward head;Increased lumbar lordosis;Anterior pelvic tilt      Self-Care   Self-Care  ADL's;Posture;Heat/Ice Application    ADL's  meditation and intergrated breathing drills    Heat/Ice Application  alternating 20/20 at home regularly      Neck Exercises: Supine   Other Supine Exercise  shoulder retraction x 10      Lumbar Exercises: Aerobic   UBE (Upper Arm Bike)  L1 4 Min      Lumbar Exercises: Supine   Other Supine Lumbar Exercises  Diaphragm breathing 3x 10      Modalities   Modalities  Electrical Stimulation;Moist Heat      Moist Heat Therapy   Number Minutes Moist Heat  15 Minutes    Moist Heat Location  Cervical;Lumbar Spine      Electrical Stimulation   Electrical Stimulation Location  cervical     Electrical Stimulation Action  IFC 80-150 Hz    Electrical Stimulation Goals  Pain;Tone      Manual Therapy   Manual Therapy  Passive ROM;Myofascial release;Manual Traction    Manual therapy comments  Cervical rotation and manual traction     Myofascial Release  upper traps    Passive ROM  cervical  rotation    Manual Traction  cervical             PT Education - 06/10/18 1021    Education Details  Went over home TENS unit and application    Person(s) Educated  Patient    Methods  Explanation    Comprehension  Verbalized understanding       PT Short Term Goals - 05/24/18 1232      PT SHORT TERM GOAL #1   Title  I with initial HEP    Time  4    Period  Weeks    Status  New    Target Date  06/21/18      PT SHORT TERM GOAL #2   Title  Patient to report pain decrease by 25% overall    Time  4    Period  Weeks    Status  New      PT SHORT TERM GOAL #3   Title  Patient to demo improved postural alignement (normal head position, no left lumbar SB, no post pelvic tilt).    Time  4    Period  Weeks    Status  New        PT Long Term Goals - 05/24/18 1236      PT LONG TERM GOAL #1   Title  Patient to report decreased pain  overall by 60% or more with ADLS.    Time  8    Period  Weeks    Status  New    Target Date  07/19/18      PT LONG TERM GOAL #2   Title  Patient to demo improved cervical rotation to [facility] to normalize ADLs.    Time  8    Period  Weeks    Status  New      PT LONG TERM GOAL #3   Title  Patient able to tolerate standing and walkiing without the use of back brace.    Time  8    Period  Weeks    Status  New      PT LONG TERM GOAL #4   Title  Patient to report improved function overall by 50% or more.    Time  8    Period  Weeks    Status  New            Plan - 06/10/18 1027    Clinical Impression Statement  Pt tolerated treeatment well. Pt reported having been sleeping a lot and having rib pain. Pt tolerated very slight ROM to the cervical spine. Pt required verbal cues to correct alignment of the thoracolumbar structures to improved tension, decrease muscular tone, and ease the nervous system. Pt reported that IFC and moist heat felt great.     Rehab Potential  Good    PT Frequency  3x / week    PT Duration  8 weeks    PT Treatment/Interventions  ADLs/Self Care Home Management;Cryotherapy;Moist Heat;Ultrasound;Neuromuscular re-education;Therapeutic exercise;Patient/family education;Manual techniques;Taping;Dry needling;Scar mobilization;Passive range of motion    PT Next Visit Plan  posture, cervical retraction and ROM (passive and active); AAROM for shoulders; lumbar ROM/pelvic tilts; progress to strengtheing as tolerated. Try OP decompression exercises. Estim/heat for pain. Breathing drills for diaphragm.        Patient will benefit from skilled therapeutic intervention in order to improve the following deficits and impairments:  Decreased range of motion, Pain, Decreased activity tolerance, Decreased strength  Visit Diagnosis: Cervicalgia  Chronic bilateral  low back pain, with sciatica presence unspecified  Cramp and spasm     Problem List Patient Active Problem  List   Diagnosis Date Noted  . Uterine fibroid 01/18/2018  . Cervical radiculopathy 01/01/2018  . Pain disorder 08/29/2017  . Positive depression screening 08/29/2017  . Neck pain without injury 08/24/2017  . Chronic fatigue 05/15/2017  . Common migraine 04/15/2017  . Axillary mass, right 04/09/2017  . History of bulimia nervosa 03/13/2015  . GERD (gastroesophageal reflux disease) 03/13/2015  . Pap smear for cervical cancer screening 03/13/2015  . SOB (shortness of breath) 03/13/2015  . H/O vitamin D deficiency 03/13/2015  . Health care maintenance 01/25/2015  . Chronic tension type headache 01/25/2015  . Muscle pain, fibromyalgia 01/24/2015  . Allergic rhinitis 01/24/2015  . Lumbar radicular pain 11/22/2014  . Thoracic neuralgia 11/22/2014  . Back pain   . Hemoglobin AC 07/03/2014  . POTS (postural orthostatic tachycardia syndrome) 03/24/2014  . Abdominal pain 06/06/2012  . Chronic pain 06/06/2012  . Depression 06/06/2012  . HEMOPTYSIS UNSPECIFIED 12/14/2009  . BREAST TENDERNESS 11/08/2009  . NIPPLE DISCHARGE 11/08/2009  . MENORRHAGIA 11/08/2009  . DISORDER, BIPOLAR NOS 08/06/2007  . PERSONALITY DISORDER 08/06/2007  . MARIJUANA ABUSE 08/06/2007  . NARCOTIC ABUSE 08/06/2007  . SEIZURE DISORDER 08/06/2007  . SOMATIZATION DISORDER 12/27/2005    Maryelizabeth Kaufmann, SPTA 06/10/2018, 10:32 AM  [facility] [place], [place], [address] Phone: [phone]   Fax:  [phone]  Name: [name] MRN: [id number] Date of Birth: Sep 27, 1986

## 2018-06-11 ENCOUNTER — Ambulatory Visit: Payer: No Typology Code available for payment source | Admitting: Physical Therapy

## 2018-06-15 ENCOUNTER — Encounter: Payer: Self-pay | Admitting: Physical Therapy

## 2018-06-15 ENCOUNTER — Ambulatory Visit: Payer: No Typology Code available for payment source | Admitting: Physical Therapy

## 2018-06-15 DIAGNOSIS — G8929 Other chronic pain: Secondary | ICD-10-CM

## 2018-06-15 DIAGNOSIS — R252 Cramp and spasm: Secondary | ICD-10-CM

## 2018-06-15 DIAGNOSIS — M545 Low back pain: Principal | ICD-10-CM

## 2018-06-15 DIAGNOSIS — M542 Cervicalgia: Secondary | ICD-10-CM

## 2018-06-15 NOTE — Therapy (Signed)
[facility] [place], [place], [address] Phone: [phone]   Fax:  [phone]  Physical Therapy Treatment  Patient Details  Name: [name] MRN: [id number] Date of Birth: February 01, 1986 Referring Provider: Alysia Penna   Encounter Date: 06/15/2018  PT End of Session - 06/15/18 0958    Visit Number  5    Number of Visits  24    Date for PT Re-Evaluation  07/19/18    PT Start Time  0932    PT Stop Time  1013    PT Time Calculation (min)  41 min    Activity Tolerance  Patient limited by pain    Behavior During Therapy  Anxious       Past Medical History:  Diagnosis Date  . Allergic rhinitis 01/24/2015  . Arrhythmia Dx 2015  . Back pain   . Breast mass   . Common migraine 04/15/2017  . Depression Dx 2000  . Fibromyalgia   . GERD (gastroesophageal reflux disease) 03/13/2015  . Headache    migraines  . Heart murmur Dx 2015  . 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x [address]  . Lumbar radicular pain 11/22/2014  . Lumbar radiculopathy   . Pain disorder 08/29/2017  . Panic attack Dx 2006  . Positive depression screening 08/29/2017  . POTS (postural orthostatic tachycardia syndrome) 03/24/2014  . PTSD (post-traumatic stress disorder) Dx 2013  . Seizures ([place]) 2001  . Vitamin D deficiency 01/24/2015    Past Surgical History:  Procedure Laterality Date  . ESOPHAGOGASTRODUODENOSCOPY N/A 08/14/2015   Procedure: ESOPHAGOGASTRODUODENOSCOPY (EGD);  Surgeon: Carol Ada, MD;  Location: [facility];  Service: Endoscopy;  Laterality: N/A;    There were no vitals filed for this visit.  Subjective Assessment - 06/15/18 0934    Subjective  "it could be better, Im just in a lot of pain"    Currently in Pain?  Yes    Pain Score  10-Worst pain ever    Pain Location  -- All over                       [facility] Adult PT Treatment/Exercise - 06/15/18 0001      Neck  Exercises: Seated   Other Seated Exercise  forward pressed with green ball  2x10       Lumbar Exercises: Seated   Other Seated Lumbar Exercises  press downs with tband,     Other Seated Lumbar Exercises  small marches, hip abd,       Modalities   Modalities  Electrical Stimulation;Moist Heat      Moist Heat Therapy   Number Minutes Moist Heat  15 Minutes    Moist Heat Location  Cervical;Lumbar Spine               PT Short Term Goals - 05/24/18 1232      PT SHORT TERM GOAL #1   Title  I with initial HEP    Time  4    Period  Weeks    Status  New    Target Date  06/21/18      PT SHORT TERM GOAL #2   Title  Patient to report pain decrease by 25% overall    Time  4    Period  Weeks    Status  New      PT SHORT TERM GOAL #3   Title  Patient  to demo improved postural alignement (normal head position, no left lumbar SB, no post pelvic tilt).    Time  4    Period  Weeks    Status  New        PT Long Term Goals - 05/24/18 1236      PT LONG TERM GOAL #1   Title  Patient to report decreased pain overall by 60% or more with ADLS.    Time  8    Period  Weeks    Status  New    Target Date  07/19/18      PT LONG TERM GOAL #2   Title  Patient to demo improved cervical rotation to [facility] to normalize ADLs.    Time  8    Period  Weeks    Status  New      PT LONG TERM GOAL #3   Title  Patient able to tolerate standing and walkiing without the use of back brace.    Time  8    Period  Weeks    Status  New      PT LONG TERM GOAL #4   Title  Patient to report improved function overall by 50% or more.    Time  8    Period  Weeks    Status  New            Plan - 06/15/18 6387    Clinical Impression Statement  Pt did not progress towards goals. Pt reports 101/10 pain throughout treatment that increase with most movement. Pt ambulated in clinic but was unable to complete a LAQ without compensation lifting her bottom off mat table with her arms to extend LE. This   same compensation was used to do a HS curl with little to no resistance. An increase in pain reported in her shoulders with very light touch. Pt had her own TEN's unit connected to her shoulder and only wanted MHP.      Rehab Potential  Good    PT Frequency  3x / week    PT Duration  8 weeks    PT Next Visit Plan  posture, cervical retraction and ROM (passive and active); AAROM for shoulders; lumbar ROM/pelvic tilts; progress to strengtheing as tolerated. Try OP decompression exercises. Estim/heat for pain. Breathing drills for diaphragm.        Patient will benefit from skilled therapeutic intervention in order to improve the following deficits and impairments:  Decreased range of motion, Pain, Decreased activity tolerance, Decreased strength  Visit Diagnosis: Chronic bilateral low back pain, with sciatica presence unspecified  Cervicalgia  Cramp and spasm     Problem List Patient Active Problem List   Diagnosis Date Noted  . Uterine fibroid 01/18/2018  . Cervical radiculopathy 01/01/2018  . Pain disorder 08/29/2017  . Positive depression screening 08/29/2017  . Neck pain without injury 08/24/2017  . Chronic fatigue 05/15/2017  . Common migraine 04/15/2017  . Axillary mass, right 04/09/2017  . History of bulimia nervosa 03/13/2015  . GERD (gastroesophageal reflux disease) 03/13/2015  . Pap smear for cervical cancer screening 03/13/2015  . SOB (shortness of breath) 03/13/2015  . H/O vitamin D deficiency 03/13/2015  . Health care maintenance 01/25/2015  . Chronic tension type headache 01/25/2015  . Muscle pain, fibromyalgia 01/24/2015  . Allergic rhinitis 01/24/2015  . Lumbar radicular pain 11/22/2014  . Thoracic neuralgia 11/22/2014  . Back pain   . Hemoglobin AC 07/03/2014  . POTS (postural orthostatic tachycardia syndrome)  03/24/2014  . Abdominal pain 06/06/2012  . Chronic pain 06/06/2012  . Depression 06/06/2012  . HEMOPTYSIS UNSPECIFIED 12/14/2009  . BREAST  TENDERNESS 11/08/2009  . NIPPLE DISCHARGE 11/08/2009  . MENORRHAGIA 11/08/2009  . DISORDER, BIPOLAR NOS 08/06/2007  . PERSONALITY DISORDER 08/06/2007  . MARIJUANA ABUSE 08/06/2007  . NARCOTIC ABUSE 08/06/2007  . SEIZURE DISORDER 08/06/2007  . SOMATIZATION DISORDER 12/27/2005    Scot Jun, PTA 06/15/2018, 10:04 AM  [facility] [place], [place], [address] Phone: [phone]   Fax:  [phone]  Name: BESAN KETCHEM MRN: [id number] Date of Birth: 06-20-1986

## 2018-06-17 ENCOUNTER — Ambulatory Visit: Payer: No Typology Code available for payment source | Admitting: Physical Therapy

## 2018-06-17 ENCOUNTER — Ambulatory Visit (INDEPENDENT_AMBULATORY_CARE_PROVIDER_SITE_OTHER): Payer: Self-pay | Admitting: Obstetrics & Gynecology

## 2018-06-17 ENCOUNTER — Ambulatory Visit: Payer: No Typology Code available for payment source | Admitting: Obstetrics & Gynecology

## 2018-06-17 ENCOUNTER — Encounter: Payer: Self-pay | Admitting: Obstetrics & Gynecology

## 2018-06-17 DIAGNOSIS — M545 Low back pain: Principal | ICD-10-CM

## 2018-06-17 DIAGNOSIS — R252 Cramp and spasm: Secondary | ICD-10-CM

## 2018-06-17 DIAGNOSIS — M542 Cervicalgia: Secondary | ICD-10-CM

## 2018-06-17 DIAGNOSIS — G8929 Other chronic pain: Secondary | ICD-10-CM

## 2018-06-17 DIAGNOSIS — N938 Other specified abnormal uterine and vaginal bleeding: Secondary | ICD-10-CM

## 2018-06-17 DIAGNOSIS — Z3202 Encounter for pregnancy test, result negative: Secondary | ICD-10-CM

## 2018-06-17 LAB — POCT PREGNANCY, URINE: Preg Test, Ur: NEGATIVE

## 2018-06-17 MED ORDER — NORGESTIMATE-ETH ESTRADIOL 0.25-35 MG-MCG PO TABS: 1.0000 | ORAL_TABLET | Freq: Every day | ORAL | 5 refills | Status: DC

## 2018-06-17 NOTE — Patient Instructions (Signed)

## 2018-06-17 NOTE — Therapy (Signed)
[facility] [place], [place], [address] Phone: [phone]   Fax:  [phone]  Physical Therapy Treatment  Patient Details  Name: [name] MRN: [id number] Date of Birth: [birth year] Referring Provider: Alysia Penna   Encounter Date: 06/17/2018  PT End of Session - 06/17/18 1148    Visit Number  6    Number of Visits  24    Date for PT Re-Evaluation  07/19/18    PT Start Time  1104    PT Stop Time  1157    PT Time Calculation (min)  53 min    Activity Tolerance  Patient limited by pain    Behavior During Therapy  Anxious       Past Medical History:  Diagnosis Date  . Allergic rhinitis 01/24/2015  . Arrhythmia Dx 2015  . Back pain   . Breast mass   . Common migraine 04/15/2017  . Depression Dx 2000  . Fibromyalgia   . GERD (gastroesophageal reflux disease) 03/13/2015  . Headache    migraines  . Heart murmur Dx 2015  . 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 Dx [address]  . Lumbar radicular pain 11/22/2014  . Lumbar radiculopathy   . Pain disorder 08/29/2017  . Panic attack Dx 2006  . Positive depression screening 08/29/2017  . POTS (postural orthostatic tachycardia syndrome) 03/24/2014  . PTSD (post-traumatic stress disorder) Dx 2013  . Seizures ([place]) 2001  . Vitamin D deficiency 01/24/2015    Past Surgical History:  Procedure Laterality Date  . ESOPHAGOGASTRODUODENOSCOPY N/A 08/14/2015   Procedure: ESOPHAGOGASTRODUODENOSCOPY (EGD);  Surgeon: Carol Ada, MD;  Location: [facility];  Service: Endoscopy;  Laterality: N/A;    There were no vitals filed for this visit.  Subjective Assessment - 06/17/18 1107    Subjective  Pt reported feeling better today.     Currently in Pain?  Yes    Pain Score  6     Pain Location  Abdomen    Pain Orientation  Lower                       [facility] Adult PT Treatment/Exercise - 06/17/18 0001      Lumbar  Exercises: Seated   Other Seated Lumbar Exercises  ball squeeze 2 x 10      Lumbar Exercises: Supine   Other Supine Lumbar Exercises  Diaphragm breathing 3x 10      Modalities   Modalities  Electrical Stimulation      Moist Heat Therapy   Number Minutes Moist Heat  15 Minutes    Moist Heat Location  Lumbar Spine      Electrical Stimulation   Electrical Stimulation Location  thoracic and lumbar    Electrical Stimulation Action  premod    Electrical Stimulation Goals  Pain;Tone      Manual Therapy   Manual Therapy  Passive ROM    Manual therapy comments  LE and lumbar rotation in supine               PT Short Term Goals - 06/17/18 1145      PT SHORT TERM GOAL #1   Baseline  able to and doing all exercises but depending on the day she has increased pain as result     Status  Achieved        PT Long Term Goals - 05/24/18 1236  PT LONG TERM GOAL #1   Title  Patient to report decreased pain overall by 60% or more with ADLS.    Time  8    Period  Weeks    Status  New    Target Date  07/19/18      PT LONG TERM GOAL #2   Title  Patient to demo improved cervical rotation to [facility] to normalize ADLs.    Time  8    Period  Weeks    Status  New      PT LONG TERM GOAL #3   Title  Patient able to tolerate standing and walkiing without the use of back brace.    Time  8    Period  Weeks    Status  New      PT LONG TERM GOAL #4   Title  Patient to report improved function overall by 50% or more.    Time  8    Period  Weeks    Status  New            Plan - 06/17/18 1150    Clinical Impression Statement  Pt stated she has been doing all HEP routinely but sometimes with increased pain. Pt ambulated into clinic but when performing PROM to hamstrings was hardly able to have leg lifted off table top in supine. Pt limited bu anxiousness and fatigue. Pt reported that she liked the feeling of cervical traction but we ultimately stopped treatment 5 minutes end due to  discomfort at 5 lb. pressure. Upon discussion decided to try manual cervical traction next visit or as needed. Pt required constant tactile and verbal cues to correct posture during supine exercises and PROM.     PT Frequency  3x / week    PT Treatment/Interventions  ADLs/Self Care Home Management;Cryotherapy;Moist Heat;Ultrasound;Neuromuscular re-education;Therapeutic exercise;Patient/family education;Manual techniques;Taping;Dry needling;Scar mobilization;Passive range of motion    PT Next Visit Plan  posture, cervical retraction and ROM (passive and active); AAROM for shoulders; lumbar ROM/pelvic tilts; progress to strengtheing as tolerated. Try OP decompression exercises. Estim/heat for pain. Breathing drills for diaphragm.     PT Home Exercise Plan  supine chin tucks, LTR, decompression exercises and cervical isometrics    Consulted and Agree with Plan of Care  Patient       Patient will benefit from skilled therapeutic intervention in order to improve the following deficits and impairments:  Decreased range of motion, Pain, Decreased activity tolerance, Decreased strength  Visit Diagnosis: Chronic bilateral low back pain, with sciatica presence unspecified  Cervicalgia  Cramp and spasm     Problem List Patient Active Problem List   Diagnosis Date Noted  . DUB (dysfunctional uterine bleeding) 06/17/2018  . Uterine fibroid 01/18/2018  . Cervical radiculopathy 01/01/2018  . Pain disorder 08/29/2017  . Positive depression screening 08/29/2017  . Neck pain without injury 08/24/2017  . Chronic fatigue 05/15/2017  . Common migraine 04/15/2017  . Axillary mass, right 04/09/2017  . History of bulimia nervosa 03/13/2015  . GERD (gastroesophageal reflux disease) 03/13/2015  . SOB (shortness of breath) 03/13/2015  . H/O vitamin D deficiency 03/13/2015  . Health care maintenance 01/25/2015  . Chronic tension type headache 01/25/2015  . Muscle pain, fibromyalgia 01/24/2015  . Allergic  rhinitis 01/24/2015  . Lumbar radicular pain 11/22/2014  . Thoracic neuralgia 11/22/2014  . Back pain   . Hemoglobin AC 07/03/2014  . POTS (postural orthostatic tachycardia syndrome) 03/24/2014  . Abdominal pain 06/06/2012  . Chronic pain  06/06/2012  . Depression 06/06/2012  . HEMOPTYSIS UNSPECIFIED 12/14/2009  . BREAST TENDERNESS 11/08/2009  . NIPPLE DISCHARGE 11/08/2009  . MENORRHAGIA 11/08/2009  . DISORDER, BIPOLAR NOS 08/06/2007  . PERSONALITY DISORDER 08/06/2007  . MARIJUANA ABUSE 08/06/2007  . NARCOTIC ABUSE 08/06/2007  . SEIZURE DISORDER 08/06/2007  . SOMATIZATION DISORDER 12/27/2005    Maryelizabeth Kaufmann, SPTA 06/17/2018, 12:04 PM  [facility] [place], [place], [address] Phone: [phone]   Fax:  [phone]  Name: [name] MRN: [id number] Date of Birth: 1986/04/22

## 2018-06-17 NOTE — Progress Notes (Signed)
Patient ID: [name], female   DOB: 1986/04/07, 32 y.o.   MRN: [id number]  Chief Complaint  Patient presents with  . Menorrhagia    HPI [name] is a 32 y.o. female.  [id number] Patient's last menstrual period was 06/14/2018 (exact date). She has had DUB with a MAU visit 04/30/18 after bleeding for 30 days. She has wished to conceive in the past but is now willing to be on OCP for cycle control. HPI  Past Medical History:  Diagnosis Date  . Allergic rhinitis 01/24/2015  . Arrhythmia Dx 2015  . Back pain   . Breast mass   . Common migraine 04/15/2017  . Depression Dx 2000  . Fibromyalgia   . GERD (gastroesophageal reflux disease) 03/13/2015  . Headache    migraines  . Heart murmur Dx 2015  . 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x [address]  . Lumbar radicular pain 11/22/2014  . Lumbar radiculopathy   . Pain disorder 08/29/2017  . Panic attack Dx 2006  . Positive depression screening 08/29/2017  . POTS (postural orthostatic tachycardia syndrome) 03/24/2014  . PTSD (post-traumatic stress disorder) Dx 2013  . Seizures ([place]) 2001  . Vitamin D deficiency 01/24/2015    Past Surgical History:  Procedure Laterality Date  . ESOPHAGOGASTRODUODENOSCOPY N/A 08/14/2015   Procedure: ESOPHAGOGASTRODUODENOSCOPY (EGD);  Surgeon: Carol Ada, MD;  Location: [facility];  Service: Endoscopy;  Laterality: N/A;    Family History  Problem Relation Age of Onset  . Heart attack Mother   . Heart disease Mother   . Sudden death Mother   . Sudden death Sister   . Heart attack Sister   . Sudden death Maternal Grandmother   . Fainting Maternal Grandmother   . Heart attack Maternal Grandmother   . Breast cancer Paternal Grandmother   . Breast cancer Cousin   . Breast cancer Paternal Aunt   . Seizures Maternal Aunt     Social History Social History   Tobacco Use  . Smoking status: Former Smoker    Last attempt to quit: 06/23/2012    Years since quitting: 5.9   . Smokeless tobacco: Never Used  Substance Use Topics  . Alcohol use: Not Currently    Comment: rarely  . Drug use: No    Allergies  Allergen Reactions  . Aspirin Hives    Current Outpatient Medications  Medication Sig Dispense Refill  . diclofenac sodium (VOLTAREN) 1 % GEL Apply 2 g topically 4 (four) times daily. 100 g 1  . dicyclomine (BENTYL) 20 MG tablet Take 1 tablet (20 mg total) by mouth 2 (two) times daily as needed for spasms. 20 tablet 0  . ferrous sulfate 325 (65 FE) MG tablet Take 1 tablet (325 mg total) by mouth daily with breakfast. 90 tablet 3  . gabapentin (NEURONTIN) 300 MG capsule Take 1 cap in AM, 2 caps in PM 90 capsule 6  . methocarbamol (ROBAXIN) 500 MG tablet Take 1 tablet (500 mg total) by mouth every 8 (eight) hours as needed. 60 tablet 2  . metoprolol succinate (TOPROL-XL) 100 MG 24 hr tablet Take 1 tablet (100 mg total) by mouth daily. 30 tablet 3  . ondansetron (ZOFRAN) 4 MG tablet Take 1 tablet (4 mg total) by mouth every 6 (six) hours. 12 tablet 0  . ranitidine (ZANTAC) 75 MG tablet Take 75 mg by mouth as needed for heartburn.    . sertraline (ZOLOFT) 50 MG tablet Take 1 tablet (50  mg total) by mouth daily. 30 tablet 1  . Vitamin D, Ergocalciferol, (DRISDOL) 50000 units CAPS capsule Take 1 capsule (50,000 Units total) by mouth every 7 (seven) days. 16 capsule 0  . acetaminophen (TYLENOL) 500 MG tablet Take 2 tablets (1,000 mg total) by mouth every 8 (eight) hours as needed. (Patient not taking: Reported on 05/25/2018) 40 tablet 0  . linaclotide (LINZESS) 145 MCG CAPS capsule Take 1 capsule (145 mcg total) by mouth daily before breakfast. (Patient not taking: Reported on 05/25/2018) 30 capsule 0  . norgestimate-ethinyl estradiol (ORTHO-CYCLEN,SPRINTEC,PREVIFEM) 0.25-35 MG-MCG tablet Take 1 tablet by mouth daily. 2 pills a day until no bleeding then 1 daily. 2 Package 5  . omeprazole (PRILOSEC) 40 MG capsule Take 1 capsule (40 mg total) by mouth daily. (Patient  not taking: Reported on 05/24/2018) 90 capsule 3  . promethazine (PHENERGAN) 25 MG suppository Place 1 suppository (25 mg total) rectally every 8 (eight) hours as needed for nausea or vomiting. (Patient not taking: Reported on 06/17/2018) 20 each 0   No current facility-administered medications for this visit.     Review of Systems Review of Systems  Constitutional: Negative.   HENT: Negative.   Gastrointestinal: Negative.   Genitourinary: Positive for menstrual problem, pelvic pain (cramps) and vaginal bleeding.    Blood pressure 131/83, pulse 79, height 5\' 10"  (1.778 m), weight 150 lb (68 kg), last menstrual period 06/14/2018.  Physical Exam Physical Exam  Constitutional: She is oriented to person, place, and time. She appears well-developed.  Pulmonary/Chest: Effort normal.  Abdominal: She exhibits no distension.  Neurological: She is alert and oriented to person, place, and time.  Psychiatric: She has a normal mood and affect. Her behavior is normal.    Data Reviewed CLINICAL DATA:  Right lower quadrant pain  EXAM: TRANSABDOMINAL AND TRANSVAGINAL ULTRASOUND OF PELVIS  TECHNIQUE: Both transabdominal and transvaginal ultrasound examinations of the pelvis were performed. Transabdominal technique was performed for global imaging of the pelvis including uterus, ovaries, adnexal regions, and pelvic cul-de-sac. It was necessary to proceed with endovaginal exam following the transabdominal exam to visualize the uterus, endometrium, ovaries and adnexa .  COMPARISON:  CT 04/07/2017  FINDINGS: Uterus  Measurements: 6.3 x 3.2 x 4.1 cm. Two fundal fibroids which are intramural, the largest 1.6 cm anteriorly.  Endometrium  Thickness: 3 mm in thickness.  No focal abnormality visualized.  Right ovary  Measurements: 4.2 x 2.2 x 2.3 cm. Multiple follicles. Normal appearance. No adnexal mass.  Left ovary  Measurements: 3.7 x 1.9 x 2.5 cm. Multiple follicles.  Normal appearance. No adnexal mass.  Other findings  No free fluid.  IMPRESSION: Small uterine fibroids.  No acute findings.   Electronically Signed   By: Rolm Baptise M.D.   On: 04/15/2017 17:00 CBC    Component Value Date/Time   WBC 5.0 04/30/2018 1529   RBC 3.82 (L) 04/30/2018 1529   HGB 11.7 (L) 04/30/2018 1529   HGB 10.8 (L) 04/28/2018 1438   HCT 33.2 (L) 04/30/2018 1529   HCT 31.5 (L) 04/28/2018 1438   PLT 304 04/30/2018 1529   PLT 278 04/28/2018 1438   MCV 86.9 04/30/2018 1529   MCV 87 04/28/2018 1438   MCH 30.6 04/30/2018 1529   MCHC 35.2 04/30/2018 1529   RDW 13.5 04/30/2018 1529   RDW 14.3 04/28/2018 1438   LYMPHSABS 2.0 04/28/2018 1438   MONOABS 0.4 08/23/2015 1007   EOSABS 0.0 04/28/2018 1438   BASOSABS 0.0 04/28/2018 1438    Assessment  DUB with small uterine fibroids Start OCP for cycle control    Plan    Sprintec continuous for 2 packs. She should take 2 a day until bleeding stops then one daily  3 months RTC    [name] 06/17/2018, 9:42 AM

## 2018-06-18 ENCOUNTER — Ambulatory Visit: Payer: No Typology Code available for payment source | Admitting: Physical Therapy

## 2018-06-22 ENCOUNTER — Ambulatory Visit: Payer: No Typology Code available for payment source | Admitting: Physical Therapy

## 2018-06-24 ENCOUNTER — Ambulatory Visit
Payer: No Typology Code available for payment source | Attending: Physical Medicine & Rehabilitation | Admitting: Physical Therapy

## 2018-06-24 DIAGNOSIS — G8929 Other chronic pain: Secondary | ICD-10-CM | POA: Insufficient documentation

## 2018-06-24 DIAGNOSIS — M542 Cervicalgia: Secondary | ICD-10-CM | POA: Insufficient documentation

## 2018-06-24 DIAGNOSIS — M545 Low back pain: Secondary | ICD-10-CM | POA: Insufficient documentation

## 2018-06-24 DIAGNOSIS — R252 Cramp and spasm: Secondary | ICD-10-CM | POA: Insufficient documentation

## 2018-06-24 NOTE — Therapy (Signed)
[facility], [place], [address] Phone: [phone]   Fax:  [phone]  Physical Therapy Treatment  Patient Details  Name: [name] MRN: [id number] Date of Birth: [birth year] Referring Provider: Alysia Penna   Encounter Date: 06/24/2018  PT End of Session - 06/24/18 1016    Visit Number  7    Number of Visits  24    Date for PT Re-Evaluation  07/19/18    PT Start Time  0940    PT Stop Time  1029    PT Time Calculation (min)  49 min    Activity Tolerance  Patient limited by pain    Behavior During Therapy  Anxious       Past Medical History:  Diagnosis Date  . Allergic rhinitis 01/24/2015  . Arrhythmia Dx 2015  . Back pain   . Breast mass   . Common migraine 04/15/2017  . Depression Dx 2000  . Fibromyalgia   . GERD (gastroesophageal reflux disease) 03/13/2015  . Headache    migraines  . Heart murmur Dx 2015  . 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x [address]  . Lumbar radicular pain 11/22/2014  . Lumbar radiculopathy   . Pain disorder 08/29/2017  . Panic attack Dx 2006  . Positive depression screening 08/29/2017  . POTS (postural orthostatic tachycardia syndrome) 03/24/2014  . PTSD (post-traumatic stress disorder) Dx 2013  . Seizures ([place]) 2001  . Vitamin D deficiency 01/24/2015    Past Surgical History:  Procedure Laterality Date  . ESOPHAGOGASTRODUODENOSCOPY N/A 08/14/2015   Procedure: ESOPHAGOGASTRODUODENOSCOPY (EGD);  Surgeon: Carol Ada, MD;  Location: [facility];  Service: Endoscopy;  Laterality: N/A;    There were no vitals filed for this visit.  Subjective Assessment - 06/24/18 0942    Subjective  Pt stated feeling so-so today. Pt stated she was on way to last appointment and got a cramp in the ribcage and had to cancel.     Currently in Pain?  Yes    Pain Score  8     Pain Location  Rib cage    Pain Orientation  Right                        [facility] Adult PT Treatment/Exercise - 06/24/18 0001      Neck Exercises: Standing   Other Standing Exercises  yellow band SA row 2 x 10 ea      Neck Exercises: Supine   Other Supine Exercise  Chin tuck with diaphragmatic exhale x 10      Lumbar Exercises: Aerobic   UBE (Upper Arm Bike)  L1 4 Min      Lumbar Exercises: Standing   Row  Both;10 reps;Theraband yellow single arm row in door      Modalities   Modalities  Electrical Stimulation;Moist Heat      Moist Heat Therapy   Number Minutes Moist Heat  15 Minutes    Moist Heat Location  Cervical      Electrical Stimulation   Electrical Stimulation Location  cervical    Electrical Stimulation Action  IFC    Electrical Stimulation Goals  Pain      Manual Therapy   Manual Therapy  Manual Traction;Passive ROM    Passive ROM  rotation    Manual Traction  cervical               PT  Short Term Goals - 06/17/18 1145      PT SHORT TERM GOAL #1   Baseline  able to and doing all exercises but depending on the day she has increased pain as result     Status  Achieved        PT Long Term Goals - 05/24/18 1236      PT LONG TERM GOAL #1   Title  Patient to report decreased pain overall by 60% or more with ADLS.    Time  8    Period  Weeks    Status  New    Target Date  07/19/18      PT LONG TERM GOAL #2   Title  Patient to demo improved cervical rotation to [facility] to normalize ADLs.    Time  8    Period  Weeks    Status  New      PT LONG TERM GOAL #3   Title  Patient able to tolerate standing and walkiing without the use of back brace.    Time  8    Period  Weeks    Status  New      PT LONG TERM GOAL #4   Title  Patient to report improved function overall by 50% or more.    Time  8    Period  Weeks    Status  New            Plan - 06/24/18 1021    Clinical Impression Statement  Pt ambulated into clinic with a contorted posture favoring her rip side. Pt reported ribcage  bruising and discomfort, but stated she had done nothing to the area to cause the trouble. Pt stated she had documented pictures and sent them to her doctor in [place]. [facility]ob[facility]. Pt stated that she noticed a benefit from manual cervical traction. Pt required verbal and tactile cues to perform theraband row exercise with erect posture and without cranking rotary motion. Pt was not able to rotate cervical spine or able to tolerate PROM to the area. Pt expressed discomfort with all activity and movements except for modalities at end of treatment.     PT Frequency  3x / week    PT Duration  8 weeks    PT Treatment/Interventions  ADLs/Self Care Home Management;Cryotherapy;Moist Heat;Ultrasound;Neuromuscular re-education;Therapeutic exercise;Patient/family education;Manual techniques;Taping;Dry needling;Scar mobilization;Passive range of motion    PT Next Visit Plan  posture, cervical retraction and ROM (passive and active); AAROM for shoulders; lumbar ROM/pelvic tilts; progress to strengtheing as tolerated. Try OP decompression exercises. Estim/heat for pain. Breathing drills for diaphragm.     PT Home Exercise Plan  supine chin tucks, LTR, decompression exercises and cervical isometrics       Patient will benefit from skilled therapeutic intervention in order to improve the following deficits and impairments:  Decreased range of motion, Pain, Decreased activity tolerance, Decreased strength  Visit Diagnosis: Chronic bilateral low back pain, with sciatica presence unspecified  Cervicalgia  Cramp and spasm     Problem List Patient Active Problem List   Diagnosis Date Noted  . DUB (dysfunctional uterine bleeding) 06/17/2018  . Uterine fibroid 01/18/2018  . Cervical radiculopathy 01/01/2018  . Pain disorder 08/29/2017  . Positive depression screening 08/29/2017  . Neck pain without injury 08/24/2017  . Chronic fatigue 05/15/2017   . Common migraine 04/15/2017  . Axillary mass, right 04/09/2017  . History of bulimia  nervosa 03/13/2015  . GERD (gastroesophageal reflux disease) 03/13/2015  . SOB (shortness of breath) 03/13/2015  . H/O vitamin D deficiency 03/13/2015  . Health care maintenance 01/25/2015  . Chronic tension type headache 01/25/2015  . Muscle pain, fibromyalgia 01/24/2015  . Allergic rhinitis 01/24/2015  . Lumbar radicular pain 11/22/2014  . Thoracic neuralgia 11/22/2014  . Back pain   . Hemoglobin AC 07/03/2014  . POTS (postural orthostatic tachycardia syndrome) 03/24/2014  . Abdominal pain 06/06/2012  . Chronic pain 06/06/2012  . Depression 06/06/2012  . HEMOPTYSIS UNSPECIFIED 12/14/2009  . BREAST TENDERNESS 11/08/2009  . NIPPLE DISCHARGE 11/08/2009  . MENORRHAGIA 11/08/2009  . DISORDER, BIPOLAR NOS 08/06/2007  . PERSONALITY DISORDER 08/06/2007  . MARIJUANA ABUSE 08/06/2007  . NARCOTIC ABUSE 08/06/2007  . SEIZURE DISORDER 08/06/2007  . SOMATIZATION DISORDER 12/27/2005    Maryelizabeth Kaufmann, SPTA 06/24/2018, 10:46 AM  [place], [place], [address] Phone: [phone]   Fax:  [phone]  Name: [name] MRN: [id number] Date of Birth: Sep 07, 1986

## 2018-06-25 ENCOUNTER — Encounter: Payer: No Typology Code available for payment source | Admitting: Physical Therapy

## 2018-06-30 ENCOUNTER — Encounter: Payer: Self-pay | Admitting: Physical Therapy

## 2018-06-30 ENCOUNTER — Ambulatory Visit: Payer: No Typology Code available for payment source | Admitting: Physical Therapy

## 2018-06-30 DIAGNOSIS — G8929 Other chronic pain: Secondary | ICD-10-CM

## 2018-06-30 DIAGNOSIS — R252 Cramp and spasm: Secondary | ICD-10-CM

## 2018-06-30 DIAGNOSIS — M545 Low back pain: Principal | ICD-10-CM

## 2018-06-30 DIAGNOSIS — M542 Cervicalgia: Secondary | ICD-10-CM

## 2018-06-30 NOTE — Therapy (Signed)
[facility] [place], [place], [address] Phone: [phone]   Fax:  [phone]  Physical Therapy Treatment  Patient Details  Name: [name] MRN: [id number] Date of Birth: 1986/02/21 Referring Provider: Alysia Penna   Encounter Date: 06/30/2018  PT End of Session - 06/30/18 1012    Visit Number  8    Date for PT Re-Evaluation  07/19/18    PT Start Time  0930    PT Stop Time  1025    PT Time Calculation (min)  55 min    Activity Tolerance  Patient limited by pain    Behavior During Therapy  Anxious       Past Medical History:  Diagnosis Date  . Allergic rhinitis 01/24/2015  . Arrhythmia Dx 2015  . Back pain   . Breast mass   . Common migraine 04/15/2017  . Depression Dx 2000  . Fibromyalgia   . GERD (gastroesophageal reflux disease) 03/13/2015  . Headache    migraines  . Heart murmur Dx 2015  . 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 Hypertension Dx 69629  . Lumbar radicular pain 11/22/2014  . Lumbar radiculopathy   . Pain disorder 08/29/2017  . Panic attack Dx 2006  . Positive depression screening 08/29/2017  . POTS (postural orthostatic tachycardia syndrome) 03/24/2014  . PTSD (post-traumatic stress disorder) Dx 2013  . Seizures ([place]) 2001  . Vitamin D deficiency 01/24/2015    Past Surgical History:  Procedure Laterality Date  . ESOPHAGOGASTRODUODENOSCOPY N/A 08/14/2015   Procedure: ESOPHAGOGASTRODUODENOSCOPY (EGD);  Surgeon: Carol Ada, MD;  Location: [facility];  Service: Endoscopy;  Laterality: N/A;    There were no vitals filed for this visit.  Subjective Assessment - 06/30/18 0936    Subjective  "So so today"    Currently in Pain?  Yes    Pain Score  9     Pain Location  Back    Pain Orientation  Mid                       [facility] Adult PT Treatment/Exercise - 06/30/18 0001      Lumbar Exercises: Stretches   Passive Hamstring Stretch   Right;Left;3 reps;10 seconds ~ 30 deg      Lumbar Exercises: Aerobic   UBE (Upper Arm Bike)  L1 4 Min      Lumbar Exercises: Seated   Long Arc Quad on Chair  Both;1 set;10 reps    Hip Flexion on Ball  -- compensation    Sit to Stand  5 reps    Other Seated Lumbar Exercises  HS curls yellow x10     Other Seated Lumbar Exercises  Rows yellow 2x10; seated marches x10 each      Modalities   Modalities  Electrical Stimulation;Moist Heat      Moist Heat Therapy   Number Minutes Moist Heat  15 Minutes    Moist Heat Location  Cervical      Electrical Stimulation   Electrical Stimulation Location  thoracic and lumbar    Electrical Stimulation Action  IFC    Electrical Stimulation Parameters  supine    Electrical Stimulation Goals  Pain      Manual Therapy   Manual Therapy  Manual Traction    Manual Traction  hip and lumbar               PT Short Term Goals - 06/17/18  [place] #1   Baseline  able to and doing all exercises but depending on the day she has increased pain as result     Status  Achieved        PT Long Term Goals - 06/30/18 1014      PT LONG TERM GOAL #1   Title  Patient to report decreased pain overall by 60% or more with ADLS.    Status  On-going      PT LONG TERM GOAL #2   Title  Patient to demo improved cervical rotation to [facility] to normalize ADLs.    Status  On-going      PT LONG TERM GOAL #3   Title  Patient able to tolerate standing and walkiing without the use of back brace.    Status  Partially Met            Plan - 06/30/18 1013    Clinical Impression Statement  Pt reports pain throughout treatment session with most movements. Row causes back pain, HS curls caused pain behind knee, small marches causes anterior R hip pain. Pt turned sideways when instructed to do sit to stand, but was able to stand without issues from UBE machine at beginning of treatment.    Rehab Potential  Good    PT Frequency  3x / week    PT  Duration  8 weeks    PT Next Visit Plan  posture, cervical retraction and ROM (passive and active); AAROM for shoulders; lumbar ROM/pelvic tilts; progress to strengtheing as tolerated. Try OP decompression exercises. Estim/heat for pain. Breathing drills for diaphragm.        Patient will benefit from skilled therapeutic intervention in order to improve the following deficits and impairments:  Decreased range of motion, Pain, Decreased activity tolerance, Decreased strength  Visit Diagnosis: Chronic bilateral low back pain, with sciatica presence unspecified  Cervicalgia  Cramp and spasm     Problem List Patient Active Problem List   Diagnosis Date Noted  . DUB (dysfunctional uterine bleeding) 06/17/2018  . Uterine fibroid 01/18/2018  . Cervical radiculopathy 01/01/2018  . Pain disorder 08/29/2017  . Positive depression screening 08/29/2017  . Neck pain without injury 08/24/2017  . Chronic fatigue 05/15/2017  . Common migraine 04/15/2017  . Axillary mass, right 04/09/2017  . History of bulimia nervosa 03/13/2015  . GERD (gastroesophageal reflux disease) 03/13/2015  . SOB (shortness of breath) 03/13/2015  . H/O vitamin D deficiency 03/13/2015  . Health care maintenance 01/25/2015  . Chronic tension type headache 01/25/2015  . Muscle pain, fibromyalgia 01/24/2015  . Allergic rhinitis 01/24/2015  . Lumbar radicular pain 11/22/2014  . Thoracic neuralgia 11/22/2014  . Back pain   . Hemoglobin AC 07/03/2014  . POTS (postural orthostatic tachycardia syndrome) 03/24/2014  . Abdominal pain 06/06/2012  . Chronic pain 06/06/2012  . Depression 06/06/2012  . HEMOPTYSIS UNSPECIFIED 12/14/2009  . BREAST TENDERNESS 11/08/2009  . NIPPLE DISCHARGE 11/08/2009  . MENORRHAGIA 11/08/2009  . DISORDER, BIPOLAR NOS 08/06/2007  . PERSONALITY DISORDER 08/06/2007  . MARIJUANA ABUSE 08/06/2007  . NARCOTIC ABUSE 08/06/2007  . SEIZURE DISORDER 08/06/2007  . SOMATIZATION DISORDER 12/27/2005     Scot Jun, PTA 06/30/2018, 10:25 AM  [facility], [place], [address] Phone: [phone]   Fax:  [phone]  Name: [name] MRN: [id number] Date of Birth: 1986-01-14

## 2018-07-01 ENCOUNTER — Ambulatory Visit: Payer: Self-pay

## 2018-07-01 ENCOUNTER — Ambulatory Visit: Payer: Self-pay | Admitting: Physical Medicine & Rehabilitation

## 2018-07-04 ENCOUNTER — Encounter (HOSPITAL_COMMUNITY): Payer: Self-pay | Admitting: Urgent Care

## 2018-07-04 ENCOUNTER — Other Ambulatory Visit: Payer: Self-pay

## 2018-07-04 ENCOUNTER — Ambulatory Visit (HOSPITAL_COMMUNITY)
Admission: EM | Admit: 2018-07-04 | Discharge: 2018-07-04 | Disposition: A | Discharge status: Home / Self Care | Payer: No Typology Code available for payment source | Attending: Urgent Care | Admitting: Urgent Care

## 2018-07-04 DIAGNOSIS — H5711 Ocular pain, right eye: Secondary | ICD-10-CM

## 2018-07-04 DIAGNOSIS — L03213 Periorbital cellulitis: Secondary | ICD-10-CM

## 2018-07-04 MED ORDER — CEFDINIR 300 MG PO CAPS: 300.0000 mg | ORAL_CAPSULE | Freq: Two times a day (BID) | ORAL | 0 refills | Status: DC

## 2018-07-04 MED ORDER — KETOROLAC TROMETHAMINE 60 MG/2ML IM SOLN
60.0000 mg | Freq: Once | INTRAMUSCULAR | Status: AC
  Administered 2018-07-04: 60 mg via INTRAMUSCULAR

## 2018-07-04 MED ORDER — KETOROLAC TROMETHAMINE 60 MG/2ML IM SOLN
INTRAMUSCULAR | Status: AC
  Filled 2018-07-04: qty 2

## 2018-07-04 MED ORDER — MELOXICAM 7.5 MG PO TABS: 7.5000 mg | ORAL_TABLET | Freq: Every day | ORAL | 0 refills | Status: DC

## 2018-07-04 NOTE — Discharge Instructions (Signed)
You may take 500mg Tylenol every 6 hours for pain and inflammation. ° °

## 2018-07-04 NOTE — ED Provider Notes (Signed)
MRN: [id number] DOB: Dec 18, 1985  Subjective:   [name] is a 32 y.o. female presenting for a day history of persistent and worsening right eye pain, eyelid swelling, drainage and eye redness.  Has also had photosensitivity.  Patient reports that the pain is worsening spreading to right side of her face and forehead.  She has tried doing warm compresses.  She did not know what to try over-the-counter.  Denies fever, eye trauma, contact lens use, swimming in [place] or pool lately.  No current facility-administered medications for this encounter.   Current Outpatient Medications:  .  acetaminophen (TYLENOL) 500 MG tablet, Take 2 tablets (1,000 mg total) by mouth every 8 (eight) hours as needed. (Patient not taking: Reported on 05/25/2018), Disp: 40 tablet, Rfl: 0 .  diclofenac sodium (VOLTAREN) 1 % GEL, Apply 2 g topically 4 (four) times daily., Disp: 100 g, Rfl: 1 .  dicyclomine (BENTYL) 20 MG tablet, Take 1 tablet (20 mg total) by mouth 2 (two) times daily as needed for spasms., Disp: 20 tablet, Rfl: 0 .  ferrous sulfate 325 (65 FE) MG tablet, Take 1 tablet (325 mg total) by mouth daily with breakfast., Disp: 90 tablet, Rfl: 3 .  gabapentin (NEURONTIN) 300 MG capsule, Take 1 cap in AM, 2 caps in PM, Disp: 90 capsule, Rfl: 6 .  linaclotide (LINZESS) 145 MCG CAPS capsule, Take 1 capsule (145 mcg total) by mouth daily before breakfast. (Patient not taking: Reported on 05/25/2018), Disp: 30 capsule, Rfl: 0 .  methocarbamol (ROBAXIN) 500 MG tablet, Take 1 tablet (500 mg total) by mouth every 8 (eight) hours as needed., Disp: 60 tablet, Rfl: 2 .  metoprolol succinate (TOPROL-XL) 100 MG 24 hr tablet, Take 1 tablet (100 mg total) by mouth daily., Disp: 30 tablet, Rfl: 3 .  norgestimate-ethinyl estradiol (ORTHO-CYCLEN,SPRINTEC,PREVIFEM) 0.25-35 MG-MCG tablet, Take 1 tablet by mouth daily. 2 pills a day until no bleeding then 1 daily., Disp: 2 Package, Rfl: 5 .  omeprazole (PRILOSEC) 40 MG capsule, Take 1  capsule (40 mg total) by mouth daily. (Patient not taking: Reported on 05/24/2018), Disp: 90 capsule, Rfl: 3 .  ondansetron (ZOFRAN) 4 MG tablet, Take 1 tablet (4 mg total) by mouth every 6 (six) hours., Disp: 12 tablet, Rfl: 0 .  promethazine (PHENERGAN) 25 MG suppository, Place 1 suppository (25 mg total) rectally every 8 (eight) hours as needed for nausea or vomiting. (Patient not taking: Reported on 06/17/2018), Disp: 20 each, Rfl: 0 .  ranitidine (ZANTAC) 75 MG tablet, Take 75 mg by mouth as needed for heartburn., Disp: , Rfl:  .  sertraline (ZOLOFT) 50 MG tablet, Take 1 tablet (50 mg total) by mouth daily., Disp: 30 tablet, Rfl: 1 .  Vitamin D, Ergocalciferol, (DRISDOL) 50000 units CAPS capsule, Take 1 capsule (50,000 Units total) by mouth every 7 (seven) days., Disp: 16 capsule, Rfl: 0   Allergies  Allergen Reactions  . Aspirin Hives    Past Medical History:  Diagnosis Date  . Allergic rhinitis 01/24/2015  . Arrhythmia Dx 2015  . Back pain   . Breast mass   . Common migraine 04/15/2017  . Depression Dx 2000  . Fibromyalgia   . GERD (gastroesophageal reflux disease) 03/13/2015  . Headache    migraines  . Heart murmur Dx 2015  . 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 [place]pert[place] Dx 74[address]  . Lumbar radicular pain 11/22/2014  . Lumbar radiculopathy   . Pain disorder 08/29/2017  . Panic attack Dx  2006  . Positive depression screening 08/29/2017  . POTS (postural orthostatic tachycardia syndrome) 03/24/2014  . PTSD (post-traumatic stress disorder) Dx 2013  . Seizures ([employer]) 2001  . Vitamin D deficiency 01/24/2015     Past Surgical History:  Procedure Laterality Date  . ESOPHAGOGASTRODUODENOSCOPY N/A 08/14/2015   Procedure: ESOPHAGOGASTRODUODENOSCOPY (EGD);  Surgeon: Carol Ada, MD;  Location: [facility];  Service: Endoscopy;  Laterality: N/A;    Objective:   Vitals: BP 109/66   Pulse (!) 110   Temp 98.7 F (37.1 C) (Oral)   Resp 16   Wt 147 lb (66.7 kg)   LMP  06/14/2018 (Exact Date)   SpO2 100%   BMI 21.09 kg/m   Physical Exam  Constitutional: She is oriented to person, place, and time. She appears well-developed and well-nourished.  Eyes: Pupils are equal, round, and reactive to light. EOM are normal. Right eye exhibits discharge (clear, watery). Right eye exhibits no chemosis, no exudate and no hordeolum. No foreign body present in the right eye. Right conjunctiva is injected. Right conjunctiva has no hemorrhage.    Cardiovascular: Normal rate.  Pulmonary/Chest: Effort normal.  Neurological: She is alert and oriented to person, place, and time.  Skin: Skin is warm and dry.  Psychiatric: She has a normal mood and affect.   Assessment and Plan :   Preseptal cellulitis of right lower eyelid  Preseptal cellulitis of right upper eyelid  Acute right eye pain  Will manage patient for preseptal cellulitis with Cedinir.  Patient has allergies of hives with aspirin.  However she has taken ibuprofen without any reactions.  We used IM Toradol in clinic today.  Patient is to schedule Tylenol and also use meloxicam at home for pain and inflammation.  ER precautions reviewed.  Otherwise patient is can follow-up in 2 days here.     Jaynee Eagles, PA-C 07/04/18 1248

## 2018-07-04 NOTE — ED Triage Notes (Signed)
5 days a facial pain and right eye drainage.

## 2018-07-07 ENCOUNTER — Ambulatory Visit: Payer: No Typology Code available for payment source | Admitting: Physical Therapy

## 2018-07-07 ENCOUNTER — Encounter: Payer: No Typology Code available for payment source | Admitting: Physical Therapy

## 2018-07-07 NOTE — Progress Notes (Signed)
Patient ID: [name], female   DOB: 20-Mar-1986, 32 y.o.   MRN: [id number]  After being seen and treated in ED 8/11 and 07/07/2018 for pre-septal cellulitis.    From ED A/P 07/04/2018: Will manage patient for preseptal cellulitis with Cedinir.  Patient has allergies of hives with aspirin.  However she has taken ibuprofen without any reactions.  We used IM Toradol in clinic today.  Patient is to schedule Tylenol and also use meloxicam at home for pain and inflammation.  ER precautions reviewed.  Otherwise patient is can follow-up in 2 days here.

## 2018-07-08 ENCOUNTER — Ambulatory Visit: Payer: Self-pay | Attending: Family Medicine | Admitting: Physician Assistant

## 2018-07-08 VITALS — BP 120/81 | HR 103 | Temp 98.8°F | Resp 16 | Ht 70.0 in | Wt 151.0 lb

## 2018-07-08 DIAGNOSIS — R5383 Other fatigue: Secondary | ICD-10-CM | POA: Insufficient documentation

## 2018-07-08 DIAGNOSIS — G8929 Other chronic pain: Secondary | ICD-10-CM | POA: Insufficient documentation

## 2018-07-08 DIAGNOSIS — F4310 Post-traumatic stress disorder, unspecified: Secondary | ICD-10-CM | POA: Insufficient documentation

## 2018-07-08 DIAGNOSIS — Z79899 Other long term (current) drug therapy: Secondary | ICD-10-CM | POA: Insufficient documentation

## 2018-07-08 DIAGNOSIS — M5416 Radiculopathy, lumbar region: Secondary | ICD-10-CM | POA: Insufficient documentation

## 2018-07-08 DIAGNOSIS — Z09 Encounter for follow-up examination after completed treatment for conditions other than malignant neoplasm: Secondary | ICD-10-CM | POA: Insufficient documentation

## 2018-07-08 DIAGNOSIS — L03213 Periorbital cellulitis: Secondary | ICD-10-CM | POA: Insufficient documentation

## 2018-07-08 NOTE — Progress Notes (Signed)
Patient stated she is here for her back pain.  Patient stated she only want to see her primary care Doctor not anyone else regarding about her Fibromyalgia.

## 2018-07-08 NOTE — Progress Notes (Signed)
Patient ID: [name], female   DOB: 09/12/1986, 32 y.o.   MRN: [id number]      [name], is a 32 y.o. female  ZHY:[id number]  EXB:[id number]  DOB - 17-Oct-1986  Subjective:  Chief Complaint and HPI: [name] is a 32 y.o. female here today for multiple issues.  Ongoing issues with chronic pain.  Prefers natural remedies.   Long-time Fatigue.  Sees chiropractor and PT.  Also sees neuropsychologist.  Dr Trecia Rogers diagnosed her with fibromyalgia.   Also had low vitamin D and took high dose once/week for 7 weeks.    Recent ED/UC visit on 07/04/2018 for R eye swelling and pain.  Diagnosed with preseptal cellulitis and was put on cefdinir.  She is doing much better.  No fever.  Much less swelling.    ROS:   Constitutional:  No f/c, No night sweats, No unexplained weight loss. EENT:  No vision changes, No blurry vision, No hearing changes. No mouth, throat, or ear problems.  Respiratory: No cough, No SOB Cardiac: No CP, no palpitations GI:  No abd pain, No N/V/D. GU: No Urinary s/sx Musculoskeletal: all over body pain Neuro: No headache, no dizziness, no motor weakness.  Skin: No rash Endocrine:  No polydipsia. No polyuria.  Psych: Denies SI/HI  No problems updated.  ALLERGIES: Allergies  Allergen Reactions  . Aspirin Hives    PAST MEDICAL HISTORY: Past Medical History:  Diagnosis Date  . Allergic rhinitis 01/24/2015  . Arrhythmia Dx 2015  . Back pain   . Breast mass   . Common migraine 04/15/2017  . Depression Dx 2000  . Fibromyalgia   . GERD (gastroesophageal reflux disease) 03/13/2015  . Headache    migraines  . Heart murmur Dx 2015  . 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 [place]pert[place] Dx 10272  . Lumbar radicular pain 11/22/2014  . Lumbar radiculopathy   . Pain disorder 08/29/2017  . Panic attack Dx 2006  . Positive depression screening 08/29/2017  . POTS (postural orthostatic tachycardia syndrome) 03/24/2014  . PTSD (post-traumatic stress  disorder) Dx 2013  . Seizures ([facility]) 2001  . Vitamin D deficiency 01/24/2015    MEDICATIONS AT HOME: Prior to Admission medications   Medication Sig Start Date End Date Taking? Authorizing Provider  diclofenac sodium (VOLTAREN) 1 % GEL Apply 2 g topically 4 (four) times daily. 05/25/18  Yes Marcine Matar, MD  ferrous sulfate 325 (65 FE) MG tablet Take 1 tablet (325 mg total) by mouth daily with breakfast. 04/29/18  Yes Dollye Glasser M, PA-C  gabapentin (NEURONTIN) 300 MG capsule Take 1 cap in AM, 2 caps in PM 03/23/18  Yes Van Clines, MD  metoprolol succinate (TOPROL-XL) 100 MG 24 hr tablet Take 1 tablet (100 mg total) by mouth daily. 01/01/18  Yes Hoy Register, MD  ranitidine (ZANTAC) 75 MG tablet Take 75 mg by mouth as needed for heartburn.   Yes [provider]  sertraline (ZOLOFT) 50 MG tablet Take 1 tablet (50 mg total) by mouth daily. 06/01/18  Yes Kirsteins, Victorino Sparrow, MD  Vitamin D, Ergocalciferol, (DRISDOL) 50000 units CAPS capsule Take 1 capsule (50,000 Units total) by mouth every 7 (seven) days. 04/29/18  Yes Anders Simmonds, PA-C  acetaminophen (TYLENOL) 500 MG tablet Take 2 tablets (1,000 mg total) by mouth every 8 (eight) hours as needed. Patient not taking: Reported on 05/25/2018 11/06/17   Lizbeth Bark, FNP  cefdinir (OMNICEF) 300 MG capsule Take 1 capsule (300  mg total) by mouth 2 (two) times daily. Patient not taking: Reported on 07/08/2018 07/04/18   Wallis Bamberg, PA-C  dicyclomine (BENTYL) 20 MG tablet Take 1 tablet (20 mg total) by mouth 2 (two) times daily as needed for spasms. Patient not taking: Reported on 07/08/2018 01/15/18   Terrilee Files, MD  linaclotide [facility]) 145 MCG CAPS capsule Take 1 capsule (145 mcg total) by mouth daily before breakfast. Patient not taking: Reported on 05/25/2018 04/06/18   Hilarie Fredrickson, MD  meloxicam (MOBIC) 7.5 MG tablet Take 1 tablet (7.5 mg total) by mouth daily. Patient not taking: Reported on 07/08/2018 07/04/18   Wallis Bamberg, PA-C  methocarbamol (ROBAXIN) 500 MG tablet Take 1 tablet (500 mg total) by mouth every 8 (eight) hours as needed. Patient not taking: Reported on 07/08/2018 01/01/18   Hoy Register, MD  norgestimate-ethinyl estradiol (ORTHO-CYCLEN,SPRINTEC,PREVIFEM) 0.25-35 MG-MCG tablet Take 1 tablet by mouth daily. 2 pills a day until no bleeding then 1 daily. Patient not taking: Reported on 07/08/2018 06/17/18   Adam Phenix, MD  omeprazole (PRILOSEC) 40 MG capsule Take 1 capsule (40 mg total) by mouth daily. Patient not taking: Reported on 05/24/2018 04/06/18   Hilarie Fredrickson, MD  ondansetron (ZOFRAN) 4 MG tablet Take 1 tablet (4 mg total) by mouth every 6 (six) hours. Patient not taking: Reported on 07/08/2018 01/15/18   Terrilee Files, MD  promethazine (PHENERGAN) 25 MG suppository Place 1 suppository (25 mg total) rectally every 8 (eight) hours as needed for nausea or vomiting. Patient not taking: Reported on 06/17/2018 01/18/18   Hoy Register, MD     Objective:  EXAM:   Vitals:   07/08/18 0943  BP: 120/81  Pulse: (!) 103  Resp: 16  Temp: 98.8 F (37.1 C)  TempSrc: Oral  SpO2: 99%  Weight: 151 lb (68.5 kg)  Height: 5\' 10"  (1.778 m)    General appearance : A&OX3. NAD. Non-toxic-appearing HEENT: Atraumatic and Normocephalic.  PERRLA. EOM intact.  No sign of entrapment.  There is minimal swelling of the R upper and lower eyelid.  No erythema of the conjunctive.  Fundi benign B. L eye WNL. TM clear B. Mouth-MMM, post pharynx WNL w/o erythema, No PND. Neck: supple, no JVD. No cervical lymphadenopathy. No thyromegaly Chest/Lungs:  Breathing-non-labored, Good air entry bilaterally, breath sounds normal without rales, rhonchi, or wheezing  CVS: S1 S2 regular, no murmurs, gallops, rubs  Extremities: Bilateral Lower Ext shows no edema, both legs are warm to touch with = pulse throughout Neurology:  CN II-XII grossly intact, Non focal.   Psych:  TP linear. J/I WNL. Normal speech. Appropriate  eye contact and affect.  Skin:  No Rash  Data Review No results found for: HGBA1C   Assessment & Plan   1. Fatigue, unspecified type - Vitamin D, 25-hydroxy - Vitamin B12 - Folate  2. Preseptal cellulitis Improving.  To ED if s/sx of entrapment or fever.  Continue cefdinir.    3. Encounter for examination following treatment at hospital Much improved.    Follow-up with Dr Trecia Rogers as planned.  Spent >30 mins face to face with patient counseling and reviewing previous labs and notes.     Patient have been counseled extensively about nutrition and exercise  Return in about 6 weeks (around 08/19/2018) for Dr Alvis Lemmings for fatigue and ?fibromyalgia.  The patient was given clear instructions to go to ER or return to medical center if symptoms don't improve, worsen or new problems develop. The patient  verbalized understanding. The patient was told to call to get lab results if they haven't heard anything in the next week.     Georgian Co, PA-C [facility] [place], [place] [phone]   07/08/2018, 10:16 AM

## 2018-07-09 LAB — VITAMIN B12: Vitamin B-12: 267 pg/mL (ref 232–1245)

## 2018-07-09 LAB — FOLATE

## 2018-07-09 LAB — VITAMIN D 25 HYDROXY (VIT D DEFICIENCY, FRACTURES): Vit D, 25-Hydroxy: 15.9 ng/mL — ABNORMAL LOW (ref 30.0–100.0)

## 2018-07-10 ENCOUNTER — Other Ambulatory Visit: Payer: Self-pay | Admitting: Physician Assistant

## 2018-07-10 MED ORDER — VITAMIN D (ERGOCALCIFEROL) 1.25 MG (50000 UNIT) PO CAPS: 50000.0000 [IU] | ORAL_CAPSULE | ORAL | 0 refills | Status: DC

## 2018-07-12 ENCOUNTER — Telehealth (INDEPENDENT_AMBULATORY_CARE_PROVIDER_SITE_OTHER): Payer: Self-pay

## 2018-07-12 ENCOUNTER — Ambulatory Visit: Payer: No Typology Code available for payment source | Admitting: Physical Therapy

## 2018-07-12 NOTE — Telephone Encounter (Signed)
-----   Message from Argentina Donovan, [place] sent at 07/10/2018  9:24 AM EDT ----- Please call patient and let them that their vitamin D is low.  This can contribute to muscle aches, anxiety, fatigue, and depression.  I have sent a prescription to the pharmacy for them to take once a week.  We will recheck this level in 3-4 months.  Her B12 level was normal.   Thanks, Freeman Caldron, PA-C

## 2018-07-12 NOTE — Telephone Encounter (Signed)
Patient was called and informed to contact office for lab results. 

## 2018-07-13 ENCOUNTER — Telehealth: Payer: Self-pay

## 2018-07-13 NOTE — Telephone Encounter (Signed)
-----   Message from Argentina Donovan, [place] sent at 07/10/2018  9:24 AM EDT ----- Please call patient and let them that their vitamin D is low.  This can contribute to muscle aches, anxiety, fatigue, and depression.  I have sent a prescription to the pharmacy for them to take once a week.  We will recheck this level in 3-4 months.  Her B12 level was normal.   Thanks, Freeman Caldron, PA-C

## 2018-07-13 NOTE — Telephone Encounter (Signed)
Patient returned phone call and was informed of lab results. 

## 2018-07-14 ENCOUNTER — Encounter: Payer: No Typology Code available for payment source | Admitting: Physical Therapy

## 2018-07-15 ENCOUNTER — Ambulatory Visit: Payer: No Typology Code available for payment source | Admitting: Obstetrics & Gynecology

## 2018-07-15 ENCOUNTER — Encounter
Payer: No Typology Code available for payment source | Attending: Physical Medicine & Rehabilitation | Admitting: Psychology

## 2018-07-15 DIAGNOSIS — I10 Essential (primary) hypertension: Secondary | ICD-10-CM | POA: Insufficient documentation

## 2018-07-15 DIAGNOSIS — Z8249 Family history of ischemic heart disease and other diseases of the circulatory system: Secondary | ICD-10-CM | POA: Insufficient documentation

## 2018-07-15 DIAGNOSIS — F331 Major depressive disorder, recurrent, moderate: Secondary | ICD-10-CM

## 2018-07-15 DIAGNOSIS — Z803 Family history of malignant neoplasm of breast: Secondary | ICD-10-CM | POA: Insufficient documentation

## 2018-07-15 DIAGNOSIS — Z820 Family history of epilepsy and other diseases of the nervous system: Secondary | ICD-10-CM | POA: Insufficient documentation

## 2018-07-15 DIAGNOSIS — R569 Unspecified convulsions: Secondary | ICD-10-CM

## 2018-07-15 DIAGNOSIS — F4312 Post-traumatic stress disorder, chronic: Secondary | ICD-10-CM

## 2018-07-15 DIAGNOSIS — M545 Low back pain, unspecified: Secondary | ICD-10-CM

## 2018-07-15 DIAGNOSIS — F329 Major depressive disorder, single episode, unspecified: Secondary | ICD-10-CM | POA: Insufficient documentation

## 2018-07-15 DIAGNOSIS — F4310 Post-traumatic stress disorder, unspecified: Secondary | ICD-10-CM | POA: Insufficient documentation

## 2018-07-15 DIAGNOSIS — M797 Fibromyalgia: Secondary | ICD-10-CM

## 2018-07-15 DIAGNOSIS — Z9889 Other specified postprocedural states: Secondary | ICD-10-CM | POA: Insufficient documentation

## 2018-07-15 DIAGNOSIS — G8929 Other chronic pain: Secondary | ICD-10-CM

## 2018-07-15 DIAGNOSIS — K219 Gastro-esophageal reflux disease without esophagitis: Secondary | ICD-10-CM | POA: Insufficient documentation

## 2018-07-15 DIAGNOSIS — Z87891 Personal history of nicotine dependence: Secondary | ICD-10-CM | POA: Insufficient documentation

## 2018-07-16 ENCOUNTER — Ambulatory Visit: Payer: No Typology Code available for payment source | Admitting: Physical Therapy
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
## 2018-07-19 ENCOUNTER — Ambulatory Visit: Payer: No Typology Code available for payment source | Admitting: Physical Therapy

## 2018-07-19 ENCOUNTER — Encounter: Payer: Self-pay | Admitting: Physical Therapy

## 2018-07-19 DIAGNOSIS — M545 Low back pain: Principal | ICD-10-CM

## 2018-07-19 DIAGNOSIS — M542 Cervicalgia: Secondary | ICD-10-CM

## 2018-07-19 DIAGNOSIS — R252 Cramp and spasm: Secondary | ICD-10-CM

## 2018-07-19 DIAGNOSIS — G8929 Other chronic pain: Secondary | ICD-10-CM

## 2018-07-19 NOTE — Therapy (Signed)
[facility] [place], [place], [address] Phone: [phone]   Fax:  [phone]  Physical Therapy Treatment  Patient Details  Name: [name] MRN: [id number] Date of Birth: July 20, 1986 Referring Provider: Alysia Penna   Encounter Date: 07/19/2018  PT End of Session - 07/19/18 1425    Visit Number  9    Date for PT Re-Evaluation  07/19/18    PT Start Time  1351    PT Stop Time  1441    PT Time Calculation (min)  50 min    Activity Tolerance  Patient limited by pain    Behavior During Therapy  Anxious       Past Medical History:  Diagnosis Date  . Allergic rhinitis 01/24/2015  . Arrhythmia Dx 2015  . Back pain   . Breast mass   . Common migraine 04/15/2017  . Depression Dx 2000  . Fibromyalgia   . GERD (gastroesophageal reflux disease) 03/13/2015  . Headache    migraines  . Heart murmur Dx 2015  . 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 [place]pert[place] Dx [address]  . Lumbar radicular pain 11/22/2014  . Lumbar radiculopathy   . Pain disorder 08/29/2017  . Panic attack Dx 2006  . Positive depression screening 08/29/2017  . POTS (postural orthostatic tachycardia syndrome) 03/24/2014  . PTSD (post-traumatic stress disorder) Dx 2013  . Seizures ([place]) 2001  . Vitamin D deficiency 01/24/2015    Past Surgical History:  Procedure Laterality Date  . ESOPHAGOGASTRODUODENOSCOPY N/A 08/14/2015   Procedure: ESOPHAGOGASTRODUODENOSCOPY (EGD);  Surgeon: Carol Ada, MD;  Location: [facility];  Service: Endoscopy;  Laterality: N/A;    There were no vitals filed for this visit.  Subjective Assessment - 07/19/18 1355    Subjective  Pt reports going to the MD on the 22, Continues with therapy, and change diet    Currently in Pain?  Yes    Pain Score  8     Pain Location  Neck                       [facility] Adult PT Treatment/Exercise - 07/19/18 0001      Exercises   Exercises   Neck;Lumbar      Neck Exercises: Seated   Other Seated Exercise  yellow tbabd rows 2x10    Other Seated Exercise  band pull aparts yellow 2x10       Lumbar Exercises: Aerobic   UBE (Upper Arm Bike)  L1 5 Min      Lumbar Exercises: Standing   Row  Both;10 reps;Theraband      Lumbar Exercises: Seated   Long Arc Quad on Chair  Both;1 set;10 reps   LLE kneesassist at time due to hip pain    Sit to Stand  5 reps   avoids using LLE much due to L hip pain   Other Seated Lumbar Exercises  HS curls, one finger to no resistants causes pain     Other Seated Lumbar Exercises  ball squeeze l0 little movement from LLe due to hip pain       Modalities   Modalities  Electrical Stimulation;Moist Heat      Moist Heat Therapy   Number Minutes Moist Heat  15 Minutes    Moist Heat Location  Cervical      Electrical Stimulation   Electrical Stimulation Location  thoracic and lumbar    Electrical Stimulation Action  IFC    Electrical Stimulation Parameters  supine    Electrical Stimulation Goals  Pain      Manual Therapy   Manual Therapy  Manual Traction    Manual Traction  cervical spine, very little to no pull               PT Short Term Goals - 06/17/18 1145      PT SHORT TERM GOAL #1   Baseline  able to and doing all exercises but depending on the day she has increased pain as result     Status  Achieved        PT Long Term Goals - 06/30/18 1014      PT LONG TERM GOAL #1   Title  Patient to report decreased pain overall by 60% or more with ADLS.    Status  On-going      PT LONG TERM GOAL #2   Title  Patient to demo improved cervical rotation to [facility] to normalize ADLs.    Status  On-going      PT LONG TERM GOAL #3   Title  Patient able to tolerate standing and walkiing without the use of back brace.    Status  Partially Met            Plan - 07/19/18 1426    Clinical Impression Statement  Pt ~ 6 minutes late for today's PT treatment. No progress towards goals.  Pt reports pain throughout treatment session. Every movement causes an increase in pain. HS curls and LAQ causes hip pain. PT compensated with sit to stands avoid using LLE. Band pull apart's causes anterior R shoulder pain.     Rehab Potential  Fair    PT Frequency  3x / week    PT Duration  8 weeks    PT Treatment/Interventions  ADLs/Self Care Home Management;Cryotherapy;Moist Heat;Ultrasound;Neuromuscular re-education;Therapeutic exercise;Patient/family education;Manual techniques;Taping;Dry needling;Scar mobilization;Passive range of motion    PT Next Visit Plan  posture, cervical retraction and ROM (passive and active); AAROM for shoulders; lumbar ROM/pelvic tilts; progress to strengtheing as tolerated. Try OP decompression exercises. Estim/heat for pain. Breathing drills for diaphragm.        Patient will benefit from skilled therapeutic intervention in order to improve the following deficits and impairments:  Decreased range of motion, Pain, Decreased activity tolerance, Decreased strength  Visit Diagnosis: Chronic bilateral low back pain, with sciatica presence unspecified  Cervicalgia  Cramp and spasm     Problem List Patient Active Problem List   Diagnosis Date Noted  . DUB (dysfunctional uterine bleeding) 06/17/2018  . Uterine fibroid 01/18/2018  . Cervical radiculopathy 01/01/2018  . Pain disorder 08/29/2017  . Positive depression screening 08/29/2017  . Neck pain without injury 08/24/2017  . Chronic fatigue 05/15/2017  . Common migraine 04/15/2017  . Axillary mass, right 04/09/2017  . History of bulimia nervosa 03/13/2015  . GERD (gastroesophageal reflux disease) 03/13/2015  . SOB (shortness of breath) 03/13/2015  . H/O vitamin D deficiency 03/13/2015  . Health care maintenance 01/25/2015  . Chronic tension type headache 01/25/2015  . Muscle pain, fibromyalgia 01/24/2015  . Allergic rhinitis 01/24/2015  . Lumbar radicular pain 11/22/2014  . Thoracic neuralgia  11/22/2014  . Back pain   . Hemoglobin AC 07/03/2014  . POTS (postural orthostatic tachycardia syndrome) 03/24/2014  . Abdominal pain 06/06/2012  . Chronic pain 06/06/2012  . Depression 06/06/2012  . HEMOPTYSIS UNSPECIFIED 12/14/2009  . BREAST TENDERNESS 11/08/2009  . NIPPLE DISCHARGE 11/08/2009  .  MENORRHAGIA 11/08/2009  . DISORDER, BIPOLAR NOS 08/06/2007  . PERSONALITY DISORDER 08/06/2007  . MARIJUANA ABUSE 08/06/2007  . NARCOTIC ABUSE 08/06/2007  . SEIZURE DISORDER 08/06/2007  . SOMATIZATION DISORDER 12/27/2005    Scot Jun, PTA 07/19/2018, 2:30 PM  [facility] [place], [place], [address] Phone: [phone]   Fax:  [phone]  Name: [name] MRN: [id number] Date of Birth: 11-26-1985

## 2018-07-20 ENCOUNTER — Other Ambulatory Visit (HOSPITAL_COMMUNITY): Payer: Self-pay | Admitting: *Deleted

## 2018-07-20 DIAGNOSIS — N6310 Unspecified lump in the right breast, unspecified quadrant: Secondary | ICD-10-CM

## 2018-07-21 ENCOUNTER — Encounter: Payer: No Typology Code available for payment source | Admitting: Physical Therapy

## 2018-07-22 ENCOUNTER — Ambulatory Visit
Admission: RE | Admit: 2018-07-22 | Discharge: 2018-07-22 | Disposition: A | Discharge status: Home / Self Care | Payer: No Typology Code available for payment source | Source: Ambulatory Visit | Attending: Obstetrics and Gynecology | Admitting: Obstetrics and Gynecology

## 2018-07-22 ENCOUNTER — Ambulatory Visit
Admission: RE | Admit: 2018-07-22 | Discharge: 2018-07-22 | Disposition: A | Discharge status: Home / Self Care | Payer: Self-pay | Source: Ambulatory Visit | Attending: Obstetrics and Gynecology | Admitting: Obstetrics and Gynecology

## 2018-07-22 ENCOUNTER — Encounter (HOSPITAL_COMMUNITY): Payer: Self-pay

## 2018-07-22 ENCOUNTER — Ambulatory Visit (HOSPITAL_COMMUNITY)
Admission: RE | Admit: 2018-07-22 | Discharge: 2018-07-22 | Disposition: A | Discharge status: Home / Self Care | Payer: Self-pay | Source: Ambulatory Visit | Attending: Obstetrics and Gynecology | Admitting: Obstetrics and Gynecology

## 2018-07-22 VITALS — BP 110/62 | Ht 70.0 in | Wt 154.0 lb

## 2018-07-22 DIAGNOSIS — Z1239 Encounter for other screening for malignant neoplasm of breast: Secondary | ICD-10-CM

## 2018-07-22 DIAGNOSIS — N644 Mastodynia: Secondary | ICD-10-CM

## 2018-07-22 DIAGNOSIS — N6310 Unspecified lump in the right breast, unspecified quadrant: Secondary | ICD-10-CM

## 2018-07-22 NOTE — Patient Instructions (Addendum)
Explained breast self awareness with [name]. Patient did not need a Pap smear today due to last Pap smear and HPV typing was 03/25/2018. Let patient know [facility] will cover Pap smears and HPV typing every 5 years unless has a history of abnormal Pap smears. Referred patient to the [facility] for a right breast diagnostic mammogram and possible right breast ultrasound. Appointment scheduled for Thursday, July 22, 2018 at 1450. [name] verbalized understanding.  [name], Arvil Chaco, RN 2:46 PM

## 2018-07-22 NOTE — Progress Notes (Signed)
Complaints of new right breast lump and pain x 1-2 weeks ago. Patient states the pain is constant. Patient rates the pain at a 8 out of 10. Patient states she has had a fever, chills, and noticed redness within her right breast.  Pap Smear: Pap smear not completed today. Last Pap smear was 03/25/2018 at [facility] and normal with negative HPV. Per patient has no history of an abnormal Pap smear. Last two Pap smear results are in Epic.  Physical exam: Breasts Breasts symmetrical. No skin abnormalities bilateral breasts. No nipple retraction bilateral breasts. Patient continues to have bilateral clear nipple discharge. Sample of discharge sent to Cytology 03/25/2018 that was benign. No lymphadenopathy. No lumps palpated left breast. Palpated a lump within the right breast at 11 o'clock 5 cm from the nipple. Patient stated that is a new lump around nipple around 6 o'clock right breast. Unable to palpate a lump in patients area of concern. Complaints of right breast pain around 6 o'clock on exam. Referred patient to the [facility] for a right breast diagnostic mammogram and possible right breast ultrasound. Appointment scheduled for Thursday, July 22, 2018 at 1450.       Pelvic/Bimanual No Pap smear completed today since last Pap smear and HPV typing was 03/25/2018. Pap smear not indicated per BCCCP guidelines.   Smoking History: Patient is a former smoker that stated she quit 06/23/2012.  Patient Navigation: Patient education provided. Access to services provided for patient through BCCCP program.   Breast and Cervical Cancer Risk Assessment: Patient has a family history of a first cousin, a paternal aunt, and paternal grandmother being diagnosed with breast cancer. Patient has no known genetic mutations or history of radiation treatment to the chest before age 14. Patient has no history of cervical dysplasia, immunocompromised, or DES exposure in-utero. Breast Cancer risk assessment  completed. No risk calculated due to patient is less than 16 years old.

## 2018-07-27 MED FILL — VIT D2 1.25 MG (50,000 UNIT: 1.25 MG | 28 days supply | Qty: 4 | Fill #0

## 2018-07-28 ENCOUNTER — Ambulatory Visit: Payer: No Typology Code available for payment source | Admitting: Physical Therapy

## 2018-07-29 ENCOUNTER — Encounter
Payer: No Typology Code available for payment source | Attending: Physical Medicine & Rehabilitation | Admitting: Psychology

## 2018-07-29 ENCOUNTER — Telehealth: Payer: Self-pay | Admitting: Internal Medicine

## 2018-07-29 DIAGNOSIS — K219 Gastro-esophageal reflux disease without esophagitis: Secondary | ICD-10-CM | POA: Insufficient documentation

## 2018-07-29 DIAGNOSIS — I10 Essential (primary) hypertension: Secondary | ICD-10-CM | POA: Insufficient documentation

## 2018-07-29 DIAGNOSIS — Z9889 Other specified postprocedural states: Secondary | ICD-10-CM | POA: Insufficient documentation

## 2018-07-29 DIAGNOSIS — Z87891 Personal history of nicotine dependence: Secondary | ICD-10-CM | POA: Insufficient documentation

## 2018-07-29 DIAGNOSIS — M545 Low back pain: Secondary | ICD-10-CM | POA: Insufficient documentation

## 2018-07-29 DIAGNOSIS — F329 Major depressive disorder, single episode, unspecified: Secondary | ICD-10-CM | POA: Insufficient documentation

## 2018-07-29 DIAGNOSIS — R569 Unspecified convulsions: Secondary | ICD-10-CM

## 2018-07-29 DIAGNOSIS — F4312 Post-traumatic stress disorder, chronic: Secondary | ICD-10-CM

## 2018-07-29 DIAGNOSIS — Z820 Family history of epilepsy and other diseases of the nervous system: Secondary | ICD-10-CM | POA: Insufficient documentation

## 2018-07-29 DIAGNOSIS — Z8249 Family history of ischemic heart disease and other diseases of the circulatory system: Secondary | ICD-10-CM | POA: Insufficient documentation

## 2018-07-29 DIAGNOSIS — F331 Major depressive disorder, recurrent, moderate: Secondary | ICD-10-CM

## 2018-07-29 DIAGNOSIS — Z803 Family history of malignant neoplasm of breast: Secondary | ICD-10-CM | POA: Insufficient documentation

## 2018-07-29 DIAGNOSIS — G8929 Other chronic pain: Secondary | ICD-10-CM | POA: Insufficient documentation

## 2018-07-29 DIAGNOSIS — F4310 Post-traumatic stress disorder, unspecified: Secondary | ICD-10-CM | POA: Insufficient documentation

## 2018-07-29 DIAGNOSIS — M797 Fibromyalgia: Secondary | ICD-10-CM | POA: Insufficient documentation

## 2018-07-29 NOTE — Telephone Encounter (Signed)
Patient walk in today to get appointment to see Dr. Caryl Comes.   Spoke with triage and was ask to send a message to you  to call due to the patient has dysautonomic. Patient was seen today by Phys. Medicaine and was told she needed to see Dr. Caryl Comes regarding problems she has been having with swelling and sob.

## 2018-08-02 ENCOUNTER — Telehealth: Payer: Self-pay | Admitting: Internal Medicine

## 2018-08-02 ENCOUNTER — Ambulatory Visit: Payer: No Typology Code available for payment source | Admitting: Physical Therapy

## 2018-08-02 NOTE — Telephone Encounter (Signed)
Pt states she is still having issues with abdominal discomfort and bloating. Pt scheduled to see Nicoletta Ba PA tomorrow at 8:30am. Pt aware of appt.

## 2018-08-02 NOTE — Telephone Encounter (Signed)
Spoke with pt today. She states she has right facial droop, weakness in her right upper extremity, SOB, CP, and has passed out. She would like to be seen by Dr Caryl Comes asap. I have advised pt there is no availabilty at this time. I strongly advised her to go to the ED to seek care with her current symptoms. She should be ruled out for stroke and heart issues. Pt has verbalized understanding and agreed with this and will go to the ED.

## 2018-08-02 NOTE — Telephone Encounter (Addendum)
° °  1. Are you having CP right now? No  2. Are you experiencing any other symptoms (ex. SOB, nausea, vomiting, sweating)? n/a  3. How long have you been experiencing CP? 1 month   4. Is your CP continuous or coming and going? Coming and going  5. Have you taken Nitroglycerin? NO ?    1. Are you currently SOB (can you hear that pt is SOB on the phone)? No  2. How long have you been experiencing SOB? 1 month, getting worse  3. Are you SOB when sitting or when up moving around? Moving around  4. Are you currently experiencing any other symptoms? Left arm pain, chest pain, anxiety, palpitations, fainted     1. Did you pass out today? No  2. When is the last time you passed out? Sunday 9/8  3. Has this occurred multiple times? yes  4. Did you have any symptoms prior to passing out? Seeing spots in vision

## 2018-08-03 ENCOUNTER — Ambulatory Visit: Payer: No Typology Code available for payment source | Admitting: Physical Therapy

## 2018-08-03 ENCOUNTER — Other Ambulatory Visit (INDEPENDENT_AMBULATORY_CARE_PROVIDER_SITE_OTHER): Payer: PRIVATE HEALTH INSURANCE

## 2018-08-03 ENCOUNTER — Encounter: Payer: Self-pay | Admitting: Physician Assistant

## 2018-08-03 ENCOUNTER — Ambulatory Visit (INDEPENDENT_AMBULATORY_CARE_PROVIDER_SITE_OTHER)
Admission: RE | Admit: 2018-08-03 | Discharge: 2018-08-03 | Disposition: A | Discharge status: Home / Self Care | Payer: PRIVATE HEALTH INSURANCE | Source: Ambulatory Visit | Attending: Physician Assistant | Admitting: Physician Assistant

## 2018-08-03 ENCOUNTER — Ambulatory Visit (INDEPENDENT_AMBULATORY_CARE_PROVIDER_SITE_OTHER): Payer: No Typology Code available for payment source | Admitting: Physician Assistant

## 2018-08-03 VITALS — BP 90/60 | HR 100 | Ht 70.0 in | Wt 153.3 lb

## 2018-08-03 DIAGNOSIS — R140 Abdominal distension (gaseous): Secondary | ICD-10-CM

## 2018-08-03 DIAGNOSIS — K219 Gastro-esophageal reflux disease without esophagitis: Secondary | ICD-10-CM

## 2018-08-03 DIAGNOSIS — R1010 Upper abdominal pain, unspecified: Secondary | ICD-10-CM

## 2018-08-03 DIAGNOSIS — K5900 Constipation, unspecified: Secondary | ICD-10-CM

## 2018-08-03 LAB — COMPREHENSIVE METABOLIC PANEL
ALT: 13 U/L (ref 0–35)
AST: 16 U/L (ref 0–37)
Albumin: 4.1 g/dL (ref 3.5–5.2)
Alkaline Phosphatase: 68 U/L (ref 39–117)
BUN: 12 mg/dL (ref 6–23)
CO2: 29 mEq/L (ref 19–32)
Calcium: 9.1 mg/dL (ref 8.4–10.5)
Chloride: 106 mEq/L (ref 96–112)
Creatinine, Ser: 0.64 mg/dL (ref 0.40–1.20)
GFR: 137.38 mL/min (ref >60.00)
Glucose, Bld: 86 mg/dL (ref 70–99)
Potassium: 3.9 mEq/L (ref 3.5–5.1)
Sodium: 139 mEq/L (ref 135–145)
Total Bilirubin: 0.4 mg/dL (ref 0.2–1.2)
Total Protein: 7 g/dL (ref 6.0–8.3)

## 2018-08-03 LAB — SEDIMENTATION RATE: Sed Rate: 11 mm/hr (ref 0–20)

## 2018-08-03 LAB — CBC WITH DIFFERENTIAL/PLATELET
Basophils Absolute: 0 10*3/uL (ref 0.0–0.1)
Basophils Relative: 0.7 % (ref 0.0–3.0)
Eosinophils Absolute: 0 10*3/uL (ref 0.0–0.7)
Eosinophils Relative: 0.4 % (ref 0.0–5.0)
HCT: 31.2 % — ABNORMAL LOW (ref 36.0–46.0)
Hemoglobin: 10.2 g/dL — ABNORMAL LOW (ref 12.0–15.0)
Lymphocytes Relative: 38.2 % (ref 12.0–46.0)
Lymphs Abs: 1.9 10*3/uL (ref 0.7–4.0)
MCHC: 32.8 g/dL (ref 30.0–36.0)
MCV: 86.2 fl (ref 78.0–100.0)
Monocytes Absolute: 0.3 10*3/uL (ref 0.1–1.0)
Monocytes Relative: 5.7 % (ref 3.0–12.0)
Neutro Abs: 2.7 10*3/uL (ref 1.4–7.7)
Neutrophils Relative %: 55 % (ref 43.0–77.0)
Platelets: 250 10*3/uL (ref 150.0–400.0)
RBC: 3.62 Mil/uL — ABNORMAL LOW (ref 3.87–5.11)
RDW: 17.1 % — ABNORMAL HIGH (ref 11.5–15.5)
WBC: 4.8 10*3/uL (ref 4.0–10.5)

## 2018-08-03 LAB — LIPASE: Lipase: 16 U/L (ref 11.0–59.0)

## 2018-08-03 NOTE — Progress Notes (Signed)
Subjective:    Patient ID: [name], female    DOB: Jul 14, 1986, 32 y.o.   MRN: [id number]  HPI [name] is a pleasant 32 year old African-American female, known to Dr. Henrene Pastor.  She was seen in the office in May 2019 with complaints of abdominal pain.  She had been referred by Jerilynn Som.  Patient has history of POTS, migraines, GERD, history of eating disorder, history of narcotic abuse, bipolar disorder personality disorder and somatization disorder. She had had prior EGD by Dr. Benson Norway in 2016 which was for the most part unremarkable.  CT of the abdomen and pelvis in March 2019 was negative.  She had complained of constipation and upper abdominal discomfort and bloating.  It was felt her symptoms were consistent with GERD and IBS.  She was started on omeprazole 40 mg p.o. daily and Linzess 145 mcg daily. Last labs in June 2019 hemoglobin 10.8 MCV of 87. She comes in today stating that she is having ongoing abdominal swelling and distention and tightness which is in the upper abdomen.  She has symptoms on a daily basis and generally wakes up feeling distended and uncomfortable sometimes to the point that she feels short of breath.  Her symptoms will generally last throughout the day.  Occasionally if she is able to pass gas or have bowel movement symptoms will improve somewhat.  She feels that she is eating less though her weight is stable.  She does have early satiety type symptoms ongoing heartburn and indigestion.  She is not taking the omeprazole regularly, she and her friend said they have read about PPIs and she would like to avoid PPIs if possible or switch to another drug.  She has been using Zantac on her 50 mg twice daily.  She has been taking the Linzess and says this works pretty well. She has a prescription for Mobic which she is not taking daily and is not taking dicyclomine which is also on her list.   She is not currently on any narcotics.  Review of Systems Pertinent positive  and negative review of systems were noted in the above HPI section.  All other review of systems was otherwise negative.  Outpatient Encounter Medications as of 08/03/2018  Medication Sig  . diclofenac sodium (VOLTAREN) 1 % GEL Apply 2 g topically 4 (four) times daily.  Marland Kitchen gabapentin (NEURONTIN) 300 MG capsule Take 1 cap in AM, 2 caps in PM  . methocarbamol (ROBAXIN) 500 MG tablet Take 1 tablet (500 mg total) by mouth every 8 (eight) hours as needed.  . metoprolol succinate (TOPROL-XL) 100 MG 24 hr tablet Take 1 tablet (100 mg total) by mouth daily.  . norgestimate-ethinyl estradiol (ORTHO-CYCLEN,SPRINTEC,PREVIFEM) 0.25-35 MG-MCG tablet Take 1 tablet by mouth daily. 2 pills a day until no bleeding then 1 daily.  . ondansetron (ZOFRAN) 4 MG tablet Take 1 tablet (4 mg total) by mouth every 6 (six) hours.  . Prenatal Vit-Fe Fumarate-FA (PRENATAL VITAMIN PO) Take 1 tablet by mouth daily.  . ranitidine (ZANTAC) 75 MG tablet Take 75 mg by mouth as needed for heartburn.  . sertraline (ZOLOFT) 50 MG tablet Take 1 tablet (50 mg total) by mouth daily.  . Vitamin D, Ergocalciferol, (DRISDOL) 50000 units CAPS capsule Take 1 capsule (50,000 Units total) by mouth every 7 (seven) days.  . meloxicam (MOBIC) 7.5 MG tablet Take 1 tablet (7.5 mg total) by mouth daily. (Patient not taking: Reported on 07/08/2018)  . [DISCONTINUED] acetaminophen (TYLENOL) 500 MG tablet Take 2  tablets (1,000 mg total) by mouth every 8 (eight) hours as needed. (Patient not taking: Reported on 05/25/2018)  . [DISCONTINUED] cefdinir (OMNICEF) 300 MG capsule Take 1 capsule (300 mg total) by mouth 2 (two) times daily. (Patient not taking: Reported on 07/08/2018)  . [DISCONTINUED] dicyclomine (BENTYL) 20 MG tablet Take 1 tablet (20 mg total) by mouth 2 (two) times daily as needed for spasms. (Patient not taking: Reported on 07/08/2018)  . [DISCONTINUED] ferrous sulfate 325 (65 FE) MG tablet Take 1 tablet (325 mg total) by mouth daily with breakfast.  (Patient not taking: Reported on 07/22/2018)  . [DISCONTINUED] linaclotide (LINZESS) 145 MCG CAPS capsule Take 1 capsule (145 mcg total) by mouth daily before breakfast. (Patient not taking: Reported on 05/25/2018)  . [DISCONTINUED] omeprazole (PRILOSEC) 40 MG capsule Take 1 capsule (40 mg total) by mouth daily. (Patient not taking: Reported on 05/24/2018)  . [DISCONTINUED] promethazine (PHENERGAN) 25 MG suppository Place 1 suppository (25 mg total) rectally every 8 (eight) hours as needed for nausea or vomiting. (Patient not taking: Reported on 07/22/2018)   No facility-administered encounter medications on file as of 08/03/2018.    Allergies  Allergen Reactions  . Aspirin Hives   Patient Active Problem List   Diagnosis Date Noted  . DUB (dysfunctional uterine bleeding) 06/17/2018  . Uterine fibroid 01/18/2018  . Cervical radiculopathy 01/01/2018  . Pain disorder 08/29/2017  . Positive depression screening 08/29/2017  . Neck pain without injury 08/24/2017  . Chronic fatigue 05/15/2017  . Common migraine 04/15/2017  . Axillary mass, right 04/09/2017  . History of bulimia nervosa 03/13/2015  . GERD (gastroesophageal reflux disease) 03/13/2015  . SOB (shortness of breath) 03/13/2015  . H/O vitamin D deficiency 03/13/2015  . Health care maintenance 01/25/2015  . Chronic tension type headache 01/25/2015  . Muscle pain, fibromyalgia 01/24/2015  . Allergic rhinitis 01/24/2015  . Lumbar radicular pain 11/22/2014  . Thoracic neuralgia 11/22/2014  . Back pain   . Hemoglobin AC 07/03/2014  . POTS (postural orthostatic tachycardia syndrome) 03/24/2014  . Abdominal pain 06/06/2012  . Chronic pain 06/06/2012  . Depression 06/06/2012  . HEMOPTYSIS UNSPECIFIED 12/14/2009  . BREAST TENDERNESS 11/08/2009  . NIPPLE DISCHARGE 11/08/2009  . MENORRHAGIA 11/08/2009  . DISORDER, BIPOLAR NOS 08/06/2007  . PERSONALITY DISORDER 08/06/2007  . MARIJUANA ABUSE 08/06/2007  . NARCOTIC ABUSE 08/06/2007  .  SEIZURE DISORDER 08/06/2007  . SOMATIZATION DISORDER 12/27/2005   Social History   Socioeconomic History  . Marital status: Single    Spouse name: Not on file  . Number of children: Not on file  . Years of education: 85  . Highest education level: Not on file  Occupational History  . Not on file  Social Needs  . Financial resource strain: Not on file  . Food insecurity:    Worry: Not on file    Inability: Not on file  . Transportation needs:    Medical: Not on file    Non-medical: Not on file  Tobacco Use  . Smoking status: Former Smoker    Last attempt to quit: 06/23/2012    Years since quitting: 6.1  . Smokeless tobacco: Never Used  Substance and Sexual Activity  . Alcohol use: Not Currently    Comment: rarely  . Drug use: No  . Sexual activity: Not Currently    Birth control/protection: None    Comment: last 2010  Lifestyle  . Physical activity:    Days per week: Not on file    Minutes per session:  Not on file  . Stress: Not on file  Relationships  . Social connections:    Talks on phone: Not on file    Gets together: Not on file    Attends religious service: Not on file    Active member of club or organization: Not on file    Attends meetings of clubs or organizations: Not on file    Relationship status: Not on file  . Intimate partner violence:    Fear of current or ex partner: Not on file    Emotionally abused: Not on file    Physically abused: Not on file    Forced sexual activity: Not on file  Other Topics Concern  . 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 Arena's family history includes Breast cancer in her cousin, paternal aunt, and paternal  grandmother; Fainting in her maternal grandmother; Heart attack in her maternal grandmother, mother, and sister; Heart disease in her mother; Seizures in her maternal aunt; Sudden death in her maternal grandmother, mother, and sister.      Objective:    Vitals:   08/03/18 0837  BP: 90/60  Pulse: 100    Physical Exam; well-developed African-American female in no acute distress, holding her abdomen, blood pressure 90/60 pulse 100 height 5 foot 10, weight 153, BMI of 22 and HEENT nontraumatic normocephalic EOMI PERRLA sclera anicteric, mucosa moist, Cardiovascular; regular rate and rhythm with S1-S2 no murmur rub or gallop, Pulmonary; clear bilaterally, Abdomen; soft, somewhat distended in the upper abdomen with tympany bowel sounds are present, tender in the upper abdomen no guarding or rebound no palpable mass or hepatosplenomegaly Rectal ;exam not done, Extremities; no clubbing cyanosis or edema skin warm dry, Neuro psych; alert and oriented grossly nonfocal mood and affect appropriate       Assessment & Plan:   #23 33 year old African-American female with chronic GERD currently poorly controlled on Zantac twice daily.  She specifically desires not to take omeprazole is willing to try Nexium. #2 ongoing upper abdominal discomfort bloating and distention-etiology is not clear.  This may be functional dyspepsia, will rule out gastroparesis, rule out gallbladder disease rule out gastropathy or peptic ulcer disease #3 chronic constipation improved with Linzess #4 history of eating disorder #5.  History of prior narcotic abuse #6.  Migraine headaches #7.  Bipolar disorder, somatization disorder, personality disorder #8 POTS  syndrome  Plan; continue Linzess 145 mcg daily Exline start Nexium 40 mg p.o. twice daily Antireflux regimen Have asked her to follow a gastroparesis step to to step 3 diet with small frequent feedings Plain abdominal films today rule out any obstructive  component Schedule for upper abdominal ultrasound If  above studies are unremarkable, will proceed to gastric emptying scan and EGD with Dr. Henrene Pastor.  Amy S Esterwood PA-C 08/03/2018   Cc: [name], MD

## 2018-08-03 NOTE — Patient Instructions (Signed)
Your provider has requested that you go to the basement level for lab work before leaving today. Press "B" on the elevator. The lab is located at the first door on the left as you exit the elevator. Take Nexium 40 mg, take 1 capsul twice daily. Prescription sent to your pharmacy. We have given you samples of Linzess 145 mcg for constipation. If this helps, call us and we will send a prescription. Call [phone] and choose option 2, ask for Breasia Karges.   Eat small frequent meals.  Gastroparesis diet handout has been provided.   You have been scheduled for an abdominal ultrasound at [facility] (1st floor of hospital) on Tuesday 08-04-2018 at 8:00 am. Please arrive 15 minutes prior to your appointment for registration. Make certain not to have anything to eat or drink 6 hours prior to your appointment. Should you need to reschedule your appointment, please contact radiology at [phone]. This test typically takes about 30 minutes to perform.  Normal BMI (Body Mass Index- based on height and weight) is between 19 and 25. Your BMI today is Body mass index is 22 kg/m. Marland Kitchen Please consider follow up  regarding your BMI with your Primary Care Provider.

## 2018-08-04 ENCOUNTER — Telehealth: Payer: Self-pay

## 2018-08-04 ENCOUNTER — Other Ambulatory Visit: Payer: Self-pay

## 2018-08-04 ENCOUNTER — Ambulatory Visit (HOSPITAL_COMMUNITY)
Admission: RE | Admit: 2018-08-04 | Discharge: 2018-08-04 | Disposition: A | Discharge status: Home / Self Care | Payer: Self-pay | Source: Ambulatory Visit | Attending: Physician Assistant | Admitting: Physician Assistant

## 2018-08-04 ENCOUNTER — Encounter: Payer: Self-pay | Admitting: Neurology

## 2018-08-04 ENCOUNTER — Ambulatory Visit (INDEPENDENT_AMBULATORY_CARE_PROVIDER_SITE_OTHER): Payer: Self-pay | Admitting: Neurology

## 2018-08-04 VITALS — BP 114/62 | HR 93 | Ht 70.0 in | Wt 153.0 lb

## 2018-08-04 DIAGNOSIS — R55 Syncope and collapse: Secondary | ICD-10-CM

## 2018-08-04 DIAGNOSIS — K5900 Constipation, unspecified: Secondary | ICD-10-CM

## 2018-08-04 DIAGNOSIS — R1010 Upper abdominal pain, unspecified: Secondary | ICD-10-CM

## 2018-08-04 DIAGNOSIS — R140 Abdominal distension (gaseous): Secondary | ICD-10-CM

## 2018-08-04 DIAGNOSIS — R200 Anesthesia of skin: Secondary | ICD-10-CM

## 2018-08-04 DIAGNOSIS — K219 Gastro-esophageal reflux disease without esophagitis: Secondary | ICD-10-CM

## 2018-08-04 DIAGNOSIS — R202 Paresthesia of skin: Secondary | ICD-10-CM

## 2018-08-04 NOTE — Telephone Encounter (Signed)
-----   Message from Alfredia Ferguson, PA-C sent at 08/03/2018  1:16 PM EDT ----- Please let pt know her labs were all normal except she is mildly anemic- please ask her about menses - heavy? , how many days?Marland KitchenMarland Kitchen Also would like her to come back in the next week for Iron studies  - thanks
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
## 2018-08-04 NOTE — Progress Notes (Signed)
assessment and plans reviewed

## 2018-08-04 NOTE — Telephone Encounter (Signed)
Spoke with the patient. She states she does have long and heavy menses. She is a patient at the [facility]. States she has not seen her doctor (Dr. Roselie Awkward) in a long time. States it is difficult to get an appointment.  Takes prenatal vitamins.
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
## 2018-08-05 ENCOUNTER — Encounter: Payer: Self-pay | Admitting: Physical Medicine & Rehabilitation

## 2018-08-05 ENCOUNTER — Ambulatory Visit (HOSPITAL_BASED_OUTPATIENT_CLINIC_OR_DEPARTMENT_OTHER): Payer: No Typology Code available for payment source | Admitting: Physical Medicine & Rehabilitation

## 2018-08-05 ENCOUNTER — Encounter: Payer: Self-pay | Admitting: Psychology

## 2018-08-05 VITALS — BP 112/69 | HR 115 | Resp 14 | Ht 70.0 in | Wt 152.0 lb

## 2018-08-05 DIAGNOSIS — F4312 Post-traumatic stress disorder, chronic: Secondary | ICD-10-CM

## 2018-08-05 DIAGNOSIS — M797 Fibromyalgia: Secondary | ICD-10-CM
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
## 2018-08-05 NOTE — Patient Instructions (Signed)
Milnacipran tablets What is this medicine? MILNACIPRAN (mil NA si pran) is used to treat the pain caused by fibromyalgia. This medicine may be used for other purposes; ask your health care provider or pharmacist if you have questions. COMMON BRAND NAME(S): Savella What should I tell my health care provider before I take this medicine? They need to know if you have any of these conditions: -bleeding disorders -glaucoma -heart disease -high blood pressure -if you often drink alcohol -irregular heart beat -kidney disease -liver disease -mania or bipolar disorder -prostate disease -seizures -suicidal thoughts, plans, or attempt; a previous suicide attempt by you or a family member -taken an MAOIs like Carbex, Eldepryl, Marplan, Nardil, and Parnate within 14 days -an unusual or allergic reaction to milnacipran, levomilnacipran, other medicines, foods, or preservatives -pregnant or trying to get pregnant -breast-feeding How should I use this medicine? Take this medicine by mouth with a glass of water. You can take it with or without food. If it upsets your stomach, take it with food. Follow the directions on the prescription label. Do not take your medicine more often than directed. Do not stop taking this medicine suddenly except upon the advice of your doctor. Stopping this medicine too quickly may cause serious side effects or your condition may worsen. A special MedGuide will be given to you by the pharmacist with each prescription and refill. Be sure to read this information carefully each time. Talk to your pediatrician regarding the use of this medicine in children. Special care may be needed. Overdosage: If you think you have taken too much of this medicine contact a poison control center or emergency room at once. NOTE: This medicine is only for you. Do not share this medicine with others. What if I miss a dose? If you miss a dose, skip that dose and take the next dose at your  regular time. Do not take double or extra doses. What may interact with this medicine? Do not take this medicine with any of the following medications: -desvenlafaxine -duloxetine -levomilnacipran -linezolid -MAOIs like Carbex, Eldepryl, Marplan, Nardil, and Parnate -methylene blue (injected into a vein) -venlafaxine This medicine may also interact with the following medications: -amphetamines -aspirin and aspirin-like medicines -clonidine -digoxin -epinephrine -furazolidone -lithium -medicines for blood pressure -medicines for migraine headache like almotriptan, eletriptan, frovatriptan, naratriptan, rizatriptan, sumatriptan, zolmitriptan -medicines that treat or prevent blood clots like warfarin, enoxaparin, and dalteparin -NSAIDS, medicines for pain and inflammation, like ibuprofen or naproxen -other medicines for depression, anxiety, or psychotic disturbances -procarbazine -rasagiline -St. John's wort, Hypericum perforatum -tramadol -tryptophan This list may not describe all possible interactions. Give your health care provider a list of all the medicines, herbs, non-prescription drugs, or dietary supplements you use. Also tell them if you smoke, drink alcohol, or use illegal drugs. Some items may interact with your medicine. What should I watch for while using this medicine? Tell your doctor if your symptoms do not get better or if they get worse. Visit your doctor or health care professional for regular checks on your progress. Patients and their families should watch out for new or worsening thoughts of suicide or depression. Also watch out for sudden changes in feelings such as feeling anxious, agitated, panicky, irritable, hostile, aggressive, impulsive, severely restless, overly excited and hyperactive, or not being able to sleep. If this happens, especially at the beginning of treatment or after a change in dose, call your health care professional. This medicine can cause  an increase in blood pressure. Check with  your doctor for instructions on monitoring your blood pressure while taking this medicine. You may get drowsy or dizzy. Do not drive, use machinery, or do anything that needs mental alertness until you know how this medicine affects you. Do not stand or sit up quickly, especially if you are an older patient. This reduces the risk of dizzy or fainting spells. Alcohol may interfere with the effect of this medicine. Avoid alcoholic drinks. Your mouth may get dry. Chewing sugarless gum, sucking hard candy and drinking plenty of water will help. Contact your doctor if the problem does not go away or is severe. What side effects may I notice from receiving this medicine? Side effects that you should report to your doctor or health care professional as soon as possible: -allergic reactions like skin rash, itching or hives, swelling of the face, lips, or tongue -anxious -breathing problems -confusion -changes in vision -chest pain -confusion -elevated mood, decreased need for sleep, racing thoughts, impulsive behavior -eye pain -fast, irregular heartbeat -feeling faint or lightheaded, falls -feeling agitated, angry, or irritable -hallucination, loss of contact with reality -high blood pressure -loss of balance or coordination -palpitations -redness, blistering, peeling or loosening of the skin, including inside the mouth -restlessness, pacing, inability to keep still -seizures -stiff muscles -suicidal thoughts or other mood changes -trouble passing urine or change in the amount of urine -trouble sleeping -unusual bleeding or bruising -unusually weak or tired -vomiting -yellowing of the eyes or skin Side effects that usually do not require medical attention (report to your doctor or health care professional if they continue or are bothersome): -change in sex drive or performance -change in appetite or weight -constipation -dizziness -dry  mouth -headache -increased sweating -nausea -tired This list may not describe all possible side effects. Call your doctor for medical advice about side effects. You may report side effects to FDA at 1-800-FDA-1088. Where should I keep my medicine? Keep out of the reach of children. Store at room temperature between 15 and 30 degrees C (59 and 86 degrees F). Throw away any unused medicine after the expiration date. NOTE: This sheet is a summary. It may not cover all possible information. If you have questions about this medicine, talk to your doctor, pharmacist, or health care provider.  2018 Elsevier/Gold Standard (2016-04-10 18:27:46)

## 2018-08-05 NOTE — Telephone Encounter (Signed)
Ok,noted

## 2018-08-09 ENCOUNTER — Encounter: Payer: PRIVATE HEALTH INSURANCE | Admitting: Physical Therapy

## 2018-08-12 ENCOUNTER — Encounter: Payer: No Typology Code available for payment source | Admitting: Psychology

## 2018-08-18 ENCOUNTER — Ambulatory Visit
Payer: No Typology Code available for payment source | Attending: Physical Medicine & Rehabilitation | Admitting: Physical Therapy

## 2018-08-18 ENCOUNTER — Encounter: Payer: Self-pay | Admitting: Physical Therapy

## 2018-08-18 DIAGNOSIS — M545 Low back pain: Secondary | ICD-10-CM | POA: Insufficient documentation

## 2018-08-18 DIAGNOSIS — M542 Cervicalgia: Secondary | ICD-10-CM | POA: Insufficient documentation

## 2018-08-18 DIAGNOSIS — R252 Cramp and spasm: Secondary | ICD-10-CM | POA: Insufficient documentation

## 2018-08-18 DIAGNOSIS — G8929 Other chronic pain: Secondary | ICD-10-CM | POA: Insufficient documentation

## 2018-08-18 NOTE — Therapy (Signed)
[place], [place], [address] Phone: [phone]   Fax:  [phone]  Physical Therapy Treatment  Patient Details  Name: [name] MRN: [id number] Date of Birth: [birth year] Referring Provider: Alysia Penna   Encounter Date: 08/18/2018  PT End of Session - 08/18/18 1007    Visit Number  10    PT Start Time  0930    PT Stop Time  1020    PT Time Calculation (min)  50 min    Activity Tolerance  Patient limited by pain    Behavior During Therapy  Anxious       Past Medical History:  Diagnosis Date  . Allergic rhinitis 01/24/2015  . Arrhythmia Dx 2015  . Back pain   . Breast mass   . Common migraine 04/15/2017  . Depression Dx 2000  . Fibromyalgia   . GERD (gastroesophageal reflux disease) 03/13/2015  . Headache    migraines  . Heart murmur Dx 2015  . 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 Hypertension Dx 8101[address]  . Lumbar radicular pain 11/22/2014  . Lumbar radiculopathy   . Pain disorder 08/29/2017  . Panic attack Dx 2006  . Positive depression screening 08/29/2017  . POTS (postural orthostatic tachycardia syndrome) 03/24/2014  . PTSD (post-traumatic stress disorder) Dx 2013  . Seizures ([place]) 2001  . Vitamin D deficiency 01/24/2015    Past Surgical History:  Procedure Laterality Date  . BREAST BIOPSY Left   . ESOPHAGOGASTRODUODENOSCOPY N/A 08/14/2015   Procedure: ESOPHAGOGASTRODUODENOSCOPY (EGD);  Surgeon: Carol Ada, MD;  Location: [facility];  Service: Endoscopy;  Laterality: N/A;    There were no vitals filed for this visit.  Subjective Assessment - 08/18/18 0933    Subjective  Pt reports neck stiffness and pain down her spine in the left side    Currently in Pain?  Yes    Pain Score  7     Pain Location  Neck    Pain Orientation  Left    Pain Type  Chronic pain                       [facility] Adult PT Treatment/Exercise - 08/18/18 0001       Exercises   Exercises  Neck;Lumbar      Neck Exercises: Standing   Other Standing Exercises  Shoulder Ext yellow tband      Neck Exercises: Seated   Other Seated Exercise  Tband rows yellow 2x10       Lumbar Exercises: Aerobic   UBE (Upper Arm Bike)  L1 x3 min each way      Modalities   Modalities  Electrical Stimulation;Moist Heat      Moist Heat Therapy   Number Minutes Moist Heat  15 Minutes    Moist Heat Location  Cervical      Electrical Stimulation   Electrical Stimulation Location  thoracic and lumbar    Electrical Stimulation Action  IFC    Electrical Stimulation Parameters  supine    Electrical Stimulation Goals  Pain      Manual Therapy   Manual therapy comments  Attempted some gentel ROM of RLE but causes pain                PT Short Term Goals - 06/17/18 1145      PT SHORT TERM GOAL #1   Baseline  able to and doing all exercises  but depending on the day she has increased pain as result     Status  Achieved        PT Long Term Goals - 08/18/18 0939      PT LONG TERM GOAL #4   Title  Patient to report improved function overall by 50% or more.    Status  On-going            Plan - 08/18/18 1011    Clinical Impression Statement  Pt ambulate din clinic reporting increase neck pain on her R side that went down into her butt bone on R side. Pt had to started to twist body on UBE warm up. She did the seated rows next with less movement on R side compared to R. Ask pt to perform standing shoulder extensions. After a few reps in pt started elevating RLE reporting increase R hip pain. Pt stated that her hip hurt to the point to were she could not tolerate weight.  Pt was able to ambulate to treatment room. Attempted some gentile ROM of R hip but all motions caused pain.    Rehab Potential  Fair    PT Frequency  3x / week    PT Duration  8 weeks    PT Treatment/Interventions  ADLs/Self Care Home Management;Cryotherapy;Moist  Heat;Ultrasound;Neuromuscular re-education;Therapeutic exercise;Patient/family education;Manual techniques;Taping;Dry needling;Scar mobilization;Passive range of motion    PT Next Visit Plan  posture, cervical retraction and ROM (passive and active); AAROM for shoulders; lumbar ROM/pelvic tilts; progress to strengtheing as tolerated. Try OP decompression exercises. Estim/heat for pain. Breathing drills for diaphragm.        Patient will benefit from skilled therapeutic intervention in order to improve the following deficits and impairments:  Decreased range of motion, Pain, Decreased activity tolerance, Decreased strength  Visit Diagnosis: Chronic bilateral low back pain, with sciatica presence unspecified  Cervicalgia  Cramp and spasm     Problem List Patient Active Problem List   Diagnosis Date Noted  . DUB (dysfunctional uterine bleeding) 06/17/2018  . Uterine fibroid 01/18/2018  . Cervical radiculopathy 01/01/2018  . Pain disorder 08/29/2017  . Positive depression screening 08/29/2017  . Neck pain without injury 08/24/2017  . Chronic fatigue 05/15/2017  . Common migraine 04/15/2017  . Axillary mass, right 04/09/2017  . History of bulimia nervosa 03/13/2015  . GERD (gastroesophageal reflux disease) 03/13/2015  . SOB (shortness of breath) 03/13/2015  . H/O vitamin D deficiency 03/13/2015  . Health care maintenance 01/25/2015  . Chronic tension type headache 01/25/2015  . Muscle pain, fibromyalgia 01/24/2015  . Allergic rhinitis 01/24/2015  . Lumbar radicular pain 11/22/2014  . Thoracic neuralgia 11/22/2014  . Back pain   . Hemoglobin AC 07/03/2014  . POTS (postural orthostatic tachycardia syndrome) 03/24/2014  . Abdominal pain 06/06/2012  . Chronic pain 06/06/2012  . Depression 06/06/2012  . HEMOPTYSIS UNSPECIFIED 12/14/2009  . BREAST TENDERNESS 11/08/2009  . NIPPLE DISCHARGE 11/08/2009  . MENORRHAGIA 11/08/2009  . DISORDER, BIPOLAR NOS 08/06/2007  . PERSONALITY  DISORDER 08/06/2007  . MARIJUANA ABUSE 08/06/2007  . NARCOTIC ABUSE 08/06/2007  . SEIZURE DISORDER 08/06/2007  . SOMATIZATION DISORDER 12/27/2005    Scot Jun, PTA 08/18/2018, 10:21 AM  [place], [place], [address] Phone: [phone]   Fax:  [phone]  Name: ZELA SOBIESKI MRN: [id number] Date of Birth: 08/07/1986

## 2018-08-20 ENCOUNTER — Ambulatory Visit: Payer: No Typology Code available for payment source | Admitting: Physical Therapy

## 2018-08-24 ENCOUNTER — Encounter
Payer: No Typology Code available for payment source | Attending: Physical Medicine & Rehabilitation | Admitting: Psychology

## 2018-08-24 DIAGNOSIS — Z803 Family history of malignant neoplasm of breast: Secondary | ICD-10-CM | POA: Insufficient documentation

## 2018-08-24 DIAGNOSIS — F4312 Post-traumatic stress disorder, chronic: Secondary | ICD-10-CM

## 2018-08-24 DIAGNOSIS — K219 Gastro-esophageal reflux disease without esophagitis: Secondary | ICD-10-CM | POA: Insufficient documentation

## 2018-08-24 DIAGNOSIS — M545 Low back pain: Secondary | ICD-10-CM | POA: Insufficient documentation

## 2018-08-24 DIAGNOSIS — Z87891 Personal history of nicotine dependence: Secondary | ICD-10-CM | POA: Insufficient documentation

## 2018-08-24 DIAGNOSIS — F329 Major depressive disorder, single episode, unspecified: Secondary | ICD-10-CM | POA: Insufficient documentation

## 2018-08-24 DIAGNOSIS — Z8249 Family history of ischemic heart disease and other diseases of the circulatory system: Secondary | ICD-10-CM | POA: Insufficient documentation

## 2018-08-24 DIAGNOSIS — F4310 Post-traumatic stress disorder, unspecified: Secondary | ICD-10-CM | POA: Insufficient documentation

## 2018-08-24 DIAGNOSIS — M797 Fibromyalgia: Secondary | ICD-10-CM | POA: Insufficient documentation

## 2018-08-24 DIAGNOSIS — I10 Essential (primary) hypertension: Secondary | ICD-10-CM | POA: Insufficient documentation

## 2018-08-24 DIAGNOSIS — Z820 Family history of epilepsy and other diseases of the nervous system: Secondary | ICD-10-CM | POA: Insufficient documentation

## 2018-08-24 DIAGNOSIS — F331 Major depressive disorder, recurrent, moderate: Secondary | ICD-10-CM

## 2018-08-24 DIAGNOSIS — G8929 Other chronic pain: Secondary | ICD-10-CM | POA: Insufficient documentation

## 2018-08-24 DIAGNOSIS — Z9889 Other specified postprocedural states: Secondary | ICD-10-CM | POA: Insufficient documentation

## 2018-08-24 DIAGNOSIS — R569 Unspecified convulsions: Secondary | ICD-10-CM

## 2018-08-30 ENCOUNTER — Ambulatory Visit: Payer: Self-pay | Attending: Family Medicine | Admitting: Family Medicine

## 2018-08-30 ENCOUNTER — Telehealth: Payer: Self-pay | Admitting: Physician Assistant

## 2018-08-30 ENCOUNTER — Ambulatory Visit (HOSPITAL_BASED_OUTPATIENT_CLINIC_OR_DEPARTMENT_OTHER): Payer: Self-pay | Admitting: Licensed Clinical Social Worker

## 2018-08-30 ENCOUNTER — Encounter: Payer: Self-pay | Admitting: Family Medicine

## 2018-08-30 ENCOUNTER — Other Ambulatory Visit: Payer: Self-pay

## 2018-08-30 VITALS — BP 159/63 | HR 103 | Temp 98.7°F | Ht 70.0 in | Wt 154.2 lb

## 2018-08-30 DIAGNOSIS — K219 Gastro-esophageal reflux disease without esophagitis: Secondary | ICD-10-CM | POA: Insufficient documentation

## 2018-08-30 DIAGNOSIS — R1084 Generalized abdominal pain: Secondary | ICD-10-CM | POA: Insufficient documentation

## 2018-08-30 DIAGNOSIS — E559 Vitamin D deficiency, unspecified: Secondary | ICD-10-CM | POA: Insufficient documentation

## 2018-08-30 DIAGNOSIS — M5412 Radiculopathy, cervical region: Secondary | ICD-10-CM

## 2018-08-30 DIAGNOSIS — Z79899 Other long term (current) drug therapy: Secondary | ICD-10-CM | POA: Insufficient documentation

## 2018-08-30 DIAGNOSIS — Z8639 Personal history of other endocrine, nutritional and metabolic disease: Secondary | ICD-10-CM

## 2018-08-30 DIAGNOSIS — F331 Major depressive disorder, recurrent, moderate: Secondary | ICD-10-CM

## 2018-08-30 DIAGNOSIS — I10 Essential (primary) hypertension: Secondary | ICD-10-CM | POA: Insufficient documentation

## 2018-08-30 DIAGNOSIS — G8929 Other chronic pain: Secondary | ICD-10-CM | POA: Insufficient documentation

## 2018-08-30 DIAGNOSIS — M50122 Cervical disc disorder at C5-C6 level with radiculopathy: Secondary | ICD-10-CM | POA: Insufficient documentation

## 2018-08-30 DIAGNOSIS — R1010 Upper abdominal pain, unspecified: Secondary | ICD-10-CM

## 2018-08-30 DIAGNOSIS — F4310 Post-traumatic stress disorder, unspecified: Secondary | ICD-10-CM | POA: Insufficient documentation

## 2018-08-30 DIAGNOSIS — M797 Fibromyalgia: Secondary | ICD-10-CM | POA: Insufficient documentation

## 2018-08-30 MED ORDER — VITAMIN D (ERGOCALCIFEROL) 1.25 MG (50000 UNIT) PO CAPS: 50000.0000 [IU] | ORAL_CAPSULE | ORAL | 0 refills | Status: DC

## 2018-08-30 NOTE — Telephone Encounter (Signed)
° °  Patient called today to make appt with Dr Caryl Comes. Patient states she is upset because she is still having symptoms and was not offered an appointment during her last telephone call.

## 2018-08-30 NOTE — Progress Notes (Signed)
Subjective:  Patient ID: [name], female    DOB: 05-Feb-1986  Age: 32 y.o. MRN: [id number]  CC: back pain   HPI JASENIA WEILBACHER is a 32 year old right-handed female with a history of hypertension, POTS, PTSD, anxiety and depression, chronic arthralgias here for a follow-up visit. She complains of "pain everywhere "including pain in her neck, lower back and sometimes is unable to move her right arm she says at other times she is unable to walk because she cannot lift her feet.  Her right hand sometimes goes numb. Recently seen by Dr. Cyril Mourning is a physical medicine and rehab and informs me she chose to no longer follow-up with him as "he was doing nothing for her".  She was seen by a neuropsychologist in his office and as per notes she was diagnosed with PTSD.  She discontinued the Zoloft which he was prescribing for her.  She has a therapist at [facility] and her physicians have been ineffective. Physical therapy sessions beneficial at the time but cause her to be worse about 4 days after she is in chronic pain.  She has tried acupuncture, massage and other modalities of treatment in the past also none revealed. She informs me her imaging here at [facility] have been unremarkable however imaging from [facility] revealed abnormal findings. I have reviewed her records from care everywhere and lumbar spine x-ray from 12/2016 revealed degenerative disc disease of her lumbar spine but lumbar spine MRI from 11/2017 was unremarkable at [facility] MRI C-spine from [facility] revealed C5-C6 bulging disc in 11/2017 similar to that from [facility] on 08/2017. She wonders if she can see a rheumatologist but I have reviewed her chart and her autoimmune labs have been negative.  She complains of intermittent abdominal bloating with reduced appetite at other times she forgets to eat, alternating diarrhea and constipation.  States she was told she has IBS but then thinks this is more than IBS. She goes on to ask me  if she can have a PET scan as she is concerned she could have a malignancy.  Review of her recent abdominal ultrasound revealed no abnormal finding, CT abdomen and pelvis from 01/2018 revealed no acute abdominal pelvic abnormality and she has an upcoming appointment for gastric emptying study. She is currently being seen by GI but is unsatisfied with her care and she refused to take medications for her abdominal symptoms.  Pap smear from 03/2018 was negative for malignancy and she has been seen by GYN for pelvic symptoms. Review of her chart indicates she has undergone several imaging including abdominal x-rays, ultrasounds, CT abdomen and pelvis, pelvic ultrasounds breast imaging, MRI of the brain, MRI of the cervical, thoracic, lumbar spine and the last 2 years. She breaks down crying as she states all the doctors tell her "it is in her head" but then she has met with other women some of whom were diagnosed with fibromyalgia and have now died because it was an underlying undiagnosed condition.  She would like to work but has been unable to.  She is unhappy because she thought she had postural orthostatic tachycardia syndrome which her cardiologist told her she no longer has and so she discontinued Metoprolol because she "does not know what he is treating her for". She has no family support as her parents and grandparents are deceased and she was raised with her nephews; she has no friends.  Past Medical History:  Diagnosis Date  . Allergic rhinitis 01/24/2015  . Arrhythmia  Dx 2015  . Back pain   . Breast mass   . Common migraine 04/15/2017  . Depression Dx 2000  . Fibromyalgia   . GERD (gastroesophageal reflux disease) 03/13/2015  . Headache    migraines  . Heart murmur Dx 2015  . 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 Hypert[place] Dx [address]  . Lumbar radicular pain 11/22/2014  . Lumbar radiculopathy   . Pain disorder 08/29/2017  . Panic attack Dx 2006  . Positive depression  screening 08/29/2017  . POTS (postural orthostatic tachycardia syndrome) 03/24/2014  . PTSD (post-traumatic stress disorder) Dx 2013  . Seizures ([place]) 2001  . Vitamin D deficiency 01/24/2015     Past Surgical History:  Procedure Laterality Date  . BREAST BIOPSY Left   . ESOPHAGOGASTRODUODENOSCOPY N/A 08/14/2015   Procedure: ESOPHAGOGASTRODUODENOSCOPY (EGD);  Surgeon: Carol Ada, MD;  Location: [facility];  Service: Endoscopy;  Laterality: N/A;    Allergies  Allergen Reactions  . Aspirin Hives     Outpatient Medications Prior to Visit  Medication Sig Dispense Refill  . diclofenac sodium (VOLTAREN) 1 % GEL Apply 2 g topically 4 (four) times daily. 100 g 1  . gabapentin (NEURONTIN) 300 MG capsule Take 1 cap in AM, 2 caps in PM 90 capsule 6  . metoprolol succinate (TOPROL-XL) 100 MG 24 hr tablet Take 1 tablet (100 mg total) by mouth daily. 30 tablet 3  . Prenatal Vit-Fe Fumarate-FA (PRENATAL VITAMIN PO) Take 1 tablet by mouth daily.    . sertraline (ZOLOFT) 50 MG tablet Take 1 tablet (50 mg total) by mouth daily. 30 tablet 1  . meloxicam (MOBIC) 7.5 MG tablet Take 1 tablet (7.5 mg total) by mouth daily. (Patient not taking: Reported on 08/30/2018) 30 tablet 0  . methocarbamol (ROBAXIN) 500 MG tablet Take 1 tablet (500 mg total) by mouth every 8 (eight) hours as needed. (Patient not taking: Reported on 08/30/2018) 60 tablet 2  . norgestimate-ethinyl estradiol (ORTHO-CYCLEN,SPRINTEC,PREVIFEM) 0.25-35 MG-MCG tablet Take 1 tablet by mouth daily. 2 pills a day until no bleeding then 1 daily. (Patient not taking: Reported on 08/30/2018) 2 Package 5  . ondansetron (ZOFRAN) 4 MG tablet Take 1 tablet (4 mg total) by mouth every 6 (six) hours. (Patient not taking: Reported on 08/30/2018) 12 tablet 0  . ranitidine (ZANTAC) 75 MG tablet Take 75 mg by mouth as needed for heartburn.    . Vitamin D, Ergocalciferol, (DRISDOL) 50000 units CAPS capsule Take 1 capsule (50,000 Units total) by mouth every 7  (seven) days. (Patient not taking: Reported on 08/30/2018) 16 capsule 0   No facility-administered medications prior to visit.     ROS Review of Systems  Constitutional: Positive for appetite change. Negative for activity change and fatigue.  HENT: Negative for congestion, sinus pressure and sore throat.   Eyes: Negative for visual disturbance.  Respiratory: Negative for cough, chest tightness, shortness of breath and wheezing.   Cardiovascular: Negative for chest pain and palpitations.  Gastrointestinal: Positive for abdominal distention, abdominal pain, constipation and diarrhea.  Endocrine: Negative for polydipsia.  Genitourinary: Negative for dysuria and frequency.  Musculoskeletal: Positive for arthralgias, back pain and neck pain.  Skin: Negative for rash.  Neurological: Positive for numbness. Negative for tremors and light-headedness.  Hematological: Does not bruise/bleed easily.  Psychiatric/Behavioral: Positive for dysphoric mood. Negative for agitation and behavioral problems.    Objective:  BP (!) 159/63   Pulse (!) 103   Temp 98.7 F (37.1 C) (Oral)  Ht 5' 10"  (1.778 m)   Wt 154 lb 3.2 oz (69.9 kg)   SpO2 100%   BMI 22.13 kg/m   BP/Weight 08/30/2018 08/05/2018 0/05/6225  Systolic BP 333 545 625  Diastolic BP 63 69 62  Wt. (Lbs) 154.2 152 153  BMI 22.13 21.81 21.95      Physical Exam  Constitutional: She is oriented to person, place, and time. She appears well-developed and well-nourished.  Cardiovascular: Normal heart sounds and intact distal pulses. Tachycardia present.  No murmur heard. Pulmonary/Chest: Effort normal and breath sounds normal. She has no wheezes. She has no rales. She exhibits no tenderness.  Abdominal: Soft. Bowel sounds are normal. She exhibits no distension and no mass. There is no tenderness.  Musculoskeletal: Normal range of motion.  Tenderness on palpation of trigger points and all joints Abduction restricted in bilateral lower  extremity; left limited to 90 degrees and right limited to 70 degrees  Neurological: She is alert and oriented to person, place, and time.  Skin: Skin is warm and dry.  Psychiatric:  Dysphoric mood     Assessment & Plan:   1. H/O vitamin D deficiency We will repeat a course of vitamin D replacement - Vitamin D, Ergocalciferol, (DRISDOL) 50000 units CAPS capsule; Take 1 capsule (50,000 Units total) by mouth every 7 (seven) days.  Dispense: 16 capsule; Refill: 0  2. Muscle pain, fibromyalgia Currently not on any medications Cannot exclude inadequate effort during physical exam especially with exam findings of restricted range of motion in bilateral upper extremities out of proportion to symptoms Yoga, aquatic therapy, massage discussed as physical therapy have been ineffective To be seen by rheumatology but labs to exclude connective tissue disorders have been negative and I have informed that there is no indication at this time.  3. Moderate episode of recurrent major depressive disorder ([place]) Currently does not take Zoloft Psychotherapy with [facility] has been effective LCSW called in to see patient  4. Cervical radiculopathy She does have C5-C6 disc herniation Refuses to take medications  5. Essential hypertension Elevated due to not taking her antihypertensive I have strongly encouraged her that with her without a diagnosis of pots, I will be beneficial to take her metoprolol for blood pressure control and tachycardia Counseled on blood pressure goal of less than 130/80, low-sodium, DASH diet, medication compliance, 150 minutes of moderate intensity exercise per week. Discussed medication compliance, adverse effects.  6. Generalized abdominal pain Currently being seen by GI and has upcoming appointment for gastric emptying study however she would like a second GI referral I have explained this point there is no indication for PET scan which she is requesting - Ambulatory  referral to Gastroenterology   Meds ordered this encounter  Medications  . Vitamin D, Ergocalciferol, (DRISDOL) 50000 units CAPS capsule    Sig: Take 1 capsule (50,000 Units total) by mouth every 7 (seven) days.    Dispense:  16 capsule    Refill:  0    Follow-up: Return in about 3 months (around 11/30/2018) for Follow-up of chronic medical conditions.   [name] MD

## 2018-08-30 NOTE — Telephone Encounter (Signed)
She is reviewing her results from Feb 2018 when she has a renal stone study imaging. She was not clear on what a compressed gall bladder indicated. She is willing to go forward with the gastric emptying study. Scheduled for 09/03/18 arrive to [facility] at 7:15 am. NPO after midnight. No stomach medications for 8 hours prior.

## 2018-08-31 ENCOUNTER — Encounter: Payer: Self-pay | Admitting: Internal Medicine

## 2018-08-31 ENCOUNTER — Ambulatory Visit (INDEPENDENT_AMBULATORY_CARE_PROVIDER_SITE_OTHER): Payer: No Typology Code available for payment source | Admitting: Internal Medicine

## 2018-08-31 NOTE — BH Specialist Note (Signed)
Integrated Behavioral Health Initial Visit  MRN: [id number] Name: [name]  Number of [place] Clinician visits:: 1/6 Session Start time: 11:50 AM  Session End time: 12:25 PM Total time: 35 minutes  Type of Service: [place] Interpretor:No. Interpretor Name and Language: N/A   SUBJECTIVE: [name] is a 32 y.o. female accompanied by self Patient was referred by Dr. Margarita Rana for anxiety and depression. Patient reports the following symptoms/concerns: Pt experiencing chronic pain. She has underwent various testing; however, reports inconsistent findings from [facility] and [employer]. Pt states that she has an increase in distrust with Providers and is concerned that her health is not being taken seriously Duration of problem: Ongoing; Severity of problem: severe  OBJECTIVE: Mood: Anxious, Dysphoric and Irritable and Affect: Depressed and Tearful Risk of harm to self or others: No plan to harm self or others  LIFE CONTEXT: Family and Social: Pt shared immediate family members are all deceased. She resides in her car and sometimes with a friend School/Work: Pt has no income. She was denied SSI and Medicaid in the past.  Self-Care: Pt receives behavioral health services in the form of psychiatry Life Changes: Pt has continued concerns for medical health and has difficulty managing chronic pain  GOALS ADDRESSED: Patient will: 1. Reduce symptoms of: agitation, anxiety and depression 2. Increase knowledge and/or ability of: self-management skills  3. Demonstrate ability to: Increase adequate support systems for patient/family  INTERVENTIONS: Interventions utilized: Solution-Focused Strategies, Supportive Counseling, Psychoeducation and/or Health Education and Link to [employer]  Standardized Assessments completed: GAD-7 and PHQ 2&9  ASSESSMENT: Patient currently experiencing difficulty managing her  chronic pain. She has underwent various testing; however, reports inconsistent findings from [facility] and [employer]. Pt states that she has an increase in distrust with Providers and is concerned that her health is not being taken seriously. Pt denies SI/HI/AVH.   Patient participates in medication management. LCSWA educated pt on the correlation between one's physical and mental health. Pt has good insight on how her chronic pain negatively impacts her mental health, in addition, to being homeless. Therapeutic strategies were discussed to assist pt in advocating for her health needs and communicating effectively with providers. A legal aid referral was submitted on 09/01/18 to further assist pt with information on patient rights.   PLAN: 1. Follow up with behavioral health clinician on : Pt was encouraged to contact LCSWA if symptoms worsen or fail to improve to schedule behavioral appointments at [facility]. 2. Behavioral recommendations: LCSWA recommends that pt apply healthy coping skills discussed, continue to comply with medication management, and follow up with Legal Aid. Pt is encouraged to schedule follow up appointment with LCSWA 3. Referral(s): [place] (In Clinic) and Legal Aid 4. "From scale of 1-10, how likely are you to follow plan?":   Rebekah Chesterfield, LCSW 09/02/18 8:08 AM

## 2018-09-01 ENCOUNTER — Encounter: Payer: Self-pay | Admitting: Psychology

## 2018-09-01 NOTE — Progress Notes (Signed)
Today was our last visit according to the patient.  While we worked on coping issues and helping her understand the medical issues the patient is trying to cope with and her desire that she could be "cured" and returned to baseline.  The patient was very upset with the course of her care across the board and did not want to be followed by any [facility].  She had been to [facility] recently and they (according to patient) told her about things that are wrong with her and she feels that they will be able to help her more.  Patient did not want to schedule any more visits with me or our office.  I let her know that if in future she wanted to continue with me that I would be more than willing to see her again if she wished.

## 2018-09-03 ENCOUNTER — Ambulatory Visit (HOSPITAL_COMMUNITY)
Admission: RE | Admit: 2018-09-03 | Discharge: 2018-09-03 | Disposition: A | Discharge status: Home / Self Care | Payer: No Typology Code available for payment source | Source: Ambulatory Visit | Attending: Physician Assistant | Admitting: Physician Assistant

## 2018-09-03 DIAGNOSIS — R1010 Upper abdominal pain, unspecified: Secondary | ICD-10-CM

## 2018-09-03 MED ORDER — TECHNETIUM TC 99M SULFUR COLLOID
1.8200 | Freq: Once | INTRAVENOUS | Status: AC | PRN
  Administered 2018-09-03: 1.82 via INTRAVENOUS

## 2018-09-06 ENCOUNTER — Encounter: Payer: Self-pay | Admitting: Family Medicine

## 2018-09-07 NOTE — Telephone Encounter (Signed)
Patient mychart concern.

## 2018-09-08 ENCOUNTER — Emergency Department (HOSPITAL_COMMUNITY): Payer: Self-pay

## 2018-09-08 ENCOUNTER — Emergency Department (HOSPITAL_COMMUNITY)
Admission: EM | Admit: 2018-09-08 | Discharge: 2018-09-08 | Disposition: A | Discharge status: Home / Self Care | Payer: Self-pay | Attending: Emergency Medicine | Admitting: Emergency Medicine

## 2018-09-08 ENCOUNTER — Other Ambulatory Visit: Payer: Self-pay

## 2018-09-08 ENCOUNTER — Encounter (HOSPITAL_COMMUNITY): Payer: Self-pay | Admitting: Emergency Medicine

## 2018-09-08 DIAGNOSIS — Z87891 Personal history of nicotine dependence: Secondary | ICD-10-CM | POA: Insufficient documentation

## 2018-09-08 DIAGNOSIS — R112 Nausea with vomiting, unspecified: Secondary | ICD-10-CM | POA: Insufficient documentation

## 2018-09-08 DIAGNOSIS — R072 Precordial pain: Secondary | ICD-10-CM | POA: Insufficient documentation

## 2018-09-08 DIAGNOSIS — I10 Essential (primary) hypertension: Secondary | ICD-10-CM | POA: Insufficient documentation

## 2018-09-08 LAB — HEPATIC FUNCTION PANEL
ALT: 14 U/L (ref 0–44)
AST: 22 U/L (ref 15–41)
Albumin: 4.5 g/dL (ref 3.5–5.0)
Alkaline Phosphatase: 70 U/L (ref 38–126)
Bilirubin, Direct: <0.1 mg/dL (ref 0.0–0.2)
Total Bilirubin: 0.7 mg/dL (ref 0.3–1.2)
Total Protein: 8 g/dL (ref 6.5–8.1)

## 2018-09-08 LAB — I-STAT TROPONIN, ED: Troponin i, poc: 0 ng/mL (ref 0.00–0.08)

## 2018-09-08 LAB — BASIC METABOLIC PANEL
Anion gap: 9 (ref 5–15)
BUN: 10 mg/dL (ref 6–20)
CO2: 28 mmol/L (ref 22–32)
Calcium: 9.4 mg/dL (ref 8.9–10.3)
Chloride: 105 mmol/L (ref 98–111)
Creatinine, Ser: 0.66 mg/dL (ref 0.44–1.00)
GFR calc Af Amer: >60 mL/min (ref >60)
GFR calc non Af Amer: >60 mL/min (ref >60)
Glucose, Bld: 97 mg/dL (ref 70–99)
Potassium: 3 mmol/L — ABNORMAL LOW (ref 3.5–5.1)
Sodium: 142 mmol/L (ref 135–145)

## 2018-09-08 LAB — CBC
HCT: 29.5 % — ABNORMAL LOW (ref 36.0–46.0)
Hemoglobin: 9.6 g/dL — ABNORMAL LOW (ref 12.0–15.0)
MCH: 26.5 pg (ref 26.0–34.0)
MCHC: 32.5 g/dL (ref 30.0–36.0)
MCV: 81.5 fL (ref 80.0–100.0)
Platelets: 378 10*3/uL (ref 150–400)
RBC: 3.62 MIL/uL — ABNORMAL LOW (ref 3.87–5.11)
RDW: 16 % — ABNORMAL HIGH (ref 11.5–15.5)
WBC: 5.4 10*3/uL (ref 4.0–10.5)
nRBC: 0 % (ref 0.0–0.2)

## 2018-09-08 LAB — I-STAT BETA HCG BLOOD, ED (MC, WL, AP ONLY): I-stat hCG, quantitative: <5 m[IU]/mL (ref <5)

## 2018-09-08 LAB — D-DIMER, QUANTITATIVE: D-Dimer, Quant: 0.97 ug/mL-FEU — ABNORMAL HIGH (ref 0.00–0.50)

## 2018-09-08 LAB — LIPASE, BLOOD: Lipase: 30 U/L (ref 11–51)

## 2018-09-08 MED ORDER — SODIUM CHLORIDE 0.9 % IV BOLUS
1000.0000 mL | Freq: Once | INTRAVENOUS | Status: AC
  Administered 2018-09-08: 1000 mL via INTRAVENOUS

## 2018-09-08 MED ORDER — PANTOPRAZOLE SODIUM 20 MG PO TBEC: 20.0000 mg | DELAYED_RELEASE_TABLET | Freq: Every day | ORAL | 0 refills | Status: DC

## 2018-09-08 MED ORDER — IOPAMIDOL (ISOVUE-370) INJECTION 76%
INTRAVENOUS | Status: AC
  Filled 2018-09-08: qty 100

## 2018-09-08 MED ORDER — METOCLOPRAMIDE HCL 10 MG PO TABS: 10.0000 mg | ORAL_TABLET | Freq: Four times a day (QID) | ORAL | 0 refills | Status: DC | PRN

## 2018-09-08 MED ORDER — HALOPERIDOL LACTATE 5 MG/ML IJ SOLN
5.0000 mg | Freq: Once | INTRAMUSCULAR | Status: AC
  Administered 2018-09-08: 5 mg via INTRAVENOUS
  Filled 2018-09-08: qty 1

## 2018-09-08 MED ORDER — IOPAMIDOL (ISOVUE-370) INJECTION 76%
100.0000 mL | Freq: Once | INTRAVENOUS | Status: AC | PRN
  Administered 2018-09-08: 100 mL via INTRAVENOUS

## 2018-09-08 MED ORDER — SODIUM CHLORIDE 0.9 % IJ SOLN
INTRAMUSCULAR | Status: AC
  Filled 2018-09-08: qty 50

## 2018-09-08 MED ORDER — ONDANSETRON HCL 4 MG/2ML IJ SOLN
4.0000 mg | Freq: Once | INTRAMUSCULAR | Status: AC
  Administered 2018-09-08: 4 mg via INTRAVENOUS
  Filled 2018-09-08: qty 2

## 2018-09-08 MED ORDER — MORPHINE SULFATE (PF) 4 MG/ML IV SOLN
4.0000 mg | Freq: Once | INTRAVENOUS | Status: AC
  Administered 2018-09-08: 4 mg via INTRAVENOUS
  Filled 2018-09-08: qty 1

## 2018-09-08 NOTE — Discharge Instructions (Signed)
You have been seen in the Emergency Department (ED) for abdominal pain and chest pain.  Your evaluation did not identify a clear cause of your symptoms but was generally reassuring.  Please follow up as instructed above regarding todays emergent visit and the symptoms that are bothering you.  Return to the ED if your abdominal pain worsens or fails to improve, you develop bloody vomiting, bloody diarrhea, you are unable to tolerate fluids due to vomiting, fever greater than 101, or other symptoms that concern you.

## 2018-09-08 NOTE — ED Triage Notes (Signed)
Patient c/o intermittent central chest pressure since waking up this morning. Endorses SOB and N/V.

## 2018-09-08 NOTE — ED Provider Notes (Signed)
Emergency Department Provider Note   I have reviewed the triage vital signs and the nursing notes.   HISTORY  Chief Complaint Chest Pain   HPI [name] is a 32 y.o. female with PMH of fibromyalgia, panic disorder, GERD, and report of POTS resents to the emergency department with intermittent central chest pressure since waking this morning.  She has some associated shortness of breath with nausea and vomiting.  She describes an intense pain in the left chest radiating to the back.  Pain is waxing and waning.  No associated abdominal discomfort.  No diarrhea.  No fevers or chills.  No productive cough.  Patient states this feels unlike her abdominal pain episodes. No modifying factors.   Past Medical History:  Diagnosis Date  . Allergic rhinitis 01/24/2015  . Arrhythmia Dx 2015  . Back pain   . Breast mass   . Common migraine 04/15/2017  . Depression Dx 2000  . Fibromyalgia   . GERD (gastroesophageal reflux disease) 03/13/2015  . Headache    migraines  . Heart murmur Dx 2015  . 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 Hypertension Dx [address]  . Lumbar radicular pain 11/22/2014  . Lumbar radiculopathy   . Pain disorder 08/29/2017  . Panic attack Dx 2006  . Positive depression screening 08/29/2017  . POTS (postural orthostatic tachycardia syndrome) 03/24/2014  . PTSD (post-traumatic stress disorder) Dx 2013  . Seizures ([place]) 2001  . Vitamin D deficiency 01/24/2015    Patient Active Problem List   Diagnosis Date Noted  . Hypertension 08/30/2018  . DUB (dysfunctional uterine bleeding) 06/17/2018  . Uterine fibroid 01/18/2018  . Cervical radiculopathy 01/01/2018  . Pain disorder 08/29/2017  . Positive depression screening 08/29/2017  . Neck pain without injury 08/24/2017  . Chronic fatigue 05/15/2017  . Common migraine 04/15/2017  . Axillary mass, right 04/09/2017  . History of bulimia nervosa 03/13/2015  . GERD (gastroesophageal reflux disease) 03/13/2015  .  SOB (shortness of breath) 03/13/2015  . H/O vitamin D deficiency 03/13/2015  . Health care maintenance 01/25/2015  . Chronic tension type headache 01/25/2015  . Muscle pain, fibromyalgia 01/24/2015  . Allergic rhinitis 01/24/2015  . Lumbar radicular pain 11/22/2014  . Thoracic neuralgia 11/22/2014  . Back pain   . Hemoglobin AC 07/03/2014  . POTS (postural orthostatic tachycardia syndrome) 03/24/2014  . Abdominal pain 06/06/2012  . Chronic pain 06/06/2012  . Depression 06/06/2012  . HEMOPTYSIS UNSPECIFIED 12/14/2009  . BREAST TENDERNESS 11/08/2009  . NIPPLE DISCHARGE 11/08/2009  . MENORRHAGIA 11/08/2009  . DISORDER, BIPOLAR NOS 08/06/2007  . PERSONALITY DISORDER 08/06/2007  . MARIJUANA ABUSE 08/06/2007  . NARCOTIC ABUSE 08/06/2007  . SEIZURE DISORDER 08/06/2007  . 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 BREAST BIOPSY Left   . ESOPHAGOGASTRODUODENOSCOPY N/A 08/14/2015   Procedure: ESOPHAGOGASTRODUODENOSCOPY (EGD);  Surgeon: Carol Ada, MD;  Location: [facility];  Service: Endoscopy;  Laterality: N/A;    Allergies Aspirin  Family History  Problem Relation Age of Onset  . Heart attack Mother   . Heart disease Mother   . Sudden death Mother   . Sudden death Sister   . Heart attack Sister   . Sudden death Maternal Grandmother   . Fainting Maternal Grandmother   . Heart attack Maternal Grandmother   . Breast cancer Paternal Grandmother   . Breast cancer Cousin   . Breast cancer Paternal Aunt   . Seizures Maternal Aunt  Social History Social History   Tobacco Use  . Smoking status: Former Smoker    Last attempt to quit: 06/23/2012    Years since quitting: 6.2  . Smokeless tobacco: Never Used  Substance Use Topics  . Alcohol use: Not Currently    Comment: rarely  . Drug use: No    Review of Systems  Constitutional: No fever/chills Eyes: No visual changes. ENT: No sore throat. Cardiovascular: Positive  chest pain. Respiratory: Positive shortness of breath. Gastrointestinal: No abdominal pain. Positive nausea and vomiting.  No diarrhea.  No constipation. Genitourinary: Negative for dysuria. Musculoskeletal: Negative for back pain. Skin: Negative for rash. Neurological: Negative for headaches, focal weakness or numbness.  10-point ROS otherwise negative.  ____________________________________________   PHYSICAL EXAM:  VITAL SIGNS: ED Triage Vitals  Enc Vitals Group     BP 09/08/18 1644 (!) 153/80     Pulse Rate 09/08/18 1644 (!) 117     Resp 09/08/18 1644 (!) 22     Temp 09/08/18 1644 98.2 F (36.8 C)     Temp Source 09/08/18 1644 Oral     SpO2 09/08/18 1644 100 %     Pain Score 09/08/18 1642 10   Constitutional: Alert and oriented. Patient is sitting up in bed with occasional dry heaves.  Eyes: Conjunctivae are normal.  Head: Atraumatic. Nose: No congestion/rhinnorhea. Mouth/Throat: Mucous membranes are moist.  Neck: No stridor.  Cardiovascular: Sinus tachycardia. Good peripheral circulation. Grossly normal heart sounds.   Respiratory: Normal respiratory effort.  No retractions. Lungs CTAB. Gastrointestinal: Soft and nontender. No distention.  Musculoskeletal: No lower extremity tenderness nor edema. No gross deformities of extremities. Neurologic:  Normal speech and language. No gross focal neurologic deficits are appreciated.  Skin:  Skin is warm, dry and intact. No rash noted.  ____________________________________________   LABS (all labs ordered are listed, but only abnormal results are displayed)  Labs Reviewed  BASIC METABOLIC PANEL - Abnormal; Notable for the following components:      Result Value   Potassium 3.0 (*)    All other components within normal limits  CBC - Abnormal; Notable for the following components:   RBC 3.62 (*)    Hemoglobin 9.6 (*)    HCT 29.5 (*)    RDW 16.0 (*)    All other components within normal limits  D-DIMER, QUANTITATIVE  (NOT AT [facility]) - Abnormal; Notable for the following components:   D-Dimer, Quant 0.97 (*)    All other components within normal limits  HEPATIC FUNCTION PANEL  LIPASE, BLOOD  I-STAT TROPONIN, ED  I-STAT BETA HCG BLOOD, ED ([facility], [facility], [facility] ONLY)   ____________________________________________  EKG   EKG Interpretation  Date/Time:  Wednesday September 08 2018 16:45:31 EDT Ventricular Rate:  110 PR Interval:    QRS Duration: 71 QT Interval:  335 QTC Calculation: 454 R Axis:   80 Text Interpretation:  Sinus tachycardia LAE, consider biatrial enlargement RSR' in V1 or V2, probably normal variant No STEMI.  Confirmed by Nanda Quinton [phone]) on 09/08/2018 4:53:48 PM       ____________________________________________  RADIOLOGY  Ct Angio Chest Pe W And/or Wo Contrast  Result Date: 09/08/2018 CLINICAL DATA:  Dyspnea and chest pain x1 day EXAM: CT ANGIOGRAPHY CHEST WITH CONTRAST TECHNIQUE: Multidetector CT imaging of the chest was performed using the standard protocol during bolus administration of intravenous contrast. Multiplanar CT image reconstructions and MIPs were obtained to evaluate the vascular anatomy. CONTRAST:  170mL ISOVUE-370 IOPAMIDOL (ISOVUE-370) INJECTION 76% COMPARISON:  None.  FINDINGS: Cardiovascular: The study is of quality for the evaluation of pulmonary embolism. There are no filling defects in the central, lobar, segmental or subsegmental pulmonary artery branches to suggest acute pulmonary embolism. Great vessels are normal in course and caliber. Normal heart size. No significant pericardial fluid/thickening. Minimal atherosclerosis of the nonaneurysmal thoracic aorta. No aortic dissection allowing for cardiac motion artifacts. Mediastinum/Nodes: No discrete thyroid nodules. Unremarkable esophagus. No pathologically enlarged axillary, mediastinal or hilar lymph nodes. Lungs/Pleura: No pneumothorax. No pleural effusion. No pulmonary consolidation or dominant mass. Minimal  paraseptal emphysematous changes at the lung apices. Upper abdomen: Unremarkable. Musculoskeletal:  No aggressive appearing focal osseous lesions. Review of the MIP images confirms the above findings. IMPRESSION: No acute pulmonary embolus, aortic aneurysm or dissection. No active pulmonary disease. Emphysema (ICD10-J43.9). Electronically Signed   By: Ashley Royalty M.D.   On: 09/08/2018 20:38   Dg Abdomen Acute W/chest  Result Date: 09/08/2018 CLINICAL DATA:  Intermittent central chest pain since this morning. Dyspnea. EXAM: DG ABDOMEN ACUTE W/ 1V CHEST COMPARISON:  11/13/2017 FINDINGS: There is no evidence of dilated bowel loops or free intraperitoneal air. Moderate stool retention within the colon. No radiopaque calculi. Lower lumbar facet arthropathy bilaterally at L5-S1. Heart size and mediastinal contours are within normal limits. Both lungs are clear. IMPRESSION: Moderate stool retention within the colon. No bowel obstruction. No free air. No acute cardiopulmonary disease. Electronically Signed   By: Ashley Royalty M.D.   On: 09/08/2018 18:58    ____________________________________________   PROCEDURES  Procedure(s) performed:   Procedures  None  ____________________________________________   INITIAL IMPRESSION / ASSESSMENT AND PLAN / ED COURSE  Pertinent labs & imaging results that were available during my care of the patient were reviewed by me and considered in my medical decision making (see chart for details).  Presents to the emergency department with intermittent left chest pressure with shortness of breath, nausea, vomiting.  Patient appears very uncomfortable when I walk in the room with significant tachycardia.  This improves with talking to her about her symptoms and evaluating her she does remain tachycardic to the 110s as opposed to the 150s when I initially walked in the room.  I reviewed her most recent clinic note which outlines a Vicy Medico history of pain episodes and chronic  pain management.  Most of this seems to be related to the abdomen and pelvis. Plan for screening labs, IV fluids, pain/nausea medication.  Labs reviewed. Patient with elevated D-dimer. Continues to have pain. CTA obtained with no acute findings. Patient improved after Haldol. Suspect GI etiology clinically, possibly gastroparesis. With symptoms resolved will discharged home with plan for GI follow up.   At this time, I do not feel there is any life-threatening condition present. I have reviewed and discussed all results (EKG, imaging, lab, urine as appropriate), exam findings with patient. I have reviewed nursing notes and appropriate previous records.  I feel the patient is safe to be discharged home without further emergent workup. Discussed usual and customary return precautions. Patient and family (if present) verbalize understanding and are comfortable with this plan.  Patient will follow-up with their primary care provider. If they do not have a primary care provider, information for follow-up has been provided to them. All questions have been answered.  ____________________________________________  FINAL CLINICAL IMPRESSION(S) / ED DIAGNOSES  Final diagnoses:  Precordial chest pain  Non-intractable vomiting with nausea, unspecified vomiting type     MEDICATIONS GIVEN DURING THIS VISIT:  Medications  sodium chloride 0.9 %  injection (has no administration in time range)  iopamidol (ISOVUE-370) 76 % injection (has no administration in time range)  sodium chloride 0.9 % bolus 1,000 mL ( Intravenous Stopped 09/08/18 1848)  morphine 4 MG/ML injection 4 mg (4 mg Intravenous Given 09/08/18 1732)  ondansetron (ZOFRAN) injection 4 mg (4 mg Intravenous Given 09/08/18 1732)  haloperidol lactate (HALDOL) injection 5 mg (5 mg Intravenous Given 09/08/18 1944)  iopamidol (ISOVUE-370) 76 % injection 100 mL (100 mLs Intravenous Contrast Given 09/08/18 1959)     NEW OUTPATIENT MEDICATIONS STARTED  DURING THIS VISIT:  New Prescriptions   METOCLOPRAMIDE (REGLAN) 10 MG TABLET    Take 1 tablet (10 mg total) by mouth every 6 (six) hours as needed for nausea.   PANTOPRAZOLE (PROTONIX) 20 MG TABLET    Take 1 tablet (20 mg total) by mouth daily.    Note:  This document was prepared using Dragon voice recognition software and may include unintentional dictation errors.  Nanda Quinton, MD Emergency Medicine    Adama Ivins, [facility], MD 09/08/18 [phone]

## 2018-09-13 ENCOUNTER — Other Ambulatory Visit: Payer: Self-pay | Admitting: Family Medicine

## 2018-09-13 MED ORDER — MELOXICAM 7.5 MG PO TABS: 7.5000 mg | ORAL_TABLET | Freq: Every day | ORAL | 0 refills | Status: DC

## 2018-09-13 MED ORDER — LIDOCAINE 5 % EX PTCH: 1.0000 | MEDICATED_PATCH | CUTANEOUS | 0 refills | Status: DC

## 2018-09-14 ENCOUNTER — Ambulatory Visit: Payer: No Typology Code available for payment source | Admitting: Physical Therapy

## 2018-09-14 MED FILL — ?LIDOCAINE 5% PATCH: 5 | 30 days supply | Qty: 30 | Fill #0

## 2018-09-14 MED FILL — MELOXICAM 7.5 MG TABLET: 7.5 | 30 days supply | Qty: 30 | Fill #0

## 2018-09-14 NOTE — Progress Notes (Signed)
Pt left without being seen as I was late

## 2018-09-20 ENCOUNTER — Ambulatory Visit (INDEPENDENT_AMBULATORY_CARE_PROVIDER_SITE_OTHER): Payer: No Typology Code available for payment source | Admitting: Internal Medicine

## 2018-09-20 ENCOUNTER — Encounter: Payer: Self-pay | Admitting: Internal Medicine

## 2018-09-20 VITALS — BP 110/64 | HR 94 | Ht 70.0 in | Wt 158.0 lb

## 2018-09-20 DIAGNOSIS — R000 Tachycardia, unspecified: Secondary | ICD-10-CM

## 2018-09-20 DIAGNOSIS — R079 Chest pain, unspecified: Secondary | ICD-10-CM

## 2018-09-20 DIAGNOSIS — G909 Disorder of the autonomic nervous system, unspecified: Secondary | ICD-10-CM

## 2018-09-20 DIAGNOSIS — I498 Other specified cardiac arrhythmias: Secondary | ICD-10-CM

## 2018-09-20 DIAGNOSIS — R002 Palpitations: Secondary | ICD-10-CM

## 2018-09-20 DIAGNOSIS — G90A Postural orthostatic tachycardia syndrome (POTS): Secondary | ICD-10-CM

## 2018-09-20 DIAGNOSIS — I951 Orthostatic hypotension: Secondary | ICD-10-CM

## 2018-09-20 NOTE — Progress Notes (Signed)
Patient Care Team: [name], MD as PCP - General (Family Medicine)   HPI  [name] is a 32 y.o. female Seen in followup for palpitations that I thought was possibly dysautonomic;  the event recorder supported diagnosis  Showing recurrent sinus tachycardia. She also been noted to have right bundle branch block/RAD ventricular ectopy.  She has been treated with beta-blockers.     There was a family of "cardiac arrest",     2015 cardiac MRI was normal 2015 signal average ECG was normal 2015 echocardiogram was normal  She struggles with PTSD and anxiety. 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 She is raising her 2 sons of her older sister.     She she reports an episode of a heart rate of over 250 associated with syncope and that she was taken by EMS who picked her up from work and took to [employer] and she says she was not seen >> careeverywhere identficied no records there  Records from [place] showed normal  VS.  We  collected and the information from [facility]. Vital signs on arrival demonstrated a blood pressure 150/96 with a pulse of 80.   In addition the EMS narrative does  not describe attendance for syncope but rather for chest pain.  In the past  she expressed passive suicidality.    In the interim she has been seen by GI for nausea vomiting.  She has been diagnosed with IBS.  She has been seen frequently by GI and the emergency room for vomiting, chest pain, headaches.  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  She continues to have periodic syncope 2-3 times a week.  These episodes occur without warning and from any position.  We again reviewed the story of her sister who presented in 2010 with chest pain.  She was discharged with a diagnosis of costochondritis.  Outpatient echo demonstrated an ejection fraction of 35%.  She died a couple of weeks later.  This is created a great deal of  anxiety and distrust of a healthcare providers       Past Medical History:  Diagnosis Date  . Allergic rhinitis 01/24/2015  . Arrhythmia Dx 2015  . Back pain   . Breast mass   . Common migraine 04/15/2017  . Depression Dx 2000  . Fibromyalgia   . GERD (gastroesophageal reflux disease) 03/13/2015  . Headache    migraines  . Heart murmur Dx 2015  . 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 Hypertension Dx 54982  . Lumbar radicular pain 11/22/2014  . Lumbar radiculopathy   . Pain disorder 08/29/2017  . Panic attack Dx 2006  . Positive depression screening 08/29/2017  . POTS (postural orthostatic tachycardia syndrome) 03/24/2014  . PTSD (post-traumatic stress disorder) Dx 2013  . Seizures ([place]) 2001  . Vitamin D deficiency 01/24/2015    Past Surgical History:  Procedure Laterality Date  . BREAST BIOPSY Left   . ESOPHAGOGASTRODUODENOSCOPY N/A 08/14/2015   Procedure: ESOPHAGOGASTRODUODENOSCOPY (EGD);  Surgeon: Carol Ada, MD;  Location: [facility];  Service: Endoscopy;  Laterality: N/A;    Current Outpatient Medications  Medication Sig Dispense Refill  . diclofenac sodium (VOLTAREN) 1 % GEL Apply 2 g topically 4 (four) times daily. 100 g 1  . gabapentin (NEURONTIN) 300 MG capsule Take 1 cap in AM, 2 caps in PM 90 capsule 6  . lidocaine (LIDODERM) 5 %  Place 1 patch onto the skin daily. Remove & Discard patch within 12 hours or as directed by MD 30 patch 0  . meloxicam (MOBIC) 7.5 MG tablet Take 1 tablet (7.5 mg total) by mouth daily. 30 tablet 0  . metoCLOPramide (REGLAN) 10 MG tablet Take 1 tablet (10 mg total) by mouth every 6 (six) hours as needed for nausea. 30 tablet 0  . metoprolol succinate (TOPROL-XL) 100 MG 24 hr tablet Take 1 tablet (100 mg total) by mouth daily. 30 tablet 3  . norgestimate-ethinyl estradiol (ORTHO-CYCLEN,SPRINTEC,PREVIFEM) 0.25-35 MG-MCG tablet Take 1 tablet by mouth daily. 2 pills a day until no bleeding then 1 daily. 2 Package 5  . ondansetron  (ZOFRAN) 4 MG tablet Take 1 tablet (4 mg total) by mouth every 6 (six) hours. 12 tablet 0  . pantoprazole (PROTONIX) 20 MG tablet Take 1 tablet (20 mg total) by mouth daily. 30 tablet 0  . Prenatal Vit-Fe Fumarate-FA (PRENATAL VITAMIN PO) Take 1 tablet by mouth daily.    . sertraline (ZOLOFT) 50 MG tablet Take 1 tablet (50 mg total) by mouth daily. 30 tablet 1   No current facility-administered medications for this visit.     Allergies  Allergen Reactions  . Aspirin Hives    Review of Systems negative except from HPI and PMH  Physical Exam BP 110/64   Pulse 94   Ht _0  (1.778 m)   Wt 158 lb (71.7 kg)   LMP 09/08/2018   SpO2 98%   BMI 22.67 kg/m  Well developed and nourished in no acute distress HENT normal Neck supple with JVP-flat Clear Regular rate and rhythm, no murmurs or gallops Abd-soft with active BS No Clubbing cyanosis edema Skin-warm and dry A & Oriented  Grossly normal sensory and motor function     ECG sinus @ 110 14/07/34  Assessment and  Plan  Ventricular ectopy-right bundle branch/RAD  Dysautonomia/orthostatic intolerance   Chest pain-atypical   Anxiety/PTSD  SYncope  Elevated Blood pressure  Biatrial enlargement   She has never met the criteria for POTS.  Reviewing her sister's history from 2010 there was unexplained cardiomyopathy death.  Notes from before suggested that this had occurred in the context of cancer or dialysis; it seems not to be the case.  Her evaluation was quite extensive then; ECG shows biatrial enlargement.  We will repeat the echocardiogram  Given her chest pains, we will undertake CTA  Her syncope episodes are frequent.  I have no idea whether they are arrhythmic or not; it has been our impression that they are not.  We will use a ZIO Patch  More than 50% of 40 min was spent in counseling related to the above

## 2018-09-20 NOTE — Patient Instructions (Signed)
Medication Instructions:  Your physician recommends that you continue on your current medications as directed. Please refer to the Current Medication list given to you today.  Labwork: None ordered.   Testing/Procedures: Your physician has recommended that you wear a holter monitor. Holter monitors are medical devices that record the heart's electrical activity. Doctors most often use these monitors to diagnose arrhythmias. Arrhythmias are problems with the speed or rhythm of the heartbeat. The monitor is a small, portable device. You can wear one while you do your normal daily activities. This is usually used to diagnose what is causing palpitations/syncope (passing out).  Your physician has requested that you have cardiac CT. Cardiac computed tomography (CT) is a painless test that uses an x-ray machine to take clear, detailed pictures of your heart. For further information please visit [employer]. Please follow instruction sheet as given.   Follow-Up:   You will follow up with Dr Caryl Comes based off your monitor and CT results.   Any Other Special Instructions Will Be Listed Below (If Applicable).     If you need a refill on your cardiac medications before your next appointment, please call your pharmacy.

## 2018-09-22 ENCOUNTER — Other Ambulatory Visit: Payer: Self-pay

## 2018-09-22 ENCOUNTER — Ambulatory Visit (HOSPITAL_COMMUNITY): Payer: No Typology Code available for payment source | Attending: Cardiovascular Disease

## 2018-09-22 ENCOUNTER — Ambulatory Visit (INDEPENDENT_AMBULATORY_CARE_PROVIDER_SITE_OTHER): Payer: No Typology Code available for payment source

## 2018-09-22 DIAGNOSIS — R002 Palpitations: Secondary | ICD-10-CM | POA: Insufficient documentation

## 2018-09-22 DIAGNOSIS — R079 Chest pain, unspecified: Secondary | ICD-10-CM | POA: Insufficient documentation

## 2018-09-23 ENCOUNTER — Telehealth: Payer: Self-pay

## 2018-09-23 NOTE — Telephone Encounter (Signed)
Notes recorded by Frederik Schmidt, RN on 09/23/2018 at 5:15 PM EDT The patient has been notified of the result and verbalized understanding. All questions (if any) were answered. Frederik Schmidt, RN 09/23/2018 5:15 PM

## 2018-09-23 NOTE — Telephone Encounter (Signed)
-----   Message from Maria Meredith Leeds, MD sent at 09/23/2018  4:22 PM EDT ----- TTE without major abnormality. Plan per Caryl Comes upon his return. No changes

## 2018-09-24 ENCOUNTER — Ambulatory Visit
Payer: No Typology Code available for payment source | Attending: Physical Medicine & Rehabilitation | Admitting: Physical Therapy

## 2018-09-24 DIAGNOSIS — M542 Cervicalgia: Secondary | ICD-10-CM | POA: Insufficient documentation

## 2018-09-24 DIAGNOSIS — R252 Cramp and spasm: Secondary | ICD-10-CM | POA: Insufficient documentation

## 2018-09-24 NOTE — Therapy (Addendum)
[place], [place], [address] Phone: [phone]   Fax:  [phone]  Physical Therapy Treatment  Patient Details  Name: [name] MRN: [id number] Date of Birth: 1986/04/29 Referring Provider (PT): Alysia Penna   Encounter Date: 09/24/2018  PT End of Session - 09/24/18 1001    PT Start Time  0930    PT Stop Time  1010    PT Time Calculation (min)  40 min       Past Medical History:  Diagnosis Date  . Allergic rhinitis 01/24/2015  . Arrhythmia Dx 2015  . Back pain   . Breast mass   . Common migraine 04/15/2017  . Depression Dx 2000  . Fibromyalgia   . GERD (gastroesophageal reflux disease) 03/13/2015  . Headache    migraines  . Heart murmur Dx 2015  . 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x [address]  . Lumbar radicular pain 11/22/2014  . Lumbar radiculopathy   . Pain disorder 08/29/2017  . Panic attack Dx 2006  . Positive depression screening 08/29/2017  . POTS (postural orthostatic tachycardia syndrome) 03/24/2014  . PTSD (post-traumatic stress disorder) Dx 2013  . Seizures ([place]) 2001  . Vitamin D deficiency 01/24/2015    Past Surgical History:  Procedure Laterality Date  . BREAST BIOPSY Left   . ESOPHAGOGASTRODUODENOSCOPY N/A 08/14/2015   Procedure: ESOPHAGOGASTRODUODENOSCOPY (EGD);  Surgeon: Carol Ada, MD;  Location: [facility];  Service: Endoscopy;  Laterality: N/A;    There were no vitals filed for this visit.  Subjective Assessment - 09/24/18 0934    Subjective  more testing since last session - DDD and arthritis in lumbar and buldging in cerv ( TENS unit). Pt reports doing alot at home and doing water exercise. yoga and meditation.    Currently in Pain?  Yes    Pain Score  8     Pain Location  Back         [facility] PT Assessment - 09/24/18 0001      AROM   Overall AROM Comments  cerv ROM decreased 75% and lumb ROM decreased 100% with  multi compenstions                   [facility] Adult PT Treatment/Exercise - 09/24/18 0001      Exercises   Exercises  Neck;Lumbar      Neck Exercises: Machines for Strengthening   UBE (Upper Arm Bike)  L 2 2 fwd/ 2back      Lumbar Exercises: Aerobic   Nustep  L 2 5 min      Lumbar Exercises: Seated   Other Seated Lumbar Exercises  isometric ball ex 15 times hold 3 sec   blue tband trunk ext 10 times   Other Seated Lumbar Exercises  dyna disc pelvic ROM and LE ex 10 reps   pt has at home so can add these to HEP            PT Education - 09/24/18 0941    Education Details  educ on need for ex and mvmt with DDD and fibro diagnosis    Person(s) Educated  Patient    Methods  Explanation    Comprehension  Verbalized understanding       PT Short Term Goals - 09/24/18 0949      PT SHORT TERM GOAL #1   Title  I with initial HEP    Status  Achieved      PT SHORT TERM GOAL #2   Title  Patient to report pain decrease by 25% overall    Status  On-going      PT SHORT TERM GOAL #3   Title  Patient to demo improved postural alignement (normal head position, no left lumbar SB, no post pelvic tilt).    Status  On-going        PT Long Term Goals - 09/24/18 0950      PT LONG TERM GOAL #1   Title  Patient to report decreased pain overall by 60% or more with ADLS.    Status  On-going      PT LONG TERM GOAL #2   Title  Patient to demo improved cervical rotation to [facility] to normalize ADLs.    Status  On-going      PT LONG TERM GOAL #3   Status  Achieved      PT LONG TERM GOAL #4   Title  Patient to report improved function overall by 50% or more.    Status  On-going            Plan - 09/24/18 0950    Clinical Impression Statement  discussed with pt at length her poor attendance at PT. 5 weeks since last appt and 4 weeks prior to that appt. pt verb multi appt conflicts and no ride at time last minute. pt verb new diagnosis of DDD but explained to her that  does not change tx plan of ex and getting pt moving which is what she does not tolerate , has 75% to 100% cerv and lumb limitations and multi compensations. pt verb doing alot at home . educ pt that we will recert  for 1 x week provided she gets add'l insurance coverage and as per our policy if she no shows she will be D/C next appt. Also educ that we will do ther ex ONLY, no modalities as she has TENS at home.Pt VU all all discussed    PT Treatment/Interventions  ADLs/Self Care Home Management;Cryotherapy;Moist Heat;Ultrasound;Neuromuscular re-education;Therapeutic exercise;Patient/family education;Manual techniques;Taping;Dry needling;Scar mobilization;Passive range of motion    PT Home Exercise Plan  STRENGTH AND FUNC ONLY       Patient will benefit from skilled therapeutic intervention in order to improve the following deficits and impairments:  Decreased range of motion, Pain, Decreased activity tolerance, Decreased strength  Visit Diagnosis: Cervicalgia  Cramp and spasm     Problem List Patient Active Problem List   Diagnosis Date Noted  . Hypertension 08/30/2018  . DUB (dysfunctional uterine bleeding) 06/17/2018  . Uterine fibroid 01/18/2018  . Cervical radiculopathy 01/01/2018  . Pain disorder 08/29/2017  . Positive depression screening 08/29/2017  . Neck pain without injury 08/24/2017  . Chronic fatigue 05/15/2017  . Common migraine 04/15/2017  . Axillary mass, right 04/09/2017  . History of bulimia nervosa 03/13/2015  . GERD (gastroesophageal reflux disease) 03/13/2015  . SOB (shortness of breath) 03/13/2015  . H/O vitamin D deficiency 03/13/2015  . Health care maintenance 01/25/2015  . Chronic tension type headache 01/25/2015  . Muscle pain, fibromyalgia 01/24/2015  . Allergic rhinitis 01/24/2015  . Lumbar radicular pain 11/22/2014  . Thoracic neuralgia 11/22/2014  . Back pain   . Hemoglobin AC 07/03/2014  . POTS (postural orthostatic tachycardia syndrome)  03/24/2014  . Abdominal pain 06/06/2012  . Chronic pain 06/06/2012  . Depression 06/06/2012  . HEMOPTYSIS UNSPECIFIED 12/14/2009  . BREAST TENDERNESS 11/08/2009  . NIPPLE DISCHARGE 11/08/2009  .  MENORRHAGIA 11/08/2009  . DISORDER, BIPOLAR NOS 08/06/2007  . PERSONALITY DISORDER 08/06/2007  . MARIJUANA ABUSE 08/06/2007  . NARCOTIC ABUSE 08/06/2007  . SEIZURE DISORDER 08/06/2007  . SOMATIZATION DISORDER 12/27/2005   PHYSICAL THERAPY DISCHARGE SUMMARY   Plan: Patient agrees to discharge.  Patient goals were not met. Patient is being discharged due to not returning since the last visit.  ?????      PAYSEUR,ANGIE PTA 09/24/2018, 10:02 AM  [place], [place], [address] Phone: [phone]   Fax:  [phone]  Name: [name] MRN: [id number] Date of Birth: May 08, 1986

## 2018-09-29 MED FILL — GABAPENTIN 300 MG CAPSULE: 300 | 30 days supply | Qty: 90 | Fill #0

## 2018-09-29 MED FILL — VIT D2 1.25 MG (50,000 UNIT: 1.25 MG | 28 days supply | Qty: 4 | Fill #1

## 2018-09-29 MED FILL — SERTRALINE HCL 50 MG TABS: 50 | 30 days supply | Qty: 30 | Fill #1

## 2018-10-01 ENCOUNTER — Ambulatory Visit (INDEPENDENT_AMBULATORY_CARE_PROVIDER_SITE_OTHER): Payer: No Typology Code available for payment source | Admitting: Family Medicine

## 2018-10-01 ENCOUNTER — Encounter: Payer: Self-pay | Admitting: Family Medicine

## 2018-10-01 VITALS — BP 110/60 | HR 100 | Temp 98.3°F | Ht 70.0 in | Wt 156.1 lb

## 2018-10-01 DIAGNOSIS — R55 Syncope and collapse: Secondary | ICD-10-CM

## 2018-10-01 DIAGNOSIS — M549 Dorsalgia, unspecified: Secondary | ICD-10-CM

## 2018-10-01 DIAGNOSIS — G8929 Other chronic pain: Secondary | ICD-10-CM

## 2018-10-01 DIAGNOSIS — R079 Chest pain, unspecified: Secondary | ICD-10-CM

## 2018-10-01 DIAGNOSIS — D649 Anemia, unspecified: Secondary | ICD-10-CM

## 2018-10-01 DIAGNOSIS — G894 Chronic pain syndrome: Secondary | ICD-10-CM

## 2018-10-01 DIAGNOSIS — F331 Major depressive disorder, recurrent, moderate: Secondary | ICD-10-CM

## 2018-10-01 DIAGNOSIS — E876 Hypokalemia: Secondary | ICD-10-CM

## 2018-10-01 NOTE — Patient Instructions (Signed)
I will call you once I get a chance to review all of your records. Let me know if you have any concerns in the meanwhile.
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
## 2018-10-02 ENCOUNTER — Encounter: Payer: Self-pay | Admitting: Family Medicine

## 2018-10-04 ENCOUNTER — Telehealth: Payer: Self-pay

## 2018-10-04 ENCOUNTER — Telehealth: Payer: Self-pay | Admitting: Family Medicine

## 2018-10-04 ENCOUNTER — Ambulatory Visit: Payer: Self-pay | Attending: Family Medicine

## 2018-10-04 ENCOUNTER — Ambulatory Visit: Payer: No Typology Code available for payment source | Admitting: Physical Therapy

## 2018-10-04 NOTE — Telephone Encounter (Signed)
Patient came into the office and asked for a letter for food stamps.

## 2018-10-04 NOTE — Telephone Encounter (Signed)
Left message for patient to call back. CRM created 

## 2018-10-04 NOTE — Telephone Encounter (Signed)
Patient was called and informed that letter is ready for pick up. 

## 2018-10-04 NOTE — Telephone Encounter (Signed)
Letter is ready for pick-up.

## 2018-10-04 NOTE — Telephone Encounter (Signed)
Maria Macadam, MD  Rebecca Eaton, [name] did try to review previous records. She has quite a complex history and there have been so many opinions/work ups in past. I do think following with psychiatry is important to help her process everything she is going through (I saw in last psychiatry note that she was not planning to see them anymore so if she wants to see someone else we can refer). She also had some significant anemia that we need to follow up on. I don't see that this has been further evaluated recently so I have ordered bloodwork that I do not see she has had in past to look at iron levels and products of B12 to further explore deficiencies as this can affect everything. I would like her to complete bloodwork this week (doesn't have to be fasting). I will call her with results and otherwise I think we start with her seeing ortho and take it from there.

## 2018-10-05 ENCOUNTER — Encounter (HOSPITAL_COMMUNITY): Payer: Self-pay

## 2018-10-05 ENCOUNTER — Ambulatory Visit (HOSPITAL_COMMUNITY)
Admission: RE | Admit: 2018-10-05 | Discharge: 2018-10-05 | Disposition: A | Discharge status: Home / Self Care | Payer: No Typology Code available for payment source | Source: Ambulatory Visit | Attending: Internal Medicine | Admitting: Internal Medicine

## 2018-10-05 ENCOUNTER — Ambulatory Visit (HOSPITAL_COMMUNITY): Payer: No Typology Code available for payment source

## 2018-10-05 DIAGNOSIS — R079 Chest pain, unspecified: Secondary | ICD-10-CM

## 2018-10-05 MED ORDER — NITROGLYCERIN 0.4 MG SL SUBL
SUBLINGUAL_TABLET | SUBLINGUAL | Status: AC
  Filled 2018-10-05: qty 2

## 2018-10-05 MED ORDER — NITROGLYCERIN 0.4 MG SL SUBL
0.8000 mg | SUBLINGUAL_TABLET | Freq: Once | SUBLINGUAL | Status: DC
  Filled 2018-10-05: qty 25

## 2018-10-05 MED ORDER — METOPROLOL TARTRATE 5 MG/5ML IV SOLN
5.0000 mg | INTRAVENOUS | Status: DC | PRN
  Administered 2018-10-05 (×3): 5 mg via INTRAVENOUS
  Filled 2018-10-05 (×2): qty 5

## 2018-10-05 MED ORDER — METOPROLOL TARTRATE 5 MG/5ML IV SOLN
INTRAVENOUS | Status: AC
  Administered 2018-10-05: 5 mg via INTRAVENOUS
  Filled 2018-10-05: qty 20

## 2018-10-05 MED ORDER — IOPAMIDOL (ISOVUE-370) INJECTION 76%
100.0000 mL | Freq: Once | INTRAVENOUS | Status: AC | PRN
  Administered 2018-10-05: 100 mL via INTRAVENOUS

## 2018-10-07 ENCOUNTER — Encounter: Payer: Self-pay | Admitting: Psychology

## 2018-10-07 NOTE — Telephone Encounter (Signed)
Pt given information by Vita Barley, per Micheline Rough, "I  did try to review previous records. She has quite a complex history and there have been so many opinions/work ups in past. I do think following with psychiatry is important to help her process everything she is going through (I saw in last psychiatry note that she was not planning to see them anymore so if she wants to see someone else we can refer). She also had some significant anemia that we need to follow up on. I don't see that this has been further evaluated recently so I have ordered bloodwork that I do not see she has had in past to look at iron levels and products of B12 to further explore deficiencies as this can affect everything. I would like her to complete bloodwork this week (doesn't have to be fasting). I will call her with results and otherwise I think we start with her seeing ortho and take it from there.; the pt verbalized understanding.

## 2018-10-07 NOTE — Telephone Encounter (Signed)
CRM did not give permission for NT to speak with pt.  Spoke with pt and gave recommendations. She is scheduled to come in for labs tomorrow. She will continue with Psych but may want list of other providers. She was not able to see ortho she was referred to because they do not accept the [facility] assistance program. She needs to know who else she can be referred to.

## 2018-10-08 ENCOUNTER — Other Ambulatory Visit: Payer: Self-pay

## 2018-10-08 NOTE — Telephone Encounter (Signed)
Called pt left a message for the pt  to give our office a call need to talk with  her regarding her referral and who she should call to ask about the  [facility] assistance program  awaiting pt call

## 2018-10-08 NOTE — Telephone Encounter (Signed)
Do you know ortho that does accept the [facility] assistance program?

## 2018-10-11 ENCOUNTER — Other Ambulatory Visit (INDEPENDENT_AMBULATORY_CARE_PROVIDER_SITE_OTHER): Payer: Self-pay

## 2018-10-11 DIAGNOSIS — E876 Hypokalemia: Secondary | ICD-10-CM

## 2018-10-11 DIAGNOSIS — D649 Anemia, unspecified: Secondary | ICD-10-CM

## 2018-10-11 LAB — CBC WITH DIFFERENTIAL/PLATELET
Basophils Absolute: 0 10*3/uL (ref 0.0–0.1)
Basophils Relative: 1 % (ref 0.0–3.0)
Eosinophils Absolute: 0 10*3/uL (ref 0.0–0.7)
Eosinophils Relative: 0.4 % (ref 0.0–5.0)
HCT: 26.7 % — ABNORMAL LOW (ref 36.0–46.0)
Hemoglobin: 8.7 g/dL — ABNORMAL LOW (ref 12.0–15.0)
Lymphocytes Relative: 44.9 % (ref 12.0–46.0)
Lymphs Abs: 2 10*3/uL (ref 0.7–4.0)
MCHC: 32.7 g/dL (ref 30.0–36.0)
MCV: 80.6 fl (ref 78.0–100.0)
Monocytes Absolute: 0.3 10*3/uL (ref 0.1–1.0)
Monocytes Relative: 5.8 % (ref 3.0–12.0)
Neutro Abs: 2.1 10*3/uL (ref 1.4–7.7)
Neutrophils Relative %: 47.9 % (ref 43.0–77.0)
Platelets: 325 10*3/uL (ref 150.0–400.0)
RBC: 3.31 Mil/uL — ABNORMAL LOW (ref 3.87–5.11)
RDW: 18.3 % — ABNORMAL HIGH (ref 11.5–15.5)
WBC: 4.4 10*3/uL (ref 4.0–10.5)

## 2018-10-11 LAB — COMPREHENSIVE METABOLIC PANEL
ALT: 10 U/L (ref 0–35)
AST: 18 U/L (ref 0–37)
Albumin: 4 g/dL (ref 3.5–5.2)
Alkaline Phosphatase: 63 U/L (ref 39–117)
BUN: 11 mg/dL (ref 6–23)
CO2: 29 mEq/L (ref 19–32)
Calcium: 9 mg/dL (ref 8.4–10.5)
Chloride: 105 mEq/L (ref 96–112)
Creatinine, Ser: 0.65 mg/dL (ref 0.40–1.20)
GFR: 111.4 mL/min (ref >60.00)
Glucose, Bld: 80 mg/dL (ref 70–99)
Potassium: 3.9 mEq/L (ref 3.5–5.1)
Sodium: 141 mEq/L (ref 135–145)
Total Bilirubin: 0.6 mg/dL (ref 0.2–1.2)
Total Protein: 6.3 g/dL (ref 6.0–8.3)

## 2018-10-11 LAB — FERRITIN: Ferritin: 4.5 ng/mL — ABNORMAL LOW (ref 10.0–291.0)

## 2018-10-14 ENCOUNTER — Ambulatory Visit: Payer: Self-pay | Admitting: Physical Therapy

## 2018-10-14 LAB — IRON,TIBC AND FERRITIN PANEL
%SAT: 5 % (calc) — ABNORMAL LOW (ref 16–45)
Ferritin: 5 ng/mL — ABNORMAL LOW (ref 16–154)
Iron: 23 ug/dL — ABNORMAL LOW (ref 40–190)
TIBC: 476 mcg/dL (calc) — ABNORMAL HIGH (ref 250–450)

## 2018-10-14 LAB — HOMOCYSTEINE: Homocysteine: 36.6 umol/L — ABNORMAL HIGH (ref <10.4)

## 2018-10-14 LAB — METHYLMALONIC ACID, SERUM: Methylmalonic Acid, Quant: 119 nmol/L (ref 87–318)

## 2018-10-18 ENCOUNTER — Encounter: Payer: Self-pay | Admitting: Obstetrics & Gynecology

## 2018-10-18 ENCOUNTER — Ambulatory Visit (INDEPENDENT_AMBULATORY_CARE_PROVIDER_SITE_OTHER): Payer: Self-pay | Admitting: Obstetrics & Gynecology

## 2018-10-18 ENCOUNTER — Ambulatory Visit: Payer: Self-pay

## 2018-10-18 VITALS — BP 111/67 | HR 113 | Ht 70.0 in | Wt 157.7 lb

## 2018-10-18 DIAGNOSIS — N938 Other specified abnormal uterine and vaginal bleeding: Secondary | ICD-10-CM

## 2018-10-18 MED ORDER — NORETHINDRONE-MESTRANOL 1-50 MG-MCG PO TABS: 1.0000 | ORAL_TABLET | Freq: Every day | ORAL | 11 refills | Status: DC

## 2018-10-18 NOTE — Progress Notes (Signed)
Patient ID: [name], female   DOB: Feb 02, 1986, 32 y.o.   MRN: [id number]  Chief Complaint  Patient presents with  . Gynecologic Exam  abnormal bleeding  HPI [name] is a 32 y.o. female.  G1P0010 Patient's last menstrual period was 10/17/2018 (exact date). Despite taking Sprintec 2 tablets daily she continues to have daily flow that may stain her clothing. She has cramping pain as well. Mobic did not help pain. Recent blood work showed anemia HPI  Past Medical History:  Diagnosis Date  . Allergic rhinitis 01/24/2015  . Arrhythmia Dx 2015  . Back pain   . Breast mass   . Common migraine 04/15/2017  . Depression Dx 2000  . Fibromyalgia   . GERD (gastroesophageal reflux disease) 03/13/2015  . Headache    migraines  . Heart murmur Dx 2015  . 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 [place]pert[place] Dx [address]  . Lumbar radicular pain 11/22/2014  . Pain disorder 08/29/2017  . Panic attack Dx 2006  . Positive depression screening 08/29/2017  . PTSD (post-traumatic stress disorder) Dx 2013  . Seizures ([place]) 2001  . Vitamin D deficiency 01/24/2015    Past Surgical History:  Procedure Laterality Date  . BREAST BIOPSY Left   . ESOPHAGOGASTRODUODENOSCOPY N/A 08/14/2015   Procedure: ESOPHAGOGASTRODUODENOSCOPY (EGD);  Surgeon: Carol Ada, MD;  Location: [facility];  Service: Endoscopy;  Laterality: N/A;    Family History  Problem Relation Age of Onset  . Heart attack Mother   . Heart disease Mother   . Sudden death Mother   . Brain cancer Mother   . Sudden death Sister   . Heart attack Sister   . Sudden death Maternal Grandmother   . Fainting Maternal Grandmother   . Heart attack Maternal Grandmother   . Breast cancer Paternal Grandmother   . Breast cancer Cousin   . Breast cancer Paternal Aunt   . Seizures Maternal Aunt     Social History Social History   Tobacco Use  . Smoking status: Former Smoker    Last attempt to quit: 06/23/2012    Years since  quitting: 6.3  . Smokeless tobacco: Never Used  Substance Use Topics  . Alcohol use: Not Currently    Comment: rarely  . Drug use: No    Allergies  Allergen Reactions  . Aspirin Hives    Current Outpatient Medications  Medication Sig Dispense Refill  . diclofenac sodium (VOLTAREN) 1 % GEL Apply 2 g topically 4 (four) times daily. 100 g 1  . gabapentin (NEURONTIN) 300 MG capsule Take 1 cap in AM, 2 caps in PM 90 capsule 6  . lidocaine (LIDODERM) 5 % Place 1 patch onto the skin daily. Remove & Discard patch within 12 hours or as directed by MD 30 patch 0  . meloxicam (MOBIC) 7.5 MG tablet Take 1 tablet (7.5 mg total) by mouth daily. 30 tablet 0  . metoprolol succinate (TOPROL-XL) 100 MG 24 hr tablet Take 1 tablet (100 mg total) by mouth daily. 30 tablet 3  . norgestimate-ethinyl estradiol (ORTHO-CYCLEN,SPRINTEC,PREVIFEM) 0.25-35 MG-MCG tablet Take 1 tablet by mouth daily. 2 pills a day until no bleeding then 1 daily. 2 Package 5  . ondansetron (ZOFRAN) 4 MG tablet Take 1 tablet (4 mg total) by mouth every 6 (six) hours. 12 tablet 0  . Prenatal Vit-Fe Fumarate-FA (PRENATAL VITAMIN PO) Take 1 tablet by mouth daily.    . sertraline (ZOLOFT) 50 MG tablet Take 1 tablet (50  mg total) by mouth daily. 30 tablet 1  . Norethindrone-Mestranol (NECON) 1-50 MG-MCG tablet Take 1 tablet by mouth daily. Do not take placebo pill until further instruction. One twice a day until bleeding stops 1 Package 11   No current facility-administered medications for this visit.     Review of Systems Review of Systems  Constitutional: Positive for fatigue.  Gastrointestinal: Positive for abdominal distention (bloating, pressure).  Genitourinary: Positive for menstrual problem, pelvic pain and vaginal bleeding.    Blood pressure 111/67, pulse (!) 113, height 5\' 10"  (1.778 m), weight 157 lb 11.2 oz (71.5 kg), last menstrual period 10/17/2018.  Physical Exam Physical Exam  Constitutional: She appears  well-developed.  Pulmonary/Chest: Effort normal. No respiratory distress.    Data Reviewed [place] form 2018 CBC    Component Value Date/Time   WBC 4.4 10/11/2018 1304   RBC 3.31 (L) 10/11/2018 1304   HGB 8.7 Repeated and verified X2. (L) 10/11/2018 1304   HGB 10.8 (L) 04/28/2018 1438   HCT 26.7 Repeated and verified X2. (L) 10/11/2018 1304   HCT 31.5 (L) 04/28/2018 1438   PLT 325.0 10/11/2018 1304   PLT 278 04/28/2018 1438   MCV 80.6 10/11/2018 1304   MCV 87 04/28/2018 1438   MCH 26.5 09/08/2018 1721   MCHC 32.7 10/11/2018 1304   RDW 18.3 (H) 10/11/2018 1304   RDW 14.3 04/28/2018 1438   LYMPHSABS 2.0 10/11/2018 1304   LYMPHSABS 2.0 04/28/2018 1438   MONOABS 0.3 10/11/2018 1304   EOSABS 0.0 10/11/2018 1304   EOSABS 0.0 04/28/2018 1438   BASOSABS 0.0 10/11/2018 1304   BASOSABS 0.0 04/28/2018 1438     Assessment    Symptomatic anemia DUB unresponsive to OCP Uterine fibroids   Plan    Repeat pelvic US    Necon 1/50 BID until bleeding stops Fe supplement as Rx  RTC 4 weeks, may need hysteroscopy    [name] 10/18/2018, 11:44 AM

## 2018-10-18 NOTE — Progress Notes (Signed)
This month on 3rd period as of yesterday//pills have not helped..bleeding still heavy.

## 2018-10-18 NOTE — Patient Instructions (Signed)
Uterine Fibroids Uterine fibroids are tissue masses (tumors) that can develop in the womb (uterus). They are also called leiomyomas. This type of tumor is not cancerous (benign) and does not spread to other parts of the body outside of the pelvic area, which is between the hip bones. Occasionally, fibroids may develop in the fallopian tubes, in the cervix, or on the support structures (ligaments) that surround the uterus. You can have one or many fibroids. Fibroids can vary in size, weight, and where they grow in the uterus. Some can become quite large. Most fibroids do not require medical treatment. What are the causes? A fibroid can develop when a single uterine cell keeps growing (replicating). Most cells in the human body have a control mechanism that keeps them from replicating without control. What are the signs or symptoms? Symptoms may include:  Heavy bleeding during your period.  Bleeding or spotting between periods.  Pelvic pain and pressure.  Bladder problems, such as needing to urinate more often (urinary frequency) or urgently.  Inability to reproduce offspring (infertility).  Miscarriages.  How is this diagnosed? Uterine fibroids are diagnosed through a physical exam. Your health care provider may feel the lumpy tumors during a pelvic exam. Ultrasonography and an MRI may be done to determine the size, location, and number of fibroids. How is this treated? Treatment may include:  Watchful waiting. This involves getting the fibroid checked by your health care provider to see if it grows or shrinks. Follow your health care provider's recommendations for how often to have this checked.  Hormone medicines. These can be taken by mouth or given through an intrauterine device (IUD).  Surgery. ? Removing the fibroids (myomectomy) or the uterus (hysterectomy). ? Removing blood supply to the fibroids (uterine artery embolization).  If fibroids interfere with your fertility and you  want to become pregnant, your health care provider may recommend having the fibroids removed. Follow these instructions at home:  Keep all follow-up visits as directed by your health care provider. This is important.  Take over-the-counter and prescription medicines only as told by your health care provider. ? If you were prescribed a hormone treatment, take the hormone medicines exactly as directed.  Ask your health care provider about taking iron pills and increasing the amount of dark green, leafy vegetables in your diet. These actions can help to boost your blood iron levels, which may be affected by heavy menstrual bleeding.  Pay close attention to your period and tell your health care provider about any changes, such as: ? Increased blood flow that requires you to use more pads or tampons than usual per month. ? A change in the number of days that your period lasts per month. ? A change in symptoms that are associated with your period, such as abdominal cramping or back pain. Contact a health care provider if:  You have pelvic pain, back pain, or abdominal cramps that cannot be controlled with medicines.  You have an increase in bleeding between and during periods.  You soak tampons or pads in a half hour or less.  You feel lightheaded, extra tired, or weak. Get help right away if:  You faint.  You have a sudden increase in pelvic pain. This information is not intended to replace advice given to you by your health care provider. Make sure you discuss any questions you have with your health care provider. Document Released: 11/07/2000 Document Revised: 07/10/2016 Document Reviewed: 05/09/2014 Elsevier Interactive Patient Education  2018 Elsevier Inc.  

## 2018-10-19 ENCOUNTER — Telehealth: Payer: Self-pay

## 2018-10-19 ENCOUNTER — Other Ambulatory Visit: Payer: Self-pay | Admitting: Obstetrics & Gynecology

## 2018-10-19 DIAGNOSIS — D251 Intramural leiomyoma of uterus: Secondary | ICD-10-CM

## 2018-10-19 MED ORDER — MEGESTROL ACETATE 40 MG PO TABS: 40.0000 mg | ORAL_TABLET | Freq: Two times a day (BID) | ORAL | 3 refills | Status: DC

## 2018-10-19 MED FILL — MEGESTROL 40 MG TABLET: 40 | 15 days supply | Qty: 30 | Fill #0

## 2018-10-19 NOTE — Telephone Encounter (Signed)
Patient's pharmacy called to let us know that Necon is no longer made (brand name or generic) and asked if we could send something else in for the patient.

## 2018-10-19 NOTE — Progress Notes (Signed)
Necon not available, will change to Megace 40 mg BID

## 2018-10-19 NOTE — Telephone Encounter (Signed)
I sent a prescription for Megace for this patient

## 2018-10-25 ENCOUNTER — Ambulatory Visit (HOSPITAL_COMMUNITY)
Admission: RE | Admit: 2018-10-25 | Discharge: 2018-10-25 | Disposition: A | Discharge status: Home / Self Care | Payer: Self-pay | Source: Ambulatory Visit | Attending: Obstetrics & Gynecology | Admitting: Obstetrics & Gynecology

## 2018-10-25 DIAGNOSIS — N938 Other specified abnormal uterine and vaginal bleeding: Secondary | ICD-10-CM | POA: Insufficient documentation

## 2018-10-26 ENCOUNTER — Ambulatory Visit: Payer: Self-pay | Attending: Family Medicine

## 2018-10-26 ENCOUNTER — Ambulatory Visit: Payer: Self-pay | Admitting: Physical Therapy

## 2018-10-27 ENCOUNTER — Encounter: Payer: Self-pay | Admitting: Hematology and Oncology

## 2018-10-27 ENCOUNTER — Telehealth: Payer: Self-pay | Admitting: Hematology and Oncology

## 2018-10-27 NOTE — Telephone Encounter (Signed)
Pt has been cld and scheduled to see Dr. Lindi Adie on 12/30 at 1pm. Pt aware to arrive 30 minutes early. Letter mailed.

## 2018-10-28 ENCOUNTER — Encounter

## 2018-10-28 ENCOUNTER — Ambulatory Visit: Payer: Self-pay | Admitting: Psychology

## 2018-10-29 ENCOUNTER — Ambulatory Visit: Payer: Self-pay | Admitting: Physical Therapy

## 2018-11-02 ENCOUNTER — Encounter

## 2018-11-02 ENCOUNTER — Ambulatory Visit: Payer: Self-pay | Admitting: Psychology

## 2018-11-04 ENCOUNTER — Encounter: Payer: Self-pay | Attending: Physical Medicine & Rehabilitation | Admitting: Psychology

## 2018-11-04 DIAGNOSIS — Z8249 Family history of ischemic heart disease and other diseases of the circulatory system: Secondary | ICD-10-CM | POA: Insufficient documentation

## 2018-11-04 DIAGNOSIS — Z9889 Other specified postprocedural states: Secondary | ICD-10-CM | POA: Insufficient documentation

## 2018-11-04 DIAGNOSIS — M797 Fibromyalgia: Secondary | ICD-10-CM | POA: Insufficient documentation

## 2018-11-04 DIAGNOSIS — Z87891 Personal history of nicotine dependence: Secondary | ICD-10-CM | POA: Insufficient documentation

## 2018-11-04 DIAGNOSIS — F4310 Post-traumatic stress disorder, unspecified: Secondary | ICD-10-CM | POA: Insufficient documentation

## 2018-11-04 DIAGNOSIS — F329 Major depressive disorder, single episode, unspecified: Secondary | ICD-10-CM | POA: Insufficient documentation

## 2018-11-04 DIAGNOSIS — M545 Low back pain: Secondary | ICD-10-CM | POA: Insufficient documentation

## 2018-11-04 DIAGNOSIS — Z803 Family history of malignant neoplasm of breast: Secondary | ICD-10-CM | POA: Insufficient documentation

## 2018-11-04 DIAGNOSIS — G8929 Other chronic pain: Secondary | ICD-10-CM | POA: Insufficient documentation

## 2018-11-04 DIAGNOSIS — I10 Essential (primary) hypertension: Secondary | ICD-10-CM | POA: Insufficient documentation

## 2018-11-04 DIAGNOSIS — Z820 Family history of epilepsy and other diseases of the nervous system: Secondary | ICD-10-CM | POA: Insufficient documentation

## 2018-11-04 DIAGNOSIS — K219 Gastro-esophageal reflux disease without esophagitis: Secondary | ICD-10-CM | POA: Insufficient documentation

## 2018-11-08 ENCOUNTER — Ambulatory Visit (INDEPENDENT_AMBULATORY_CARE_PROVIDER_SITE_OTHER): Payer: Self-pay | Admitting: Obstetrics & Gynecology

## 2018-11-08 ENCOUNTER — Ambulatory Visit: Payer: Self-pay | Admitting: Psychology

## 2018-11-08 ENCOUNTER — Telehealth: Payer: Self-pay

## 2018-11-08 ENCOUNTER — Encounter: Payer: Self-pay | Admitting: Obstetrics & Gynecology

## 2018-11-08 VITALS — BP 144/90 | HR 134 | Wt 154.0 lb

## 2018-11-08 DIAGNOSIS — R102 Pelvic and perineal pain: Secondary | ICD-10-CM | POA: Insufficient documentation

## 2018-11-08 DIAGNOSIS — D250 Submucous leiomyoma of uterus: Secondary | ICD-10-CM

## 2018-11-08 DIAGNOSIS — N938 Other specified abnormal uterine and vaginal bleeding: Secondary | ICD-10-CM

## 2018-11-08 MED ORDER — LEUPROLIDE ACETATE (3 MONTH) 11.25 MG IM KIT: 11.2500 mg | PACK | Freq: Once | INTRAMUSCULAR | Status: DC

## 2018-11-08 MED ORDER — NAPROXEN 500 MG PO TBEC: 500.0000 mg | DELAYED_RELEASE_TABLET | Freq: Three times a day (TID) | ORAL | 1 refills | Status: DC

## 2018-11-08 NOTE — Telephone Encounter (Signed)
Shayla from [facility] called wanting to know if it was okay to change pt rx from Naproxen 500 mg EC to the regular Naproxen. She states that they only carry the regular Naproxen in their stock. Please advise

## 2018-11-08 NOTE — Progress Notes (Signed)
Patient ID: [name], female   DOB: 08/14/1986, 32 y.o.   MRN: [id number]  Chief Complaint  Patient presents with  . Follow-up    HPI [name] is a 32 y.o. female.  G1P0010 Patient's last menstrual period was 10/17/2018 (exact date). Still with irregular vaginal bleeding and daily pelvic pain and pressure radiating to her thighs. Still taking Megace. [place] was done 2 weeks ago showing possible submucosal fibroid  HPI  Past Medical History:  Diagnosis Date  . Allergic rhinitis 01/24/2015  . Arrhythmia Dx 2015  . Back pain   . Breast mass   . Common migraine 04/15/2017  . Depression Dx 2000  . Fibromyalgia   . GERD (gastroesophageal reflux disease) 03/13/2015  . Headache    migraines  . Heart murmur Dx 2015  . 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 [place]pert[place] Dx [address]  . Lumbar radicular pain 11/22/2014  . Pain disorder 08/29/2017  . Panic attack Dx 2006  . Positive depression screening 08/29/2017  . PTSD (post-traumatic stress disorder) Dx 2013  . Seizures ([place]) 2001  . Vitamin D deficiency 01/24/2015    Past Surgical History:  Procedure Laterality Date  . BREAST BIOPSY Left   . ESOPHAGOGASTRODUODENOSCOPY N/A 08/14/2015   Procedure: ESOPHAGOGASTRODUODENOSCOPY (EGD);  Surgeon: Carol Ada, MD;  Location: [facility];  Service: Endoscopy;  Laterality: N/A;    Family History  Problem Relation Age of Onset  . Heart attack Mother   . Heart disease Mother   . Sudden death Mother   . Brain cancer Mother   . Sudden death Sister   . Heart attack Sister   . Sudden death Maternal Grandmother   . Fainting Maternal Grandmother   . Heart attack Maternal Grandmother   . Breast cancer Paternal Grandmother   . Breast cancer Cousin   . Breast cancer Paternal Aunt   . Seizures Maternal Aunt     Social History Social History   Tobacco Use  . Smoking status: Former Smoker    Last attempt to quit: 06/23/2012    Years since quitting: 6.3  . Smokeless  tobacco: Never Used  Substance Use Topics  . Alcohol use: Not Currently    Comment: rarely  . Drug use: No    Allergies  Allergen Reactions  . Aspirin Hives    Current Outpatient Medications  Medication Sig Dispense Refill  . gabapentin (NEURONTIN) 300 MG capsule Take 1 cap in AM, 2 caps in PM 90 capsule 6  . metoprolol succinate (TOPROL-XL) 100 MG 24 hr tablet Take 1 tablet (100 mg total) by mouth daily. 30 tablet 3  . sertraline (ZOLOFT) 50 MG tablet Take 1 tablet (50 mg total) by mouth daily. 30 tablet 1  . diclofenac sodium (VOLTAREN) 1 % GEL Apply 2 g topically 4 (four) times daily. (Patient not taking: Reported on 11/08/2018) 100 g 1  . lidocaine (LIDODERM) 5 % Place 1 patch onto the skin daily. Remove & Discard patch within 12 hours or as directed by MD (Patient not taking: Reported on 11/08/2018) 30 patch 0  . megestrol (MEGACE) 40 MG tablet Take 1 tablet (40 mg total) by mouth 2 (two) times daily. (Patient not taking: Reported on 11/08/2018) 30 tablet 3  . meloxicam (MOBIC) 7.5 MG tablet Take 1 tablet (7.5 mg total) by mouth daily. (Patient not taking: Reported on 11/08/2018) 30 tablet 0  . naproxen (EC NAPROSYN) 500 MG EC tablet Take 1 tablet (500 mg total) by mouth  3 (three) times daily with meals. 60 tablet 1  . Norethindrone-Mestranol (NECON) 1-50 MG-MCG tablet Take 1 tablet by mouth daily. Do not take placebo pill until further instruction. One twice a day until bleeding stops (Patient not taking: Reported on 11/08/2018) 1 Package 11  . norgestimate-ethinyl estradiol (ORTHO-CYCLEN,SPRINTEC,PREVIFEM) 0.25-35 MG-MCG tablet Take 1 tablet by mouth daily. 2 pills a day until no bleeding then 1 daily. (Patient not taking: Reported on 11/08/2018) 2 Package 5  . ondansetron (ZOFRAN) 4 MG tablet Take 1 tablet (4 mg total) by mouth every 6 (six) hours. (Patient not taking: Reported on 11/08/2018) 12 tablet 0  . Prenatal Vit-Fe Fumarate-FA (PRENATAL VITAMIN PO) Take 1 tablet by mouth  daily.     Current Facility-Administered Medications  Medication Dose Route Frequency Provider Last Rate Last Dose  . leuprolide (LUPRON) injection 11.25 mg  11.25 mg Intramuscular Once Woodroe Mode, MD        Review of Systems Review of Systems  Respiratory: Negative.   Gastrointestinal: Positive for abdominal distention.  Genitourinary: Positive for menstrual problem, pelvic pain, vaginal bleeding and vaginal discharge. Negative for dysuria.    Blood pressure (!) 144/90, pulse (!) 134, weight 154 lb (69.9 kg), last menstrual period 10/17/2018.  Physical Exam Physical Exam Vitals signs and nursing note reviewed.  Constitutional:      Appearance: She is ill-appearing (uncomfortable).  HENT:     Head: Normocephalic.  Cardiovascular:     Rate and Rhythm: Regular rhythm.  Pulmonary:     Effort: Pulmonary effort is normal. No respiratory distress.  Neurological:     Mental Status: She is alert.     Data Reviewed CLINICAL DATA:  Patient with dysfunctional uterine bleeding.  EXAM: TRANSABDOMINAL AND TRANSVAGINAL ULTRASOUND OF PELVIS  TECHNIQUE: Both transabdominal and transvaginal ultrasound examinations of the pelvis were performed. Transabdominal technique was performed for global imaging of the pelvis including uterus, ovaries, adnexal regions, and pelvic cul-de-sac. It was necessary to proceed with endovaginal exam following the transabdominal exam to visualize the endometrium.  COMPARISON:  CT abdomen pelvis 02/07/2018; pelvic ultrasound 04/15/2017  FINDINGS: Uterus  Measurements: 7.1 x 4.4 x 4.9 cm = volume: 78.8 mL. There is a 3.5 x 4.2 x 3.2 cm heterogeneous mass within the mid uterine body with multiple areas of posterior acoustic shadowing, favored to represent a fibroid. Overall the exact location is difficult to determine however this may represent a submucosal or pedunculated fibroid.  Endometrium  Thickness: 9 mm.  No focal abnormality  visualized.  Right ovary  Measurements: 3.0 x 1.6 x 2.8 cm = volume: 7.2 mL. Normal appearance/no adnexal mass.  Left ovary  Measurements: 4.7 x 2.8 x 3.0 cm = volume: 21.1 mL. Normal appearance/no adnexal mass.  Other findings  No abnormal free fluid.  IMPRESSION: 1. Heterogeneous mass within the mid uterine body has sonographic characteristics suggestive of uterine fibroid. The exact location is difficult to determine however this may represent a pedunculated fibroid, or potentially a submucosal fibroid. Consider further evaluation of the uterus with MRI as clinically indicated. Given the location of this mass relative to the endometrium, this is likely the source of bleeding. 2. Consider further evaluation with sonohysterogram for confirmation prior to hysteroscopy. Endometrial sampling should also be considered if patient is at high risk for endometrial carcinoma. (Ref: Radiological Reasoning: Algorithmic Workup of Abnormal Vaginal Bleeding with Endovaginal Sonography and Sonohysterography. AJR 2008; 612:A44-97)   Electronically Signed   By: Lovey Newcomer M.D.   On: 10/25/2018 14:37  Result History      Assessment    Pelvic pain, DUB and fibroid uterus Possible endometriosis    Plan    Continue Megace Lupron 11.25 mg IM when available Financial assistance application is pending. She is a candidate for hysteroscopy and dx laparoscopy       [name] 11/08/2018, 12:19 PM

## 2018-11-09 NOTE — Telephone Encounter (Signed)
The switch is fine

## 2018-11-10 ENCOUNTER — Ambulatory Visit: Payer: Self-pay | Attending: Family Medicine | Admitting: Licensed Clinical Social Worker

## 2018-11-10 ENCOUNTER — Telehealth: Payer: Self-pay | Admitting: Family Medicine

## 2018-11-10 DIAGNOSIS — F439 Reaction to severe stress, unspecified: Secondary | ICD-10-CM

## 2018-11-10 MED FILL — NAPROXEN 500 MG TABLET: 500 | 20 days supply | Qty: 60 | Fill #0

## 2018-11-10 NOTE — Telephone Encounter (Signed)
Pt was call an inform that the [place] letter will be at the [place]

## 2018-11-10 NOTE — Telephone Encounter (Signed)
Pharmacy aware

## 2018-11-10 NOTE — Telephone Encounter (Addendum)
Patient came in and says she has a complaint for her provider and would like to know who that would go to.  Will be following up with [employer] to see who this message would go to.  EDIT: Followed up with Truitt Merle regarding patients complaint.

## 2018-11-10 NOTE — Telephone Encounter (Signed)
Patient came in to get a copy of her [place] letter. Patient says she needs the letter for an upcoming surgery. Please follow up.

## 2018-11-15 NOTE — Telephone Encounter (Signed)
Attempted to call patient to talk to her about her complaint. LVM to return call.

## 2018-11-15 NOTE — Telephone Encounter (Signed)
Followed up with [name] regarding patients complaint through e-mail (provided phone number and MRN).

## 2018-11-15 NOTE — BH Specialist Note (Signed)
Pt shared with LCSWA that she has multiple complaints with the [facility] system and is currently interested in pursuing legal action. Pt requested referral to Legal Aid. Completed referral was faxed to [phone]

## 2018-11-22 ENCOUNTER — Telehealth: Payer: Self-pay | Admitting: Hematology and Oncology

## 2018-11-22 ENCOUNTER — Inpatient Hospital Stay: Payer: Self-pay | Attending: Hematology and Oncology | Admitting: Hematology and Oncology

## 2018-11-22 ENCOUNTER — Telehealth: Payer: Self-pay

## 2018-11-22 DIAGNOSIS — I10 Essential (primary) hypertension: Secondary | ICD-10-CM | POA: Insufficient documentation

## 2018-11-22 DIAGNOSIS — D500 Iron deficiency anemia secondary to blood loss (chronic): Secondary | ICD-10-CM | POA: Insufficient documentation

## 2018-11-22 DIAGNOSIS — N939 Abnormal uterine and vaginal bleeding, unspecified: Secondary | ICD-10-CM | POA: Insufficient documentation

## 2018-11-22 DIAGNOSIS — Z87891 Personal history of nicotine dependence: Secondary | ICD-10-CM | POA: Insufficient documentation

## 2018-11-22 DIAGNOSIS — F4310 Post-traumatic stress disorder, unspecified: Secondary | ICD-10-CM | POA: Insufficient documentation

## 2018-11-22 DIAGNOSIS — F329 Major depressive disorder, single episode, unspecified: Secondary | ICD-10-CM | POA: Insufficient documentation

## 2018-11-22 DIAGNOSIS — Z79899 Other long term (current) drug therapy: Secondary | ICD-10-CM | POA: Insufficient documentation

## 2018-11-22 DIAGNOSIS — D259 Leiomyoma of uterus, unspecified: Secondary | ICD-10-CM | POA: Insufficient documentation

## 2018-11-22 NOTE — Telephone Encounter (Signed)
Per Dr. Geralyn Flash recommendations referral information faxed to referral coordinator for Dr. Delsa Sale @ [phone].

## 2018-11-22 NOTE — Assessment & Plan Note (Signed)
Profound iron deficiency anemia due to chronic blood loss from the uterus since May 2019 Uterine ultrasound revealed fibroid Patient wants to have children and wants minimal invasive surgery.  Lab review: Hemoglobin 8.7, MCV 80, ferritin 5, iron saturation 5%  Treatment plan: 1.  Severe iron deficiency not responded to oral iron therapy.  Plan to give 2 doses of IV iron 1 week apart. 2.  Recheck labs in 6 weeks I discussed with the patient that she may need iron infusions periodically in the future depending on how much bleeding she is experiencing.  3.  Patient wants a second opinion on minimally invasive surgical approaches for the uterine fibroids because she wants to have children.  She would like [place] to refer the patient to [facility].  Based on her wishes I am sending a referral.  Major depression  Return to clinic in 6 weeks for follow-up.

## 2018-11-22 NOTE — Telephone Encounter (Signed)
Gave avs and calendar ° °

## 2018-11-22 NOTE — Progress Notes (Signed)
[facility] CONSULT NOTE  Patient Care Team: [name], MD as PCP - General (Family Medicine)  CHIEF COMPLAINTS/PURPOSE OF CONSULTATION:  Severe iron deficiency anemia  HISTORY OF PRESENTING ILLNESS:  [name] 32 y.o. female is here because of recent diagnosis of severe iron deficiency anemia.  Patient has been having recurrent uterine bleeding since May 2019.  She has been feeling weak dizzy lightheaded for the past several months.  She has been on oral iron therapy since at least a year.  Recent labs done a month ago revealed a hemoglobin of 8.7 with a ferritin of 5 and iron saturation of 5%.  Because of this she was referred to [place] for evaluation.  Patient appears to be extremely fatigued and is here accompanied by her partner.  She reports profound ice cravings as well as cravings to cornstarch.  She cannot function apart from her day-to-day activities. For the uterine bleeding her gynecologist has been trying to stop the bleeding but has been unsuccessful.  She does not want a hysterectomy and wants to have children.  Therefore she is requesting [place] to consult another gynecologist through the [facility] system for minimally invasive fibroidectomy. I reviewed her records extensively and collaborated the history with the patient.  MEDICAL HISTORY:  Past Medical History:  Diagnosis Date  . Allergic rhinitis 01/24/2015  . Arrhythmia Dx 2015  . Back pain   . Breast mass   . Common migraine 04/15/2017  . Depression Dx 2000  . Fibromyalgia   . GERD (gastroesophageal reflux disease) 03/13/2015  . Headache    migraines  . Heart murmur Dx 2015  . 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 Hypertension Dx 244[address]  . Lumbar radicular pain 11/22/2014  . Pain disorder 08/29/2017  . Panic attack Dx 2006  . Positive depression screening 08/29/2017  . PTSD (post-traumatic stress disorder) Dx 2013  . Seizures ([place]) 2001  . Vitamin D deficiency 01/24/2015    SURGICAL  HISTORY: Past Surgical History:  Procedure Laterality Date  . BREAST BIOPSY Left   . ESOPHAGOGASTRODUODENOSCOPY N/A 08/14/2015   Procedure: ESOPHAGOGASTRODUODENOSCOPY (EGD);  Surgeon: Carol Ada, MD;  Location: [facility];  Service: Endoscopy;  Laterality: N/A;    SOCIAL HISTORY: Social History   Socioeconomic History  . Marital status: Single    Spouse name: Not on file  . Number of children: Not on file  . Years of education: 64  . Highest education level: Not on file  Occupational History  . Not on file  Social Needs  . Financial resource strain: Not on file  . Food insecurity:    Worry: Not on file    Inability: Not on file  . Transportation needs:    Medical: Not on file    Non-medical: Not on file  Tobacco Use  . Smoking status: Former Smoker    Last attempt to quit: 06/23/2012    Years since quitting: 6.4  . Smokeless tobacco: Never Used  Substance and Sexual Activity  . Alcohol use: Not Currently    Comment: rarely  . Drug use: No  . Sexual activity: Not Currently    Birth control/protection: None    Comment: last 2010  Lifestyle  . Physical activity:    Days per week: Not on file    Minutes per session: Not on file  . Stress: Not on file  Relationships  . Social connections:    Talks on phone: Not on file    Gets  together: Not on file    Attends religious service: Not on file    Active member of club or organization: Not on file    Attends meetings of clubs or organizations: Not on file    Relationship status: Not on file  . Intimate partner violence:    Fear of current or ex partner: Not on file    Emotionally abused: Not on file    Physically abused: Not on file    Forced sexual activity: Not on file  Other Topics Concern  . Not on file  Social History Narrative   Patient is single, currently raising her nephews   Patient is right handed   Patient's education level is high school graduate   Patient doesn't drink caffeine         02/05/18:    Pt lives in 2 story home with her friend   States she has 4 years of college   Worked as CSR for [employer]: Family History  Problem Relation Age of Onset  . Heart attack Mother   . Heart disease Mother   . Sudden death Mother   . Brain cancer Mother   . Sudden death Sister   . Heart attack Sister   . Sudden death Maternal Grandmother   . Fainting Maternal Grandmother   . Heart attack Maternal Grandmother   . Breast cancer Paternal Grandmother   . Breast cancer Cousin   . Breast cancer Paternal Aunt   . Seizures Maternal Aunt     ALLERGIES:  is allergic to aspirin.  MEDICATIONS:  Current Outpatient Medications  Medication Sig Dispense Refill  . diclofenac sodium (VOLTAREN) 1 % GEL Apply 2 g topically 4 (four) times daily. (Patient not taking: Reported on 11/08/2018) 100 g 1  . gabapentin (NEURONTIN) 300 MG capsule Take 1 cap in AM, 2 caps in PM 90 capsule 6  . lidocaine (LIDODERM) 5 % Place 1 patch onto the skin daily. Remove & Discard patch within 12 hours or as directed by MD (Patient not taking: Reported on 11/08/2018) 30 patch 0  . megestrol (MEGACE) 40 MG tablet Take 1 tablet (40 mg total) by mouth 2 (two) times daily. (Patient not taking: Reported on 11/08/2018) 30 tablet 3  . meloxicam (MOBIC) 7.5 MG tablet Take 1 tablet (7.5 mg total) by mouth daily. (Patient not taking: Reported on 11/08/2018) 30 tablet 0  . metoprolol succinate (TOPROL-XL) 100 MG 24 hr tablet Take 1 tablet (100 mg total) by mouth daily. 30 tablet 3  . naproxen (EC NAPROSYN) 500 MG EC tablet Take 1 tablet (500 mg total) by mouth 3 (three) times daily with meals. 60 tablet 1  . Norethindrone-Mestranol (NECON) 1-50 MG-MCG tablet Take 1 tablet by mouth daily. Do not take placebo pill until further instruction. One twice a day until bleeding stops (Patient not taking: Reported on 11/08/2018) 1 Package 11  . norgestimate-ethinyl estradiol (ORTHO-CYCLEN,SPRINTEC,PREVIFEM) 0.25-35  MG-MCG tablet Take 1 tablet by mouth daily. 2 pills a day until no bleeding then 1 daily. (Patient not taking: Reported on 11/08/2018) 2 Package 5  . ondansetron (ZOFRAN) 4 MG tablet Take 1 tablet (4 mg total) by mouth every 6 (six) hours. (Patient not taking: Reported on 11/08/2018) 12 tablet 0  . Prenatal Vit-Fe Fumarate-FA (PRENATAL VITAMIN PO) Take 1 tablet by mouth daily.    . sertraline (ZOLOFT) 50 MG tablet Take 1 tablet (50 mg total) by mouth daily. 30 tablet 1   Current  Facility-Administered Medications  Medication Dose Route Frequency Provider Last Rate Last Dose  . leuprolide (LUPRON) injection 11.25 mg  11.25 mg Intramuscular Once Woodroe Mode, MD        REVIEW OF SYSTEMS:   Constitutional: Denies fevers, chills or abnormal night sweats Eyes: Denies blurriness of vision, double vision or watery eyes Ears, nose, mouth, throat, and face: Denies mucositis or sore throat Respiratory: Denies cough, dyspnea or wheezes Cardiovascular: Denies palpitation, chest discomfort or lower extremity swelling Gastrointestinal:  Denies nausea, heartburn or change in bowel habits Skin: Denies abnormal skin rashes Lymphatics: Denies new lymphadenopathy or easy bruising Neurological:Denies numbness, tingling or new weaknesses Behavioral/Psych: Depression   All other systems were reviewed with the patient and are negative.  PHYSICAL EXAMINATION: ECOG PERFORMANCE STATUS: 2 - Symptomatic, <50% confined to bed  Vitals:   11/22/18 1313  BP: (!) 112/59  Pulse: (!) 111  Resp: 20  Temp: 98.6 F (37 C)  SpO2: 100%   Filed Weights   11/22/18 1313  Weight: 158 lb 3.2 oz (71.8 kg)    GENERAL:alert, no distress and comfortable SKIN: skin color, texture, turgor are normal, no rashes or significant lesions EYES: normal, conjunctiva are pink and non-injected, sclera clear OROPHARYNX:no exudate, no erythema and lips, buccal mucosa, and tongue normal  NECK: supple, thyroid normal size,  non-tender, without nodularity LYMPH:  no palpable lymphadenopathy in the cervical, axillary or inguinal LUNGS: clear to auscultation and percussion with normal breathing effort HEART: regular rate & rhythm and no murmurs and no lower extremity edema ABDOMEN:abdomen soft, non-tender and normal bowel sounds Musculoskeletal:no cyanosis of digits and no clubbing  PSYCH: alert & oriented x 3 with fluent speech NEURO: no focal motor/sensory deficits   LABORATORY DATA:  I have reviewed the data as listed Lab Results  Component Value Date   WBC 4.4 10/11/2018   HGB 8.7 Repeated and verified X2. (L) 10/11/2018   HCT 26.7 Repeated and verified X2. (L) 10/11/2018   MCV 80.6 10/11/2018   PLT 325.0 10/11/2018   Lab Results  Component Value Date   NA 141 10/11/2018   K 3.9 10/11/2018   CL 105 10/11/2018   CO2 29 10/11/2018    RADIOGRAPHIC STUDIES: I have personally reviewed the radiological reports and agreed with the findings in the report.  ASSESSMENT AND PLAN:  Iron deficiency anemia due to chronic blood loss Profound iron deficiency anemia due to chronic blood loss from the uterus since May 2019 Uterine ultrasound revealed fibroid Patient wants to have children and wants minimal invasive surgery.  Lab review: Hemoglobin 8.7, MCV 80, ferritin 5, iron saturation 5%  Treatment plan: 1.  Severe iron deficiency not responded to oral iron therapy.  Plan to give 2 doses of IV iron 1 week apart. 2.  Recheck labs in 6 weeks I discussed with the patient that she may need iron infusions periodically in the future depending on how much bleeding she is experiencing.  3.  Patient wants a second opinion on minimally invasive surgical approaches for the uterine fibroids because she wants to have children.  She would like [place] to refer the patient to [facility].  Based on her wishes I am sending a referral.  Major depression  Return to clinic in 6 weeks for follow-up.   All questions were answered.  The patient knows to call the clinic with any problems, questions or concerns.    Harriette Ohara, MD 11/22/18

## 2018-11-23 ENCOUNTER — Ambulatory Visit (HOSPITAL_COMMUNITY)
Admission: RE | Admit: 2018-11-23 | Discharge: 2018-11-23 | Disposition: A | Discharge status: Home / Self Care | Payer: Self-pay | Source: Ambulatory Visit | Attending: Hematology and Oncology | Admitting: Hematology and Oncology

## 2018-11-23 DIAGNOSIS — D500 Iron deficiency anemia secondary to blood loss (chronic): Secondary | ICD-10-CM | POA: Insufficient documentation

## 2018-11-23 MED ORDER — SODIUM CHLORIDE 0.9 % IV SOLN
INTRAVENOUS | Status: DC | PRN
  Administered 2018-11-23: 250 mL via INTRAVENOUS

## 2018-11-23 MED ORDER — SODIUM CHLORIDE 0.9 % IV SOLN
510.0000 mg | Freq: Once | INTRAVENOUS | Status: AC
  Administered 2018-11-23: 510 mg via INTRAVENOUS
  Filled 2018-11-23: qty 17

## 2018-11-23 NOTE — Progress Notes (Signed)
Patient received Feraheme via a PIV. Observed for at least 30 minutes post infusion.Tolerated well, vitals stable, discharge instructions given, verbalized understanding. Patient alert, oriented and ambulatory at the time of discharge.  

## 2018-11-23 NOTE — Discharge Instructions (Signed)

## 2018-11-26 ENCOUNTER — Telehealth: Payer: Self-pay

## 2018-11-26 NOTE — Telephone Encounter (Addendum)
Called pt in regards to her Depo Lupron application.  I asked pt if she would be able to come in on Monday to complete her application so that she will get approval.  Pt stated okay and then began to get upset that she felt that we have dropped the ball in regards to managing her care of fibroids.  Pt stated that she had spoken with someone from patient experience and was informed that she would be referred to another GYN office in [place] or [place].  Pt was upset because she has not heard anything about the referral.  That our office printed off her records and handed them to her at her last visit with Dr. Roselie Awkward.  I explained to the pt that I will work on getting her a referral if I could call her back.  Pt agreed.    [place] office at [phone] and was informed that the appt has been scheduled for January 16th @ 1300.    Called pt and gave pt information to appt with address and telephone # so that she can follow up with them with questions. I confirmed with pt if she wanted [place] to send off for Depo Lupron, pt answered no she did not want me to send application for Depo Lupron.   I also advised pt to take the records that we printed for her to her appt. Pt stated thank you with no further questions.

## 2018-11-30 ENCOUNTER — Ambulatory Visit (HOSPITAL_COMMUNITY)
Admission: RE | Admit: 2018-11-30 | Discharge: 2018-11-30 | Disposition: A | Discharge status: Home / Self Care | Payer: Self-pay | Source: Ambulatory Visit | Attending: Hematology and Oncology | Admitting: Hematology and Oncology

## 2018-11-30 DIAGNOSIS — D509 Iron deficiency anemia, unspecified: Secondary | ICD-10-CM | POA: Insufficient documentation

## 2018-11-30 MED ORDER — SODIUM CHLORIDE 0.9 % IV SOLN
Freq: Once | INTRAVENOUS | Status: AC
  Administered 2018-11-30: 14:00:00 via INTRAVENOUS

## 2018-11-30 MED ORDER — SODIUM CHLORIDE 0.9 % IV SOLN
510.0000 mg | Freq: Once | INTRAVENOUS | Status: AC
  Administered 2018-11-30: 510 mg via INTRAVENOUS
  Filled 2018-11-30: qty 17

## 2018-11-30 NOTE — Discharge Instructions (Signed)

## 2018-11-30 NOTE — Progress Notes (Signed)
Patient received Feraheme  via PIV as ordered. Observed for at least 30 minutes post infusion.Tolerated well, vitals stable, discharge instructions given, verbalized understanding. Patient alert, oriented and ambulatory at the time of discharge.  

## 2018-11-30 NOTE — Progress Notes (Signed)
PATIENT CARE CENTER NOTE  Diagnosis: Iron Deficiency Anemia    Provider: Dr. Lindi Adie    Procedure: IV Feraheme   Note: Patient received Feraheme infusion. Tolerated well with no adverse reaction. Observed patient for 30 minutes post-infusion. Vital signs stable. Patient alert, oriented and ambulatory at discharge.

## 2018-12-01 ENCOUNTER — Ambulatory Visit (INDEPENDENT_AMBULATORY_CARE_PROVIDER_SITE_OTHER): Payer: Self-pay | Admitting: Family Medicine

## 2018-12-01 ENCOUNTER — Telehealth: Payer: Self-pay | Admitting: Family Medicine

## 2018-12-01 ENCOUNTER — Other Ambulatory Visit: Payer: Self-pay

## 2018-12-01 ENCOUNTER — Encounter: Payer: Self-pay | Admitting: Family Medicine

## 2018-12-01 VITALS — BP 120/58 | HR 80 | Temp 98.5°F | Wt 157.9 lb

## 2018-12-01 DIAGNOSIS — D500 Iron deficiency anemia secondary to blood loss (chronic): Secondary | ICD-10-CM

## 2018-12-01 DIAGNOSIS — R1084 Generalized abdominal pain: Secondary | ICD-10-CM

## 2018-12-01 DIAGNOSIS — D729 Disorder of white blood cells, unspecified: Secondary | ICD-10-CM

## 2018-12-01 LAB — CBC WITH DIFFERENTIAL/PLATELET
Basophils Absolute: 0.1 10*3/uL (ref 0.0–0.1)
Basophils Relative: 1.2 % (ref 0.0–3.0)
Eosinophils Absolute: 0 10*3/uL (ref 0.0–0.7)
Eosinophils Relative: 0.2 % (ref 0.0–5.0)
HCT: 28.3 % — ABNORMAL LOW (ref 36.0–46.0)
Hemoglobin: 9.2 g/dL — ABNORMAL LOW (ref 12.0–15.0)
Lymphocytes Relative: 65.1 % — ABNORMAL HIGH (ref 12.0–46.0)
Lymphs Abs: 3.8 10*3/uL (ref 0.7–4.0)
MCHC: 32.5 g/dL (ref 30.0–36.0)
MCV: 82.6 fl (ref 78.0–100.0)
Monocytes Absolute: 0.1 10*3/uL (ref 0.1–1.0)
Monocytes Relative: 2.3 % — ABNORMAL LOW (ref 3.0–12.0)
Neutro Abs: 1.8 10*3/uL (ref 1.4–7.7)
Neutrophils Relative %: 31.2 % — ABNORMAL LOW (ref 43.0–77.0)
Platelets: 380 10*3/uL (ref 150.0–400.0)
RBC: 3.43 Mil/uL — ABNORMAL LOW (ref 3.87–5.11)
RDW: 27.3 % — ABNORMAL HIGH (ref 11.5–15.5)
WBC: 5.9 10*3/uL (ref 4.0–10.5)

## 2018-12-01 MED ORDER — KETOROLAC TROMETHAMINE 60 MG/2ML IM SOLN
60.0000 mg | Freq: Once | INTRAMUSCULAR | Status: AC
  Administered 2018-12-01: 60 mg via INTRAMUSCULAR

## 2018-12-01 MED ORDER — HYDROCODONE-ACETAMINOPHEN 5-325 MG PO TABS: 1.0000 | ORAL_TABLET | Freq: Three times a day (TID) | ORAL | 0 refills | Status: DC | PRN

## 2018-12-01 MED ORDER — KETOROLAC TROMETHAMINE 60 MG/2ML IM SOLN: 60.0000 mg | Freq: Once | INTRAMUSCULAR | Status: AC

## 2018-12-01 NOTE — Telephone Encounter (Addendum)
Rx was sent in 11/29/17 during [place] to send to different pharmacy?  Rx printed by mistake, will shred and pend order.

## 2018-12-01 NOTE — Telephone Encounter (Signed)
Duplicate message. 

## 2018-12-01 NOTE — Telephone Encounter (Signed)
Copied from [place] [id number]. Topic: Quick Communication - Rx Refill/Question >> Dec 01, 2018  2:15 PM [place], [place] D wrote: Medication: HYDROcodone-acetaminophen (NORCO) 5-325 MG tablet / Pt stated that her pharmacy cannot fill this rx because it is a controlled substance. They told her to contact the doctor's office to have it sent to a different pharmacy. Pt requesting rx be sent to [facility] on [address] and [address]. Please advise  Has the patient contacted their pharmacy? Yes.   (Agent: If no, request that the patient contact the pharmacy for the refill.) (Agent: If yes, when and what did the pharmacy advise?)  Preferred Pharmacy (with phone number or street name): [facility], [place] [address] [place] [phone] (Phone) [phone] (Fax)  Agent: Please be advised that RX refills may take up to 3 business days. We ask that you follow-up with your pharmacy.

## 2018-12-01 NOTE — Progress Notes (Signed)
[name] DOB: [birth year] Encounter date: 12/01/2018  This is a 33 y.o. female who presents with Chief Complaint  Patient presents with  . Emesis    bloating, pelvic pain, fibroid found, bleeding since may    History of present illness:  [name] is here with her partner today and looking for some relief for symptoms that have been bothering her for months. She has had multiple ER visits for similar symptoms, but hasn't been able to get anywhere in terms of resolution/treatment of symptoms.  Period has been non-stop since end of May. Has been going back and forth to OBGYN. Has seen different providers.  Had [place] and pelvic exam 18 months ago. Told she had fibroids but they didn't think that fibroids were causing pain. Had another [place] and was told that uterus is "one big fibroid". Last obgyn told her she would need surgery, but this was never set up for her and she ended up scheduling with obgyn through [facility] since they take her insurance. Pain is still there and this is worst that pain has been, although she has had it to this severity even during ER visits in last few months and states that she was told that CT scan was normal in this time (I cannot see this but do see pelvic US showing fibroid). Feels like she can't even take deep breath. Breathes are stacked on top of each other. Feels bloated. Discomfort comes and goes but is usually there to some degree.   Pain level right now is over 10/10. Has tried naproxen, ibuprofen, robaxin, sitting in warm water, tried compress without relief. She called [facility] this morning due to pain and they moved up her appointment and she will see them tomorrow morning at 9am.  Wearing pads and diapers and bleeding through all. Passing clots as well - larger than golf ball. Nothing is relieving presure.   She has not been sexually active for over a year.   Feels like she is sitting on ball; pressure, feels like she is urinating on herself.   Called to  [facility] and was able to make appointment for tomorrow morning obgyn with [facility]. They have not yet seen her records.   Birth control didn't help with bleeding. She did not want to have IUD which was another suggestion in the past. Has maybe had a day or two without bleeding, but most days just heavy and with clots.   Very tired. Did get iron infusion x 2 (one last Tuesday and one this week). Following w hematology.   Allergies  Allergen Reactions  . Aspirin Hives   Current Meds  Medication Sig  . diclofenac sodium (VOLTAREN) 1 % GEL Apply 2 g topically 4 (four) times daily.  Marland Kitchen gabapentin (NEURONTIN) 300 MG capsule Take 1 cap in AM, 2 caps in PM  . lidocaine (LIDODERM) 5 % Place 1 patch onto the skin daily. Remove & Discard patch within 12 hours or as directed by MD  . megestrol (MEGACE) 40 MG tablet Take 1 tablet (40 mg total) by mouth 2 (two) times daily.  . meloxicam (MOBIC) 7.5 MG tablet Take 1 tablet (7.5 mg total) by mouth daily.  . metoprolol succinate (TOPROL-XL) 100 MG 24 hr tablet Take 1 tablet (100 mg total) by mouth daily.  . naproxen (EC NAPROSYN) 500 MG EC tablet Take 1 tablet (500 mg total) by mouth 3 (three) times daily with meals.  . Norethindrone-Mestranol (NECON) 1-50 MG-MCG tablet Take 1 tablet by mouth  daily. Do not take placebo pill until further instruction. One twice a day until bleeding stops  . norgestimate-ethinyl estradiol (ORTHO-CYCLEN,SPRINTEC,PREVIFEM) 0.25-35 MG-MCG tablet Take 1 tablet by mouth daily. 2 pills a day until no bleeding then 1 daily.  . ondansetron (ZOFRAN) 4 MG tablet Take 1 tablet (4 mg total) by mouth every 6 (six) hours.  . Prenatal Vit-Fe Fumarate-FA (PRENATAL VITAMIN PO) Take 1 tablet by mouth daily.  . sertraline (ZOLOFT) 50 MG tablet Take 1 tablet (50 mg total) by mouth daily.   Current Facility-Administered Medications for the 12/01/18 encounter (Office Visit) with Caren Macadam, MD  Medication  . leuprolide (LUPRON) injection 11.25 mg     Review of Systems  Constitutional: Positive for fatigue. Negative for chills and fever.  Gastrointestinal: Positive for abdominal distention, abdominal pain, constipation (has difficulty with BM due to pressure. Thinks last was within week. Does miss multiple days between stools.), nausea and vomiting (going on for years). Negative for diarrhea. Anal bleeding: uncertain due to significant vaginal bleeding.  Genitourinary: Positive for frequency. Negative for difficulty urinating.  Neurological: Positive for dizziness and light-headedness (ongoing for years).    Objective:  BP (!) 120/58 (BP Location: Left Arm, Patient Position: Sitting, Cuff Size: Normal)   Pulse 80   Temp 98.5 F (36.9 C) (Oral)   Wt 157 lb 14.4 oz (71.6 kg)   BMI 22.66 kg/m   Weight: 157 lb 14.4 oz (71.6 kg)   BP Readings from Last 3 Encounters:  12/01/18 (!) 120/58  11/30/18 112/70  11/23/18 (!) 118/53   Wt Readings from Last 3 Encounters:  12/01/18 157 lb 14.4 oz (71.6 kg)  11/22/18 158 lb 3.2 oz (71.8 kg)  11/08/18 154 lb (69.9 kg)    Physical Exam Constitutional:      General: She is not in acute distress (intermittently she appears very uncomfortabl in seated position and has to stand/shift but other moments is very calm and appears comfortable and in no distress.).    Appearance: She is well-developed.  Cardiovascular:     Rate and Rhythm: Normal rate and regular rhythm.     Heart sounds: Normal heart sounds. No murmur. No friction rub.     Comments: No lower extremity edema Pulmonary:     Effort: Pulmonary effort is normal. No respiratory distress.     Breath sounds: Normal breath sounds. No wheezing or rales.  Abdominal:     General: Bowel sounds are normal.     Tenderness: There is generalized abdominal tenderness. There is no guarding or rebound. Negative signs include Murphy's sign and McBurney's sign.     Comments: With palpation she is diffusely tender although I cannot appreciated  guarding. She is more tender in suprapubic and lower abdomen.   Neurological:     Mental Status: She is alert and oriented to person, place, and time.  Psychiatric:        Behavior: Behavior normal.     Assessment/Plan 1. Blood loss anemia Following w hematology. Has appt with obgyn tomorrow and I suspect they will further evaluate fibroid and discuss treatment plan. Suspect anemia from heavy menses, but will follow along with obgyn and consider further work up if needed. - CBC with Differential/Platelet; Future - CBC with Differential/Platelet  2. Generalized abdominal pain She is uncomfortable in office today. I cannot see CT she states was done recently, but her pain is fluctuating and work up thus far has shown fibroids without other abnormality per patient.  She additionally has  chronic symptoms of nausea, vomiting, dizziness, and has had ongoing abdominal pain which makes acuity of symptoms more difficult to discern. We did discuss constipation and I encouraged her to drink water and consider miralax to help with this since constipation will only add to her discomfort. I advised not using laxatives at this time because I worry they will cause more cramping/discomfort. We also discussed that pain medication will contribute and I advised her to limit use, but did give her hydrocodone which she has had in past for pain. We gave toradol in office in effort to help curb discomfort. I have recommended that if ANY worsening she go to ER tonight. She agrees to do so.   CBC ordered to make sure anemia is stable and to make sure no WBC elevation.   - CBC with Differential/Platelet; Future - ketorolac (TORADOL) injection 60 mg - CBC with Differential/Platelet - ketorolac (TORADOL) injection 60 mg    Return pending bloodwork; ER if worsening of symptoms.     Micheline Rough, MD

## 2018-12-01 NOTE — Telephone Encounter (Signed)
Copied from [place] [id number]. Topic: Quick Communication - See Telephone Encounter >> Dec 01, 2018  1:28 PM [place] Chancy, [place] wrote: CRM for notification. See Telephone encounter for: 12/01/18.  [facility] is calling and states they do not fill HYDROcodone-acetaminophen (NORCO) 5-325 MG tablet  at that pharmacy and they have deleted it.  [place] [place] [address] Phone: [phone] Fax: [phone]

## 2018-12-01 NOTE — Addendum Note (Signed)
Addended by: Rebecca Eaton on: 12/01/2018 03:53 PM   Modules accepted: Orders

## 2018-12-01 NOTE — Addendum Note (Signed)
Addended by: Caren Macadam on: 12/01/2018 04:12 PM   Modules accepted: Orders

## 2018-12-02 LAB — PATHOLOGIST SMEAR REVIEW

## 2018-12-02 NOTE — Telephone Encounter (Addendum)
Patient's HYDROcodone-acetaminophen (NORCO) 5-325 MG tablet medication prescription was printed but it should have been sent to her preferred pharmacy [facility] on [address].

## 2018-12-03 ENCOUNTER — Encounter: Payer: Self-pay | Admitting: Family Medicine

## 2018-12-07 ENCOUNTER — Encounter (INDEPENDENT_AMBULATORY_CARE_PROVIDER_SITE_OTHER): Payer: Self-pay | Admitting: Orthopaedic Surgery

## 2018-12-07 ENCOUNTER — Ambulatory Visit (INDEPENDENT_AMBULATORY_CARE_PROVIDER_SITE_OTHER): Payer: Self-pay

## 2018-12-07 ENCOUNTER — Ambulatory Visit: Payer: Self-pay | Admitting: Physical Therapy

## 2018-12-07 ENCOUNTER — Ambulatory Visit (INDEPENDENT_AMBULATORY_CARE_PROVIDER_SITE_OTHER): Payer: Self-pay | Admitting: Orthopaedic Surgery

## 2018-12-07 DIAGNOSIS — M542 Cervicalgia: Secondary | ICD-10-CM

## 2018-12-07 DIAGNOSIS — M545 Low back pain: Secondary | ICD-10-CM

## 2018-12-07 DIAGNOSIS — G8929 Other chronic pain: Secondary | ICD-10-CM
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
## 2018-12-15 ENCOUNTER — Encounter: Payer: Self-pay | Admitting: Family Medicine

## 2018-12-15 ENCOUNTER — Other Ambulatory Visit: Payer: Self-pay

## 2018-12-15 ENCOUNTER — Encounter: Payer: Self-pay | Admitting: Physical Therapy

## 2018-12-15 ENCOUNTER — Ambulatory Visit: Payer: Self-pay | Admitting: Physical Therapy

## 2018-12-15 DIAGNOSIS — R252 Cramp and spasm: Secondary | ICD-10-CM | POA: Insufficient documentation

## 2018-12-15 DIAGNOSIS — M542 Cervicalgia: Secondary | ICD-10-CM | POA: Insufficient documentation

## 2018-12-15 NOTE — Therapy (Signed)
[facility] [place], [place], [address] Phone: [phone]   Fax:  [phone]  Physical Therapy Evaluation  Patient Details  Name: [name] MRN: [id number] Date of Birth: Sep 16, 1986 Referring Provider (PT): Alysia Penna   Encounter Date: 12/15/2018    Past Medical History:  Diagnosis Date  . Allergic rhinitis 01/24/2015  . Arrhythmia Dx 2015  . Back pain   . Breast mass   . Common migraine 04/15/2017  . Depression Dx 2000  . Fibromyalgia   . GERD (gastroesophageal reflux disease) 03/13/2015  . Headache    migraines  . Heart murmur Dx 2015  . 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 [place] Dx [address]  . Lumbar radicular pain 11/22/2014  . Pain disorder 08/29/2017  . Panic attack Dx 2006  . Positive depression screening 08/29/2017  . PTSD (post-traumatic stress disorder) Dx 2013  . Seizures ([place]) 2001  . Vitamin D deficiency 01/24/2015    Past Surgical History:  Procedure Laterality Date  . BREAST BIOPSY Left   . ESOPHAGOGASTRODUODENOSCOPY N/A 08/14/2015   Procedure: ESOPHAGOGASTRODUODENOSCOPY (EGD);  Surgeon: Carol Ada, MD;  Location: [facility];  Service: Endoscopy;  Laterality: N/A;    There were no vitals filed for this visit.   Subjective Assessment - 12/15/18 1058    Subjective  Patient was seen here previously last year, she had 6 no shows and 12 cancels, reporting pain and many other reasons.  She reports to me that she has an MRI ordered but will be in the next few weeks, she also report sthat she will be having surgery on her abdomen for uterine fibroids next week.  So we have decided to not see her as she has additional things coming up and would not be able to be seen for waht she thinks will be 6 weeks after the surgery.                    Objective measurements completed on examination: See above findings.                PT  Short Term Goals - 09/24/18 0949      PT SHORT TERM GOAL #1   Title  I with initial HEP    Status  Achieved      PT SHORT TERM GOAL #2   Title  Patient to report pain decrease by 25% overall    Status  On-going      PT SHORT TERM GOAL #3   Title  Patient to demo improved postural alignement (normal head position, no left lumbar SB, no post pelvic tilt).    Status  On-going        PT Long Term Goals - 09/24/18 0950      PT LONG TERM GOAL #1   Title  Patient to report decreased pain overall by 60% or more with ADLS.    Status  On-going      PT LONG TERM GOAL #2   Title  Patient to demo improved cervical rotation to [facility] to normalize ADLs.    Status  On-going      PT LONG TERM GOAL #3   Status  Achieved      PT LONG TERM GOAL #4   Title  Patient to report improved function overall by 50% or more.    Status  On-going  Plan - 12/15/18 1112    Clinical Impression Statement  Patient has a new MRI scheduled and a surgery next week we have elected to not do the evaluation today since she will not be able to return for about 6 weeks    PT Next Visit Plan  hold on the evaluation until after MRI and surgery       Patient will benefit from skilled therapeutic intervention in order to improve the following deficits and impairments:     Visit Diagnosis: Cervicalgia  Cramp and spasm     Problem List Patient Active Problem List   Diagnosis Date Noted  . Iron deficiency anemia due to chronic blood loss 11/22/2018  . Pelvic pain in female 11/08/2018  . Hypertension 08/30/2018  . DUB (dysfunctional uterine bleeding) 06/17/2018  . Fibroid uterus 01/18/2018  . Cervical radiculopathy 01/01/2018  . Pain disorder 08/29/2017  . Neck pain without injury 08/24/2017  . Chronic fatigue 05/15/2017  . Common migraine 04/15/2017  . Axillary mass, right 04/09/2017  . History of bulimia nervosa 03/13/2015  . GERD (gastroesophageal reflux disease) 03/13/2015  . SOB  (shortness of breath) 03/13/2015  . H/O vitamin D deficiency 03/13/2015  . Health care maintenance 01/25/2015  . Chronic tension type headache 01/25/2015  . Muscle pain, fibromyalgia 01/24/2015  . Allergic rhinitis 01/24/2015  . Lumbar radicular pain 11/22/2014  . Thoracic neuralgia 11/22/2014  . Back pain   . Abdominal pain 06/06/2012  . Chronic pain 06/06/2012  . Depression 06/06/2012  . HEMOPTYSIS UNSPECIFIED 12/14/2009  . BREAST TENDERNESS 11/08/2009  . NIPPLE DISCHARGE 11/08/2009  . MENORRHAGIA 11/08/2009  . DISORDER, BIPOLAR NOS 08/06/2007  . PERSONALITY DISORDER 08/06/2007  . MARIJUANA ABUSE 08/06/2007  . NARCOTIC ABUSE 08/06/2007  . SEIZURE DISORDER 08/06/2007  . SOMATIZATION DISORDER 12/27/2005    Sumner Boast., PT 12/15/2018, 11:14 AM  [facility] [place], [place], [address] Phone: [phone]   Fax:  [phone]  Name: [name] MRN: [id number] Date of Birth: 13-Apr-1986

## 2018-12-24 ENCOUNTER — Telehealth: Payer: Self-pay | Admitting: Family Medicine

## 2018-12-24 NOTE — Telephone Encounter (Signed)
Patient wishes to make appointment to discuss lab values.  Telephone call dropped .  Return call to Patient  Left message for return call to schedule appointment.

## 2018-12-24 NOTE — Telephone Encounter (Signed)
This encounter was created in error - please disregard.

## 2018-12-27 ENCOUNTER — Telehealth: Payer: Self-pay

## 2018-12-27 ENCOUNTER — Encounter: Payer: Self-pay | Admitting: Family Medicine

## 2018-12-27 NOTE — Telephone Encounter (Signed)
Copied from [place] [id number]. Topic: General - Other >> Dec 27, 2018  8:53 AM Oneta Rack wrote: Relation to pt: self  Call back number: [phone]    Reason for call:  Patient wanted to inform PCP regarding her fibroid surgery, patient would like to speak with PCP directly, please advise

## 2018-12-27 NOTE — Telephone Encounter (Signed)
Spoke with patient. She stated she just wanted to touch base with Dr. Ethlyn Gallery after her surgery. Patient states that she saw OBGYN and they did an MRI on 12/17/2018. She returned to their office on 12/20/2018 ti discuss what had been found on the MRI. Patient was informed by OBGYN that she had a fibroid "the size of a baby's head" pressing directly on her bladder. Patient went in for surgery and during surgeon found that due to the size of the fibroid there was a hole in both, her bladder and uterus. Surgery was done through the abdomin to repair the bladder and uterus.

## 2018-12-29 ENCOUNTER — Other Ambulatory Visit: Payer: Self-pay

## 2018-12-29 DIAGNOSIS — D500 Iron deficiency anemia secondary to blood loss (chronic): Secondary | ICD-10-CM

## 2018-12-29 NOTE — Telephone Encounter (Signed)
Noted  

## 2018-12-30 ENCOUNTER — Inpatient Hospital Stay: Payer: Self-pay | Attending: Hematology and Oncology

## 2018-12-30 DIAGNOSIS — N939 Abnormal uterine and vaginal bleeding, unspecified: Secondary | ICD-10-CM | POA: Insufficient documentation

## 2018-12-30 DIAGNOSIS — D500 Iron deficiency anemia secondary to blood loss (chronic): Secondary | ICD-10-CM | POA: Insufficient documentation

## 2018-12-30 DIAGNOSIS — Z79899 Other long term (current) drug therapy: Secondary | ICD-10-CM | POA: Insufficient documentation

## 2018-12-30 LAB — CBC WITH DIFFERENTIAL (CANCER CENTER ONLY)
Abs Immature Granulocytes: 0.01 10*3/uL (ref 0.00–0.07)
Basophils Absolute: 0 10*3/uL (ref 0.0–0.1)
Basophils Relative: 0 %
Eosinophils Absolute: 0.1 10*3/uL (ref 0.0–0.5)
Eosinophils Relative: 1 %
HCT: 26.1 % — ABNORMAL LOW (ref 36.0–46.0)
Hemoglobin: 8.8 g/dL — ABNORMAL LOW (ref 12.0–15.0)
Immature Granulocytes: 0 %
Lymphocytes Relative: 19 %
Lymphs Abs: 1.5 10*3/uL (ref 0.7–4.0)
MCH: 30.8 pg (ref 26.0–34.0)
MCHC: 33.7 g/dL (ref 30.0–36.0)
MCV: 91.3 fL (ref 80.0–100.0)
Monocytes Absolute: 0.4 10*3/uL (ref 0.1–1.0)
Monocytes Relative: 5 %
Neutro Abs: 5.7 10*3/uL (ref 1.7–7.7)
Neutrophils Relative %: 75 %
Platelet Count: 333 10*3/uL (ref 150–400)
RBC: 2.86 MIL/uL — ABNORMAL LOW (ref 3.87–5.11)
RDW: 14.6 % (ref 11.5–15.5)
WBC Count: 7.7 10*3/uL (ref 4.0–10.5)
nRBC: 0 % (ref 0.0–0.2)

## 2018-12-30 LAB — IRON AND TIBC
Iron: 40 ug/dL — ABNORMAL LOW (ref 41–142)
Saturation Ratios: 13 % — ABNORMAL LOW (ref 21–57)
TIBC: 296 ug/dL (ref 236–444)
UIBC: 256 ug/dL (ref 120–384)

## 2018-12-30 LAB — FERRITIN: Ferritin: 54 ng/mL (ref 11–307)

## 2019-01-03 ENCOUNTER — Inpatient Hospital Stay: Payer: Self-pay | Admitting: Hematology and Oncology

## 2019-01-03 NOTE — Assessment & Plan Note (Signed)
Profound iron deficiency anemia due to chronic blood loss from the uterus since May 2019 Uterine ultrasound revealed fibroid Patient wants to have children and wants minimal invasive surgery. IV iron infusion: 11/23/2018 and 11/30/2018  Lab review 12/30/2018: Hemoglobin 8.8 (previously it was 9.2), MCV 91.3, ferritin 54, iron saturation 13%, TIBC 296 Patient is now normocytic. In spite of IV iron treatment and replenishment of her iron studies patient continues to have severe anemia There are no kidney or liver function abnormalities.  Recommendation: Bone marrow biopsy evaluation.

## 2019-01-04 ENCOUNTER — Telehealth: Payer: Self-pay | Admitting: Internal Medicine

## 2019-01-04 NOTE — Progress Notes (Signed)
Patient Care Team: Caren Macadam, MD as PCP - General (Family Medicine)  DIAGNOSIS:    ICD-10-CM   1. Iron deficiency anemia due to chronic blood loss D50.0 CBC with Differential (Cancer Center Only)    Ferritin    Iron and TIBC    CHIEF COMPLIANT: Severe iron deficiency anemia  INTERVAL HISTORY: [name] is a 33 y.o. with above-mentioned history of severe iron deficiency anemia. She presents to the clinic today with a family member. She had surgery on 12/21/18 to remove a fibroid tumor that was the size of a baby's head and had damaged her bladder and uterus. She had five blood transfusions during surgery and is experiencing incredible pain as she recovers. Pain in her rectal area and pressure in her uterus have resolved following surgery. She reports swollen ankles and legs despite moving around consistently. She is not eating well or regularly because she had lack of appetite after her surgery. Her most recent labs from 12/30/18 show: WBC 7.7, Hg 8.8, ferritin 54, iron saturation 13%.   REVIEW OF SYSTEMS:   Constitutional: Denies fevers, chills or abnormal weight loss Eyes: Denies blurriness of vision Ears, nose, mouth, throat, and face: Denies mucositis or sore throat Respiratory: Denies cough, dyspnea or wheezes Cardiovascular: Denies palpitation, chest discomfort Gastrointestinal: Denies nausea, heartburn or change in bowel habits (+) severe abd pain Skin: Denies abnormal skin rashes Lymphatics: Denies new lymphadenopathy or easy bruising Neurological: Denies numbness, tingling or new weaknesses Behavioral/Psych: Mood is stable, no new changes  Extremities: (+) edema in ankles and legs  Breast: denies any pain or lumps or nodules in either breasts All other systems were reviewed with the patient and are negative.  I have reviewed the past medical history, past surgical history, social history and family history with the patient and they are unchanged from previous  note.  ALLERGIES:  is allergic to aspirin.  MEDICATIONS:  Current Outpatient Medications  Medication Sig Dispense Refill  . diclofenac sodium (VOLTAREN) 1 % GEL Apply 2 g topically 4 (four) times daily. 100 g 1  . gabapentin (NEURONTIN) 300 MG capsule Take 1 cap in AM, 2 caps in PM 90 capsule 6  . HYDROcodone-acetaminophen (NORCO) 5-325 MG tablet Take 1-2 tablets by mouth 3 (three) times daily as needed for moderate pain or severe pain. (Patient not taking: Reported on 12/07/2018) 10 tablet 0  . lidocaine (LIDODERM) 5 % Place 1 patch onto the skin daily. Remove & Discard patch within 12 hours or as directed by MD 30 patch 0  . megestrol (MEGACE) 40 MG tablet Take 1 tablet (40 mg total) by mouth 2 (two) times daily. 30 tablet 3  . meloxicam (MOBIC) 7.5 MG tablet Take 1 tablet (7.5 mg total) by mouth daily. 30 tablet 0  . metoprolol succinate (TOPROL-XL) 100 MG 24 hr tablet Take 1 tablet (100 mg total) by mouth daily. 30 tablet 3  . naproxen (EC NAPROSYN) 500 MG EC tablet Take 1 tablet (500 mg total) by mouth 3 (three) times daily with meals. (Patient not taking: Reported on 12/07/2018) 60 tablet 1  . Norethindrone-Mestranol (NECON) 1-50 MG-MCG tablet Take 1 tablet by mouth daily. Do not take placebo pill until further instruction. One twice a day until bleeding stops (Patient not taking: Reported on 12/07/2018) 1 Package 11  . norgestimate-ethinyl estradiol (ORTHO-CYCLEN,SPRINTEC,PREVIFEM) 0.25-35 MG-MCG tablet Take 1 tablet by mouth daily. 2 pills a day until no bleeding then 1 daily. 2 Package 5  . ondansetron (ZOFRAN)  4 MG tablet Take 1 tablet (4 mg total) by mouth every 6 (six) hours. 12 tablet 0  . Prenatal Vit-Fe Fumarate-FA (PRENATAL VITAMIN PO) Take 1 tablet by mouth daily.    . sertraline (ZOLOFT) 50 MG tablet Take 1 tablet (50 mg total) by mouth daily. 30 tablet 1   Current Facility-Administered Medications  Medication Dose Route Frequency Provider Last Rate Last Dose  . leuprolide  (LUPRON) injection 11.25 mg  11.25 mg Intramuscular Once Woodroe Mode, MD        PHYSICAL EXAMINATION: ECOG PERFORMANCE STATUS: 1 - Symptomatic but completely ambulatory  Vitals:   01/05/19 1320  BP: (!) 112/43  Pulse: 95  Resp: 18  Temp: 97.6 F (36.4 C)  SpO2: 100%   Filed Weights   01/05/19 1320  Weight: 160 lb 11.2 oz (72.9 kg)    GENERAL: alert, no distress and comfortable SKIN: skin color, texture, turgor are normal, no rashes or significant lesions EYES: normal, Conjunctiva are pink and non-injected, sclera clear OROPHARYNX: no exudate, no erythema and lips, buccal mucosa, and tongue normal  NECK: supple, thyroid normal size, non-tender, without nodularity LYMPH: no palpable lymphadenopathy in the cervical, axillary or inguinal LUNGS: clear to auscultation and percussion with normal breathing effort HEART: regular rate & rhythm and no murmurs and no lower extremity edema ABDOMEN: abdomen soft, non-tender and normal bowel sounds MUSCULOSKELETAL: no cyanosis of digits and no clubbing  NEURO: alert & oriented x 3 with fluent speech, no focal motor/sensory deficits EXTREMITIES: No lower extremity edema  LABORATORY DATA:  I have reviewed the data as listed CMP Latest Ref Rng & Units 10/11/2018 09/08/2018 08/03/2018  Glucose 70 - 99 mg/dL 80 97 86  BUN 6 - 23 mg/dL 11 10 12   Creatinine 0.40 - 1.20 mg/dL 0.65 0.66 0.64  Sodium 135 - 145 mEq/L 141 142 139  Potassium 3.5 - 5.1 mEq/L 3.9 3.0(L) 3.9  Chloride 96 - 112 mEq/L 105 105 106  CO2 19 - 32 mEq/L 29 28 29   Calcium 8.4 - 10.5 mg/dL 9.0 9.4 9.1  Total Protein 6.0 - 8.3 g/dL 6.3 8.0 7.0  Total Bilirubin 0.2 - 1.2 mg/dL 0.6 0.7 0.4  Alkaline Phos 39 - 117 U/L 63 70 68  AST 0 - 37 U/L 18 22 16   ALT 0 - 35 U/L 10 14 13     Lab Results  Component Value Date   WBC 7.7 12/30/2018   HGB 8.8 (L) 12/30/2018   HCT 26.1 (L) 12/30/2018   MCV 91.3 12/30/2018   PLT 333 12/30/2018   NEUTROABS 5.7 12/30/2018     ASSESSMENT & PLAN:  Iron deficiency anemia due to chronic blood loss Profound iron deficiency anemia due to chronic blood loss from the uterus since May 2019 Uterine ultrasound revealed fibroid Patient wants to have children and wants minimal invasive surgery. IV iron infusion: 11/23/2018 and 11/30/2018  Lab review 12/30/2018: Hemoglobin 8.8 (previously it was 9.2), MCV 91.3, ferritin 54, iron saturation 13%, TIBC 296 Patient is now normocytic.  Patient had a hysterectomy with a stent placement in the ureters and she currently has a UTI and a catheter in place. During surgery she apparently lost significant amount of blood and required findings of blood transfusion. I suspect this is the reason for her current normocytic anemia with normal ferritin levels.  I anticipate her blood counts to improve spontaneously over the next couple of months. We will see her back in 3 months with labs and follow-up.  If  her hemoglobin improves along with her iron studies and we would not need to see her again in the future since the uterus has been removed.    Orders Placed This Encounter  Procedures  . CBC with Differential (Cancer Center Only)    Standing Status:   Future    Standing Expiration Date:   01/06/2020  . Ferritin    Standing Status:   Future    Standing Expiration Date:   01/05/2020  . Iron and TIBC    Standing Status:   Future    Standing Expiration Date:   01/05/2020   The patient has a good understanding of the overall plan. she agrees with it. she will call with any problems that may develop before the next visit here.  Nicholas Lose, MD 01/05/2019  Julious Oka Dorshimer am acting as scribe for Dr. Nicholas Lose.  I have reviewed the above documentation for accuracy and completeness, and I agree with the above.

## 2019-01-04 NOTE — Telephone Encounter (Signed)
New message:    Patient calling back concerning her primary doctor wanted her to report.

## 2019-01-05 ENCOUNTER — Inpatient Hospital Stay (HOSPITAL_BASED_OUTPATIENT_CLINIC_OR_DEPARTMENT_OTHER): Payer: Self-pay | Admitting: Hematology and Oncology

## 2019-01-05 ENCOUNTER — Telehealth: Payer: Self-pay | Admitting: Hematology and Oncology

## 2019-01-05 DIAGNOSIS — D500 Iron deficiency anemia secondary to blood loss (chronic): Secondary | ICD-10-CM

## 2019-01-05 DIAGNOSIS — N939 Abnormal uterine and vaginal bleeding, unspecified: Secondary | ICD-10-CM

## 2019-01-05 NOTE — Assessment & Plan Note (Signed)
Profound iron deficiency anemia due to chronic blood loss from the uterus since May 2019 Uterine ultrasound revealed fibroid Patient wants to have children and wants minimal invasive surgery. IV iron infusion: 11/23/2018 and 11/30/2018  Lab review 12/30/2018: Hemoglobin 8.8 (previously it was 9.2), MCV 91.3, ferritin 54, iron saturation 13%, TIBC 296 Patient is now normocytic. In spite of IV iron treatment and replenishment of her iron studies patient continues to have severe anemia There are no kidney or liver function abnormalities.  Recommendation: Bone marrow biopsy evaluation.

## 2019-01-05 NOTE — Telephone Encounter (Signed)
Gave avs and calendar ° °

## 2019-01-05 NOTE — Telephone Encounter (Signed)
LVM for return call. 

## 2019-03-11 ENCOUNTER — Other Ambulatory Visit: Payer: Self-pay

## 2019-03-11 ENCOUNTER — Ambulatory Visit: Payer: Self-pay | Admitting: Family Medicine

## 2019-03-11 NOTE — Progress Notes (Unsigned)
Virtual Visit via Video Note  I connected with@ on 03/11/19 at  9:30 AM EDT by a video enabled telemedicine application and verified that I am speaking with the correct person using two identifiers.  Location patient: home Location provider:work or home office Persons participating in the virtual visit: patient, provider  I discussed the limitations of evaluation and management by telemedicine and the availability of in person appointments. The patient expressed understanding and agreed to proceed.   [name] DOB: [birth year] Encounter date: 03/11/2019  This is a 33 y.o. female who presents with No chief complaint on file.   History of present illness:  HPI   Allergies  Allergen Reactions  . Aspirin Hives   No outpatient medications have been marked as taking for the 03/11/19 encounter (Appointment) with Caren Macadam, MD.   Current Facility-Administered Medications for the 03/11/19 encounter (Appointment) with Caren Macadam, MD  Medication  . leuprolide (LUPRON) injection 11.25 mg    Review of Systems  Objective:  There were no vitals taken for this visit.      BP Readings from Last 3 Encounters:  01/05/19 (!) 112/43  12/07/18 117/85  12/01/18 (!) 120/58   Wt Readings from Last 3 Encounters:  01/05/19 160 lb 11.2 oz (72.9 kg)  12/07/18 156 lb (70.8 kg)  12/01/18 157 lb 14.4 oz (71.6 kg)    EXAM:  GENERAL: alert, oriented, appears well and in no acute distress  HEENT: atraumatic, conjunctiva clear, no obvious abnormalities on inspection of external nose and ears  NECK: normal movements of the head and neck  LUNGS: on inspection no signs of respiratory distress, breathing rate appears normal, no obvious gross SOB, gasping or wheezing  CV: no obvious cyanosis  MS: moves all visible extremities without noticeable abnormality  PSYCH/NEURO: pleasant and cooperative, no obvious depression or anxiety, speech and thought processing grossly  intact ***  Assessment/Plan  There are no diagnoses linked to this encounter.     I discussed the assessment and treatment plan with the patient. The patient was provided an opportunity to ask questions and all were answered. The patient agreed with the plan and demonstrated an understanding of the instructions.   The patient was advised to call back or seek an in-person evaluation if the symptoms worsen or if the condition fails to improve as anticipated.  I provided *** minutes of non-face-to-face time during this encounter.   Micheline Rough, MD

## 2019-04-01 ENCOUNTER — Ambulatory Visit (INDEPENDENT_AMBULATORY_CARE_PROVIDER_SITE_OTHER): Payer: Self-pay

## 2019-04-01 ENCOUNTER — Encounter (HOSPITAL_COMMUNITY): Payer: Self-pay | Admitting: Emergency Medicine

## 2019-04-01 ENCOUNTER — Ambulatory Visit (HOSPITAL_COMMUNITY)
Admission: EM | Admit: 2019-04-01 | Discharge: 2019-04-01 | Disposition: A | Discharge status: Home / Self Care | Payer: Self-pay | Attending: Emergency Medicine | Admitting: Emergency Medicine

## 2019-04-01 ENCOUNTER — Other Ambulatory Visit: Payer: Self-pay

## 2019-04-01 DIAGNOSIS — S60212A Contusion of left wrist, initial encounter: Secondary | ICD-10-CM
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
## 2019-04-01 NOTE — Discharge Instructions (Addendum)
Dg Wrist Complete Left  Result Date: 04/01/2019 CLINICAL DATA:  Pain status post injury EXAM: LEFT WRIST - COMPLETE 3+ VIEW COMPARISON:  None. FINDINGS: There is no evidence of fracture or dislocation. There is no evidence of arthropathy or other focal bone abnormality. Soft tissues are unremarkable. IMPRESSION: Negative. Electronically Signed   By: Constance Holster M.D.   On: 04/01/2019 13:25    Xray is normal today, no fracture.  Obviously bruised.  Continue with the provided medrol as this will help with swelling.  May take additional tylenol as needed as well.  Ice and elevation to help with pain and swelling.  May use ACE wrap for comfort as needed.  Follow up with your primary care provider and/or orthopedics for any persistent or worsening of symptoms.

## 2019-04-04 ENCOUNTER — Telehealth: Payer: Self-pay | Admitting: Hematology and Oncology

## 2019-04-04 ENCOUNTER — Inpatient Hospital Stay: Payer: Self-pay | Attending: Hematology and Oncology

## 2019-04-04 ENCOUNTER — Other Ambulatory Visit: Payer: Self-pay

## 2019-04-04 DIAGNOSIS — N9961 Intraoperative hemorrhage and hematoma of a genitourinary system organ or structure complicating a genitourinary system procedure: Secondary | ICD-10-CM | POA: Insufficient documentation

## 2019-04-04 DIAGNOSIS — D500 Iron deficiency anemia secondary to blood loss (chronic): Secondary | ICD-10-CM | POA: Insufficient documentation

## 2019-04-04 LAB — CBC WITH DIFFERENTIAL (CANCER CENTER ONLY)
Abs Immature Granulocytes: 0.01 10*3/uL (ref 0.00–0.07)
Basophils Absolute: 0 10*3/uL (ref 0.0–0.1)
Basophils Relative: 0 %
Eosinophils Absolute: 0 10*3/uL (ref 0.0–0.5)
Eosinophils Relative: 1 %
HCT: 32.4 % — ABNORMAL LOW (ref 36.0–46.0)
Hemoglobin: 10.9 g/dL — ABNORMAL LOW (ref 12.0–15.0)
Immature Granulocytes: 0 %
Lymphocytes Relative: 40 %
Lymphs Abs: 2 10*3/uL (ref 0.7–4.0)
MCH: 28.8 pg (ref 26.0–34.0)
MCHC: 33.6 g/dL (ref 30.0–36.0)
MCV: 85.7 fL (ref 80.0–100.0)
Monocytes Absolute: 0.3 10*3/uL (ref 0.1–1.0)
Monocytes Relative: 5 %
Neutro Abs: 2.6 10*3/uL (ref 1.7–7.7)
Neutrophils Relative %: 54 %
Platelet Count: 250 10*3/uL (ref 150–400)
RBC: 3.78 MIL/uL — ABNORMAL LOW (ref 3.87–5.11)
RDW: 16.6 % — ABNORMAL HIGH (ref 11.5–15.5)
WBC Count: 4.9 10*3/uL (ref 4.0–10.5)
nRBC: 0 % (ref 0.0–0.2)

## 2019-04-04 LAB — IRON AND TIBC
Iron: 58 ug/dL (ref 41–142)
Saturation Ratios: 15 % — ABNORMAL LOW (ref 21–57)
TIBC: 394 ug/dL (ref 236–444)
UIBC: 336 ug/dL (ref 120–384)

## 2019-04-04 LAB — FERRITIN: Ferritin: 7 ng/mL — ABNORMAL LOW (ref 11–307)

## 2019-04-04 NOTE — Assessment & Plan Note (Deleted)
Profound iron deficiency anemia due to chronic blood loss from the uterus since May 2019 Uterine ultrasound revealed fibroid Patient wants to have children and wants minimal invasive surgery. IV iron infusion: 11/23/2018 and 11/30/2018  Lab review 12/30/2018: Hemoglobin 8.8 (previously it was 9.2), MCV 91.3, ferritin 54, iron saturation 13%, TIBC 296 Patient is now normocytic. (This was a result of blood loss during hysterectomy surgery)  Labs 04/06/2019:

## 2019-04-04 NOTE — Telephone Encounter (Signed)
Per 5/11 schedule message add iv iron after 5/13 f/u. Added iron and adjusted f/u time. Confirmed with patient.

## 2019-04-06 ENCOUNTER — Inpatient Hospital Stay: Payer: Self-pay | Admitting: Hematology and Oncology

## 2019-04-06 ENCOUNTER — Inpatient Hospital Stay: Payer: Self-pay

## 2019-04-07 ENCOUNTER — Telehealth: Payer: Self-pay | Admitting: Hematology and Oncology

## 2019-04-07 NOTE — Telephone Encounter (Signed)
Called patient per 5/14 sch message - unable to reach patient . Left message for patient to call back to reschedule to a day that works best.

## 2019-06-26 ENCOUNTER — Encounter (HOSPITAL_COMMUNITY): Payer: Self-pay

## 2019-06-26 ENCOUNTER — Emergency Department (HOSPITAL_COMMUNITY)
Admission: EM | Admit: 2019-06-26 | Discharge: 2019-06-26 | Discharge status: Against Medical Advice | Payer: Self-pay | Attending: Emergency Medicine | Admitting: Emergency Medicine

## 2019-06-26 ENCOUNTER — Other Ambulatory Visit: Payer: Self-pay

## 2019-06-26 DIAGNOSIS — R42 Dizziness and giddiness: Secondary | ICD-10-CM | POA: Insufficient documentation

## 2019-06-26 DIAGNOSIS — Z5321 Procedure and treatment not carried out due to patient leaving prior to being seen by health care provider: Secondary | ICD-10-CM | POA: Insufficient documentation

## 2019-06-26 LAB — BASIC METABOLIC PANEL
Anion gap: 13 (ref 5–15)
BUN: 8 mg/dL (ref 6–20)
CO2: 23 mmol/L (ref 22–32)
Calcium: 9 mg/dL (ref 8.9–10.3)
Chloride: 105 mmol/L (ref 98–111)
Creatinine, Ser: 0.59 mg/dL (ref 0.44–1.00)
GFR calc Af Amer: >60 mL/min (ref >60)
GFR calc non Af Amer: >60 mL/min (ref >60)
Glucose, Bld: 93 mg/dL (ref 70–99)
Potassium: 3.3 mmol/L — ABNORMAL LOW (ref 3.5–5.1)
Sodium: 141 mmol/L (ref 135–145)

## 2019-06-26 LAB — CBC
HCT: 35.2 % — ABNORMAL LOW (ref 36.0–46.0)
Hemoglobin: 12 g/dL (ref 12.0–15.0)
MCH: 30.8 pg (ref 26.0–34.0)
MCHC: 34.1 g/dL (ref 30.0–36.0)
MCV: 90.3 fL (ref 80.0–100.0)
Platelets: 265 10*3/uL (ref 150–400)
RBC: 3.9 MIL/uL (ref 3.87–5.11)
RDW: 15.3 % (ref 11.5–15.5)
WBC: 6.9 10*3/uL (ref 4.0–10.5)
nRBC: 0 % (ref 0.0–0.2)

## 2019-06-26 LAB — CBG MONITORING, ED: Glucose-Capillary: 87 mg/dL (ref 70–99)

## 2019-06-26 LAB — I-STAT BETA HCG BLOOD, ED (MC, WL, AP ONLY): I-stat hCG, quantitative: <5 m[IU]/mL (ref <5)

## 2019-06-26 MED ORDER — SODIUM CHLORIDE 0.9% FLUSH: 3.0000 mL | Freq: Once | INTRAVENOUS | Status: DC

## 2019-06-26 NOTE — ED Triage Notes (Addendum)
Pt states she was given rx for zoloft for several years. Pt states today after she had it she felt different. Pt states she has thrown up twice. Pt states that she feels faint and that her balance is off. Pt states that she is shaking. Pt states that when she closes her eyes she feels as though she is going to pass out. Pt is intoxicated appearing- pt denies any ETOH or drug use today.

## 2019-06-26 NOTE — ED Notes (Signed)
Called x3 for room in back no answer

## 2019-10-09 ENCOUNTER — Ambulatory Visit (HOSPITAL_COMMUNITY)
Admission: EM | Admit: 2019-10-09 | Discharge: 2019-10-09 | Disposition: A | Discharge status: Home / Self Care | Payer: Self-pay | Attending: Emergency Medicine | Admitting: Emergency Medicine

## 2019-10-09 ENCOUNTER — Other Ambulatory Visit: Payer: Self-pay

## 2019-10-09 DIAGNOSIS — Z20828 Contact with and (suspected) exposure to other viral communicable diseases: Secondary | ICD-10-CM | POA: Insufficient documentation

## 2019-10-09 DIAGNOSIS — Z20822 Contact with and (suspected) exposure to covid-19: Secondary | ICD-10-CM

## 2019-10-09 NOTE — Discharge Instructions (Addendum)
Self isolate until covid results are back and negative.  Will notify you by phone of any positive findings. Your negative results will be sent through your MyChart.     It is possible for a false negative, you are potentially at risk for covid infection for 14 days after exposure.

## 2019-10-09 NOTE — ED Provider Notes (Signed)
[place]    CSN: [id number] Arrival date & time: 10/09/19  1449      History   Chief Complaint Chief Complaint  Patient presents with  . Exposure Covid 19    HPI HYLEE BEBB is a 33 y.o. female.   [name] presents with concerns about covid-19 exposure. She was around a family member two days ago who then tested positive yesterday. She feels like two days ago she had slight sore throat and today with occasional cough. No fevers. No headache. No body aches. No congestion. No gi symptoms. Her daughter who also presents has had a mild cough for a few days. Hasn't taken any medications for symptoms. History of allergies, migraines, depression, fibromyalgia, gerd, htn, seizures.     ROS per HPI, negative if not otherwise mentioned.      Past Medical History:  Diagnosis Date  . Allergic rhinitis 01/24/2015  . Arrhythmia Dx 2015  . Back pain   . Breast mass   . Common migraine 04/15/2017  . Depression Dx 2000  . Fibromyalgia   . GERD (gastroesophageal reflux disease) 03/13/2015  . Headache    migraines  . Heart murmur Dx 2015  . 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 Hypertension Dx [id number]  . Lumbar radicular pain 11/22/2014  . Pain disorder 08/29/2017  . Panic attack Dx 2006  . Positive depression screening 08/29/2017  . PTSD (post-traumatic stress disorder) Dx 2013  . Seizures ([place]) 2001  . Vitamin D deficiency 01/24/2015    Patient Active Problem List   Diagnosis Date Noted  . Iron deficiency anemia due to chronic blood loss 11/22/2018  . Pelvic pain in female 11/08/2018  . Hypertension 08/30/2018  . DUB (dysfunctional uterine bleeding) 06/17/2018  . Fibroid uterus 01/18/2018  . Cervical radiculopathy 01/01/2018  . Pain disorder 08/29/2017  . Neck pain without injury 08/24/2017  . Chronic fatigue 05/15/2017  . Common migraine 04/15/2017  . Axillary mass, right 04/09/2017  . History of bulimia nervosa 03/13/2015  . GERD  (gastroesophageal reflux disease) 03/13/2015  . SOB (shortness of breath) 03/13/2015  . H/O vitamin D deficiency 03/13/2015  . Health care maintenance 01/25/2015  . Chronic tension type headache 01/25/2015  . Muscle pain, fibromyalgia 01/24/2015  . Allergic rhinitis 01/24/2015  . Lumbar radicular pain 11/22/2014  . Thoracic neuralgia 11/22/2014  . Back pain   . Abdominal pain 06/06/2012  . Chronic pain 06/06/2012  . Depression 06/06/2012  . HEMOPTYSIS UNSPECIFIED 12/14/2009  . BREAST TENDERNESS 11/08/2009  . NIPPLE DISCHARGE 11/08/2009  . MENORRHAGIA 11/08/2009  . DISORDER, BIPOLAR NOS 08/06/2007  . PERSONALITY DISORDER 08/06/2007  . MARIJUANA ABUSE 08/06/2007  . NARCOTIC ABUSE 08/06/2007  . SEIZURE DISORDER 08/06/2007  . 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 BREAST BIOPSY Left   . ESOPHAGOGASTRODUODENOSCOPY N/A 08/14/2015   Procedure: ESOPHAGOGASTRODUODENOSCOPY (EGD);  Surgeon: Carol Ada, MD;  Location: [facility];  Service: Endoscopy;  Laterality: N/A;    OB History    Gravida  1   Para  0   Term  0   Preterm  0   AB  1   Living  0     SAB  1   TAB  0   Ectopic  0   Multiple  0   Live Births  0            Home Medications    Prior to Admission  medications   Medication Sig Start Date End Date Taking? Authorizing Provider  diclofenac sodium (VOLTAREN) 1 % GEL Apply 2 g topically 4 (four) times daily. 05/25/18   Ladell Pier, MD  gabapentin (NEURONTIN) 300 MG capsule Take 1 cap in AM, 2 caps in PM 03/23/18   Cameron Sprang, MD  HYDROcodone-acetaminophen (NORCO) 5-325 MG tablet Take 1-2 tablets by mouth 3 (three) times daily as needed for moderate pain or severe pain. Patient not taking: Reported on 12/07/2018 12/01/18   Caren Macadam, MD  lidocaine (LIDODERM) 5 % Place 1 patch onto the skin daily. Remove & Discard patch within 12 hours or as directed by MD 09/13/18   [name], MD   megestrol (MEGACE) 40 MG tablet Take 1 tablet (40 mg total) by mouth 2 (two) times daily. 10/19/18   Woodroe Mode, MD  meloxicam (MOBIC) 7.5 MG tablet Take 1 tablet (7.5 mg total) by mouth daily. 09/13/18   [name], MD  metoprolol succinate (TOPROL-XL) 100 MG 24 hr tablet Take 1 tablet (100 mg total) by mouth daily. 01/01/18   [name], MD  naproxen (EC NAPROSYN) 500 MG EC tablet Take 1 tablet (500 mg total) by mouth 3 (three) times daily with meals. Patient not taking: Reported on 12/07/2018 11/08/18   Woodroe Mode, MD  Norethindrone-Mestranol [facility]) 1-50 MG-MCG tablet Take 1 tablet by mouth daily. Do not take placebo pill until further instruction. One twice a day until bleeding stops Patient not taking: Reported on 12/07/2018 10/18/18   Woodroe Mode, MD  norgestimate-ethinyl estradiol (ORTHO-CYCLEN,SPRINTEC,PREVIFEM) 0.25-35 MG-MCG tablet Take 1 tablet by mouth daily. 2 pills a day until no bleeding then 1 daily. 06/17/18   Woodroe Mode, MD  ondansetron (ZOFRAN) 4 MG tablet Take 1 tablet (4 mg total) by mouth every 6 (six) hours. 01/15/18   Hayden Rasmussen, MD  Prenatal Vit-Fe Fumarate-FA (PRENATAL VITAMIN PO) Take 1 tablet by mouth daily.    [provider]  sertraline (ZOLOFT) 50 MG tablet Take 1 tablet (50 mg total) by mouth daily. 06/01/18   Kirsteins, Luanna Salk, MD    Family History Family History  Problem Relation Age of Onset  . Heart attack Mother   . Heart disease Mother   . Sudden death Mother   . Brain cancer Mother   . Sudden death Sister   . Heart attack Sister   . Sudden death Maternal Grandmother   . Fainting Maternal Grandmother   . Heart attack Maternal Grandmother   . Breast cancer Paternal Grandmother   . Breast cancer Cousin   . Breast cancer Paternal Aunt   . Seizures Maternal Aunt     Social History Social History   Tobacco Use  . Smoking status: Former Smoker    Quit date: 06/23/2012    Years since quitting: 7.2  .  Smokeless tobacco: Never Used  Substance Use Topics  . Alcohol use: Not Currently    Comment: rarely  . Drug use: No     Allergies   Aspirin and Contrast media [iodinated diagnostic agents]   Review of Systems Review of Systems   Physical Exam Triage Vital Signs ED Triage Vitals  Enc Vitals Group     BP 10/09/19 1625 131/79     Pulse Rate 10/09/19 1625 88     Resp 10/09/19 1625 16     Temp 10/09/19 1625 98.6 F (37 C)     Temp Source 10/09/19 1625 Oral  SpO2 10/09/19 1625 100 %     Weight --      Height --      Head Circumference --      Peak Flow --      Pain Score 10/09/19 1623 0     Pain Loc --      Pain Edu? --      Excl. in [place]? --    No data found.  Updated Vital Signs BP 131/79 (BP Location: Left Arm)   Pulse 88   Temp 98.6 F (37 C) (Oral)   Resp 16   LMP 10/05/2019   SpO2 100%    Physical Exam Constitutional:      General: She is not in acute distress.    Appearance: She is well-developed.  Cardiovascular:     Rate and Rhythm: Normal rate.  Pulmonary:     Effort: Pulmonary effort is normal.  Skin:    General: Skin is warm and dry.  Neurological:     Mental Status: She is alert and oriented to person, place, and time.      UC Treatments / Results  Labs (all labs ordered are listed, but only abnormal results are displayed) Labs Reviewed  NOVEL CORONAVIRUS, NAA (HOSP ORDER, SEND-OUT TO REF LAB; TAT 18-24 HRS)    EKG   Radiology No results found.  Procedures Procedures (including critical care time)  Medications Ordered in UC Medications - No data to display  Initial Impression / Assessment and Plan / UC Course  I have reviewed the triage vital signs and the nursing notes.  Pertinent labs & imaging results that were available during my care of the patient were reviewed by me and considered in my medical decision making (see chart for details).     Non toxic. Benign physical exam.  History and physical consistent with  viral illness. Daughter also with cough. covid exposure with testing collected and pending. Supportive cares recommended. Will notify of any positive findings and if any changes to treatment are needed.  Return precautions provided. Patient verbalized understanding and agreeable to plan.   Final Clinical Impressions(s) / UC Diagnoses   Final diagnoses:  Exposure to COVID-19 virus     Discharge Instructions     Self isolate until covid results are back and negative.  Will notify you by phone of any positive findings. Your negative results will be sent through your MyChart.     It is possible for a false negative, you are potentially at risk for covid infection for 14 days after exposure.    ED Prescriptions    None     PDMP not reviewed this encounter.   Zigmund Gottron, NP 10/09/19 1743

## 2019-10-09 NOTE — ED Triage Notes (Signed)
Pt has been exposure to covid 66 from a friend of hers. Pt denies any symptoms.

## 2019-10-10 LAB — NOVEL CORONAVIRUS, NAA (HOSP ORDER, SEND-OUT TO REF LAB; TAT 18-24 HRS): SARS-CoV-2, NAA: NOT DETECTED

## 2020-01-15 ENCOUNTER — Ambulatory Visit (INDEPENDENT_AMBULATORY_CARE_PROVIDER_SITE_OTHER): Payer: Self-pay

## 2020-01-15 ENCOUNTER — Other Ambulatory Visit: Payer: Self-pay

## 2020-01-15 ENCOUNTER — Encounter (HOSPITAL_COMMUNITY): Payer: Self-pay

## 2020-01-15 ENCOUNTER — Ambulatory Visit (HOSPITAL_COMMUNITY)
Admission: EM | Admit: 2020-01-15 | Discharge: 2020-01-15 | Disposition: A | Discharge status: Home / Self Care | Payer: Self-pay | Attending: Family Medicine | Admitting: Family Medicine

## 2020-01-15 DIAGNOSIS — M25511 Pain in right shoulder: Secondary | ICD-10-CM

## 2020-01-15 DIAGNOSIS — M25551 Pain in right hip: Secondary | ICD-10-CM

## 2020-01-15 DIAGNOSIS — M542 Cervicalgia: Secondary | ICD-10-CM

## 2020-01-15 DIAGNOSIS — W19XXXA Unspecified fall, initial encounter: Secondary | ICD-10-CM

## 2020-01-15 DIAGNOSIS — I10 Essential (primary) hypertension: Secondary | ICD-10-CM

## 2020-01-15 DIAGNOSIS — W101XXA Fall (on)(from) sidewalk curb, initial encounter: Secondary | ICD-10-CM

## 2020-01-15 MED ORDER — CYCLOBENZAPRINE HCL 10 MG PO TABS: 10.0000 mg | ORAL_TABLET | Freq: Two times a day (BID) | ORAL | 0 refills | Status: DC | PRN

## 2020-01-15 NOTE — ED Provider Notes (Signed)
[place]    CSN: [id number] Arrival date & time: 01/15/20  1433      History   Chief Complaint Chief Complaint  Patient presents with  . Fall    HPI [name] is a 34 y.o. female.   Patient reports that she had a mechanical fall yesterday off of a curb.  Reports that she landed on her right hip and right shoulder and has been having generalized body soreness, but her neck is bothering her the worst Reports that she has only tried to take a warm bath, with little relief.  Denies taking any medications to help with this.  Patient reports that she is prone to falls, but does not provide a reason for this.  Denies headache, fever, nausea, vomiting, diarrhea, chills, body aches, rash, other symptoms.  ROS per HPI  The history is provided by the patient.    Past Medical History:  Diagnosis Date  . Allergic rhinitis 01/24/2015  . Arrhythmia Dx 2015  . Back pain   . Breast mass   . Common migraine 04/15/2017  . Depression Dx 2000  . Fibromyalgia   . GERD (gastroesophageal reflux disease) 03/13/2015  . Headache    migraines  . Heart murmur Dx 2015  . 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 Hypertension Dx [id number]  . Lumbar radicular pain 11/22/2014  . Pain disorder 08/29/2017  . Panic attack Dx 2006  . Positive depression screening 08/29/2017  . PTSD (post-traumatic stress disorder) Dx 2013  . Seizures ([place]) 2001  . Vitamin D deficiency 01/24/2015    Patient Active Problem List   Diagnosis Date Noted  . Iron deficiency anemia due to chronic blood loss 11/22/2018  . Pelvic pain in female 11/08/2018  . Hypertension 08/30/2018  . DUB (dysfunctional uterine bleeding) 06/17/2018  . Fibroid uterus 01/18/2018  . Cervical radiculopathy 01/01/2018  . Pain disorder 08/29/2017  . Neck pain without injury 08/24/2017  . Chronic fatigue 05/15/2017  . Common migraine 04/15/2017  . Axillary mass, right 04/09/2017  . History of bulimia nervosa 03/13/2015  .  GERD (gastroesophageal reflux disease) 03/13/2015  . SOB (shortness of breath) 03/13/2015  . H/O vitamin D deficiency 03/13/2015  . Health care maintenance 01/25/2015  . Chronic tension type headache 01/25/2015  . Muscle pain, fibromyalgia 01/24/2015  . Allergic rhinitis 01/24/2015  . Lumbar radicular pain 11/22/2014  . Thoracic neuralgia 11/22/2014  . Back pain   . Abdominal pain 06/06/2012  . Chronic pain 06/06/2012  . Depression 06/06/2012  . HEMOPTYSIS UNSPECIFIED 12/14/2009  . BREAST TENDERNESS 11/08/2009  . NIPPLE DISCHARGE 11/08/2009  . MENORRHAGIA 11/08/2009  . DISORDER, BIPOLAR NOS 08/06/2007  . PERSONALITY DISORDER 08/06/2007  . MARIJUANA ABUSE 08/06/2007  . NARCOTIC ABUSE 08/06/2007  . SEIZURE DISORDER 08/06/2007  . 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 BREAST BIOPSY Left   . ESOPHAGOGASTRODUODENOSCOPY N/A 08/14/2015   Procedure: ESOPHAGOGASTRODUODENOSCOPY (EGD);  Surgeon: Carol Ada, MD;  Location: [facility];  Service: Endoscopy;  Laterality: N/A;    OB History    Gravida  1   Para  0   Term  0   Preterm  0   AB  1   Living  0     SAB  1   TAB  0   Ectopic  0   Multiple  0   Live Births  0  Home Medications    Prior to Admission medications   Medication Sig Start Date End Date Taking? Authorizing Provider  cyclobenzaprine (FLEXERIL) 10 MG tablet Take 1 tablet (10 mg total) by mouth 2 (two) times daily as needed for muscle spasms. 01/15/20   Faustino Congress, NP  diclofenac sodium (VOLTAREN) 1 % GEL Apply 2 g topically 4 (four) times daily. 05/25/18   Ladell Pier, MD  gabapentin (NEURONTIN) 300 MG capsule Take 1 cap in AM, 2 caps in PM 03/23/18   Cameron Sprang, MD  HYDROcodone-acetaminophen (NORCO) 5-325 MG tablet Take 1-2 tablets by mouth 3 (three) times daily as needed for moderate pain or severe pain. Patient not taking: Reported on 12/07/2018 12/01/18   Caren Macadam, MD  lidocaine (LIDODERM) 5 % Place 1 patch onto the skin daily. Remove & Discard patch within 12 hours or as directed by MD 09/13/18   [name], MD  megestrol (MEGACE) 40 MG tablet Take 1 tablet (40 mg total) by mouth 2 (two) times daily. 10/19/18   Woodroe Mode, MD  meloxicam (MOBIC) 7.5 MG tablet Take 1 tablet (7.5 mg total) by mouth daily. 09/13/18   [name], MD  metoprolol succinate (TOPROL-XL) 100 MG 24 hr tablet Take 1 tablet (100 mg total) by mouth daily. 01/01/18   [name], MD  naproxen (EC NAPROSYN) 500 MG EC tablet Take 1 tablet (500 mg total) by mouth 3 (three) times daily with meals. Patient not taking: Reported on 12/07/2018 11/08/18   Woodroe Mode, MD  Norethindrone-Mestranol [facility]) 1-50 MG-MCG tablet Take 1 tablet by mouth daily. Do not take placebo pill until further instruction. One twice a day until bleeding stops Patient not taking: Reported on 12/07/2018 10/18/18   Woodroe Mode, MD  norgestimate-ethinyl estradiol (ORTHO-CYCLEN,SPRINTEC,PREVIFEM) 0.25-35 MG-MCG tablet Take 1 tablet by mouth daily. 2 pills a day until no bleeding then 1 daily. 06/17/18   Woodroe Mode, MD  ondansetron (ZOFRAN) 4 MG tablet Take 1 tablet (4 mg total) by mouth every 6 (six) hours. 01/15/18   Hayden Rasmussen, MD  Prenatal Vit-Fe Fumarate-FA (PRENATAL VITAMIN PO) Take 1 tablet by mouth daily.    [provider]  sertraline (ZOLOFT) 50 MG tablet Take 1 tablet (50 mg total) by mouth daily. 06/01/18   Kirsteins, Luanna Salk, MD    Family History Family History  Problem Relation Age of Onset  . Heart attack Mother   . Heart disease Mother   . Sudden death Mother   . Brain cancer Mother   . Sudden death Sister   . Heart attack Sister   . Sudden death Maternal Grandmother   . Fainting Maternal Grandmother   . Heart attack Maternal Grandmother   . Breast cancer Paternal Grandmother   . Breast cancer Cousin   . Breast cancer Paternal Aunt   . Seizures  Maternal Aunt     Social History Social History   Tobacco Use  . Smoking status: Former Smoker    Quit date: 06/23/2012    Years since quitting: 7.5  . Smokeless tobacco: Never Used  Substance Use Topics  . Alcohol use: Not Currently    Comment: rarely  . Drug use: No     Allergies   Aspirin and Contrast media [iodinated diagnostic agents]   Review of Systems Review of Systems   Physical Exam Triage Vital Signs ED Triage Vitals  Enc Vitals Group     BP 01/15/20 1453 (!) 144/89  Pulse Rate 01/15/20 1453 (!) 101     Resp 01/15/20 1453 16     Temp 01/15/20 1453 98.1 F (36.7 C)     Temp Source 01/15/20 1453 Oral     SpO2 01/15/20 1453 99 %     Weight --      Height --      Head Circumference --      Peak Flow --      Pain Score 01/15/20 1454 9     Pain Loc --      Pain Edu? --      Excl. in [place]? --    No data found.  Updated Vital Signs BP (!) 144/89 (BP Location: Left Arm)   Pulse (!) 101   Temp 98.1 F (36.7 C) (Oral)   Resp 16   LMP 01/11/2020   SpO2 99%   Visual Acuity Right Eye Distance:   Left Eye Distance:   Bilateral Distance:    Right Eye Near:   Left Eye Near:    Bilateral Near:     Physical Exam Vitals and nursing note reviewed.  Constitutional:      General: She is not in acute distress.    Appearance: Normal appearance. She is well-developed.  HENT:     Head: Normocephalic and atraumatic.     Nose: Nose normal.  Eyes:     Conjunctiva/sclera: Conjunctivae normal.  Cardiovascular:     Rate and Rhythm: Normal rate and regular rhythm.     Heart sounds: Normal heart sounds. No murmur.  Pulmonary:     Effort: Pulmonary effort is normal. No respiratory distress.     Breath sounds: Normal breath sounds.  Abdominal:     General: Abdomen is flat.     Palpations: Abdomen is soft.     Tenderness: There is no abdominal tenderness.  Musculoskeletal:     Cervical back: Neck supple.  Skin:    General: Skin is warm and dry.      Capillary Refill: Capillary refill takes less than 2 seconds.  Neurological:     General: No focal deficit present.     Mental Status: She is alert and oriented to person, place, and time.  Psychiatric:        Mood and Affect: Mood normal.        Behavior: Behavior normal.      UC Treatments / Results  Labs (all labs ordered are listed, but only abnormal results are displayed) Labs Reviewed - No data to display  EKG   Radiology DG Cervical Spine Complete  Result Date: 01/15/2020 CLINICAL DATA:  34 year old female with fall and neck pain. EXAM: CERVICAL SPINE - COMPLETE 4+ VIEW COMPARISON:  Cervical spine radiograph dated 12/07/2018. FINDINGS: There is no acute fracture or subluxation. Mild degenerative changes and spurring primarily at C5-C6. There is straightening of normal cervical lordosis which may be positional or due to muscle spasm. The visualized posterior elements and odontoid appear intact. The neural foramina are patent. There is anatomic alignment of the lateral masses of C1 and C2. The soft tissues are unremarkable. IMPRESSION: No acute/traumatic cervical spine pathology. Electronically Signed   By: Anner Crete M.D.   On: 01/15/2020 15:39   DG Shoulder Right  Result Date: 01/15/2020 CLINICAL DATA:  Right shoulder pain since a slip and fall on ice yesterday. Initial encounter. EXAM: RIGHT SHOULDER - 2+ VIEW COMPARISON:  None. FINDINGS: There is no evidence of fracture or dislocation. There is no evidence of arthropathy or  other focal bone abnormality. Soft tissues are unremarkable. IMPRESSION: Normal exam. Electronically Signed   By: Inge Rise M.D.   On: 01/15/2020 15:42   DG Hip Unilat With Pelvis 2-3 Views Right  Result Date: 01/15/2020 CLINICAL DATA:  Right hip pain since a slip and fall on ice yesterday. Initial encounter. EXAM: DG HIP (WITH OR WITHOUT PELVIS) 2-3V RIGHT COMPARISON:  None. FINDINGS: There is no evidence of hip fracture or dislocation. There  is no evidence of arthropathy or other focal bone abnormality. IMPRESSION: Normal exam. Electronically Signed   By: Inge Rise M.D.   On: 01/15/2020 15:43    Procedures Procedures (including critical care time)  Medications Ordered in UC Medications - No data to display  Initial Impression / Assessment and Plan / UC Course  I have reviewed the triage vital signs and the nursing notes.  Pertinent labs & imaging results that were available during my care of the patient were reviewed by me and considered in my medical decision making (see chart for details).     X-rays negative for acute abnormalities.  Flexeril sent in as needed for muscle spasm.  Instructed not to drive, operate heavy machinery while taking this medication.  Instructed to follow-up with orthopedics as needed.  Instructed on when to go to the ER. Final Clinical Impressions(s) / UC Diagnoses   Final diagnoses:  Fall, initial encounter  Neck pain  Fall  Acute pain of right shoulder  Hip pain, acute, right     Discharge Instructions     You are going to be sore from your fall for a few days.  I have sent in  Flexeril which is a muscle relaxer that you may use as needed every 6 hours.    The Flexeril is likely to make you very sleepy.  Do not drive a car do not operate heavy machinery while taking this medication.  Your x-rays in office today were negative.  Follow-up with your primary care provider.  Go to the ER with sudden loss of sensation, loss of bowel or bladder control, passing out, or other concerning symptoms.    ED Prescriptions    Medication Sig Dispense Auth. Provider   cyclobenzaprine (FLEXERIL) 10 MG tablet Take 1 tablet (10 mg total) by mouth 2 (two) times daily as needed for muscle spasms. 20 tablet Faustino Congress, NP     I have reviewed the PDMP during this encounter.   Faustino Congress, NP 01/15/20 1627

## 2020-01-15 NOTE — ED Triage Notes (Signed)
Pt presents with pain in left side of neck and limited ROM after a fall on a patch of ice yesterday.

## 2020-01-15 NOTE — Discharge Instructions (Addendum)
You are going to be sore from your fall for a few days.  I have sent in  Flexeril which is a muscle relaxer that you may use as needed every 6 hours.    The Flexeril is likely to make you very sleepy.  Do not drive a car do not operate heavy machinery while taking this medication.  Your x-rays in office today were negative.  Follow-up with your primary care provider.  Go to the ER with sudden loss of sensation, loss of bowel or bladder control, passing out, or other concerning symptoms.

## 2020-04-19 ENCOUNTER — Encounter (HOSPITAL_BASED_OUTPATIENT_CLINIC_OR_DEPARTMENT_OTHER): Payer: Self-pay | Admitting: *Deleted

## 2020-04-19 ENCOUNTER — Emergency Department (HOSPITAL_BASED_OUTPATIENT_CLINIC_OR_DEPARTMENT_OTHER)
Admission: EM | Admit: 2020-04-19 | Discharge: 2020-04-19 | Disposition: A | Discharge status: Home / Self Care | Payer: Self-pay | Attending: Emergency Medicine | Admitting: Emergency Medicine

## 2020-04-19 ENCOUNTER — Other Ambulatory Visit: Payer: Self-pay

## 2020-04-19 DIAGNOSIS — R519 Headache, unspecified: Secondary | ICD-10-CM | POA: Insufficient documentation

## 2020-04-19 DIAGNOSIS — R1011 Right upper quadrant pain: Secondary | ICD-10-CM | POA: Insufficient documentation

## 2020-04-19 DIAGNOSIS — M79604 Pain in right leg: Secondary | ICD-10-CM | POA: Insufficient documentation

## 2020-04-19 DIAGNOSIS — Z5321 Procedure and treatment not carried out due to patient leaving prior to being seen by health care provider: Secondary | ICD-10-CM | POA: Insufficient documentation

## 2020-04-19 NOTE — ED Notes (Signed)
Called from [facility] for reassessment, no answer from lobby

## 2020-04-19 NOTE — ED Triage Notes (Signed)
Pt states she had an LP yesterday for brain tumor evaluation. She had a headache, right leg and right upper abdominal pain today.

## 2020-04-20 ENCOUNTER — Emergency Department (HOSPITAL_BASED_OUTPATIENT_CLINIC_OR_DEPARTMENT_OTHER)
Admission: EM | Admit: 2020-04-20 | Discharge: 2020-04-20 | Disposition: A | Discharge status: Home / Self Care | Payer: Self-pay | Attending: Emergency Medicine | Admitting: Emergency Medicine

## 2020-04-20 ENCOUNTER — Other Ambulatory Visit: Payer: Self-pay

## 2020-04-20 ENCOUNTER — Encounter (HOSPITAL_BASED_OUTPATIENT_CLINIC_OR_DEPARTMENT_OTHER): Payer: Self-pay | Admitting: Emergency Medicine

## 2020-04-20 DIAGNOSIS — R519 Headache, unspecified: Secondary | ICD-10-CM | POA: Insufficient documentation

## 2020-04-20 DIAGNOSIS — R300 Dysuria: Secondary | ICD-10-CM | POA: Insufficient documentation

## 2020-04-20 DIAGNOSIS — Z5321 Procedure and treatment not carried out due to patient leaving prior to being seen by health care provider: Secondary | ICD-10-CM | POA: Insufficient documentation

## 2020-04-20 HISTORY — DX: Benign neoplasm of brain, unspecified: D33.2

## 2020-04-20 NOTE — ED Triage Notes (Signed)
Pt had LP on 05-26 at [facility] to evaluate brain tumor.  Pt states she is having complication from that, headache and some dysuria.  Pt also having some difficulty walking.

## 2020-04-21 ENCOUNTER — Emergency Department (HOSPITAL_COMMUNITY): Payer: Self-pay

## 2020-04-21 ENCOUNTER — Other Ambulatory Visit: Payer: Self-pay

## 2020-04-21 ENCOUNTER — Emergency Department (HOSPITAL_COMMUNITY)
Admission: EM | Admit: 2020-04-21 | Discharge: 2020-04-21 | Disposition: A | Discharge status: Home / Self Care | Payer: Self-pay | Attending: Emergency Medicine | Admitting: Emergency Medicine

## 2020-04-21 DIAGNOSIS — Z87891 Personal history of nicotine dependence: Secondary | ICD-10-CM | POA: Insufficient documentation

## 2020-04-21 DIAGNOSIS — G971 Other reaction to spinal and lumbar puncture: Secondary | ICD-10-CM | POA: Insufficient documentation

## 2020-04-21 DIAGNOSIS — R519 Headache, unspecified: Secondary | ICD-10-CM

## 2020-04-21 DIAGNOSIS — Z9889 Other specified postprocedural states: Secondary | ICD-10-CM

## 2020-04-21 DIAGNOSIS — I10 Essential (primary) hypertension: Secondary | ICD-10-CM | POA: Insufficient documentation

## 2020-04-21 DIAGNOSIS — Z79899 Other long term (current) drug therapy: Secondary | ICD-10-CM | POA: Insufficient documentation

## 2020-04-21 LAB — CBC WITH DIFFERENTIAL/PLATELET
Abs Immature Granulocytes: 0.02 10*3/uL (ref 0.00–0.07)
Basophils Absolute: 0 10*3/uL (ref 0.0–0.1)
Basophils Relative: 0 %
Eosinophils Absolute: 0 10*3/uL (ref 0.0–0.5)
Eosinophils Relative: 0 %
HCT: 34.7 % — ABNORMAL LOW (ref 36.0–46.0)
Hemoglobin: 12 g/dL (ref 12.0–15.0)
Immature Granulocytes: 0 %
Lymphocytes Relative: 20 %
Lymphs Abs: 1.4 10*3/uL (ref 0.7–4.0)
MCH: 29.9 pg (ref 26.0–34.0)
MCHC: 34.6 g/dL (ref 30.0–36.0)
MCV: 86.5 fL (ref 80.0–100.0)
Monocytes Absolute: 0.4 10*3/uL (ref 0.1–1.0)
Monocytes Relative: 5 %
Neutro Abs: 5.1 10*3/uL (ref 1.7–7.7)
Neutrophils Relative %: 75 %
Platelets: 277 10*3/uL (ref 150–400)
RBC: 4.01 MIL/uL (ref 3.87–5.11)
RDW: 14 % (ref 11.5–15.5)
WBC: 6.8 10*3/uL (ref 4.0–10.5)
nRBC: 0 % (ref 0.0–0.2)

## 2020-04-21 LAB — URINALYSIS, ROUTINE W REFLEX MICROSCOPIC
Bacteria, UA: NONE SEEN
Bilirubin Urine: NEGATIVE
Glucose, UA: NEGATIVE mg/dL
Hgb urine dipstick: NEGATIVE
Ketones, ur: 20 mg/dL — AB
Leukocytes,Ua: NEGATIVE
Nitrite: NEGATIVE
Protein, ur: 30 mg/dL — AB
Specific Gravity, Urine: 1.019 (ref 1.005–1.030)
pH: 6 (ref 5.0–8.0)

## 2020-04-21 LAB — COMPREHENSIVE METABOLIC PANEL
ALT: 10 U/L (ref 0–44)
AST: 14 U/L — ABNORMAL LOW (ref 15–41)
Albumin: 4.6 g/dL (ref 3.5–5.0)
Alkaline Phosphatase: 63 U/L (ref 38–126)
Anion gap: 9 (ref 5–15)
BUN: 16 mg/dL (ref 6–20)
CO2: 24 mmol/L (ref 22–32)
Calcium: 9.3 mg/dL (ref 8.9–10.3)
Chloride: 108 mmol/L (ref 98–111)
Creatinine, Ser: 0.58 mg/dL (ref 0.44–1.00)
GFR calc Af Amer: >60 mL/min (ref >60)
GFR calc non Af Amer: >60 mL/min (ref >60)
Glucose, Bld: 99 mg/dL (ref 70–99)
Potassium: 3.7 mmol/L (ref 3.5–5.1)
Sodium: 141 mmol/L (ref 135–145)
Total Bilirubin: 1.1 mg/dL (ref 0.3–1.2)
Total Protein: 7.7 g/dL (ref 6.5–8.1)

## 2020-04-21 LAB — I-STAT BETA HCG BLOOD, ED (MC, WL, AP ONLY): I-stat hCG, quantitative: <5 m[IU]/mL (ref <5)

## 2020-04-21 LAB — LIPASE, BLOOD: Lipase: 21 U/L (ref 11–51)

## 2020-04-21 MED ORDER — SODIUM CHLORIDE 0.9 % IV SOLN
500.0000 mg | Freq: Once | INTRAVENOUS | Status: AC
  Administered 2020-04-21: 500 mg via INTRAVENOUS
  Filled 2020-04-21: qty 2

## 2020-04-21 MED ORDER — BUTALBITAL-APAP-CAFFEINE 50-325-40 MG PO TABS: 1.0000 | ORAL_TABLET | Freq: Four times a day (QID) | ORAL | 0 refills | Status: AC | PRN

## 2020-04-21 MED ORDER — ONDANSETRON 4 MG PO TBDP
4.0000 mg | ORAL_TABLET | Freq: Three times a day (TID) | ORAL | 0 refills | Status: DC | PRN
Start: 2020-04-21 — End: 2022-03-25

## 2020-04-21 MED ORDER — KETOROLAC TROMETHAMINE 30 MG/ML IJ SOLN
30.0000 mg | Freq: Once | INTRAMUSCULAR | Status: AC
  Administered 2020-04-21: 30 mg via INTRAVENOUS
  Filled 2020-04-21: qty 1

## 2020-04-21 MED ORDER — DIPHENHYDRAMINE HCL 50 MG/ML IJ SOLN
25.0000 mg | Freq: Once | INTRAMUSCULAR | Status: AC
  Administered 2020-04-21: 25 mg via INTRAVENOUS
  Filled 2020-04-21: qty 1

## 2020-04-21 MED ORDER — SODIUM CHLORIDE 0.9 % IV BOLUS
1000.0000 mL | Freq: Once | INTRAVENOUS | Status: AC
  Administered 2020-04-21: 1000 mL via INTRAVENOUS

## 2020-04-21 MED ORDER — PROCHLORPERAZINE EDISYLATE 10 MG/2ML IJ SOLN
10.0000 mg | Freq: Once | INTRAMUSCULAR | Status: AC
  Administered 2020-04-21: 10 mg via INTRAVENOUS
  Filled 2020-04-21: qty 2

## 2020-04-21 NOTE — ED Provider Notes (Signed)
[name] DEPT Provider Note   CSN: [id number] Arrival date & time: 04/21/20  [id number]     History Chief Complaint  Patient presents with  . Headache    [name] is a 34 y.o. female with history of migraine headaches, fibromyalgia, GERD, hypertension, panic disorder, PTSD, left-sided trigeminal neuropathy presents for evaluation of gradual onset progressively worsening headache and low back pain secondary to lumbar puncture on 04/18/2020 3 days ago.  She has been following with neurology at [facility] for evaluation of an abnormal brain MRI.  Per chart review her neurologist recommended repeat brain MRI and lumbar puncture.  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  No hemorrhage, edema, mass, or extra-axial fluid collection.   Per my interpretation of patient's lumbar puncture, LP was mildly traumatic but otherwise no concerning findings, no evidence of malignancy.  The patient reports that there were 2 attempts the day of her lumbar puncture.  She states that with the first attempt she experienced significant pressure to the right side of her abdomen and flank but this eased off as the individual performing the LP removed the needle of the first time.  She states that since the lumbar puncture she has had progressive worsening of headaches.  She states these are different from her usual chronic headaches, located along the occiput radiating up to the crown described as a throbbing pulsating pain.  Pain will worsen with cough, sneeze, laughing, sitting upright.  She reports feeling as though her neck is stiff and last night reports chills and diaphoresis.  She has not checked her temperature and does not know if she has been truly febrile.  She also reports severe low back pains, worse along the right side.  She reports feeling generally very weak.  She  has had nausea and 7-9 episodes of nonbloody nonbilious emesis since the LP.  Denies vision changes though has experienced floaters which are chronic.  She denies photophobia but has had phonophobia.  She reports severe back pains with attempts to urinate or have a bowel movement.  She denies dysuria, urgency or frequency.  Has been taking her home medications without relief.   The history is provided by the patient.       Past Medical History:  Diagnosis Date  . Allergic rhinitis 01/24/2015  . Arrhythmia Dx 2015  . Back pain   . Brain tumor (benign) ([place])   . Breast mass   . Common migraine 04/15/2017  . Depression Dx 2000  . Fibromyalgia   . GERD (gastroesophageal reflux disease) 03/13/2015  . Headache    migraines  . Heart murmur Dx 2015  . 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 Hypertension Dx [id number]  . Lumbar radicular pain 11/22/2014  . Pain disorder 08/29/2017  . Panic attack Dx 2006  . Positive depression screening 08/29/2017  . PTSD (post-traumatic stress disorder) Dx 2013  . Seizures ([place]) 2001  . Vitamin D deficiency 01/24/2015    Patient Active Problem List   Diagnosis Date Noted  . Iron deficiency anemia due to chronic blood loss 11/22/2018  . Pelvic pain in female 11/08/2018  . Hypertension 08/30/2018  . DUB (dysfunctional uterine bleeding) 06/17/2018  . Fibroid uterus 01/18/2018  . Cervical radiculopathy 01/01/2018  . Pain disorder 08/29/2017  . Neck pain without injury 08/24/2017  . Chronic fatigue 05/15/2017  . Common migraine  04/15/2017  . Axillary mass, right 04/09/2017  . History of bulimia nervosa 03/13/2015  . GERD (gastroesophageal reflux disease) 03/13/2015  . SOB (shortness of breath) 03/13/2015  . H/O vitamin D deficiency 03/13/2015  . Health care maintenance 01/25/2015  . Chronic tension type headache 01/25/2015  . Muscle pain, fibromyalgia 01/24/2015  . Allergic rhinitis 01/24/2015  . Lumbar radicular pain 11/22/2014  . Thoracic  neuralgia 11/22/2014  . Back pain   . Abdominal pain 06/06/2012  . Chronic pain 06/06/2012  . Depression 06/06/2012  . HEMOPTYSIS UNSPECIFIED 12/14/2009  . BREAST TENDERNESS 11/08/2009  . NIPPLE DISCHARGE 11/08/2009  . MENORRHAGIA 11/08/2009  . DISORDER, BIPOLAR NOS 08/06/2007  . PERSONALITY DISORDER 08/06/2007  . MARIJUANA ABUSE 08/06/2007  . NARCOTIC ABUSE 08/06/2007  . SEIZURE DISORDER 08/06/2007  . 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 BREAST BIOPSY Left   . ESOPHAGOGASTRODUODENOSCOPY N/A 08/14/2015   Procedure: ESOPHAGOGASTRODUODENOSCOPY (EGD);  Surgeon: Carol Ada, MD;  Location: [facility];  Service: Endoscopy;  Laterality: N/A;     OB History    Gravida  1   Para  0   Term  0   Preterm  0   AB  1   Living  0     SAB  1   TAB  0   Ectopic  0   Multiple  0   Live Births  0           Family History  Problem Relation Age of Onset  . Heart attack Mother   . Heart disease Mother   . Sudden death Mother   . Brain cancer Mother   . Sudden death Sister   . Heart attack Sister   . Sudden death Maternal Grandmother   . Fainting Maternal Grandmother   . Heart attack Maternal Grandmother   . Breast cancer Paternal Grandmother   . Breast cancer Cousin   . Breast cancer Paternal Aunt   . Seizures Maternal Aunt     Social History   Tobacco Use  . Smoking status: Former Smoker    Quit date: 06/23/2012    Years since quitting: 7.8  . Smokeless tobacco: Never Used  Substance Use Topics  . Alcohol use: Not Currently    Comment: rarely  . Drug use: No    Home Medications Prior to Admission medications   Medication Sig Start Date End Date Taking? Authorizing Provider  Multiple Vitamin (MULTIVITAMIN) tablet Take 1 tablet by mouth daily.   Yes [provider]  pantoprazole (PROTONIX) 40 MG tablet Take 40 mg by mouth 2 (two) times daily.  03/19/20  Yes [provider]    butalbital-acetaminophen-caffeine (FIORICET) 50-325-40 MG tablet Take 1-2 tablets by mouth every 6 (six) hours as needed for headache. 04/21/20 04/21/21  Rodell Perna A, PA-C  ondansetron (ZOFRAN ODT) 4 MG disintegrating tablet Take 1 tablet (4 mg total) by mouth every 8 (eight) hours as needed for nausea or vomiting. 04/21/20   Rodell Perna A, PA-C    Allergies    Aspirin, Other, Adhesive [tape], and Contrast media [iodinated diagnostic agents]  Review of Systems   Review of Systems  Constitutional: Positive for chills. Negative for fever.  Eyes: Positive for visual disturbance. Negative for photophobia.  Gastrointestinal: Positive for abdominal pain, nausea and vomiting.  Musculoskeletal: Positive for back pain.  Neurological: Positive for weakness (Generalized) and headaches.  All other systems reviewed and are negative.   Physical Exam Updated Vital  Signs BP 131/67   Pulse 62   Temp 98.2 F (36.8 C) (Oral)   Resp 16   Ht 5\' 10"  (1.778 m)   Wt 74.4 kg   LMP 04/04/2020   SpO2 100%   BMI 23.53 kg/m   Physical Exam Vitals and nursing note reviewed.  Constitutional:      General: She is not in acute distress.    Appearance: She is well-developed.     Comments: Appears uncomfortable  HENT:     Head: Normocephalic and atraumatic.     Comments: Some tenderness to palpation along the occiput.  No crepitus deformity or swelling noted. Eyes:     General:        Right eye: No discharge.        Left eye: No discharge.     Conjunctiva/sclera: Conjunctivae normal.  Neck:     Vascular: No JVD.     Trachea: No tracheal deviation.     Comments: No midline cervical spine tenderness or paracervical muscle tenderness. Cardiovascular:     Rate and Rhythm: Normal rate and regular rhythm.  Pulmonary:     Effort: Pulmonary effort is normal.     Breath sounds: Normal breath sounds.  Abdominal:     General: There is no distension.  Musculoskeletal:     Cervical back: Normal range of  motion and neck supple. No rigidity.     Comments: Diffuse midline lumbar tenderness to palpation.  Bilateral paralumbar muscle tenderness right worse than left.  Skin:    General: Skin is warm and dry.     Findings: No erythema.  Neurological:     Mental Status: She is alert and oriented to person, place, and time.     GCS: GCS eye subscore is 4. GCS verbal subscore is 5. GCS motor subscore is 6.     Comments: Speech is fluent and goal oriented with no evidence of dysarthria or aphasia.  Cranial nerves II through XII tested and grossly intact although she endorses altered sensation to light touch of the right side of the face.  5/5 strength of BUE and BLE major muscle groups.  Altered sensation to light touch of the right upper arm.  Otherwise sensation intact to light touch of extremities. Portions of examination are limited due to patient's discomfort.  Psychiatric:        Behavior: Behavior normal.     ED Results / Procedures / Treatments   Labs (all labs ordered are listed, but only abnormal results are displayed) Labs Reviewed  URINALYSIS, ROUTINE W REFLEX MICROSCOPIC - Abnormal; Notable for the following components:      Result Value   Ketones, ur 20 (*)    Protein, ur 30 (*)    All other components within normal limits  COMPREHENSIVE METABOLIC PANEL - Abnormal; Notable for the following components:   AST 14 (*)    All other components within normal limits  CBC WITH DIFFERENTIAL/PLATELET - Abnormal; Notable for the following components:   HCT 34.7 (*)    All other components within normal limits  LIPASE, BLOOD  I-STAT BETA HCG BLOOD, ED ([facility], [facility], [facility] ONLY)    EKG None  Radiology CT Head Wo Contrast  Result Date: 04/21/2020 CLINICAL DATA:  34 year old female with headache and neck pain. Recent lumbar puncture. EXAM: CT HEAD WITHOUT CONTRAST TECHNIQUE: Contiguous axial images were obtained from the base of the skull through the vertex without intravenous contrast.  COMPARISON:  02/24/2018 brain MR and prior studies FINDINGS:  Brain: No evidence of acute infarction, hemorrhage, hydrocephalus, extra-axial collection or mass lesion/mass effect. Vascular: No hyperdense vessel or unexpected calcification. Skull: Normal. Negative for fracture or focal lesion. Sinuses/Orbits: No acute finding. Other: None. IMPRESSION: Unremarkable noncontrast head CT. Electronically Signed   By: Margarette Canada M.D.   On: 04/21/2020 13:36   CT Lumbar Spine Wo Contrast  Result Date: 04/21/2020 CLINICAL DATA:  Severe low back pain. Recent lumbar puncture. Headache. EXAM: CT LUMBAR SPINE WITHOUT CONTRAST TECHNIQUE: Multidetector CT imaging of the lumbar spine was performed without intravenous contrast administration. Multiplanar CT image reconstructions were also generated. COMPARISON:  Lumbar radiographs dated 12/07/2018 and lumbar MRI dated 04/14/2018 FINDINGS: Segmentation: 5 lumbar type vertebra. Alignment: Physiologic. Vertebrae: No acute fracture or focal pathologic process. Paraspinal and other soft tissues: Negative. Disc levels: T12-L1: Normal. L1-2: Normal. L2-3: Normal. L3-4: Normal. L4-5: Normal. L5-S1: Normal disc. Minimal degenerative changes of the right facet joint. No evidence of epidural hematoma or other mass effect upon the thecal sac in the lumbar spine. IMPRESSION: 1. Minimal degenerative changes of the right facet joint at L5-S1. 2. Otherwise, normal CT scan of the lumbar spine. Electronically Signed   By: Lorriane Shire M.D.   On: 04/21/2020 13:37    Procedures Procedures (including critical care time)  Medications Ordered in ED Medications  prochlorperazine (COMPAZINE) injection 10 mg (10 mg Intravenous Given 04/21/20 1248)  diphenhydrAMINE (BENADRYL) injection 25 mg (25 mg Intravenous Given 04/21/20 1249)  sodium chloride 0.9 % bolus 1,000 mL (0 mLs Intravenous Stopped 04/21/20 1411)  caffeine-sodium benzoate ADULT 500 mg in sodium chloride 0.9 % 1,000 mL IVPB (0 mg  Intravenous Stopped 04/21/20 1624)  ketorolac (TORADOL) 30 MG/ML injection 30 mg (30 mg Intravenous Given 04/21/20 1434)    ED Course  I have reviewed the triage vital signs and the nursing notes.  Pertinent labs & imaging results that were available during my care of the patient were reviewed by me and considered in my medical decision making (see chart for details).    MDM Rules/Calculators/A&P                      Patient presenting for evaluation of headache and low back pain for 3 days after lumbar puncture on 04/18/2020.  She is afebrile, vital signs are stable.  She appears quite uncomfortable and portions of initial examination are limited due to her discomfort but she exhibits no focal neurologic deficits.  She is complaining of neck pain but has no midline cervical spine tenderness on examination and has no limitation in range of motion.  No nuchal rigidity or meningeal signs identified.  In the absence of leukocytosis or fever today I have a low suspicion of meningitis specifically bacterial meningitis.  Lab work reviewed and interpreted by myself shows no leukocytosis, no anemia, no metabolic derangements, no renal insufficiency.  UA does not suggest UTI or nephrolithiasis but is consistent with dehydration.  She is able to provide a urine sample despite the pain in her back.  Imaging shows no evidence of acute intracranial abnormality, including subarachnoid hemorrhage, mass, skull fracture.  CT lumbar spine shows minimal degenerative changes but no evidence of fluid collection or significant stenosis.  Patient was given IV fluids, Compazine, Benadryl, caffeine and Toradol in the ED and on reevaluation reports that she is feeling much better.  She is ambulatory in the ED without difficulty.  At this time I have a low suspicion of cauda equina or spinal abscess.  Doubt CVA.  Suspect that she is likely experiencing sequela of recent lumbar puncture.  Per chart review using care everywhere LP  was not indicative of neoplastic disease.  She has close follow-up with her neurologist.  She will call today to schedule follow-up and for further recommendations.  Will discharge with Zofran and Fioricet.  Discussed strict ED return precautions.  Patient and her significant other verbalized understanding of and agreement with plan and patient is stable for discharge at this time.  Patient was seen and evaluated by Dr. Kathrynn Humble who agrees with assessment and plan at this time.    Final Clinical Impression(s) / ED Diagnoses Final diagnoses:  Lumbar puncture less than one week ago  Bad headache    Rx / DC Orders ED Discharge Orders         Ordered    ondansetron (ZOFRAN ODT) 4 MG disintegrating tablet  Every 8 hours PRN     04/21/20 1651    butalbital-acetaminophen-caffeine (FIORICET) 50-325-40 MG tablet  Every 6 hours PRN     04/21/20 1651           Alashia Brownfield, [place] A, PA-C 04/21/20 2125    Varney Biles, MD 04/22/20 1300

## 2020-04-21 NOTE — ED Triage Notes (Signed)
Patient states she had a spinal tap the other day and has been in and out of the hospital daily since for headache. Patients says her neck is stiff, hurts to urinate and legs hurt. Pain rated 10/10.

## 2020-04-21 NOTE — Discharge Instructions (Addendum)
Take Fioricet as prescribed.  Do not take more than 6 tablets in 24 hours.  You can take Zofran as needed for nausea and vomiting.  Let this medicine dissolve under your tongue and wait around 10 or 15 minutes before you have anything to eat or drink to get this medicine time to work. I would recommend eating and drinking small meals throughout the day as opposed to 1 or 2 large meals throughout the day.  Please call your neurologist and schedule close follow-up for reevaluation of symptoms.  Return to the emergency department if any concerning signs or symptoms develop such as fevers, vomiting, weakness to 1 side of the body, loss of consciousness or other progressive or worsening symptoms.

## 2020-04-21 NOTE — ED Notes (Signed)
Paitent ambulated to RR and back without assistance, states she feels much better.

## 2020-07-23 ENCOUNTER — Encounter (HOSPITAL_COMMUNITY): Payer: Self-pay

## 2020-07-23 ENCOUNTER — Emergency Department (HOSPITAL_COMMUNITY)
Admission: EM | Admit: 2020-07-23 | Discharge: 2020-07-23 | Disposition: A | Discharge status: Home / Self Care | Payer: Self-pay | Attending: Emergency Medicine | Admitting: Emergency Medicine

## 2020-07-23 ENCOUNTER — Other Ambulatory Visit: Payer: Self-pay

## 2020-07-23 DIAGNOSIS — R12 Heartburn: Secondary | ICD-10-CM | POA: Insufficient documentation

## 2020-07-23 DIAGNOSIS — R140 Abdominal distension (gaseous): Secondary | ICD-10-CM | POA: Insufficient documentation

## 2020-07-23 DIAGNOSIS — Z5321 Procedure and treatment not carried out due to patient leaving prior to being seen by health care provider: Secondary | ICD-10-CM | POA: Insufficient documentation

## 2020-07-23 NOTE — ED Triage Notes (Signed)
Patient states she had an EGD done today and is having increased heartburn, abdominal bloating and refux. Patient states she has a tube that measures her acid levels and pressure in her right nare. Patient called the GI physician and was told to come to the ED to have the tube removed.

## 2020-08-11 ENCOUNTER — Other Ambulatory Visit: Payer: Self-pay

## 2020-08-11 ENCOUNTER — Emergency Department (HOSPITAL_COMMUNITY)
Admission: EM | Admit: 2020-08-11 | Discharge: 2020-08-11 | Disposition: A | Discharge status: Home / Self Care | Payer: Self-pay | Attending: Emergency Medicine | Admitting: Emergency Medicine

## 2020-08-11 ENCOUNTER — Encounter (HOSPITAL_COMMUNITY): Payer: Self-pay

## 2020-08-11 ENCOUNTER — Emergency Department (HOSPITAL_COMMUNITY): Payer: Self-pay

## 2020-08-11 DIAGNOSIS — S61211A Laceration without foreign body of left index finger without damage to nail, initial encounter: Secondary | ICD-10-CM | POA: Insufficient documentation

## 2020-08-11 DIAGNOSIS — I10 Essential (primary) hypertension: Secondary | ICD-10-CM | POA: Insufficient documentation

## 2020-08-11 DIAGNOSIS — R000 Tachycardia, unspecified: Secondary | ICD-10-CM | POA: Insufficient documentation

## 2020-08-11 DIAGNOSIS — W260XXA Contact with knife, initial encounter: Secondary | ICD-10-CM | POA: Insufficient documentation

## 2020-08-11 DIAGNOSIS — Z87891 Personal history of nicotine dependence: Secondary | ICD-10-CM | POA: Insufficient documentation

## 2020-08-11 DIAGNOSIS — Z23 Encounter for immunization: Secondary | ICD-10-CM | POA: Insufficient documentation

## 2020-08-11 DIAGNOSIS — S61209A Unspecified open wound of unspecified finger without damage to nail, initial encounter: Secondary | ICD-10-CM

## 2020-08-11 MED ORDER — LIDOCAINE HCL (PF) 1 % IJ SOLN
5.0000 mL | Freq: Once | INTRAMUSCULAR | Status: DC
  Filled 2020-08-11: qty 30

## 2020-08-11 MED ORDER — LIDOCAINE-EPINEPHRINE-TETRACAINE (LET) TOPICAL GEL
3.0000 mL | Freq: Once | TOPICAL | Status: AC
  Administered 2020-08-11: 3 mL via TOPICAL
  Filled 2020-08-11: qty 3

## 2020-08-11 MED ORDER — TETANUS-DIPHTH-ACELL PERTUSSIS 5-2.5-18.5 LF-MCG/0.5 IM SUSP
0.5000 mL | Freq: Once | INTRAMUSCULAR | Status: AC
  Administered 2020-08-11: 0.5 mL via INTRAMUSCULAR
  Filled 2020-08-11: qty 0.5

## 2020-08-11 MED ORDER — HYDROCODONE-ACETAMINOPHEN 5-325 MG PO TABS
2.0000 | ORAL_TABLET | Freq: Once | ORAL | Status: AC
  Administered 2020-08-11: 2 via ORAL
  Filled 2020-08-11: qty 2

## 2020-08-11 NOTE — Discharge Instructions (Addendum)
1. Medications: Tylenol or ibuprofen for pain, usual home medications 2. Treatment: ice for swelling, keep wound clean with warm soap and water and keep bandage dry, do not submerge in water for 24 hours 3. Follow Up: Please see your PCP in 3-5 days for a wound check. Return to the emergency department for increased redness, drainage of pus from the wound   WOUND CARE  Keep area clean and dry for 24 hours. Do not remove bandage, if applied.  After 24 hours, remove bandage and wash wound gently with mild soap and warm water. Reapply a new bandage after cleaning wound, if directed.   Continue daily cleansing with soap and water until stitches/staples are removed.  Return if you experience any of the following signs of infection: Swelling, redness, pus drainage, streaking, fever >101.0 F  Return if you experience excessive bleeding that does not stop after 15-20 minutes of constant, firm pressure.
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
## 2020-08-11 NOTE — ED Triage Notes (Signed)
Pt states cooking in the kitchen and sliced part of left second digit off. Bleeding controlled.

## 2021-01-04 ENCOUNTER — Other Ambulatory Visit: Payer: Self-pay

## 2021-01-04 ENCOUNTER — Ambulatory Visit (HOSPITAL_COMMUNITY)
Admission: EM | Admit: 2021-01-04 | Discharge: 2021-01-04 | Disposition: A | Discharge status: Home / Self Care | Payer: No Payment, Other | Attending: Psychiatry | Admitting: Psychiatry

## 2021-01-04 DIAGNOSIS — Z560 Unemployment, unspecified: Secondary | ICD-10-CM | POA: Diagnosis not present

## 2021-01-04 DIAGNOSIS — Z5902 Unsheltered homelessness: Secondary | ICD-10-CM | POA: Insufficient documentation

## 2021-01-04 DIAGNOSIS — F4310 Post-traumatic stress disorder, unspecified: Secondary | ICD-10-CM | POA: Insufficient documentation

## 2021-01-04 DIAGNOSIS — Z9151 Personal history of suicidal behavior: Secondary | ICD-10-CM | POA: Insufficient documentation

## 2021-01-04 DIAGNOSIS — F331 Major depressive disorder, recurrent, moderate: Secondary | ICD-10-CM | POA: Insufficient documentation

## 2021-01-04 NOTE — Progress Notes (Signed)
[name] received her AVS, questions answered and personal belongings retrieved.

## 2021-01-04 NOTE — Discharge Instructions (Addendum)
Please come to [facility] (this facility) during walk in hours for appointment with psychiatrist for further medication management and for therapy.   Walk in hours are 8-11 AM Monday through Thursday for medication management.It is first come, first -serve; it is best to arrive by 7:00 AM. On Friday from 1 pm to 4 pm for therapy intake only. Please arrive by 12:00 pm as it is  first come, first -serve.   When you arrive please go upstairs for your appointment. If you are unsure of where to go, inform the front desk that you are here for a walk in appointment and they will assist you with directions upstairs.  Address:  [address], in [place], [place] Ph: [phone]

## 2021-01-04 NOTE — Discharge Planning (Signed)
[name] to be D/C'd Home per MD order.  Discussed with the patient and all questions fully answered.  IV catheter discontinued intact. Site without signs and symptoms of complications. Dressing and pressure applied.  An After Visit Summary was printed and given to the patient. Patient received follow up instructions for outpatient services.  D/c education completed with patient/family including follow up instructions, medication list, d/c activities limitations if indicated, with other d/c instructions as indicated by MD - patient able to verbalize understanding, all questions fully answered.   Patient instructed to return to ED, call 911, or call MD for any changes in condition.   Patient escorted via [facility], and D/C home via private auto.  [name] 01/04/2021 4:58 PM
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
## 2021-01-11 ENCOUNTER — Telehealth (HOSPITAL_COMMUNITY): Payer: Self-pay | Admitting: Family Medicine

## 2021-01-11 NOTE — Telephone Encounter (Signed)
Care Management - Follow Up [facility] Discharges   Writer attempted to make contact with patient today and was unsuccessful.  Writer was able to leave a HIPPA compliant voice message and will await callback.   

## 2021-02-27 ENCOUNTER — Ambulatory Visit (HOSPITAL_COMMUNITY): Payer: No Payment, Other | Admitting: Family

## 2021-02-27 ENCOUNTER — Ambulatory Visit (HOSPITAL_COMMUNITY): Payer: No Payment, Other | Admitting: Physician Assistant

## 2021-05-02 ENCOUNTER — Other Ambulatory Visit: Payer: Self-pay

## 2021-05-02 ENCOUNTER — Encounter (HOSPITAL_COMMUNITY): Payer: Self-pay | Admitting: Physician Assistant

## 2021-05-02 ENCOUNTER — Ambulatory Visit (INDEPENDENT_AMBULATORY_CARE_PROVIDER_SITE_OTHER): Payer: No Payment, Other | Admitting: Physician Assistant

## 2021-05-02 ENCOUNTER — Encounter: Payer: Self-pay | Admitting: Hematology and Oncology

## 2021-05-02 VITALS — BP 118/70 | HR 104 | Ht 70.0 in | Wt 156.0 lb

## 2021-05-02 DIAGNOSIS — F332 Major depressive disorder, recurrent severe without psychotic features: Secondary | ICD-10-CM | POA: Diagnosis not present

## 2021-05-02 DIAGNOSIS — F4310 Post-traumatic stress disorder, unspecified: Secondary | ICD-10-CM | POA: Diagnosis not present

## 2021-05-02 DIAGNOSIS — F411 Generalized anxiety disorder: Secondary | ICD-10-CM | POA: Diagnosis not present

## 2021-05-02 MED ORDER — GABAPENTIN 100 MG PO CAPS
100.0000 mg | ORAL_CAPSULE | Freq: Three times a day (TID) | ORAL | 1 refills | Status: DC
  Filled 2021-05-02: qty 90, 30d supply, fill #0

## 2021-05-02 MED ORDER — VENLAFAXINE HCL ER 37.5 MG PO CP24
37.5000 mg | ORAL_CAPSULE | Freq: Every day | ORAL | 1 refills | Status: DC
  Filled 2021-05-02: qty 30, 30d supply, fill #0
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
## 2021-06-11 ENCOUNTER — Other Ambulatory Visit: Payer: Self-pay

## 2021-06-11 ENCOUNTER — Ambulatory Visit (INDEPENDENT_AMBULATORY_CARE_PROVIDER_SITE_OTHER): Payer: No Payment, Other | Admitting: Physician Assistant

## 2021-06-11 ENCOUNTER — Encounter: Payer: Self-pay | Admitting: Hematology and Oncology

## 2021-06-11 ENCOUNTER — Encounter (HOSPITAL_COMMUNITY): Payer: Self-pay | Admitting: Physician Assistant

## 2021-06-11 VITALS — BP 125/77 | HR 93 | Ht 70.0 in | Wt 167.0 lb

## 2021-06-11 DIAGNOSIS — F4310 Post-traumatic stress disorder, unspecified: Secondary | ICD-10-CM | POA: Diagnosis not present

## 2021-06-11 DIAGNOSIS — F332 Major depressive disorder, recurrent severe without psychotic features: Secondary | ICD-10-CM | POA: Diagnosis not present

## 2021-06-11 DIAGNOSIS — F411 Generalized anxiety disorder: Secondary | ICD-10-CM | POA: Insufficient documentation

## 2021-06-11 MED ORDER — DESVENLAFAXINE SUCCINATE ER 25 MG PO TB24
25.0000 mg | ORAL_TABLET | Freq: Every day | ORAL | 1 refills | Status: DC
  Filled 2021-06-11: qty 30, 30d supply, fill #0

## 2021-06-11 MED ORDER — GABAPENTIN 100 MG PO CAPS
100.0000 mg | ORAL_CAPSULE | Freq: Three times a day (TID) | ORAL | 1 refills | Status: DC
  Filled 2021-06-11 – 2021-08-13 (×2): qty 90, 30d supply, fill #0

## 2021-06-11 NOTE — Progress Notes (Signed)
BH MD/PA/NP OP Progress Note  06/11/2021 8:41 AM [name]  MRN:  [id number]  Chief Complaint:  Chief Complaint   Medication Management    HPI:   [name]. Zale is a 35 year old female with a past psychiatric history significant for major depressive disorder, generalized anxiety disorder, and PTSD who presents to [facility] for follow-up and medication management.  Patient is currently being managed on the following medications:  Venlafaxine XR 37.5 mg 24-hour capsule daily Gabapentin 100 mg 3 times daily  Patient reports that her use of gabapentin has been going well, however, she reports that her venlafaxine has been causing her GI upset.  Patient's GI upset is characterized by stomach pains as well as vomiting.  Patient is interested in being placed on a medication that she is able to tolerate and does not cause GI upset.  Patient has been on the following medications in the past: Zoloft, Lexapro, Paxil, Seroquel, Effexor, and Wellbutrin.  Patient endorses more energy and a sense of calmness since being placed on gabapentin.  She reports that she is still overwhelmed with many stressors in her life.  Patient states that she recently transitioned from living in her car and has been living with friends occasionally.  Patient reports that she is trying to remain positive while maintaining a high vibration, although, she expresses that some days are more difficult than others.  A PHQ-9 screen was performed with the patient scoring a 21.  A GAD-7 screen was also performed with the patient scoring a 21.  Patient is calm, cooperative, and fully engaged in conversation during the encounter.  Patient endorses feeling a little sad today.  She denies suicidal or homicidal ideations.  She further denies auditory or visual hallucinations and does not appear to be responding to internal/external stimuli.  Patient endorses poor sleep and states that she has  been sleeping most of the day.  Patient endorses poor appetite and states that she has 1 meal a day.  Patient reports that she has recently been consuming mainly starch due to the nutritional value and it is easier on her stomach.  Patient denies alcohol use, tobacco use, and illicit drug use.  Visit Diagnosis:    ICD-10-CM   1. Severe episode of recurrent major depressive disorder, without psychotic features ([place])  F33.2 Desvenlafaxine Succinate ER (PRISTIQ) 25 MG TB24    2. Generalized anxiety disorder  F41.1 Desvenlafaxine Succinate ER (PRISTIQ) 25 MG TB24    gabapentin (NEURONTIN) 100 MG capsule    3. 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   Hypertension Dx 20015   Lumbar radicular pain 11/22/2014   Pain disorder 08/29/2017   Panic attack Dx 2006   Positive depression screening 08/29/2017   PTSD (post-traumatic stress disorder) Dx 2013   Seizures ([place]) 2001   Vitamin D deficiency 01/24/2015    Past Surgical  History:  Procedure Laterality Date   BREAST BIOPSY Left    ESOPHAGOGASTRODUODENOSCOPY N/A 08/14/2015   Procedure: ESOPHAGOGASTRODUODENOSCOPY (EGD);  Surgeon: Carol Ada, MD;  Location: [facility];  Service: Endoscopy;  Laterality: N/A;    Family Psychiatric History:  Patient is unsure of family psychiatric history since most of her immediate family is deceased  Family History:  Family History  Problem Relation Age of Onset   Heart  attack Mother    Heart disease Mother    Sudden death Mother    Brain cancer Mother    Sudden death Sister    Heart attack Sister    Sudden death Maternal Grandmother    Fainting Maternal Grandmother    Heart attack Maternal Grandmother    Breast cancer Paternal Grandmother    Breast cancer Cousin    Breast cancer Paternal Aunt    Seizures Maternal Aunt     Social History:  Social History   Socioeconomic History   Marital status: Single    Spouse name: Not on file   Number of children: Not on file   Years of education: 12   Highest education level: Not on file  Occupational History   Not on file  Tobacco Use   Smoking status: Former    Types: Cigarettes    Quit date: 06/23/2012    Years since quitting: 8.9   Smokeless tobacco: Never  Vaping Use   Vaping Use: Never used  Substance and Sexual Activity   Alcohol use: Never   Drug use: No   Sexual activity: Not Currently    Birth control/protection: None    Comment: last 2010  Other Topics Concern   Not on file  Social History Narrative   Patient is single, currently raising her nephews   Patient is right handed   Patient's education level is high school graduate   Patient doesn't drink caffeine         02/05/18:   Pt lives in 2 story home with her friend   States she has 55 years of college   Worked as CSR for [employer] Determinants of Radio broadcast assistant Strain: Not on file  Food Insecurity: Not on file  Transportation Needs: Not on file  Physical Activity: Not on file  Stress: Not on file  Social Connections: Not on file    Allergies:  Allergies  Allergen Reactions   Aspirin Hives   Other Itching and Rash    iodine   Adhesive [Tape]     Skin irritation   Contrast Media [Iodinated Diagnostic Agents] Hives    Metabolic Disorder Labs: No results found for: HGBA1C, MPG Lab Results  Component Value Date   PROLACTIN 6.8 11/08/2009   PROLACTIN  01/06/2009    14.8 (NOTE)      Reference Ranges:                 Female:                       2.1 -  17.1 ng/ml                 Female:   Pregnant          9.7 - 208.5 ng/mL                           Non Pregnant      2.8 -  29.2  ng/mL                           Post  Menopausal   1.8 -  20.3 ng/mL                     Lab Results  Component Value Date   CHOL 109 06/07/2012   TRIG 61 06/07/2012   HDL 38 (L) 06/07/2012   CHOLHDL 2.9 06/07/2012   VLDL 12 06/07/2012   LDLCALC 59 06/07/2012   Lab Results  Component Value Date   TSH 1.120 04/28/2018   TSH 0.991 08/19/2017    Therapeutic Level Labs: No results found for: LITHIUM No results found for: VALPROATE No components found for:  CBMZ  Current Medications: Current Outpatient Medications  Medication Sig Dispense Refill   busPIRone (BUSPAR) 7.5 MG tablet Take 1 tablet by mouth daily.     Desvenlafaxine Succinate ER (PRISTIQ) 25 MG TB24 Take 25 mg by mouth daily. 30 tablet 1   famotidine (PEPCID) 40 MG tablet Take 40 mg by mouth 2 (two) times daily.     lidocaine (XYLOCAINE) 5 % ointment Apply 1 application topically every evening.     Multiple Vitamin (MULTIVITAMIN) tablet Take 1 tablet by mouth daily.     ondansetron (ZOFRAN ODT) 4 MG disintegrating tablet Take 1 tablet (4 mg total) by mouth every 8 (eight) hours as needed for nausea or vomiting. 10 tablet 0   RABEprazole (ACIPHEX) 20 MG tablet Take 1 tablet by mouth daily as needed.     gabapentin (NEURONTIN) 100 MG capsule Take 1 capsule (100 mg total) by mouth 3 (three) times daily. 90 capsule 1   No current facility-administered medications for this visit.     Musculoskeletal: Strength & Muscle Tone: abnormal, during the last encounter, patient stated that she has been experiencing muscle loss and general weakness in her right arm. Gait & Station: unsteady, patient ambulates with the use of a cane Patient leans: N/A  Psychiatric Specialty Exam: Review of Systems  Psychiatric/Behavioral:  Positive for  sleep disturbance. Negative for decreased concentration, dysphoric mood, hallucinations, self-injury and suicidal ideas. The patient is nervous/anxious. The patient is not hyperactive.    Blood pressure 125/77, pulse 93, height 5\' 10"  (1.778 m), weight 167 lb (75.8 kg).Body mass index is 23.96 kg/m.  General Appearance: Well Groomed  Eye Contact:  Good  Speech:  Clear and Coherent and Normal Rate  Volume:  Normal  Mood:  Anxious and Depressed  Affect:  Congruent and Depressed  Thought Process:  Coherent, Goal Directed, and Descriptions of Associations: Intact  Orientation:  Full (Time, Place, and Person)  Thought Content: WDL   Suicidal Thoughts:  No  Homicidal Thoughts:  No  Memory:  Immediate;   Good Recent;   Good Remote;   Good  Judgement:  Good  Insight:  Good  Psychomotor Activity:  Restlessness  Concentration:  Concentration: Good and Attention Span: Good  Recall:  NA  Fund of Knowledge: Good  Language: Good  Akathisia:  NA  Handed:  Right  AIMS (if indicated): not done  Assets:  Communication Skills Desire for Improvement Housing  ADL's:  Intact  Cognition: WNL  Sleep:  Poor   Screenings: GAD-7    Flowsheet Row Clinical Support from 06/11/2021 in [facility] Office Visit from 05/02/2021 in [facility] Office Visit from 10/18/2018 in [place] for [facility] Office Visit  from 07/08/2018 in [place] Office Visit from 05/25/2018 in [place]  Total GAD-7 Score 21 13 0 14 7      PHQ2-9    Flowsheet Row Clinical Support from 06/11/2021 in [facility] Office Visit from 05/02/2021 in [facility] ED from 01/04/2021 in [facility] Office Visit from 10/18/2018 in [place] for [facility] Office Visit from 10/01/2018 in [place] at  [place]  PHQ-2 Total Score 5 6 2  0 3  PHQ-9 Total Score 21 23 9  0 13      Flowsheet Row Clinical Support from 06/11/2021 in [facility] Office Visit from 05/02/2021 in [facility] ED from 01/04/2021 in [place] CATEGORY Low Risk Low Risk Low Risk        Assessment and Plan:   Lianny Molter. Whitmore is a 35 year old female with a past psychiatric history significant for major depressive disorder, generalized anxiety disorder, and PTSD who presents to [facility] for follow-up and medication management.  Patient is interested in being placed on a new antidepressant due to her venlafaxine causing GI upset.  Patient was recommended desvenlafaxine succinate ER 25 mg daily.  Patient was agreeable to recommendation.  Patient's medications to be e-prescribed to pharmacy of choice.  1. Generalized anxiety disorder  - Desvenlafaxine Succinate ER (PRISTIQ) 25 MG TB24; Take 25 mg by mouth daily.  Dispense: 30 tablet; Refill: 1 - gabapentin (NEURONTIN) 100 MG capsule; Take 1 capsule (100 mg total) by mouth 3 (three) times daily.  Dispense: 90 capsule; Refill: 1  2. Severe episode of recurrent major depressive disorder, without psychotic features ([place])  - Desvenlafaxine Succinate ER (PRISTIQ) 25 MG TB24; Take 25 mg by mouth daily.  Dispense: 30 tablet; Refill: 1  3. PTSD (post-traumatic stress disorder)  - Desvenlafaxine Succinate ER (PRISTIQ) 25 MG TB24; Take 25 mg by mouth daily.  Dispense: 30 tablet; Refill: 1  Patient to follow-up in 7 weeks Provider spent a total of 20 minutes with the patient/reviewing patient's chart  Malachy Mood, PA 06/11/2021, 8:41 AM

## 2021-06-12 ENCOUNTER — Other Ambulatory Visit: Payer: Self-pay

## 2021-06-18 ENCOUNTER — Other Ambulatory Visit: Payer: Self-pay

## 2021-06-19 ENCOUNTER — Other Ambulatory Visit: Payer: Self-pay

## 2021-06-28 ENCOUNTER — Ambulatory Visit (HOSPITAL_COMMUNITY): Payer: No Payment, Other | Admitting: Clinical

## 2021-07-04 ENCOUNTER — Ambulatory Visit (INDEPENDENT_AMBULATORY_CARE_PROVIDER_SITE_OTHER): Payer: No Payment, Other | Admitting: Clinical

## 2021-07-04 ENCOUNTER — Other Ambulatory Visit: Payer: Self-pay

## 2021-07-04 DIAGNOSIS — F332 Major depressive disorder, recurrent severe without psychotic features: Secondary | ICD-10-CM

## 2021-07-07 NOTE — Progress Notes (Signed)
   THERAPIST PROGRESS NOTE  Session Time: 30 minutes  Participation Level: Active  Behavioral Response: CasualAlertDepressed  Type of Therapy: Individual Therapy  Treatment Goals addressed: Coping  Interventions: CBT and Supportive  Summary:  [name] is a 35 y.o. female who presents for the scheduled session oriented x5, appropriately dressed, and friendly.  Client denied hallucinations and delusions. Client presents for initial appointment with this therapist.  Client reported it has been over a year since she has attended therapy.  Client reported she previously worked with a therapist through [facility] but the sessions abruptly ended for the reason she is unsure of.  Client reported she worked with previous therapist on stress management.  Client is currently working with a [facility] psychiatrist for medication management.  Client reported struggles with depression and anxiety daily.  Client stated "I have a lot of trauma".  Client reported she wants to be able to work through that.  Client reported she has no family support.  Client reported the relationship with her 2 sisters is strained.  Client reported she helped raise her 2 nephews for 10 years to help with her sisters but they were taken away from her which is added to her emotional distress.  Client reported she has been living with a friend for the past few months but is anticipating that she will need to look for someone else to live within the next few weeks.  Client reported she is not feeling well, with her friend anymore.  Client reported prior to her health declining she regrets not being more social and creating a life for herself outside of taking care of her family.  Client reported she has been trying for disability related to her physical health diagnoses since 2019 without success.  Client reported she struggles with muscle spasms, contractions in her uterus, muscle weakness, vision loss which makes her scared to  drive.     Suicidal/Homicidal: Nowithout intent/plan  Therapist Response:  Therapist began the session making introductions and discussing confidentiality. Therapist used CBT to engage with client to ask about her history of mental health symptoms. Therapist used CBT to engage with the client to ask open-ended and clarifying questions about ongoing stressors that affect the client symptoms. Therapist used CBT to utilize active listening and positive emotional support towards her thoughts and feelings. Therapist completed client's treatment plan with her input.  Therapist assigned the client homework to think about her desired outcome for therapy. Client was scheduled for next appointment.    Plan: Return again in 5 weeks.  Therapist provided the client information to [facility] community health and wellness to be established with a primary care doctor and apply for orange card.  Diagnosis: Severe episode of recurrent major depressive disorder, without psychotic features  Birdena Jubilee Wynter Isaacs, LCSW 07/04/2021

## 2021-08-01 ENCOUNTER — Encounter (HOSPITAL_COMMUNITY): Payer: No Payment, Other | Admitting: Physician Assistant

## 2021-08-13 ENCOUNTER — Encounter: Payer: Self-pay | Admitting: Hematology and Oncology

## 2021-08-13 ENCOUNTER — Other Ambulatory Visit: Payer: Self-pay

## 2021-08-26 ENCOUNTER — Ambulatory Visit (HOSPITAL_COMMUNITY): Payer: No Payment, Other | Admitting: Clinical

## 2021-09-12 ENCOUNTER — Encounter (HOSPITAL_COMMUNITY): Payer: No Payment, Other | Admitting: Physician Assistant

## 2021-09-19 ENCOUNTER — Ambulatory Visit (HOSPITAL_COMMUNITY): Payer: No Payment, Other | Admitting: Clinical

## 2021-10-02 ENCOUNTER — Encounter (HOSPITAL_COMMUNITY): Payer: Self-pay

## 2021-10-02 ENCOUNTER — Emergency Department (HOSPITAL_COMMUNITY): Payer: Self-pay

## 2021-10-02 ENCOUNTER — Emergency Department (HOSPITAL_BASED_OUTPATIENT_CLINIC_OR_DEPARTMENT_OTHER)
Admit: 2021-10-02 | Discharge: 2021-10-02 | Disposition: A | Discharge status: Home / Self Care | Payer: Self-pay | Attending: Emergency Medicine | Admitting: Emergency Medicine

## 2021-10-02 ENCOUNTER — Emergency Department (HOSPITAL_COMMUNITY)
Admission: EM | Admit: 2021-10-02 | Discharge: 2021-10-02 | Discharge status: Against Medical Advice | Payer: Self-pay | Attending: Emergency Medicine | Admitting: Emergency Medicine

## 2021-10-02 DIAGNOSIS — M7989 Other specified soft tissue disorders: Secondary | ICD-10-CM | POA: Insufficient documentation

## 2021-10-02 DIAGNOSIS — R531 Weakness: Secondary | ICD-10-CM | POA: Insufficient documentation

## 2021-10-02 DIAGNOSIS — R202 Paresthesia of skin: Secondary | ICD-10-CM | POA: Insufficient documentation

## 2021-10-02 DIAGNOSIS — R609 Edema, unspecified: Secondary | ICD-10-CM

## 2021-10-02 DIAGNOSIS — Z5321 Procedure and treatment not carried out due to patient leaving prior to being seen by health care provider: Secondary | ICD-10-CM | POA: Insufficient documentation

## 2021-10-02 LAB — BASIC METABOLIC PANEL
Anion gap: 8 (ref 5–15)
BUN: 11 mg/dL (ref 6–20)
CO2: 28 mmol/L (ref 22–32)
Calcium: 9 mg/dL (ref 8.9–10.3)
Chloride: 103 mmol/L (ref 98–111)
Creatinine, Ser: 0.62 mg/dL (ref 0.44–1.00)
GFR, Estimated: >60 mL/min (ref >60)
Glucose, Bld: 112 mg/dL — ABNORMAL HIGH (ref 70–99)
Potassium: 3.3 mmol/L — ABNORMAL LOW (ref 3.5–5.1)
Sodium: 139 mmol/L (ref 135–145)

## 2021-10-02 LAB — CBC WITH DIFFERENTIAL/PLATELET
Abs Immature Granulocytes: 0.01 10*3/uL (ref 0.00–0.07)
Basophils Absolute: 0 10*3/uL (ref 0.0–0.1)
Basophils Relative: 0 %
Eosinophils Absolute: 0 10*3/uL (ref 0.0–0.5)
Eosinophils Relative: 0 %
HCT: 35 % — ABNORMAL LOW (ref 36.0–46.0)
Hemoglobin: 12.2 g/dL (ref 12.0–15.0)
Immature Granulocytes: 0 %
Lymphocytes Relative: 27 %
Lymphs Abs: 1 10*3/uL (ref 0.7–4.0)
MCH: 30 pg (ref 26.0–34.0)
MCHC: 34.9 g/dL (ref 30.0–36.0)
MCV: 86.2 fL (ref 80.0–100.0)
Monocytes Absolute: 0.2 10*3/uL (ref 0.1–1.0)
Monocytes Relative: 6 %
Neutro Abs: 2.4 10*3/uL (ref 1.7–7.7)
Neutrophils Relative %: 67 %
Platelets: 192 10*3/uL (ref 150–400)
RBC: 4.06 MIL/uL (ref 3.87–5.11)
RDW: 13.2 % (ref 11.5–15.5)
WBC: 3.6 10*3/uL — ABNORMAL LOW (ref 4.0–10.5)
nRBC: 0 % (ref 0.0–0.2)

## 2021-10-02 LAB — PROTIME-INR
INR: 0.9 (ref 0.8–1.2)
Prothrombin Time: 12.4 seconds (ref 11.4–15.2)

## 2021-10-02 LAB — CK: Total CK: 213 U/L (ref 38–234)

## 2021-10-02 NOTE — ED Triage Notes (Signed)
Pt arrived via POV, c/o left sided neck and arm 'pins and needles" and left sided leg pain and lessened ability to use leg.

## 2021-10-02 NOTE — ED Provider Notes (Signed)
Emergency Medicine Provider Triage Evaluation Note  [name] , [name] 35 y.o. female  was evaluated in triage.  Pt complains of multiple, points.  Patient states over the last 24 hours she has had pain, swelling to her left leg.  She also feels like she has shooting pain in her leg.  She feels like something is moving and feels like she has [name] "blood clot."  She denies any prior history of such.  She has no chest pain, shortness of breath.  States she is unable to lay on her leg due to pain.  She has pain from her toe all the way to her hip.  She has no midline back pain.  Patient states she feels like she is having [name] stroke.  Intermittently over the last week she will have pain, weakness and shooting pain to her left upper extremity.  States this occurred for [name] few seconds and self resolved.  No sudden onset headache, difficulty speaking.  Patient walks with [name] cane at baseline.  States [name] few years ago she woke up suddenly and was unable to ambulate.  Has been using [name] cane since.  He also admits to chronic pain and difficulty using her right leg due to [name] lumbar puncture last year.  She has contrast dye allergy.  Review of Systems  Positive: Left lower extremity swelling, intermittent paresthesias, weakness Negative: Fever, chest pain, shortness of breath  Physical Exam  BP (!) 139/100 (BP Location: Left Arm)   Pulse 97   Temp 98 F (36.7 C) (Oral)   Resp 20   SpO2 97%  Gen:   Awake, no distress   Resp:  Normal effort  MSK:   Diffuse tenderness to any palpation to her left lower extremity without any overlying skin changes. Other:  Cranial nerves grossly intact Medical Decision Making  Medically screening exam initiated at 2:44 PM.  Appropriate orders placed.  [name] was informed that the remainder of the evaluation will be completed by another provider, this initial triage assessment does not replace that evaluation, and the importance of remaining in the ED until their evaluation  is complete.  Leg pain, intermittent paresthesias, weakness   Maria Aslin A, PA-C 10/02/21 1532    [name], Maria Critchley, DO 10/03/21 0732

## 2021-10-02 NOTE — ED Notes (Signed)
Patient called the switch board and had them transferred to charge nurse. Charge nurse told patient to give her a min. Charge nurse was requesting pain medication and for patient to be seen. Patient Left before md picked her up.

## 2021-10-02 NOTE — ED Notes (Signed)
Patient is complaining of the time she has been here. Patient wants something for pain.

## 2021-10-02 NOTE — Progress Notes (Signed)
Left lower extremity venous duplex has been completed. Preliminary results can be found in CV Proc through chart review.  Results were given to [facility] PA.  10/02/21 3:30 PM Carlos Levering RVT

## 2021-10-23 ENCOUNTER — Ambulatory Visit (HOSPITAL_COMMUNITY): Payer: No Payment, Other | Admitting: Clinical

## 2021-11-20 ENCOUNTER — Other Ambulatory Visit: Payer: Self-pay

## 2021-11-20 ENCOUNTER — Ambulatory Visit (INDEPENDENT_AMBULATORY_CARE_PROVIDER_SITE_OTHER): Payer: No Payment, Other | Admitting: Clinical

## 2021-11-20 DIAGNOSIS — F332 Major depressive disorder, recurrent severe without psychotic features: Secondary | ICD-10-CM | POA: Diagnosis not present

## 2021-11-20 NOTE — Progress Notes (Signed)
° °  THERAPIST PROGRESS NOTE  Session Time: 40 minutes  Participation Level: Active  Behavioral Response: CasualAlertDepressed  Type of Therapy: Individual Therapy  Treatment Goals addressed: Coping  Interventions: CBT and Supportive  Summary: [name] is a 35 y.o. female who presents for the scheduled session oriented times five, appropriately dressed and friendly. Client denied hallucinations and delusions.  Client reported since she was last seen things have been the same. Client reported ongoing anxiety and depression related to her socioeconomical needs and her health. Client reported she is trying to stay in "good vibes". Client reported she stopped taking Western medicine because doctors finding continue to be inconclusive. Client reported she is trying to holistic approach to help her pains. Client reported she experiences chronic pain throughout her body, muscle loss, SVT (elevated heart rate), onset paralysis which requires her to be assisted, and difficulty sitting and or standing for extended periods of time. Client reported she has not been successful with maintaining employment because she has difficulty sitting and/or standing for long periods of time. Client reported the pain can be a 10 and bring her to tears. Client reported over the years receiving a wide range of diagnosis such as fibromyalgia, degenerative disc disease, sickle cell, and involuntary uterine cramping. Client reported she thinks the treatment for the potentially inaccurate diagnosis may have made her physical condition worse. Client reported she has tried for disability for approximately 17 years and has continued to be denied. Client reported she stays in her car and in and out of friends house. Client reported that she is having side affects from taking her psych medications. Client reported Gabapentin has caused her to experience fatigue, suicidal ideations, and convulsions in her eye lid.     Suicidal/Homicidal: Nowithout intent/plan  Therapist Response:  Therapist began the appointment asking the client how she has been doing since she was last seen. Therapist used CBT to utilize active listening and positive emotional support towards her thoughts and feelings. Therapist used CBT to engage with the client to have her describe the severity of her physical pain.  Therapist used CBT to engage with the client to discuss side effects of her medication. Therapist used CBT to engage and have the client identify resources she has contacted to help her situations. Therapist assigned the client homework to use guided meditations as well as contacting three new disability offices to inquire about her case. Client was scheduled for next appointment.    Plan: Return again in 5 weeks.  Diagnosis: Severe episode of recurrent major depressive disorder, without psychotic features    Birdena Jubilee Myrka Sylva, LCSW 11/20/2021

## 2021-11-21 ENCOUNTER — Encounter: Payer: Self-pay | Admitting: Hematology and Oncology

## 2021-11-28 ENCOUNTER — Encounter (HOSPITAL_COMMUNITY): Payer: Self-pay

## 2021-11-28 ENCOUNTER — Telehealth (HOSPITAL_COMMUNITY): Payer: No Payment, Other | Admitting: Physician Assistant

## 2021-12-24 ENCOUNTER — Telehealth (HOSPITAL_COMMUNITY): Payer: Self-pay | Admitting: Clinical

## 2021-12-24 NOTE — Telephone Encounter (Signed)
Therapist attempted to call the client via telephone to follow-up with her about applying for the orange card and other support groups she can attend in the community.  Client did not answer the phone.  Therapist left a voicemail with the office phone number for her to call back at her convenience.

## 2021-12-31 ENCOUNTER — Telehealth (HOSPITAL_COMMUNITY): Payer: Self-pay | Admitting: Clinical

## 2021-12-31 NOTE — Telephone Encounter (Signed)
Patient called this morning request to speak with therapist via telephone today. Pt also notes she will try to come tomorrow as a walk-in to see therapist.

## 2021-12-31 NOTE — Telephone Encounter (Signed)
Therapist made alcohol because the client to return her previous message.  Therapist spoke with the client about following up to apply for the orange card, housing services, and seeking assistance with filing for disability.  Client reported she was told that she needs to find someone to provide a letter for her stating that she is homeless and does not receive income to apply for the orange card.  Client reported she is going to contact the disability advocacy center to see if they are able to help her with the following process.  Therapist spoke with the client about looking into additional housing resources for the client to follow-up with later on.  Client reported no crisis needs.

## 2022-01-21 ENCOUNTER — Ambulatory Visit (HOSPITAL_COMMUNITY): Payer: No Payment, Other | Admitting: Clinical

## 2022-02-11 ENCOUNTER — Ambulatory Visit (HOSPITAL_COMMUNITY): Payer: No Payment, Other | Admitting: Clinical

## 2022-02-12 ENCOUNTER — Telehealth (HOSPITAL_COMMUNITY): Payer: Self-pay | Admitting: Clinical

## 2022-02-12 NOTE — Telephone Encounter (Signed)
Therapist spoke with the client via tele-phone. Client reported she has been engaged with the [facility] and [employer] case [employer]ight forwarder in regards to housing. 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 Client reported she is feeling optimistic and in a better mood. Client reported she will come in for her next scheduled therapy appointment.   ?

## 2022-03-05 ENCOUNTER — Telehealth (HOSPITAL_COMMUNITY): Payer: Self-pay | Admitting: Clinical

## 2022-03-05 NOTE — Telephone Encounter (Signed)
Therapist received an incoming phone call from the client.  Client reported she would like to get back on schedule with a psychiatrist to start back her psychiatric medications.  Client reported she has been going through some stressors with family and would like to be put on the cancellation list with the therapist.  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  Client reported she is still working with [employer] to secure housing.  Client reported she will try to stay enabled, instead of her car as she can.  Client denied crisis for suicidal ideations. ?

## 2022-03-24 ENCOUNTER — Ambulatory Visit (INDEPENDENT_AMBULATORY_CARE_PROVIDER_SITE_OTHER): Payer: No Payment, Other | Admitting: Clinical

## 2022-03-24 DIAGNOSIS — F332 Major depressive disorder, recurrent severe without psychotic features: Secondary | ICD-10-CM | POA: Diagnosis not present

## 2022-03-24 NOTE — Progress Notes (Signed)
? ?THERAPIST PROGRESS NOTE ? ?Session Time: 35 minutes ? ?Participation Level: Active ? ?Behavioral Response: CasualAlertDepressed ? ?Type of Therapy: Individual Therapy ? ?Treatment Goals addressed: Client will identify 3 cognitive patterns and believes that support depression ? ?ProgressTowards Goals: Progressing ? ?Interventions: CBT and Supportive ? ?Summary:  ?[name] is a 36 y.o. female who presents for the scheduled session oriented times five, appropriately dressed, and friendly. Client denied hallucinations and delusions. ?Client reported on today she is feeling sad and overwhelmed. 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 Client reported she was too overwhelmed with life stressors regarding her temporary housing situation with sister, health and figuring out where to sleep it made it difficult for her to focus on work. Client reported on today she has an interview with [employer]. Client reported she prescreened with the point of contact to let her know about her condition and they were still wanting her to interview. Client reported she would like to maintain employment but worries she will not be able to keep up due to her limited physical ability/ illnesses that makes it hard for her to perform. Client reported its hard for her to live in the present with so many other things not going as she expected. Client reported while crying "I never thought my life would go this way". Client reported she has not heard a phone up phone call from [place] about housing and is not sure what is going on. Client reported regarding her health she is keeping appointments but is always scared about her testing. Client reported her mother, sister and grandmother passed away tragically after misdiagnosis from doctors. Client recalled being the one finding her mother actively dying at home and the image to this day still plays flashbacks which made it hard for her to sleep. Client  reported her PTSD symptoms stem from being homeless, family members passing and trauma with her medical concerns. Client reported she will be meeting with the cardiologist to do more testing regarding an irregular heart beat. Client reported there is a family history of seizure and fainting. Client reported her appetite has been lacking due to food sensitivity. Client reported she has been losing weight evidenced by her clothes not fitting. Client reported she will make a call to be scheduled with a nutritionist as she has tried in the past and found it to be helpful. ?Evidence of progress towards goal:  client reported she has followed through with at least 3 suggestions for housing referrals, community resources, and employment opportunity. Client reported 1 skill of reframing her thinking pattern to improve symptoms of depression and distress.  Client also reported 1 reason related to family history that contributes to her depression and anxiety related to seeking medical attention. ? ? ? ?Suicidal/Homicidal: Nowithout intent/plan ? ?Therapist Response:  ?Therapist began the appointment asking the client how she has been doing since last seen. ?Therapist used CBT to engage in using active listening and positive emotional support towards her thoughts and feelings. ?Therapist used CBT to ask client how her daily activity is affected by her ongoing physical pains and/or sickness that she continues to experience. ?Therapist used CBT to normalize the clients emotional reaction and discussed mindfulness activities such as constant reframing and breathing techniques. ?Therapist used CBT to discuss changes that have recently occurred with her occupation and neck steps that she is thinking of taking to help improve her financial situation. ?Therapist used CBT ask the client to identify her  progress with frequency of use with coping skills with continued practice in her daily activity.    ?Therapist assigned the client  homework to practice cognitive reframing. ?Client was scheduled for next appointment. ? ? ? ?Plan: Return again in 4 weeks. ? 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 ?Collaboration of Care: Patient refused AEB none requested by the client. ? ?Patient/Guardian was advised Release of Information must be obtained prior to any record release in order to collaborate their care with an outside provider. Patient/Guardian was advised if they have not already done so to contact the registration department to sign all necessary forms in order for [name] to release information regarding their care.  ? ?Consent: Patient/Guardian gives verbal consent for treatment and assignment of benefits for services provided during this visit. Patient/Guardian expressed understanding and agreed to proceed.  ? ?Birdena Jubilee Cletus Paris, LCSW ?03/24/2022 ? ?

## 2022-03-25 ENCOUNTER — Other Ambulatory Visit: Payer: Self-pay

## 2022-03-25 ENCOUNTER — Encounter (HOSPITAL_COMMUNITY): Payer: Self-pay | Admitting: Physician Assistant

## 2022-03-25 ENCOUNTER — Ambulatory Visit (INDEPENDENT_AMBULATORY_CARE_PROVIDER_SITE_OTHER): Payer: No Payment, Other | Admitting: Physician Assistant

## 2022-03-25 ENCOUNTER — Encounter: Payer: Self-pay | Admitting: Hematology and Oncology

## 2022-03-25 VITALS — BP 129/81 | HR 100 | Ht 70.0 in | Wt 161.0 lb

## 2022-03-25 DIAGNOSIS — T887XXA Unspecified adverse effect of drug or medicament, initial encounter: Secondary | ICD-10-CM

## 2022-03-25 DIAGNOSIS — F332 Major depressive disorder, recurrent severe without psychotic features: Secondary | ICD-10-CM

## 2022-03-25 DIAGNOSIS — F411 Generalized anxiety disorder: Secondary | ICD-10-CM

## 2022-03-25 DIAGNOSIS — F4310 Post-traumatic stress disorder, unspecified: Secondary | ICD-10-CM | POA: Diagnosis not present

## 2022-03-25 MED ORDER — BUSPIRONE HCL 7.5 MG PO TABS
7.5000 mg | ORAL_TABLET | Freq: Two times a day (BID) | ORAL | 1 refills | Status: AC
  Filled 2022-03-25: qty 60, 30d supply, fill #0

## 2022-03-25 MED ORDER — VORTIOXETINE HBR 10 MG PO TABS
10.0000 mg | ORAL_TABLET | Freq: Every day | ORAL | 1 refills | Status: DC
  Filled 2022-03-25: qty 30, 30d supply, fill #0

## 2022-03-25 MED ORDER — ONDANSETRON HCL 4 MG PO TABS
4.0000 mg | ORAL_TABLET | Freq: Every day | ORAL | 0 refills | Status: AC | PRN
  Filled 2022-03-25: qty 30, 30d supply, fill #0

## 2022-03-25 NOTE — Progress Notes (Signed)
[facility] MD/PA/NP OP Progress Note ? ?03/25/2022 4:03 PM ?[name]  ?MRN:  [id number] ? ?Chief Complaint:  ?Chief Complaint  ?Patient presents with  ? Medication Management  ? ?HPI:  ? ?[name] is a 36 year old female with a past psychiatric history significant for major depressive disorder without psychotic features, generalized anxiety disorder, and PTSD who presents to [facility] for follow-up and medication management.  Patient was last seen by this provider on 06/11/2021.  During the conclusion of the last encounter, patient was taking the following medications: ? ?Desvenlafaxine succinate ER (Pristiq) 25 mg TB24 daily ?Gabapentin 100 mg 3 times daily ? ?Patient reports that she has not been taking her medications due to side effects experienced by the use of the medications.  Patient reports that she experiences vision problems when taking gabapentin.  After seeing an eye specialist, patient's suspicions were confirmed regarding her side effects caused by her gabapentin use.  Patient also reports experiencing dry mouth when taking Pristiq.  To combat her dry mouth, patient has been eating mints. ? ?Patient continues to endorse depression and is unsure of the frequency in which her depression occurs.  She believes that she has roughly 2 depressive episodes a week.  Patient's depressive episodes are characterized by the following symptoms: excessive worry, crying spells, racing thoughts, and low mood.  Patient reports that she is often figuratively taken back in time to events significant to her when ruminating.  Patient endorses anxiety and rates her anxiety an 8.5-9 out of 10.  Patient's stressors include life stressors in general.  Patient reports that she struggles with not being where she wants to be in life.  Patient is also unemployed and has several debilitating health issues.  Patient struggles with not having anywhere to call home.  A PHQ-9 screen was  performed with the patient scoring a 17.  A GAD-7 screen was also performed with the patient scoring a 21. ? ?Patient is alert and oriented x4, calm, cooperative, and fully engaged in conversation during the encounter.  Patient endorses fine mood.  Patient denies suicidal or homicidal ideations.  She further denies auditory or visual hallucinations and does not appear to be responding to internal/external stimuli.  Patient endorses poor sleep stating that she gets roughly 2 to 4 hours of sleep each night.  Patient's sleep appears to be affected by her level of anxiety and often states that she is up by the wee hours of the morning.  Patient endorses decreased appetite and eats on average 1 meal per day.  Patient reports that she has issues with food in general.  Patient denies alcohol consumption, tobacco use, and illicit drug use. ? ?Visit Diagnosis:  ?  ICD-10-CM   ?1. Severe episode of recurrent major depressive disorder, without psychotic features ([place])  F33.2 vortioxetine HBr (TRINTELLIX) 10 MG TABS tablet  ?  ?2. Generalized anxiety disorder  F41.1 busPIRone (BUSPAR) 7.5 MG tablet  ?  ?3. PTSD (post-traumatic stress disorder)  F43.10   ?  ?4. Side effect of medication  T88.7XXA ondansetron (ZOFRAN) 4 MG tablet  ?  ? ? ?Past Psychiatric History:  ?Major depressive disorder ?Generalized anxiety disorder ?PTSD ? ?Past Medical History:  ?Past Medical History:  ?Diagnosis Date  ? Allergic rhinitis 01/24/2015  ? Arrhythmia Dx 2015  ? Back pain   ? Brain tumor (benign) ([place])   ? Breast mass   ? Common migraine 04/15/2017  ? Depression Dx 2000  ? Fibromyalgia   ?  GERD (gastroesophageal reflux disease) 03/13/2015  ? Headache   ? migraines  ? Heart murmur Dx 2015  ? 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 On Hgb electrophoresis  ? Hypertension Dx 20015  ? Lumbar radicular pain 11/22/2014  ? Pain disorder 08/29/2017  ? Panic attack Dx 2006  ? Positive depression screening 08/29/2017  ? PTSD (post-traumatic stress disorder) Dx 2013  ?  Seizures ([place]) 2001  ? Vitamin D deficiency 01/24/2015  ?  ?Past Surgical History:  ?Procedure Laterality Date  ? BREAST BIOPSY Left   ? ESOPHAGOGASTRODUODENOSCOPY N/A 08/14/2015  ? Procedure: ESOPHAGOGASTRODUODENOSCOPY (EGD);  Surgeon: Carol Ada, MD;  Location: [facility];  Service: Endoscopy;  Laterality: N/A;  ? ? ?Family Psychiatric History:  ?Patient is unsure of family psychiatric history since most of her immediate family is deceased ? ?Family History:  ?Family History  ?Problem Relation Age of Onset  ? Heart attack Mother   ? Heart disease Mother   ? Sudden death Mother   ? Brain cancer Mother   ? Sudden death Sister   ? Heart attack Sister   ? Sudden death Maternal Grandmother   ? Fainting Maternal Grandmother   ? Heart attack Maternal Grandmother   ? Breast cancer Paternal Grandmother   ? Breast cancer Cousin   ? Breast cancer Paternal Aunt   ? Seizures Maternal Aunt   ? ? ?Social History:  ?Social History  ? ?Socioeconomic History  ? Marital status: Single  ?  Spouse name: Not on file  ? Number of children: Not on file  ? Years of education: 37  ? Highest education level: Not on file  ?Occupational History  ? Not on file  ?Tobacco Use  ? Smoking status: Former  ?  Types: Cigarettes  ?  Quit date: 06/23/2012  ?  Years since quitting: 9.7  ? Smokeless tobacco: Never  ?Vaping Use  ? Vaping Use: Never used  ?Substance and Sexual Activity  ? Alcohol use: Never  ? Drug use: No  ? Sexual activity: Not Currently  ?  Birth control/protection: None  ?  Comment: last 2010  ?Other Topics Concern  ? Not on file  ?Social History Narrative  ? Patient is single, currently raising her nephews  ? Patient is right handed  ? Patient's education level is high school graduate  ? Patient doesn't drink caffeine  ?   ?   ? 02/05/18:  ? Pt lives in 2 story home with her friend  ? States she has 4 years of college  ? Worked as CSR for [employer]  ? ?Social Determinants of Health  ? ?Financial Resource Strain: Not on file   ?Food Insecurity: Not on file  ?Transportation Needs: Not on file  ?Physical Activity: Not on file  ?Stress: Not on file  ?Social Connections: Not on file  ? ? ?Allergies:  ?Allergies  ?Allergen Reactions  ? Aspirin Hives  ? Other Itching and Rash  ?  iodine  ? Adhesive [Tape]   ?  Skin irritation  ? Contrast Media [Iodinated Contrast Media] Hives  ? ? ?Metabolic Disorder Labs: ?No results found for: HGBA1C, MPG ?Lab Results  ?Component Value Date  ? PROLACTIN 6.8 11/08/2009  ? PROLACTIN  01/06/2009  ?  14.8 ?(NOTE)     Reference Ranges:                 Female:  2.1 -  17.1 ng/ml                 Female:   Pregnant          9.7 - 208.5 ng/mL                           Non Pregnant      2.8 -  29.2 ng/mL                           Post  ?Menopausal   1.8 -  20.3 ng/mL                    ? ?Lab Results  ?Component Value Date  ? CHOL 109 06/07/2012  ? TRIG 61 06/07/2012  ? HDL 38 (L) 06/07/2012  ? CHOLHDL 2.9 06/07/2012  ? VLDL 12 06/07/2012  ? Plato 59 06/07/2012  ? ?Lab Results  ?Component Value Date  ? TSH 1.120 04/28/2018  ? TSH 0.991 08/19/2017  ? ? ?Therapeutic Level Labs: ?No results found for: LITHIUM ?No results found for: VALPROATE ?No components found for:  CBMZ ? ?Current Medications: ?Current Outpatient Medications  ?Medication Sig Dispense Refill  ? ondansetron (ZOFRAN) 4 MG tablet Take 1 tablet (4 mg total) by mouth daily as needed for nausea or vomiting. 30 tablet 0  ? vortioxetine HBr (TRINTELLIX) 10 MG TABS tablet Take 1 tablet (10 mg total) by mouth daily. 30 tablet 1  ? busPIRone (BUSPAR) 7.5 MG tablet Take 1 tablet (7.5 mg total) by mouth 2 (two) times daily. 60 tablet 1  ? famotidine (PEPCID) 40 MG tablet Take 40 mg by mouth 2 (two) times daily.    ? gabapentin (NEURONTIN) 100 MG capsule Take 1 capsule (100 mg total) by mouth 3 (three) times daily. 90 capsule 1  ? lidocaine (XYLOCAINE) 5 % ointment Apply 1 application topically every evening.    ? Multiple Vitamin (MULTIVITAMIN)  tablet Take 1 tablet by mouth daily.    ? RABEprazole (ACIPHEX) 20 MG tablet Take 1 tablet by mouth daily as needed.    ? ?No current facility-administered medications for this visit.  ? ? ? ?Musculoskeletal: ?Stren

## 2022-03-26 ENCOUNTER — Other Ambulatory Visit: Payer: Self-pay

## 2022-03-29 ENCOUNTER — Encounter (HOSPITAL_COMMUNITY): Payer: Self-pay

## 2022-03-29 NOTE — Plan of Care (Signed)
?  Problem: Depression CCP Problem  1  Goal: LTG: [name] WILL SCORE LESS THAN 10 ON THE PATIENT HEALTH QUESTIONNAIRE (PHQ-9) Outcome: Progressing Goal: STG: [name] WILL COMPLETE AT LEAST 80% OF ASSIGNED HOMEWORK Outcome: Progressing Goal: STG: [name] WILL IDENTIFY 3 COGNITIVE PATTERNS AND BELIEFS THAT SUPPORT DEPRESSION Outcome: Progressing   

## 2022-04-02 ENCOUNTER — Other Ambulatory Visit: Payer: Self-pay

## 2022-04-22 ENCOUNTER — Telehealth (HOSPITAL_COMMUNITY): Payer: Self-pay | Admitting: Clinical

## 2022-04-22 NOTE — Telephone Encounter (Signed)
Therapist returned a phone call to the client. Client reported she was unknowingly dropped from the [place] housing program after trying to call her case worker on a weekly basis without a return call from him. Client reported she is upset and disappointed by the treatment. Client reported [place] airport did not work out well. Client reported she is still in contact with women's resource center to help locate other employment opportunities. Client reported she is still sleeping in her car. Therapist instructed the client to try contacting [employer] for job opportunities. Client denied crisis needs.

## 2022-05-06 ENCOUNTER — Encounter (HOSPITAL_COMMUNITY): Payer: No Payment, Other | Admitting: Physician Assistant

## 2022-07-16 ENCOUNTER — Ambulatory Visit (INDEPENDENT_AMBULATORY_CARE_PROVIDER_SITE_OTHER): Payer: No Payment, Other | Admitting: Clinical

## 2022-07-16 DIAGNOSIS — F332 Major depressive disorder, recurrent severe without psychotic features: Secondary | ICD-10-CM | POA: Diagnosis not present

## 2022-07-16 NOTE — Progress Notes (Signed)
THERAPIST PROGRESS NOTE Virtual Visit via Video Note  I connected with [name] on 07/16/22 at 10:00 AM EDT by a video enabled telemedicine application and verified that I am speaking with the correct person using two identifiers.  Location: Patient: home Provider: office   I discussed the limitations of evaluation and management by telemedicine and the availability of in person appointments. The patient expressed understanding and agreed to proceed.   Follow Up Instructions: I discussed the assessment and treatment plan with the patient. The patient was provided an opportunity to ask questions and all were answered. The patient agreed with the plan and demonstrated an understanding of the instructions.   The patient was advised to call back or seek an in-person evaluation if the symptoms worsen or if the condition fails to improve as anticipated.   Session Time: 20 minutes  Participation Level: Active  Behavioral Response: NAAlertDepressed  Type of Therapy: Individual Therapy  Treatment Goals addressed: client will complete 80% of assigned homework  ProgressTowards Goals: Progressing  Interventions: CBT  Summary:  [name] is a 36 y.o. female who presents for walk-in virtual appointment.  Client presented oriented x5, cooperative and friendly.  Client denied hallucinations and delusions. Client reported on today she has been feeling depressed and overwhelmed. Client was very tearful explaining her symptoms. Client reported she started work at a new job. 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  Client reported the original manager who hired her and was made aware of her disability and accommodations that need to be made has been willing to work with her. Client reported since he has been out over the past 2 weeks the oversight manager has been very dismissive of her physical ailments and small accommodations that she may  need such as sitting down instead of standing up to perform task.  Client reported her boss is not back yet from being on leave and she does not know what to do.  Client reported on today she is feeling like she will want to go to the emergency room to see if she can be treated for her pain that radiates all over her body.  Client reported sometimes her muscles gets so fatigued that they it makes her a fall risk.  Client reported she works from 10 AM to 3 PM today.  Client reported she is nervous about asking the if she can be permitted to leave to get treatment at the hospital. Client reported other stressors as well such as housing. Evidence of progress towards goal:  client reported attempt of skill to communicate her needs and problems.   Suicidal/Homicidal: Nowithout intent/plan  Therapist Response:  Therapist began the appointment asking the client how she is doing since last seen. Therapist used CBT to engage using active listening and positive emotional support. Therapy used CBT is asked the client open-ended questions to help her express the stressor and her thought processing on how to create a resolution. Therapist used CBT to discuss priority of needs and reframing of symptoms negative projection from others in this situation. Therapist used CBT to reinforce positive affirmations and self-esteem and the client to help her feel empowered. Therapist used CBT ask the client to identify her progress with frequency of use with coping skills with continued practice in her daily activity.    Therapist assigned the client homework to communicate her needs at work and reinforced getting herself seen at a hospital.   Plan: Return  again in 1 weeks.  Diagnosis: severe episode of recurrent major depressive disorder  Collaboration of Care: Patient refused AEB none requested by the client at this time.  Patient/Guardian was advised Release of Information must be obtained prior to any record  release in order to collaborate their care with an outside provider. Patient/Guardian was advised if they have not already done so to contact the registration department to sign all necessary forms in order for [place] to release information regarding their care.   Consent: Patient/Guardian gives verbal consent for treatment and assignment of benefits for services provided during this visit. Patient/Guardian expressed understanding and agreed to proceed.   [place], LCSW 07/16/2022

## 2022-07-16 NOTE — Plan of Care (Signed)
  Problem: Depression CCP Problem  1  Goal: STG: Lucila WILL COMPLETE AT LEAST 80% OF ASSIGNED HOMEWORK Outcome: Progressing Goal: STG: Jenea WILL IDENTIFY 3 COGNITIVE PATTERNS AND BELIEFS THAT SUPPORT DEPRESSION Outcome: Progressing   Problem: Depression CCP Problem  1  Goal: LTG: Kaitlen WILL SCORE LESS THAN 10 ON THE PATIENT HEALTH QUESTIONNAIRE (PHQ-9) Outcome: Not Progressing

## 2022-07-21 ENCOUNTER — Emergency Department (HOSPITAL_BASED_OUTPATIENT_CLINIC_OR_DEPARTMENT_OTHER): Payer: Self-pay

## 2022-07-21 ENCOUNTER — Encounter (HOSPITAL_BASED_OUTPATIENT_CLINIC_OR_DEPARTMENT_OTHER): Payer: Self-pay | Admitting: Emergency Medicine

## 2022-07-21 ENCOUNTER — Other Ambulatory Visit: Payer: Self-pay

## 2022-07-21 ENCOUNTER — Encounter: Payer: Self-pay | Admitting: Hematology and Oncology

## 2022-07-21 ENCOUNTER — Emergency Department: Payer: Self-pay

## 2022-07-21 ENCOUNTER — Emergency Department (HOSPITAL_BASED_OUTPATIENT_CLINIC_OR_DEPARTMENT_OTHER)
Admission: EM | Admit: 2022-07-21 | Discharge: 2022-07-21 | Disposition: A | Discharge status: Home / Self Care | Payer: Self-pay | Attending: Emergency Medicine | Admitting: Emergency Medicine

## 2022-07-21 DIAGNOSIS — H532 Diplopia: Secondary | ICD-10-CM | POA: Insufficient documentation

## 2022-07-21 DIAGNOSIS — I10 Essential (primary) hypertension: Secondary | ICD-10-CM | POA: Insufficient documentation

## 2022-07-21 DIAGNOSIS — R531 Weakness: Secondary | ICD-10-CM | POA: Insufficient documentation

## 2022-07-21 DIAGNOSIS — R202 Paresthesia of skin: Secondary | ICD-10-CM | POA: Insufficient documentation

## 2022-07-21 DIAGNOSIS — E538 Deficiency of other specified B group vitamins: Secondary | ICD-10-CM | POA: Insufficient documentation

## 2022-07-21 DIAGNOSIS — R0789 Other chest pain: Secondary | ICD-10-CM | POA: Insufficient documentation

## 2022-07-21 DIAGNOSIS — R072 Precordial pain: Secondary | ICD-10-CM | POA: Insufficient documentation

## 2022-07-21 LAB — CBC WITH DIFFERENTIAL/PLATELET
Abs Immature Granulocytes: 0.02 10*3/uL (ref 0.00–0.07)
Basophils Absolute: 0 10*3/uL (ref 0.0–0.1)
Basophils Relative: 0 %
Eosinophils Absolute: 0 10*3/uL (ref 0.0–0.5)
Eosinophils Relative: 0 %
HCT: 38.8 % (ref 36.0–46.0)
Hemoglobin: 13.9 g/dL (ref 12.0–15.0)
Immature Granulocytes: 0 %
Lymphocytes Relative: 36 %
Lymphs Abs: 2.3 10*3/uL (ref 0.7–4.0)
MCH: 31.8 pg (ref 26.0–34.0)
MCHC: 35.8 g/dL (ref 30.0–36.0)
MCV: 88.8 fL (ref 80.0–100.0)
Monocytes Absolute: 0.2 10*3/uL (ref 0.1–1.0)
Monocytes Relative: 4 %
Neutro Abs: 3.7 10*3/uL (ref 1.7–7.7)
Neutrophils Relative %: 60 %
Platelets: 227 10*3/uL (ref 150–400)
RBC: 4.37 MIL/uL (ref 3.87–5.11)
RDW: 11.8 % (ref 11.5–15.5)
WBC: 6.2 10*3/uL (ref 4.0–10.5)
nRBC: 0 % (ref 0.0–0.2)

## 2022-07-21 LAB — BASIC METABOLIC PANEL
Anion gap: 7 (ref 5–15)
BUN: 12 mg/dL (ref 6–20)
CO2: 25 mmol/L (ref 22–32)
Calcium: 9.1 mg/dL (ref 8.9–10.3)
Chloride: 107 mmol/L (ref 98–111)
Creatinine, Ser: 0.63 mg/dL (ref 0.44–1.00)
GFR, Estimated: >60 mL/min (ref >60)
Glucose, Bld: 84 mg/dL (ref 70–99)
Potassium: 3.6 mmol/L (ref 3.5–5.1)
Sodium: 139 mmol/L (ref 135–145)

## 2022-07-21 LAB — HCG, QUANTITATIVE, PREGNANCY: hCG, Beta Chain, Quant, S: <1 m[IU]/mL (ref <5)

## 2022-07-21 LAB — CK: Total CK: 108 U/L (ref 38–234)

## 2022-07-21 LAB — TROPONIN I (HIGH SENSITIVITY)
Troponin I (High Sensitivity): 3 ng/L (ref <18)
Troponin I (High Sensitivity): 3 ng/L (ref <18)

## 2022-07-21 LAB — SEDIMENTATION RATE: Sed Rate: 3 mm/hr (ref 0–22)

## 2022-07-21 MED ORDER — DIPHENHYDRAMINE HCL 50 MG/ML IJ SOLN: 50.0000 mg | Freq: Once | INTRAMUSCULAR | Status: DC

## 2022-07-21 MED ORDER — METHYLPREDNISOLONE SODIUM SUCC 40 MG IJ SOLR
40.0000 mg | Freq: Once | INTRAMUSCULAR | Status: AC
  Administered 2022-07-21: 40 mg via INTRAVENOUS
  Filled 2022-07-21: qty 1

## 2022-07-21 MED ORDER — DIPHENHYDRAMINE HCL 25 MG PO CAPS: 50.0000 mg | ORAL_CAPSULE | Freq: Once | ORAL | Status: DC

## 2022-07-21 MED ORDER — LIDOCAINE 5 % EX PTCH
1.0000 | MEDICATED_PATCH | CUTANEOUS | Status: DC
  Administered 2022-07-21: 1 via TRANSDERMAL
  Filled 2022-07-21: qty 1

## 2022-07-21 NOTE — ED Triage Notes (Signed)
Has a redden area to central chest since Friday that is very painful to touch , pain rads to neck and rt arm

## 2022-07-21 NOTE — ED Provider Notes (Incomplete)
[facility] EMERGENCY DEPARTMENT Provider Note   CSN: [id number] Arrival date & time: 07/21/22  1031     History {Add pertinent medical, surgical, social history, OB history to HPI:1} Chief Complaint  Patient presents with   Chest Pain    [name] is a 36 y.o. female.   Chest Pain      Home Medications Prior to Admission medications   Medication Sig Start Date End Date Taking? Authorizing Provider  busPIRone (BUSPAR) 7.5 MG tablet Take 1 tablet (7.5 mg total) by mouth 2 (two) times daily. 03/25/22   Nwoko, Terese Door, PA  famotidine (PEPCID) 40 MG tablet Take 40 mg by mouth 2 (two) times daily. 06/21/20   [provider]  gabapentin (NEURONTIN) 100 MG capsule Take 1 capsule (100 mg total) by mouth 3 (three) times daily. 06/11/21 06/11/22  Nwoko, Terese Door, PA  lidocaine (XYLOCAINE) 5 % ointment Apply 1 application topically every evening. 04/24/20   [provider]  Multiple Vitamin (MULTIVITAMIN) tablet Take 1 tablet by mouth daily.    [provider]  ondansetron (ZOFRAN) 4 MG tablet Take 1 tablet (4 mg total) by mouth daily as needed for nausea or vomiting. 03/25/22 03/25/23  Nwoko, Terese Door, PA  RABEprazole (ACIPHEX) 20 MG tablet Take 1 tablet by mouth daily as needed. 06/21/20 06/21/21  [provider]  vortioxetine HBr (TRINTELLIX) 10 MG TABS tablet Take 1 tablet (10 mg total) by mouth daily. 03/25/22   Nwoko, Terese Door, PA      Allergies    Aspirin, Other, Adhesive [tape], and Contrast media [iodinated contrast media]    Review of Systems   Review of Systems  Cardiovascular:  Positive for chest pain.    Physical Exam Updated Vital Signs BP (!) 145/88 (BP Location: Left Arm)   Pulse 76   Temp 98.2 F (36.8 C) (Oral)   Resp 18   Ht '5\' 10"'$  (1.778 m)   Wt 63.5 kg   LMP 07/10/2022   SpO2 100%   BMI 20.09 kg/m  Physical Exam  ED Results / Procedures / Treatments   Labs (all labs ordered are listed, but only abnormal  results are displayed) Labs Reviewed - No data to display  EKG EKG Interpretation  Date/Time:  Monday July 21 2022 10:49:16 EDT Ventricular Rate:  87 PR Interval:  206 QRS Duration: 76 QT Interval:  366 QTC Calculation: 440 R Axis:   86 Text Interpretation: Normal sinus rhythm Possible Left atrial enlargement Minimal voltage criteria for LVH, may be normal variant ( Sokolow-Lyon ) Borderline ECG When compared with ECG of 26-Jun-2019 18:49, PREVIOUS ECG IS PRESENT Confirmed by Regan Lemming ([id number]) on 07/21/2022 11:49:44 AM  Radiology DG Chest 2 View  Result Date: 07/21/2022 CLINICAL DATA:  Chest pain EXAM: CHEST - 2 VIEW COMPARISON:  2019 FINDINGS: The heart size and mediastinal contours are within normal limits. Both lungs are clear. No pleural effusion or pneumothorax. The visualized skeletal structures are unremarkable. IMPRESSION: No active cardiopulmonary disease. Electronically Signed   By: Macy Mis M.D.   On: 07/21/2022 12:37    Procedures Procedures  {Document cardiac monitor, telemetry assessment procedure when appropriate:1}  Medications Ordered in ED Medications - No data to display  ED Course/ Medical Decision Making/ A&P                           Medical Decision Making Amount and/or Complexity of Data Reviewed Radiology: ordered.   ***  {  Document critical care time when appropriate:1} {Document review of labs and clinical decision tools ie heart score, Chads2Vasc2 etc:1}  {Document your independent review of radiology images, and any outside records:1} {Document your discussion with family members, caretakers, and with consultants:1} {Document social determinants of health affecting pt's care:1} {Document your decision making why or why not admission, treatments were needed:1} Final Clinical Impression(s) / ED Diagnoses Final diagnoses:  None    Rx / DC Orders ED Discharge Orders     None

## 2022-07-21 NOTE — Discharge Instructions (Addendum)
Please follow-up with your PCP and specialty teams for further management of your symptoms.  The imaging today was overall reassuring.  Due to the slight enlargement your thyroid if you discussed, please follow-up with your rheumatologist for this.  The inflammatory testing and more obscure vitamin levels will not return now but please have your PCP follow-up on them.  If any symptoms change or worsen acutely, please return to the nearest emergency room.

## 2022-07-21 NOTE — ED Notes (Signed)
To Ct ambulatory

## 2022-07-21 NOTE — ED Notes (Signed)
Dr. Tegeler at bedside.  

## 2022-07-21 NOTE — ED Provider Notes (Signed)
4:14 PM Care assumed from Dr. Armandina Gemma.  At time of transfer of care, patient's labs been reassuring and the folate and B12 will likely not return tonight.  We will let the PCP follow-up on these results.  Patient is awaiting CT of the chest and head and if they are reassuring, plan of care is to discharge home for outpatient specialist follow-up with [facility] and [place].  Patient agreed with this plan with the previous team.  Anticipate discharge after imaging if there are no acute findings.  5:38 PM The work-up we are waiting on returned reassuring.  CT scan of the head and neck showed some arthritis but no acute abnormalities.  CT of the chest did not show concerning finding but did show slight enlargement of the thyroid.  Recent thyroid testing was reassuring and patient was encouraged to follow-up with her rheumatologist, neurologist, and PCP specialty teams for further management.  Patient agrees.  The folate, B12, and inflammatory markers will not return tonight and she understands to have her PCP follow-up on the results.  Patient otherwise her concerns and was discharged in good condition.   Clinical Impression: 1. Atypical chest pain     Disposition: Discharge  Condition: Good  I have discussed the results, Dx and Tx plan with the pt(& family if present). He/she/they expressed understanding and agree(s) with the plan. Discharge instructions discussed at great length. Strict return precautions discussed and pt &/or family have verbalized understanding of the instructions. No further questions at time of discharge.    New Prescriptions   No medications on file    Follow Up: your PCP and specialty teams     Caren Macadam, MD [address] [place] [address] [phone]        Damian Hofstra, Gwenyth Allegra, MD 07/21/22 1740

## 2022-07-21 NOTE — ED Notes (Signed)
Provider at bedside

## 2022-07-22 LAB — VITAMIN B12: Vitamin B-12: 132 pg/mL — ABNORMAL LOW (ref 180–914)

## 2022-07-22 LAB — C-REACTIVE PROTEIN: CRP: <0.5 mg/dL (ref <1.0)

## 2022-07-22 LAB — FOLATE: Folate: 7.5 ng/mL (ref >5.9)

## 2022-07-23 ENCOUNTER — Ambulatory Visit (INDEPENDENT_AMBULATORY_CARE_PROVIDER_SITE_OTHER): Payer: No Payment, Other | Admitting: Clinical

## 2022-07-23 DIAGNOSIS — F332 Major depressive disorder, recurrent severe without psychotic features: Secondary | ICD-10-CM

## 2022-07-28 NOTE — Plan of Care (Signed)
  Problem: Depression CCP Problem  1  Goal: LTG: Chrystian WILL SCORE LESS THAN 10 ON THE PATIENT HEALTH QUESTIONNAIRE (PHQ-9) Outcome: Not Progressing Goal: STG: Evelyne WILL COMPLETE AT LEAST 80% OF ASSIGNED HOMEWORK Outcome: Not Progressing Goal: STG: Francy WILL IDENTIFY 3 COGNITIVE PATTERNS AND BELIEFS THAT SUPPORT DEPRESSION Outcome: Not Progressing

## 2022-07-28 NOTE — Progress Notes (Signed)
THERAPIST PROGRESS NOTE Virtual Visit via Telephone Note  I connected with [name] on 07/23/2022 at 11:00 AM EDT by telephone and verified that I am speaking with the correct person using two identifiers.  Location: Patient: friends house Provider: office   I discussed the limitations, risks, security and privacy concerns of performing an evaluation and management service by telephone and the availability of in person appointments. I also discussed with the patient that there may be a patient responsible charge related to this service. The patient expressed understanding and agreed to proceed.   Follow Up Instructions: I discussed the assessment and treatment plan with the patient. The patient was provided an opportunity to ask questions and all were answered. The patient agreed with the plan and demonstrated an understanding of the instructions.   The patient was advised to call back or seek an in-person evaluation if the symptoms worsen or if the condition fails to improve as anticipated.   Session Time: 25 minutes  Participation Level: Active  Behavioral Response: NAAlertDepressed  Type of Therapy: Individual Therapy  Treatment Goals addressed: client will complete 80% of assigned homework  ProgressTowards Goals: Not Progressing  Interventions: CBT and Supportive  Summary:  [name] is a 36 y.o. female who presents for the scheduled session oriented times five and cooperative. Client denied hallucinations and delusions. Client reported on today she is has had some stress. Client reported she was let go from her job. Client reported ultimately the job was not as accommodating for her health needs as she thought. Client reported otherwise she is back to looking for another job. Client reported from her last appointment she decided to go to the hospital to be treated for her pain. Client reported she went on Monday. Client reported contributing to her visit she was  having blurry vision which has been a on and off symptom over the years. Client reported they ran tests and found that she has enlarged lymph node on her chest. Client reported she also has bulging disc and arthritis but they did not give her anything to help manage the pain and discomfort. Client reported she does not have the money to follow up with specialist. Client reported otherwise back in 2020 she applied for public housing. Client reported they called her about a place but had to put her back on the list because of some some discrepancy.  Evidence of progress towards goal:  client reported she is willing to continue to job hunt at least 3 days per week.    Suicidal/Homicidal: Nowithout intent/plan  Therapist Response:  Therapist began the appointment asking the client how she has been doing. Therapist used CBT to engage using active listening and positive emotional support. Therapist used CBT to engage and ask the client about work and health changes. Therapist used CBT to engage and discuss prioritizing her well being. Therapist used CBT to discuss tips for continued job employment. Therapist used CBT ask the client to identify her progress with frequency of use with coping skills with continued practice in her daily activity.    Therapist assigned the client homework to continue job hunting using the discussed tips and practice self care.    Plan: Return again in 4 weeks.  Diagnosis: severe episode of recurrent major depressive disorder, without psychotic features  Collaboration of Care: Patient refused AEB therapist gave the client information to the [facility] for additional support groups.  Patient/Guardian was advised Release of Information must be obtained prior  to any record release in order to collaborate their care with an outside provider. Patient/Guardian was advised if they have not already done so to contact the registration department to sign all necessary forms  in order for [place] to release information regarding their care.   Consent: Patient/Guardian gives verbal consent for treatment and assignment of benefits for services provided during this visit. Patient/Guardian expressed understanding and agreed to proceed.   [place], LCSW 07/23/2022

## 2022-08-27 ENCOUNTER — Ambulatory Visit (INDEPENDENT_AMBULATORY_CARE_PROVIDER_SITE_OTHER): Payer: Commercial Indemnity | Admitting: Clinical

## 2022-08-27 DIAGNOSIS — F332 Major depressive disorder, recurrent severe without psychotic features: Secondary | ICD-10-CM | POA: Diagnosis not present

## 2022-08-27 NOTE — Progress Notes (Signed)
THERAPIST PROGRESS NOTE Virtual Visit via Telephone Note  I connected with [name] on 08/27/2022 at  1:00 PM EDT by telephone and verified that I am speaking with the correct person using two identifiers.  Location: Patient: home Provider: office   I discussed the limitations, risks, security and privacy concerns of performing an evaluation and management service by telephone and the availability of in person appointments. I also discussed with the patient that there may be a patient responsible charge related to this service. The patient expressed understanding and agreed to proceed.  Follow Up Instructions: I discussed the assessment and treatment plan with the patient. The patient was provided an opportunity to ask questions and all were answered. The patient agreed with the plan and demonstrated an understanding of the instructions.   The patient was advised to call back or seek an in-person evaluation if the symptoms worsen or if the condition fails to improve as anticipated.   Session Time: 30 minutes  Participation Level: Active  Behavioral Response: NAAlertDepressed  Type of Therapy: Individual Therapy  Treatment Goals addressed: Client will identify 3 cognitive patterns and believes that support depression  ProgressTowards Goals: Progressing  Interventions: CBT and Supportive  Summary:  [name] is a 36 y.o. female who presents oriented x5 and cooperative.  Client denied hallucinations and delusions. Client reported on today that not much is changed.  Client reported she has been sleeping in her car and parking at a parking garage in downtown [place].  Client reported she has also been dealing with some dental issues with a history of gum disease and some other things.  Client reported she has been going to a clinic at the [facility] as she is able to collect some money to go towards paying for the visit.  Client reported she has ongoing  pain.  Client reported following her last ER visit they saw that she had enlarged lymph nodes and arthritis in her neck and back that would be enough to affect someone's mobility.  Client reported she does get down reflecting over things that have happened to her and never guessing that she will be in the position that she has been experiencing homelessness.  Client reported she has made commitments herself to work towards getting out of where she is staying that way. Evidence of progress towards goal: Client reported using 1 particular positive affirmation 7 days out of the week to help alleviate her depressive thoughts.  Suicidal/Homicidal: Nowithout intent/plan  Therapist Response:  Therapist began the appointment asking the client how she has been doing since last seen. Therapist used CBT to engage using active listening and positive emotional support. Therapist used CBT to ask the client about her physical safety with where she is sleeping at night. Therapist used CBT to how she is coping with depressive symptoms. Therapist used CBT to ask and identify the clients psychosocial needs. Therapist used CBT ask the client to identify her progress with frequency of use with coping skills with continued practice in her daily activity.    Therapist assigned client homework to practice self-care    Plan: Return again in 4 weeks.  Diagnosis: Severe episode of recurrent major depressive disorder, without psychotic features  Collaboration of Care: Patient refused AEB no other needs requested by the client at this time.  Patient/Guardian was advised Release of Information must be obtained prior to any record release in order to collaborate their care with an outside provider. Patient/Guardian was  advised if they have not already done so to contact the registration department to sign all necessary forms in order for [place] to release information regarding their care.   Consent: Patient/Guardian gives  verbal consent for treatment and assignment of benefits for services provided during this visit. Patient/Guardian expressed understanding and agreed to proceed.   [place], LCSW 08/27/2022

## 2022-08-30 NOTE — Plan of Care (Signed)
  Problem: Depression CCP Problem  1  Goal: LTG: Aarushi WILL SCORE LESS THAN 10 ON THE PATIENT HEALTH QUESTIONNAIRE (PHQ-9) Outcome: Progressing Goal: STG: Davin WILL COMPLETE AT LEAST 80% OF ASSIGNED HOMEWORK Outcome: Progressing Goal: STG: Desirey WILL IDENTIFY 3 COGNITIVE PATTERNS AND BELIEFS THAT SUPPORT DEPRESSION Outcome: Progressing   

## 2022-09-25 ENCOUNTER — Ambulatory Visit (INDEPENDENT_AMBULATORY_CARE_PROVIDER_SITE_OTHER): Payer: No Payment, Other | Admitting: Clinical

## 2022-09-25 DIAGNOSIS — F332 Major depressive disorder, recurrent severe without psychotic features: Secondary | ICD-10-CM

## 2022-09-25 DIAGNOSIS — F4310 Post-traumatic stress disorder, unspecified: Secondary | ICD-10-CM
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
## 2022-10-01 ENCOUNTER — Ambulatory Visit (INDEPENDENT_AMBULATORY_CARE_PROVIDER_SITE_OTHER): Payer: Commercial Indemnity | Admitting: Clinical

## 2022-10-01 DIAGNOSIS — F332 Major depressive disorder, recurrent severe without psychotic features: Secondary | ICD-10-CM

## 2022-10-01 NOTE — Progress Notes (Unsigned)
   THERAPIST PROGRESS NOTE  Session Time: 30 minutes  Participation Level: Active  Behavioral Response: CasualAlertEuthymic  Type of Therapy: Individual Therapy  Treatment Goals addressed: client will identify 3 cognitive patterns and beliefs that support depression  ProgressTowards Goals: Progressing  Interventions: CBT and Supportive  Summary:  [name] is a 36 y.o. female who presents for the scheduled appointment oriented x5, appropriately dressed, and friendly.  Client denies hallucinations and delusions. Client reported today she has been doing her best to maintain.  Client reported she is still sleeping in her car.  Client reported she maintains by finding a place where she can shower or stopping by a friend's place to take a shower and get something to eat.  Client reported she primarily eats once a day.  Client reported she does her best to address well because she does not want to look like what she is going through.  Client reported that helps to keep her in a positive headspace.  Client reported she has also been attending additional mental health support group through [facility]. Client reported that has been helping to her stay optimistic as well and find out about other resources. Client reported today she is going to a job fair for [employer] and is putting her application in for other jobs. Client reported if she can get some money it can help with getting a hotel room and food between time. Client reported once she is able to be stable her mindset will change for the better.  Evidence of progress towards goal:  Client reported she is doing 2 skills of problem solving for her needs of housing and employment and reframing negative thoughts.   Suicidal/Homicidal: Nowithout intent/plan  Therapist Response:  Therapist began the appointment asking the client how she has been doing since last seen. Therapist used CBT to engage using active listening and positive  emotional support. Therapist used CBT and ask the client about update on her living conditions and safety. Therapist used CBT to give the client time to discuss her efforts to remain optimistic while job searching and finding things to be grateful for daily. Therapist used CBT to positively reinforce the clients use of practicing gratitude. Therapist used CBT ask the client to identify her progress with frequency of use with coping skills with continued practice in her daily activity.    Therapist assigned the client homework to practice self care.    Plan: Return again in 3 weeks.  Diagnosis: severe episode of recurrent major depressive disorder, without psychotic features  Collaboration of Care: Other therapist arranged the client to speak with DSS social worker about enrolling her for Medicaid after today's appointment.  Patient/Guardian was advised Release of Information must be obtained prior to any record release in order to collaborate their care with an outside provider. Patient/Guardian was advised if they have not already done so to contact the registration department to sign all necessary forms in order for [place] to release information regarding their care.   Consent: Patient/Guardian gives verbal consent for treatment and assignment of benefits for services provided during this visit. Patient/Guardian expressed understanding and agreed to proceed.   [place], LCSW 10/01/2022

## 2022-10-02 NOTE — Plan of Care (Signed)
  Problem: Depression CCP Problem  1  Goal: LTG: [name] WILL SCORE LESS THAN 10 ON THE PATIENT HEALTH QUESTIONNAIRE (PHQ-9) Outcome: Progressing Goal: STG: [name] WILL COMPLETE AT LEAST 80% OF ASSIGNED HOMEWORK Outcome: Progressing Goal: STG: [name] WILL IDENTIFY 3 COGNITIVE PATTERNS AND BELIEFS THAT SUPPORT DEPRESSION Outcome: Progressing

## 2022-10-06 ENCOUNTER — Telehealth (HOSPITAL_COMMUNITY): Payer: Self-pay | Admitting: Clinical

## 2022-11-12 ENCOUNTER — Ambulatory Visit (HOSPITAL_COMMUNITY): Payer: Commercial Indemnity | Admitting: Clinical

## 2022-11-12 DIAGNOSIS — F332 Major depressive disorder, recurrent severe without psychotic features: Secondary | ICD-10-CM

## 2022-11-15 NOTE — Progress Notes (Signed)
   THERAPIST PROGRESS NOTE Virtual Visit via Video Note  I connected with [name] on 11/12/2022 at  1:00 PM EST by a video enabled telemedicine application and verified that I am speaking with the correct person using two identifiers.  Location: Patient: car Provider: office   I discussed the limitations of evaluation and management by telemedicine and the availability of in person appointments. The patient expressed understanding and agreed to proceed.   Follow Up Instructions: I discussed the assessment and treatment plan with the patient. The patient was provided an opportunity to ask questions and all were answered. The patient agreed with the plan and demonstrated an understanding of the instructions.   The patient was advised to call back or seek an in-person evaluation if the symptoms worsen or if the condition fails to improve as anticipated.   Session Time: 20 minutes  Participation Level: Active  Behavioral Response: CasualAlertDepressed  Type of Therapy: Individual Therapy  Treatment Goals addressed: client will identify 3 cognitive patterns and beliefs that support depression  ProgressTowards Goals: Progressing  Interventions: CBT and Supportive  Summary:  [name] is a 36 y.o. female who presents for the scheduled for the appointment, oriented times five, appropriately dressed, and friendly. Client denied hallucinations and delusions. Client reported she is trying to maintain the best she can. Client reported she has not yet heard from the social worker to discuss her medicaid application. Client reported she has been parking at a place down town that is safer. Client reported it is getting cold at night so sleeping in her car is difficult. Client reported she has not yet found another job. Client reported she does not have any friends house she could go to during the holiday to spend time with and/or have someone to help her shower. Client reported she  has accepted that she does not have the ideal support right now and tries to not focus on it. Evidence of progress towards goal:  client reported reframing negative thoughts 2x per week.   Suicidal/Homicidal: Nowithout intent/plan  Therapist Response:  Therapist began the appointment asking the client how she has been doing. Therapist used CBT to engage using active listening and positive emotional support. Therapist used CBT to engage asking the client about her ability to locate safety. Therapist used CBT to engage and discuss coping skills for depression during the holiday. Therapist used CBT ask the client to identify her progress with frequency of use with coping skills with continued practice in her daily activity.    Therapist assigned the client homework to practice reframing negative thought.    Plan: Return again in 3 weeks.  Diagnosis: severe episode of recurrent major depressive disorder, without psychotic features  Collaboration of Care: Patient refused AEB none requested by the client.  Patient/Guardian was advised Release of Information must be obtained prior to any record release in order to collaborate their care with an outside provider. Patient/Guardian was advised if they have not already done so to contact the registration department to sign all necessary forms in order for [place] to release information regarding their care.   Consent: Patient/Guardian gives verbal consent for treatment and assignment of benefits for services provided during this visit. Patient/Guardian expressed understanding and agreed to proceed.   [place], LCSW 11/12/2022

## 2022-11-25 ENCOUNTER — Encounter: Payer: Self-pay | Admitting: Hematology and Oncology

## 2022-12-03 ENCOUNTER — Encounter (HOSPITAL_BASED_OUTPATIENT_CLINIC_OR_DEPARTMENT_OTHER): Payer: Self-pay | Admitting: Urology

## 2022-12-03 ENCOUNTER — Emergency Department (HOSPITAL_BASED_OUTPATIENT_CLINIC_OR_DEPARTMENT_OTHER)
Admission: EM | Admit: 2022-12-03 | Discharge: 2022-12-03 | Discharge status: Against Medical Advice | Payer: Commercial Managed Care - HMO | Attending: Emergency Medicine | Admitting: Emergency Medicine

## 2022-12-03 ENCOUNTER — Encounter: Payer: Self-pay | Admitting: Hematology and Oncology

## 2022-12-03 DIAGNOSIS — R000 Tachycardia, unspecified: Secondary | ICD-10-CM | POA: Diagnosis not present

## 2022-12-03 DIAGNOSIS — R202 Paresthesia of skin: Secondary | ICD-10-CM | POA: Diagnosis not present

## 2022-12-03 DIAGNOSIS — Z5321 Procedure and treatment not carried out due to patient leaving prior to being seen by health care provider: Secondary | ICD-10-CM | POA: Insufficient documentation

## 2022-12-03 DIAGNOSIS — R200 Anesthesia of skin: Secondary | ICD-10-CM | POA: Diagnosis not present

## 2022-12-03 LAB — BASIC METABOLIC PANEL
Anion gap: 9 (ref 5–15)
BUN: 10 mg/dL (ref 6–20)
CO2: 23 mmol/L (ref 22–32)
Calcium: 9 mg/dL (ref 8.9–10.3)
Chloride: 103 mmol/L (ref 98–111)
Creatinine, Ser: 0.68 mg/dL (ref 0.44–1.00)
GFR, Estimated: >60 mL/min (ref >60)
Glucose, Bld: 98 mg/dL (ref 70–99)
Potassium: 3.7 mmol/L (ref 3.5–5.1)
Sodium: 135 mmol/L (ref 135–145)

## 2022-12-03 LAB — MAGNESIUM: Magnesium: 1.9 mg/dL (ref 1.7–2.4)

## 2022-12-03 LAB — CBC
HCT: 37.2 % (ref 36.0–46.0)
Hemoglobin: 13.2 g/dL (ref 12.0–15.0)
MCH: 31.1 pg (ref 26.0–34.0)
MCHC: 35.5 g/dL (ref 30.0–36.0)
MCV: 87.7 fL (ref 80.0–100.0)
Platelets: 209 10*3/uL (ref 150–400)
RBC: 4.24 MIL/uL (ref 3.87–5.11)
RDW: 11.7 % (ref 11.5–15.5)
WBC: 4.6 10*3/uL (ref 4.0–10.5)
nRBC: 0 % (ref 0.0–0.2)

## 2022-12-03 LAB — TSH: TSH: 1.529 u[IU]/mL (ref 0.350–4.500)

## 2022-12-03 LAB — TROPONIN I (HIGH SENSITIVITY): Troponin I (High Sensitivity): <2 ng/L (ref <18)

## 2022-12-03 NOTE — ED Notes (Signed)
This RN went into to reassess/check on the patient but she nor her friend or any of their belongings were seen in the room. I then asked staff at the registration desk if the patient had left and they said yes the patient left.

## 2022-12-03 NOTE — ED Provider Notes (Signed)
Pt eloped prior to my evaluation.    Lyndel Safe 12/03/22 1925    Wyvonnia Dusky, MD 12/03/22 2045

## 2022-12-03 NOTE — ED Provider Triage Note (Addendum)
Emergency Medicine Provider Triage Evaluation Note  [name] , a 37 y.o. female  was evaluated in triage.  Pt complains of numbness in left leg since yesterday. Thought it was muscle related so she took a muscle relaxer. States that afterward her left hand also began having numbness. Both are still present today. Also describes having bad palpitations today. Denies chest pain. She also describes having episodes of lightheadedness and change in vision. Seen by neurology and they wanted her to go to [facility] ER which she went to on Saturday. Had MRI of head which was negative. States there was "an issue with the heart." To see [facility] on Monday. Denies speech changes or confusion. Denies recent trauma to the head.   Review of Systems  Positive: See above Negative:   Physical Exam  BP (!) 134/92 (BP Location: Left Arm)   Pulse (!) 132   Temp 98.2 F (36.8 C)   Resp 20   Ht '5\' 10"'$  (1.778 m)   Wt 63.5 kg   LMP 11/20/2022 (Approximate)   SpO2 100%   BMI 20.09 kg/m  Gen:   Awake, no distress   Resp:  Normal effort  MSK:   Moves extremities without difficulty  Other:  Decreased strength to LUE/LLE. Subjective sensation changes to both. No cranial nerve deficits.   Medical Decision Making  Medically screening exam initiated at 4:15 PM.  Appropriate orders placed.  [name] was informed that the remainder of the evaluation will be completed by another provider, this initial triage assessment does not replace that evaluation, and the importance of remaining in the ED until their evaluation is complete.  HR elevated on arrival. CT head 4 days ago was normal.    Mickie Hillier, PA-C 12/03/22 1619    Mickie Hillier, PA-C 12/03/22 1620    Mickie Hillier, PA-C 12/03/22 1620

## 2022-12-03 NOTE — ED Triage Notes (Signed)
Left leg numbness with throbbing sensation that started yesterday, ambulatory to triage, states pain with weight bearing Also states left hand tingling   Was seen at [facility] ER for same concerns Has Cardiology appointment Monday  Been having vision issues recently, seen by neurology

## 2022-12-03 NOTE — ED Notes (Addendum)
This patient informed registration they wanted to speak with someone about her wait in the lobby. This RN is charge nurse so went out to speak with patient. States "I have been here the longest out of everyone and everyone keeps going before me". Pt informed of the process of ED and how all the rooms in the back are full and how we utilize FT for patients who do not need further workup, and how it is acuity based and certain patients need to go back before others. Informed her that we are waiting on a bed for her to be evaluated and will go back as soon as possible. Pt continues to interrupt me stating she hasn't been seen and could be dying or having a stroke. Pt informed that she saw PA for MSE and her labs were reviewed by the PA and had some testing added on. States her "heart is beating so fast", took pulse manually by radial pulse, HR 90. Again apologized to patient and informed here that some critical patients had to be rushed back right away, taking the room that was available, patient states "well am I not critical?". Pt informed that we are trying to get her back asap and that some people are more critical than others based on labwork and vital signs and that right now her vital signs are stable. Informed her that she needs to see a provider in a room to be able to get a diagnosis. Pt threatening to leave. Provided patient with Counsellor information and phone number. Pt threatening this nurse stating "if something happens to me, its going to be your fault, nobody is doing anything for me". Again apologized to patient.

## 2022-12-03 NOTE — ED Notes (Signed)
Pt brought back to FT1, placed on continuous cardiac monitor, pulse ox and BOP cuff. Pt's friend at bedside.

## 2022-12-17 ENCOUNTER — Telehealth (HOSPITAL_COMMUNITY): Payer: Self-pay

## 2022-12-17 NOTE — Telephone Encounter (Signed)
Pt called stating the Arby Barrette called her and she was just returning a call. She asks that Arby Barrette return her call.

## 2023-01-07 ENCOUNTER — Ambulatory Visit (HOSPITAL_COMMUNITY): Payer: Commercial Indemnity | Admitting: Clinical

## 2023-01-07 ENCOUNTER — Telehealth (HOSPITAL_COMMUNITY): Payer: Self-pay | Admitting: Clinical

## 2023-01-07 ENCOUNTER — Encounter: Payer: Self-pay | Admitting: Hematology and Oncology

## 2023-01-07 DIAGNOSIS — F332 Major depressive disorder, recurrent severe without psychotic features: Secondary | ICD-10-CM
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
## 2023-01-28 ENCOUNTER — Ambulatory Visit (HOSPITAL_COMMUNITY): Payer: Medicaid Other | Admitting: Clinical

## 2023-01-28 ENCOUNTER — Encounter: Payer: Self-pay | Admitting: Hematology and Oncology

## 2023-02-10 ENCOUNTER — Telehealth (HOSPITAL_COMMUNITY): Payer: Self-pay

## 2023-02-10 NOTE — Telephone Encounter (Signed)
Pt called asking to speak with [facility]. Pt states that she was supposed to have a virtual appt last week and was calling to reschedule her appt. Pt scheduled for April Appt and will still try and  come in as a walk in.

## 2023-02-18 ENCOUNTER — Encounter (HOSPITAL_COMMUNITY): Payer: Medicaid Other | Admitting: Physician Assistant

## 2023-03-05 ENCOUNTER — Ambulatory Visit (INDEPENDENT_AMBULATORY_CARE_PROVIDER_SITE_OTHER): Payer: Medicaid Other | Admitting: Clinical

## 2023-03-05 ENCOUNTER — Encounter: Payer: Self-pay | Admitting: Hematology and Oncology

## 2023-03-05 DIAGNOSIS — F332 Major depressive disorder, recurrent severe without psychotic features: Secondary | ICD-10-CM | POA: Diagnosis not present

## 2023-03-08 NOTE — Progress Notes (Signed)
THERAPIST PROGRESS NOTE Virtual Visit via Video Note  I connected with [name] on 03/05/2023 at 10:00 AM EDT by a video enabled telemedicine application and verified that I am speaking with the correct person using two identifiers.  Location: Patient: home Provider: office   I discussed the limitations of evaluation and management by telemedicine and the availability of in person appointments. The patient expressed understanding and agreed to proceed.   Follow Up Instructions: I discussed the assessment and treatment plan with the patient. The patient was provided an opportunity to ask questions and all were answered. The patient agreed with the plan and demonstrated an understanding of the instructions.   The patient was advised to call back or seek an in-person evaluation if the symptoms worsen or if the condition fails to improve as anticipated.   Session Time: 40 minutes  Participation Level: Active  Behavioral Response: CasualAlertDepressed  Type of Therapy: Individual Therapy  Treatment Goals addressed: client will identify 3 cognitive patterns and beliefs that support depression  ProgressTowards Goals: Progressing  Interventions: CBT and Supportive  Summary:  [name] is a 37 y.o. female who presents for the scheduled appointment oriented times five, appropriately dressed, and friendly. Client denied hallucinations and delusions. Client reported on today she has had a mix of emotions. Client reported she has gone to all of her medical appointments. Client reported in 2020 when she was tested for stomacah problems she was diagnosed with gastritis. Client reported she was not aware that they diagnosed her with that and never discussed follow up treatment. Client reported issues with ability to digest food and bloat to extreme discomfort. Client reported in February she went to a neurologist for headaches , migraine and mobility issues. Client reported also  going to her other medical doctor it was determined she was malnourished which contributes to the severity of her ailments. Client reported her being malnourished is now linked to her updated diagnosis of gastroparesis. Client reported her stomach swells and and it is hard for her to breathe. Client reported they scheduled her for a stomach emptying test in August. Client reported she does not feel like she can wait that long. Client reported she has thought about going to the ER to see if that would help speed up the process. Client reported she has been talking to her sister. Client reported her nephew whom she helped raise was having some problems and he reached out to her to go visit him. Client reported she has been feeling depressed about going around him because she does not want him to see how much she has been struggling. Evidence of progress towards goal:  Client reported 1 negative belief about herself that contributes to depression.   Suicidal/Homicidal: Nowithout intent/plan  Therapist Response:  Therapist began the appointment asking the client how she has been doing since last seen. Therapist used CBT to engage using active listening and positive emotional support. Therapist used CBT to ask the client about her health issues and the progress coming from her attended doctors appointments.  Therapist used CBT to ask the client about how she is managing her current family dynamic. Therapist used CBT to normalize the clients emotional response and discuss reframing negative thoughts about herself. Therapist used CBT ask the client to identify her progress with frequency of use with coping skills with continued practice in her daily activity.    Therapist assigned the client homework to practice self care.   Plan: Return again in  3 weeks.  Diagnosis: severe episode of recurrent major depressive disorder, without psychotic features  Collaboration of Care: Other therapist is going to  contact with community resources for peer supports.  Patient/Guardian was advised Release of Information must be obtained prior to any record release in order to collaborate their care with an outside provider. Patient/Guardian was advised if they have not already done so to contact the registration department to sign all necessary forms in order for [place] to release information regarding their care.   Consent: Patient/Guardian gives verbal consent for treatment and assignment of benefits for services provided during this visit. Patient/Guardian expressed understanding and agreed to proceed.   [name] Maria [place], LCSW 03/05/2023

## 2023-03-23 ENCOUNTER — Telehealth (HOSPITAL_COMMUNITY): Payer: Self-pay | Admitting: Clinical

## 2023-04-02 ENCOUNTER — Ambulatory Visit (HOSPITAL_COMMUNITY): Payer: Medicaid Other | Admitting: Clinical

## 2023-04-02 ENCOUNTER — Encounter (HOSPITAL_COMMUNITY): Payer: Self-pay

## 2023-04-03 ENCOUNTER — Telehealth (HOSPITAL_COMMUNITY): Payer: Self-pay | Admitting: Clinical

## 2023-04-03 NOTE — Telephone Encounter (Signed)
Therapist was able to make contact with the client at the appt time via phone. Client asked if the therapist would write her a letter to help reinstate her food stamps. Client reported no concerns at this time.

## 2023-04-30 ENCOUNTER — Ambulatory Visit (HOSPITAL_COMMUNITY): Payer: Medicaid Other | Admitting: Clinical

## 2023-07-06 ENCOUNTER — Ambulatory Visit (HOSPITAL_COMMUNITY): Payer: Medicaid Other | Admitting: Clinical

## 2023-07-14 ENCOUNTER — Encounter (HOSPITAL_COMMUNITY): Payer: Self-pay

## 2023-07-14 ENCOUNTER — Encounter: Payer: Self-pay | Admitting: Emergency Medicine

## 2023-07-14 ENCOUNTER — Other Ambulatory Visit: Payer: Self-pay

## 2023-07-14 ENCOUNTER — Ambulatory Visit (HOSPITAL_COMMUNITY)
Admission: EM | Admit: 2023-07-14 | Discharge: 2023-07-14 | Disposition: A | Discharge status: Home / Self Care | Payer: Medicaid Other

## 2023-07-14 ENCOUNTER — Ambulatory Visit: Admission: EM | Admit: 2023-07-14 | Discharge: 2023-07-14 | Payer: Medicaid Other

## 2023-07-14 ENCOUNTER — Emergency Department (HOSPITAL_COMMUNITY)
Admission: EM | Admit: 2023-07-14 | Discharge: 2023-07-15 | Discharge status: Against Medical Advice | Payer: Medicaid Other | Attending: Emergency Medicine | Admitting: Emergency Medicine

## 2023-07-14 DIAGNOSIS — R0981 Nasal congestion: Secondary | ICD-10-CM | POA: Diagnosis not present

## 2023-07-14 DIAGNOSIS — R079 Chest pain, unspecified: Secondary | ICD-10-CM | POA: Insufficient documentation

## 2023-07-14 DIAGNOSIS — R509 Fever, unspecified: Secondary | ICD-10-CM | POA: Diagnosis not present

## 2023-07-14 DIAGNOSIS — R0602 Shortness of breath: Secondary | ICD-10-CM

## 2023-07-14 DIAGNOSIS — Z20822 Contact with and (suspected) exposure to covid-19: Secondary | ICD-10-CM | POA: Insufficient documentation

## 2023-07-14 DIAGNOSIS — R059 Cough, unspecified: Secondary | ICD-10-CM | POA: Diagnosis not present

## 2023-07-14 DIAGNOSIS — R630 Anorexia: Secondary | ICD-10-CM | POA: Diagnosis not present

## 2023-07-14 DIAGNOSIS — Z5321 Procedure and treatment not carried out due to patient leaving prior to being seen by health care provider: Secondary | ICD-10-CM | POA: Diagnosis not present

## 2023-07-14 DIAGNOSIS — R1010 Upper abdominal pain, unspecified: Secondary | ICD-10-CM

## 2023-07-14 NOTE — ED Provider Notes (Signed)
Renaldo Fiddler    CSN: [id number] Arrival date & time: 07/14/23  1524      History   Chief Complaint Chief Complaint  Patient presents with   Abdominal Pain   Cough   Shortness of Breath    HPI [name] is a 37 y.o. female.  Patient presents to the ED with shortness of breath, cough, upper abdominal pain since she had a endoscopy with biopsy done on 07/10/2023 at [facility].  Her symptoms are worse today.  She denies fever or chest pain.  The history is provided by the patient and medical records.    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   Hypertension Dx 20015   Lumbar radicular pain 11/22/2014   Pain disorder 08/29/2017   Panic attack Dx 2006   Positive depression screening 08/29/2017   PTSD (post-traumatic stress disorder) Dx 2013   Seizures (HCC) 2001   Vitamin D deficiency 01/24/2015    Patient Active Problem List   Diagnosis Date Noted   Side effect of medication 03/25/2022   Generalized anxiety disorder 06/11/2021   PTSD (post-traumatic stress disorder) 06/11/2021   Severe episode of recurrent major depressive disorder, 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/11/2021   Iron deficiency anemia due to chronic blood loss 11/22/2018   Pelvic pain in female 11/08/2018   Hypertension 08/30/2018   DUB (dysfunctional uterine bleeding) 06/17/2018   Fibroid uterus 01/18/2018   Cervical radiculopathy 01/01/2018   Pain disorder 08/29/2017   Neck pain without injury 08/24/2017   Chronic fatigue 05/15/2017   Common migraine 04/15/2017   Axillary mass, right 04/09/2017   History of bulimia nervosa 03/13/2015   GERD (gastroesophageal reflux  disease) 03/13/2015   SOB (shortness of breath) 03/13/2015   H/O vitamin D deficiency 03/13/2015   Health care maintenance 01/25/2015   Chronic tension type headache 01/25/2015   Muscle pain, fibromyalgia 01/24/2015   Allergic rhinitis 01/24/2015   Lumbar radicular pain 11/22/2014   Thoracic neuralgia 11/22/2014   Back pain    Abdominal pain 06/06/2012   Chronic pain 06/06/2012   Depression 06/06/2012   HEMOPTYSIS UNSPECIFIED 12/14/2009   BREAST TENDERNESS 11/08/2009   NIPPLE DISCHARGE 11/08/2009   MENORRHAGIA 11/08/2009   DISORDER, BIPOLAR NOS 08/06/2007   PERSONALITY DISORDER 08/06/2007   MARIJUANA ABUSE 08/06/2007   NARCOTIC ABUSE 08/06/2007   SEIZURE DISORDER 08/06/2007   SOMATIZATION DISORDER 12/27/2005    Past Surgical History:  Procedure Laterality Date   BREAST BIOPSY Left    ESOPHAGOGASTRODUODENOSCOPY N/A 08/14/2015   Procedure: ESOPHAGOGASTRODUODENOSCOPY (EGD);  Surgeon: Jeani Hawking, MD;  Location: [facility];  Service: Endoscopy;  Laterality: N/A;    OB History     Gravida  1   Para  0   Term  0   Preterm  0   AB  1   Living  0      SAB  1   IAB  0   Ectopic  0   Multiple  0   Live Births  0            Home Medications  Prior to Admission medications   Medication Sig Start Date End Date Taking? Authorizing Provider  busPIRone (BUSPAR) 7.5 MG tablet Take 1 tablet (7.5 mg total) by mouth 2 (two) times daily. 03/25/22   Nwoko, Tommas Olp, PA  famotidine (PEPCID) 40 MG tablet Take 40 mg by mouth 2 (two) times daily. 06/21/20   [provider]  gabapentin (NEURONTIN) 100 MG capsule Take 1 capsule (100 mg total) by mouth 3 (three) times daily. 06/11/21 06/11/22  Nwoko, Tommas Olp, PA  lidocaine (XYLOCAINE) 5 % ointment Apply 1 application topically every evening. 04/24/20   [provider]  Multiple Vitamin (MULTIVITAMIN) tablet Take 1 tablet by mouth daily.    [provider]  RABEprazole (ACIPHEX) 20 MG tablet Take  1 tablet by mouth daily as needed. 06/21/20 06/21/21  [provider]  vortioxetine HBr (TRINTELLIX) 10 MG TABS tablet Take 1 tablet (10 mg total) by mouth daily. 03/25/22   Meta Hatchet, PA    Family History Family History  Problem Relation Age of Onset   Heart attack Mother    Heart disease Mother    Sudden death Mother    Brain cancer Mother    Sudden death Sister    Heart attack Sister    Sudden death Maternal Grandmother    Fainting Maternal Grandmother    Heart attack Maternal Grandmother    Breast cancer Paternal Grandmother    Breast cancer Cousin    Breast cancer Paternal Aunt    Seizures Maternal Aunt     Social History Social History   Tobacco Use   Smoking status: Former    Current packs/day: 0.00    Types: Cigarettes    Quit date: 06/23/2012    Years since quitting: 11.0   Smokeless tobacco: Never  Vaping Use   Vaping status: Never Used  Substance Use Topics   Alcohol use: Never   Drug use: No     Allergies   Aspirin, Other, Adhesive [tape], and Contrast media [iodinated contrast media]   Review of Systems Review of Systems  Constitutional:  Negative for chills and fever.  Respiratory:  Positive for cough and shortness of breath.   Gastrointestinal:  Positive for abdominal pain and nausea. Negative for vomiting.     Physical Exam Triage Vital Signs ED Triage Vitals  Encounter Vitals Group     BP      Systolic BP Percentile      Diastolic BP Percentile      Pulse      Resp      Temp      Temp src      SpO2      Weight      Height      Head Circumference      Peak Flow      Pain Score      Pain Loc      Pain Education      Exclude from Growth Chart    No data found.  Updated Vital Signs LMP 07/01/2023   Visual Acuity Right Eye Distance:   Left Eye Distance:   Bilateral Distance:    Right Eye Near:   Left Eye Near:    Bilateral Near:     Physical Exam Constitutional:      Appearance: She is ill-appearing.   HENT:     Mouth/Throat:     Mouth: Mucous membranes are moist.  Cardiovascular:     Rate and Rhythm: Normal rate and regular rhythm.  Heart sounds: Normal heart sounds.  Pulmonary:     Effort: Pulmonary effort is normal. No respiratory distress.     Breath sounds: Normal breath sounds.  Abdominal:     Palpations: Abdomen is soft.     Tenderness: There is abdominal tenderness in the right upper quadrant and left upper quadrant. There is no guarding or rebound.  Neurological:     Mental Status: She is alert.      UC Treatments / Results  Labs (all labs ordered are listed, but only abnormal results are displayed) Labs Reviewed - No data to display  EKG   Radiology No results found.  Procedures Procedures (including critical care time)  Medications Ordered in UC Medications - No data to display  Initial Impression / Assessment and Plan / UC Course  I have reviewed the triage vital signs and the nursing notes.  Pertinent labs & imaging results that were available during my care of the patient were reviewed by me and considered in my medical decision making (see chart for details).    Shortness of breath and upper abdominal pain following a procedure done at [facility] on 07/10/2023.  The patient presents to the urgent care with worsening shortness of breath and upper abdominal pain today.  A quick triage is performed with brief exam and sending patient to the ED.  She is agreeable to this.  She is accompanied by a friend who will drive her to the ED.  She declines EMS.  Final Clinical Impressions(s) / UC Diagnoses   Final diagnoses:  Shortness of breath  Pain of upper abdomen     Discharge Instructions      Go to the emergency department for evaluation of your shortness of breath and abdominal pain.      ED Prescriptions   None    PDMP not reviewed this encounter.   Mickie Bail, NP 07/14/23 1538

## 2023-07-14 NOTE — ED Triage Notes (Signed)
Pt presents with SOB, cough and abdominal pain x 4 days. Pt reports that 4 days ago she had an endoscopy performed at [facility] and a Bravo camera was placed. Since the camera has been placed she developed the above symptoms. Pt states the her symptoms are getting worse.

## 2023-07-14 NOTE — ED Triage Notes (Signed)
Bravo procedure on 07/10/2023 pt states that she is having cp, sob, and cough since having the procedure done.

## 2023-07-14 NOTE — ED Notes (Signed)
Patient called for room placement x 1.  No answer at this time

## 2023-07-14 NOTE — Discharge Instructions (Signed)
Go to the emergency department for evaluation of your shortness of breath and abdominal pain.

## 2023-07-14 NOTE — ED Notes (Signed)
Patient is being discharged from the Urgent Care and sent to the Emergency Department via personal vehicle . Per Wendee Beavers NP, patient is in need of higher level of care due to SOB and abdominal pain after endoscopy. Patient is aware and verbalizes understanding of plan of care.  Vitals:   07/14/23 1540  BP: 130/85  Pulse: (!) 102  Resp: 18  Temp: 98.2 F (36.8 C)  SpO2: 98%

## 2023-07-15 ENCOUNTER — Emergency Department (HOSPITAL_BASED_OUTPATIENT_CLINIC_OR_DEPARTMENT_OTHER): Payer: Medicaid Other

## 2023-07-15 ENCOUNTER — Other Ambulatory Visit: Payer: Self-pay

## 2023-07-15 ENCOUNTER — Encounter (HOSPITAL_BASED_OUTPATIENT_CLINIC_OR_DEPARTMENT_OTHER): Payer: Self-pay | Admitting: Emergency Medicine

## 2023-07-15 ENCOUNTER — Emergency Department (HOSPITAL_BASED_OUTPATIENT_CLINIC_OR_DEPARTMENT_OTHER)
Admission: EM | Admit: 2023-07-15 | Discharge: 2023-07-15 | Disposition: A | Discharge status: Home / Self Care | Payer: Medicaid Other | Source: Home / Self Care | Attending: Emergency Medicine | Admitting: Emergency Medicine

## 2023-07-15 DIAGNOSIS — R0981 Nasal congestion: Secondary | ICD-10-CM | POA: Insufficient documentation

## 2023-07-15 DIAGNOSIS — R059 Cough, unspecified: Secondary | ICD-10-CM | POA: Insufficient documentation

## 2023-07-15 DIAGNOSIS — R630 Anorexia: Secondary | ICD-10-CM | POA: Insufficient documentation

## 2023-07-15 DIAGNOSIS — Z20822 Contact with and (suspected) exposure to covid-19: Secondary | ICD-10-CM | POA: Insufficient documentation

## 2023-07-15 DIAGNOSIS — J40 Bronchitis, not specified as acute or chronic: Secondary | ICD-10-CM

## 2023-07-15 DIAGNOSIS — R52 Pain, unspecified: Secondary | ICD-10-CM | POA: Insufficient documentation

## 2023-07-15 DIAGNOSIS — R509 Fever, unspecified: Secondary | ICD-10-CM | POA: Insufficient documentation

## 2023-07-15 HISTORY — DX: Gastroparesis: K31.84

## 2023-07-15 LAB — RESP PANEL BY RT-PCR (RSV, FLU A&B, COVID)  RVPGX2
Influenza A by PCR: NEGATIVE
Influenza B by PCR: NEGATIVE
Resp Syncytial Virus by PCR: NEGATIVE
SARS Coronavirus 2 by RT PCR: NEGATIVE

## 2023-07-15 LAB — PREGNANCY, URINE: Preg Test, Ur: NEGATIVE

## 2023-07-15 MED ORDER — DOXYCYCLINE HYCLATE 100 MG PO CAPS: 100.0000 mg | ORAL_CAPSULE | Freq: Two times a day (BID) | ORAL | 0 refills | Status: DC

## 2023-07-15 MED ORDER — ALBUTEROL SULFATE HFA 108 (90 BASE) MCG/ACT IN AERS: 1.0000 | INHALATION_SPRAY | Freq: Four times a day (QID) | RESPIRATORY_TRACT | 0 refills | Status: AC | PRN

## 2023-07-15 MED ORDER — METHYLPREDNISOLONE 4 MG PO TBPK: ORAL_TABLET | ORAL | 0 refills | Status: DC

## 2023-07-15 MED ORDER — ALBUTEROL SULFATE (2.5 MG/3ML) 0.083% IN NEBU
2.5000 mg | INHALATION_SOLUTION | Freq: Once | RESPIRATORY_TRACT | Status: AC
  Administered 2023-07-15: 2.5 mg via RESPIRATORY_TRACT
  Filled 2023-07-15: qty 3

## 2023-07-15 NOTE — ED Provider Notes (Signed)
[facility] Provider Note   CSN: [id number] Arrival date & time: 07/15/23  4098     History  Chief Complaint  Patient presents with   URI    [name] is a 37 y.o. female.  HPI   37 year old female presents emergency department with productive cough, nasal congestion and bodyaches. This has been ongoing for about 10 days. She also endorses fever and chills with decreased appetite.  Of note patient recently had endoscopy for difficulty swallowing, nausea/vomiting and weight loss.  She is being followed up as an outpatient with GI.  The symptoms persist but are not acutely worsened.  She is now having productive cough of clear/yellow phlegm with fever.  She at times feels short of breath but denies any acute chest pain.  No swelling of her lower extremities.  She has had recent sick contacts with multiple coworkers.      Home Medications Prior to Admission medications   Medication Sig Start Date End Date Taking? Authorizing Provider  busPIRone (BUSPAR) 7.5 MG tablet Take 1 tablet (7.5 mg total) by mouth 2 (two) times daily. 03/25/22   Nwoko, Tommas Olp, PA  famotidine (PEPCID) 40 MG tablet Take 40 mg by mouth 2 (two) times daily. 06/21/20   [provider]  gabapentin (NEURONTIN) 100 MG capsule Take 1 capsule (100 mg total) by mouth 3 (three) times daily. 06/11/21 06/11/22  Nwoko, Tommas Olp, PA  lidocaine (XYLOCAINE) 5 % ointment Apply 1 application topically every evening. 04/24/20   [provider]  Multiple Vitamin (MULTIVITAMIN) tablet Take 1 tablet by mouth daily.    [provider]  RABEprazole (ACIPHEX) 20 MG tablet Take 1 tablet by mouth daily as needed. 06/21/20 06/21/21  [provider]  vortioxetine HBr (TRINTELLIX) 10 MG TABS tablet Take 1 tablet (10 mg total) by mouth daily. 03/25/22   Nwoko, Tommas Olp, PA      Allergies    Aspirin, Other, Adhesive [tape], and Contrast media [iodinated contrast  media]    Review of Systems   Review of Systems  Constitutional:  Positive for chills, fatigue and fever.  Respiratory:  Positive for cough and wheezing. Negative for shortness of breath.   Cardiovascular:  Negative for chest pain, palpitations and leg swelling.  Gastrointestinal:  Negative for abdominal pain, diarrhea and vomiting.  Genitourinary:  Negative for flank pain.  Musculoskeletal:  Negative for back pain.  Skin:  Negative for rash.  Neurological:  Negative for headaches.    Physical Exam Updated Vital Signs BP 124/78 (BP Location: Left Arm)   Pulse (!) 105   Temp 98 F (36.7 C) (Oral)   Resp (!) 22   Ht 5\' 10"  (1.778 m)   Wt 59.4 kg   LMP 07/01/2023   SpO2 100%   BMI 18.80 kg/m  Physical Exam Vitals and nursing note reviewed.  Constitutional:      Appearance: Normal appearance.  HENT:     Head: Normocephalic.     Mouth/Throat:     Mouth: Mucous membranes are moist.  Cardiovascular:     Rate and Rhythm: Normal rate.  Pulmonary:     Effort: Pulmonary effort is normal. No respiratory distress.     Breath sounds: Wheezing present. No rales.  Abdominal:     Palpations: Abdomen is soft.     Tenderness: There is no abdominal tenderness.  Musculoskeletal:        General: No swelling.  Skin:    General:  Skin is warm.  Neurological:     Mental Status: She is alert and oriented to person, place, and time. Mental status is at baseline.  Psychiatric:        Mood and Affect: Mood normal.     ED Results / Procedures / Treatments   Labs (all labs ordered are listed, but only abnormal results are displayed) Labs Reviewed  RESP PANEL BY RT-PCR (RSV, FLU A&B, COVID)  RVPGX2  PREGNANCY, URINE    EKG None  Radiology No results found.  Procedures Procedures    Medications Ordered in ED Medications  albuterol (PROVENTIL) (2.5 MG/3ML) 0.083% nebulizer solution 2.5 mg (2.5 mg Nebulization Given 07/15/23 0810)    ED Course/ Medical Decision Making/ A&P                                  Medical Decision Making Amount and/or Complexity of Data Reviewed Labs: ordered. Radiology: ordered.  Risk Prescription drug management.   37 year old female presents emergency department with concern for productive cough, fever/chills, body aches.  She is afebrile on arrival.  Has productive cough on exam with scattered wheezing.  Respiratory panel is negative.  Chest x-ray shows no focal pneumonia.  After breathing treatment patient feels improved.  Given the duration of symptoms and her presentation we will treat for bronchitis.  Low suspicion for PE at this time given the infectious presentation with reported fever.  Patient at this time appears safe and stable for discharge and close outpatient follow up. Discharge plan and strict return to ED precautions discussed, patient verbalizes understanding and agreement.        Final Clinical Impression(s) / ED Diagnoses Final diagnoses:  None    Rx / DC Orders ED Discharge Orders     None         Rozelle Logan, DO 07/15/23 1009

## 2023-07-15 NOTE — ED Triage Notes (Signed)
Pt having URI symptoms:  runny nose, productive cough, sore throat, achy all over, fever since Friday.

## 2023-07-15 NOTE — Discharge Instructions (Signed)
You have been seen and discharged from the emergency department.  You have been diagnosed with bronchitis.  Take antibiotic as directed.  Use inhaler and steroids as prescribed.  Stay well-hydrated.  You may use Tylenol/ibuprofen as needed for pain control.  Follow-up with your primary provider for further evaluation and further care. Take home medications as prescribed. If you have any worsening symptoms or further concerns for your health please return to an emergency department for further evaluation.

## 2023-07-15 NOTE — ED Notes (Signed)
ED Provider at bedside. 

## 2023-08-06 ENCOUNTER — Ambulatory Visit (INDEPENDENT_AMBULATORY_CARE_PROVIDER_SITE_OTHER): Payer: Medicaid Other | Admitting: Clinical

## 2023-08-06 DIAGNOSIS — F332 Major depressive disorder, recurrent severe without psychotic features: Secondary | ICD-10-CM | POA: Diagnosis not present

## 2023-08-06 NOTE — Progress Notes (Signed)
   THERAPIST PROGRESS NOTE Virtual Visit via Video Note  I connected with [name] on 08/06/2023 at  3:00 PM EDT by a video enabled telemedicine application and verified that I am speaking with the correct person using two identifiers.  Location: Patient: Set designer Provider: Office   I discussed the limitations of evaluation and management by telemedicine and the availability of in person appointments. The patient expressed understanding and agreed to proceed.   Follow Up Instructions: I discussed the assessment and treatment plan with the patient. The patient was provided an opportunity to ask questions and all were answered. The patient agreed with the plan and demonstrated an understanding of the instructions.   The patient was advised to call back or seek an in-person evaluation if the symptoms worsen or if the condition fails to improve as anticipated.   Session Time: 25 minutes  Participation Level: Active  Behavioral Response: CasualAlertDepressed  Type of Therapy: Individual Therapy  Treatment Goals addressed: Client will identify 3 cognitive patterns and believes that support depression  ProgressTowards Goals: Progressing  Interventions: CBT and Supportive  Summary:  [name] is a 37 y.o. female who presents for the scheduled appointment oriented x 5, appropriately dressed, and friendly.  Client denied hallucinations and delusions. Client reported on today she has been doing about the same. Client reported she has been staying in between sleeping in her car and infrequently staying at a friend's house.  Client reported social services did not reinstate her food stamps.  Client reported it has been hard for her to eat.  Client reported she is doing good to have at least 1 meal per day.  Client reported she has gone to follow-up medical appointments regarding her physical health symptoms.  Client reported she was diagnosed with a severe case of GERD and gastritis.   Client reported she has been really mindful of what she eats.  Client reported she finds herself going in and out of being triggered with posttrauma symptoms.  Client reported it can be hard to pull herself when she feels triggered. Therapist used CBT ask the client to identify her progress with frequency of use with coping skills with continued practice in her daily activity.       Plan: Return again in 4 weeks.  Diagnosis: Severe episode of recurrent major depressive disorder, without psychotic features  Collaboration of Care: Other therapist will speak with the in office social worker affiliated with the Department of Social Services to review about assistance with helping to reinstate her food stamps.  Therapist will follow-up with the client in a few days.  Patient/Guardian was advised Release of Information must be obtained prior to any record release in order to collaborate their care with an outside provider. Patient/Guardian was advised if they have not already done so to contact the registration department to sign all necessary forms in order for [place] to release information regarding their care.   Consent: Patient/Guardian gives verbal consent for treatment and assignment of benefits for services provided during this visit. Patient/Guardian expressed understanding and agreed to proceed.   Maria Rhymes [name], LCSW 08/06/2023

## 2023-08-19 ENCOUNTER — Encounter: Payer: Self-pay | Admitting: Hematology and Oncology

## 2023-08-26 ENCOUNTER — Telehealth (HOSPITAL_COMMUNITY): Payer: Self-pay | Admitting: Clinical

## 2023-08-27 ENCOUNTER — Ambulatory Visit (INDEPENDENT_AMBULATORY_CARE_PROVIDER_SITE_OTHER): Payer: Medicaid Other | Admitting: Clinical

## 2023-08-27 DIAGNOSIS — F332 Major depressive disorder, recurrent severe without psychotic features: Secondary | ICD-10-CM | POA: Diagnosis not present

## 2023-08-28 NOTE — Progress Notes (Signed)
THERAPIST PROGRESS NOTE Virtual Visit via Video Note Virtual Visit via Telephone Note  I connected with [name] on 08/27/2023 at  2:00 PM EDT by telephone and verified that I am speaking with the correct person using two identifiers.  Location: Patient: Parked car Provider: Office   I discussed the limitations, risks, security and privacy concerns of performing an evaluation and management service by telephone and the availability of in person appointments. I also discussed with the patient that there may be a patient responsible charge related to this service. The patient expressed understanding and agreed to proceed.   Follow Up Instructions: I discussed the assessment and treatment plan with the patient. The patient was provided an opportunity to ask questions and all were answered. The patient agreed with the plan and demonstrated an understanding of the instructions.   The patient was advised to call back or seek an in-person evaluation if the symptoms worsen or if the condition fails to improve as anticipated.   Session Time: 30 minutes  Participation Level: Active  Behavioral Response: NAAlertEuthymic  Type of Therapy: Individual Therapy  Treatment Goals addressed: Client will identify 3 cognitive patterns and beliefs that support depression  ProgressTowards Goals: Progressing  Interventions: CBT and Supportive  Summary:  [name] is a 37 y.o. female who presents to the appointment oriented x 5 and cooperative. client denied hallucinations and delusions Client reported on today she has been doing fairly okay.  Client reported she is still living in her car. Client reported she has been attending follow-up medical appointments pertaining her health concerns. Client reported she is a very grateful to have Medicaid because she is able to speak and work with doctors that she had not seen before.  Client reported she is receiving thorough reevaluation and  diagnoses for the symptoms she has been experiencing.  Client reported she has been working with the program to help her apply for disability.  Client reported she has been doing her best to maintain a positive outlook towards the future. Evidence of progress towards goal: Client reported 1 positive of reframing her thoughts to stay optimistic about her stressors.  Suicidal/Homicidal: Nowithout intent/plan  Therapist Response:  Therapist began the appointment asking the client how she has been doing since last seen. Therapist used CBT to engage using active listening and positive emotional support. Therapist used CBT to engage and asked the client about progress with health appointments and how she is processing all the information. Therapist used CBT to positively reinforce the clients connection with health and community resources to help improve her stressors. Therapist used CBT ask the client to identify her progress with frequency of use with coping skills with continued practice in her daily activity.    Therapist assigned client homework to practice self-care.   Plan: Return again in 4 weeks.  Diagnosis: Severe episode of recurrent major depressive disorder without psychotic features  Collaboration of Care: Other therapist informed the client she spoke with the in-house social worker regarding her case with food stamps.  Therapist informed the client she will need to reapply for food stamps.  Patient/Guardian was advised Release of Information must be obtained prior to any record release in order to collaborate their care with an outside provider. Patient/Guardian was advised if they have not already done so to contact the registration department to sign all necessary forms in order for [place] to release information regarding their care.   Consent: Patient/Guardian gives verbal consent for treatment and  assignment of benefits for services provided during this visit. Patient/Guardian  expressed understanding and agreed to proceed.   [name] Orlean Holtrop, LCSW 08/27/2023

## 2024-02-12 ENCOUNTER — Other Ambulatory Visit: Payer: Self-pay

## 2024-02-12 DIAGNOSIS — Z87891 Personal history of nicotine dependence: Secondary | ICD-10-CM | POA: Diagnosis not present

## 2024-02-12 DIAGNOSIS — I10 Essential (primary) hypertension: Secondary | ICD-10-CM | POA: Insufficient documentation

## 2024-02-12 DIAGNOSIS — R519 Headache, unspecified: Secondary | ICD-10-CM | POA: Diagnosis not present

## 2024-02-12 DIAGNOSIS — R0789 Other chest pain: Secondary | ICD-10-CM | POA: Insufficient documentation

## 2024-02-12 DIAGNOSIS — R079 Chest pain, unspecified: Secondary | ICD-10-CM | POA: Diagnosis present

## 2024-02-13 ENCOUNTER — Encounter (HOSPITAL_BASED_OUTPATIENT_CLINIC_OR_DEPARTMENT_OTHER): Payer: Self-pay

## 2024-02-13 ENCOUNTER — Other Ambulatory Visit: Payer: Self-pay

## 2024-02-13 ENCOUNTER — Emergency Department (HOSPITAL_BASED_OUTPATIENT_CLINIC_OR_DEPARTMENT_OTHER)

## 2024-02-13 ENCOUNTER — Emergency Department (HOSPITAL_BASED_OUTPATIENT_CLINIC_OR_DEPARTMENT_OTHER)
Admission: EM | Admit: 2024-02-13 | Discharge: 2024-02-13 | Disposition: A | Discharge status: Home / Self Care | Attending: Emergency Medicine | Admitting: Emergency Medicine

## 2024-02-13 DIAGNOSIS — R0789 Other chest pain: Secondary | ICD-10-CM

## 2024-02-13 DIAGNOSIS — R079 Chest pain, unspecified: Secondary | ICD-10-CM

## 2024-02-13 LAB — BASIC METABOLIC PANEL
Anion gap: 8 (ref 5–15)
BUN: 10 mg/dL (ref 6–20)
CO2: 25 mmol/L (ref 22–32)
Calcium: 9.1 mg/dL (ref 8.9–10.3)
Chloride: 105 mmol/L (ref 98–111)
Creatinine, Ser: 0.73 mg/dL (ref 0.44–1.00)
GFR, Estimated: >60 mL/min (ref >60)
Glucose, Bld: 88 mg/dL (ref 70–99)
Potassium: 3.8 mmol/L (ref 3.5–5.1)
Sodium: 138 mmol/L (ref 135–145)

## 2024-02-13 LAB — CBC
HCT: 33.7 % — ABNORMAL LOW (ref 36.0–46.0)
Hemoglobin: 12 g/dL (ref 12.0–15.0)
MCH: 31 pg (ref 26.0–34.0)
MCHC: 35.6 g/dL (ref 30.0–36.0)
MCV: 87.1 fL (ref 80.0–100.0)
Platelets: 249 10*3/uL (ref 150–400)
RBC: 3.87 MIL/uL (ref 3.87–5.11)
RDW: 12.1 % (ref 11.5–15.5)
WBC: 5.8 10*3/uL (ref 4.0–10.5)
nRBC: 0 % (ref 0.0–0.2)

## 2024-02-13 LAB — TROPONIN I (HIGH SENSITIVITY): Troponin I (High Sensitivity): 3 ng/L (ref <18)

## 2024-02-13 LAB — HCG, SERUM, QUALITATIVE: Preg, Serum: NEGATIVE

## 2024-02-13 MED ORDER — HYDROCODONE-ACETAMINOPHEN 5-325 MG PO TABS
1.0000 | ORAL_TABLET | Freq: Once | ORAL | Status: AC
  Administered 2024-02-13: 1 via ORAL
  Filled 2024-02-13: qty 1

## 2024-02-13 MED ORDER — ALUM & MAG HYDROXIDE-SIMETH 200-200-20 MG/5ML PO SUSP
30.0000 mL | Freq: Once | ORAL | Status: AC
  Administered 2024-02-13: 30 mL via ORAL
  Filled 2024-02-13: qty 30

## 2024-02-13 MED ORDER — CYCLOBENZAPRINE HCL 10 MG PO TABS: 10.0000 mg | ORAL_TABLET | Freq: Two times a day (BID) | ORAL | 0 refills | Status: DC | PRN

## 2024-02-13 MED ORDER — LIDOCAINE VISCOUS HCL 2 % MT SOLN
15.0000 mL | Freq: Once | OROMUCOSAL | Status: AC
Start: 2024-02-13 — End: 2024-02-13
  Administered 2024-02-13: 15 mL via ORAL
  Filled 2024-02-13: qty 15

## 2024-02-13 MED ORDER — LIDOCAINE 5 % EX PTCH: 1.0000 | MEDICATED_PATCH | Freq: Every day | CUTANEOUS | 0 refills | Status: DC | PRN

## 2024-02-13 NOTE — ED Notes (Signed)
 Discharge instructions reviewed.   Newly prescribed medications discussed. Pharmacy verified.   Opportunity for questions and concerns provided.   Alert, oriented and ambulatory. Displays no signs of distress.

## 2024-02-13 NOTE — ED Provider Notes (Signed)
 [place] [facility] Provider Note  CSN: [id number] Arrival date & time: 02/12/24 2337  Chief Complaint(s) Chest Pain  HPI [name] is a 38 y.o. female with past medical history as below, significant for migraine, fibromyalgia, gastroparesis, hypertension, PTSD, seizures who presents to the ED with complaint of chest pain  Patient reports she has been having chest pain since Thursday morning.  Tightness, pressure sensation midsternal, some radiation to her neck.  No nausea or vomiting.  No significant dyspnea.  No leg swelling, no recent travel or chest trauma.  Also has a mild headache.  Unable to identify alleviating or exacerbating factors.  No change with p.o. intake.  Similar symptoms in the past that were attributed to anxiety.  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 Hgb electrophoresis   Hypertension Dx 20015   Lumbar radicular pain 11/22/2014   Pain disorder 08/29/2017   Panic attack Dx 2006   Positive depression screening 08/29/2017   PTSD (post-traumatic stress disorder) Dx 2013   Seizures (HCC) 2001   Vitamin D deficiency 01/24/2015   Patient Active Problem List   Diagnosis Date Noted   Side effect of medication 03/25/2022   Generalized anxiety disorder 06/11/2021   PTSD (post-traumatic stress disorder) 06/11/2021   Severe episode of recurrent major depressive disorder, 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/11/2021   Iron deficiency anemia due to chronic blood loss 11/22/2018   Pelvic pain in female 11/08/2018   Hypertension 08/30/2018    DUB (dysfunctional uterine bleeding) 06/17/2018   Fibroid uterus 01/18/2018   Cervical radiculopathy 01/01/2018   Pain disorder 08/29/2017   Neck pain without injury 08/24/2017   Chronic fatigue 05/15/2017   Common migraine 04/15/2017   Axillary mass, right 04/09/2017   History of bulimia nervosa 03/13/2015   GERD (gastroesophageal reflux disease) 03/13/2015   SOB (shortness of breath) 03/13/2015   H/O vitamin D deficiency 03/13/2015   Health care maintenance 01/25/2015   Chronic tension type headache 01/25/2015   Muscle pain, fibromyalgia 01/24/2015   Allergic rhinitis 01/24/2015   Lumbar radicular pain 11/22/2014   Thoracic neuralgia 11/22/2014   Back pain    Abdominal pain 06/06/2012   Chronic pain 06/06/2012   Depression 06/06/2012   HEMOPTYSIS UNSPECIFIED 12/14/2009   BREAST TENDERNESS 11/08/2009   NIPPLE DISCHARGE 11/08/2009   MENORRHAGIA 11/08/2009   DISORDER, BIPOLAR NOS 08/06/2007   PERSONALITY DISORDER 08/06/2007   MARIJUANA ABUSE 08/06/2007   NARCOTIC ABUSE 08/06/2007   SEIZURE DISORDER 08/06/2007   SOMATIZATION DISORDER 12/27/2005   Home Medication(s) Prior to Admission medications   Medication Sig Start Date End Date Taking? Authorizing Provider  cyclobenzaprine (FLEXERIL) 10 MG tablet Take 1 tablet (10 mg total) by mouth 2 (two) times daily as needed for muscle spasms. 02/13/24  Yes Tanda Rockers A, DO  lidocaine (LIDODERM) 5 % Place 1 patch onto the skin daily as needed. Remove & Discard patch within 12 hours or as directed by MD 02/13/24  Yes Tanda Rockers A, DO  albuterol (  VENTOLIN HFA) 108 (90 Base) MCG/ACT inhaler Inhale 1-2 puffs into the lungs every 6 (six) hours as needed for wheezing or shortness of breath. 07/15/23   Horton, Kristie M, DO  busPIRone (BUSPAR) 7.5 MG tablet Take 1 tablet (7.5 mg total) by mouth 2 (two) times daily. 03/25/22   Nwoko, Tommas Olp, PA  doxycycline (VIBRAMYCIN) 100 MG capsule Take 1 capsule (100 mg total) by mouth 2 (two) times daily.  07/15/23   Horton, Clabe Seal, DO  famotidine (PEPCID) 40 MG tablet Take 40 mg by mouth 2 (two) times daily. 06/21/20   [provider]  gabapentin (NEURONTIN) 100 MG capsule Take 1 capsule (100 mg total) by mouth 3 (three) times daily. 06/11/21 06/11/22  Nwoko, Tommas Olp, PA  lidocaine (XYLOCAINE) 5 % ointment Apply 1 application topically every evening. 04/24/20   [provider]  methylPREDNISolone (MEDROL DOSEPAK) 4 MG TBPK tablet Take as directed 07/15/23   Horton, Clabe Seal, DO  Multiple Vitamin (MULTIVITAMIN) tablet Take 1 tablet by mouth daily.    [provider]  RABEprazole (ACIPHEX) 20 MG tablet Take 1 tablet by mouth daily as needed. 06/21/20 06/21/21  [provider]  vortioxetine HBr (TRINTELLIX) 10 MG TABS tablet Take 1 tablet (10 mg total) by mouth daily. 03/25/22   Meta Hatchet, PA                                                                                                                                    Past Surgical History Past Surgical History:  Procedure Laterality Date   BREAST BIOPSY Left    ESOPHAGOGASTRODUODENOSCOPY N/A 08/14/2015   Procedure: ESOPHAGOGASTRODUODENOSCOPY (EGD);  Surgeon: Jeani Hawking, MD;  Location: [facility];  Service: Endoscopy;  Laterality: N/A;   Family History Family History  Problem Relation Age of Onset   Heart attack Mother    Heart disease Mother    Sudden death Mother    Brain cancer Mother    Sudden death Sister    Heart attack Sister    Sudden death Maternal Grandmother    Fainting Maternal Grandmother    Heart attack Maternal Grandmother    Breast cancer Paternal Grandmother    Breast cancer Cousin    Breast cancer Paternal Aunt    Seizures Maternal Aunt     Social History Social History   Tobacco Use   Smoking status: Former    Current packs/day: 0.00    Types: Cigarettes    Quit date: 06/23/2012    Years since quitting: 11.6   Smokeless tobacco: Never  Vaping Use   Vaping status:  Never Used  Substance Use Topics   Alcohol use: Never   Drug use: No   Allergies Aspirin, Other, Adhesive [tape], and Contrast media [iodinated contrast media]  Review of Systems A thorough review of systems was obtained and all systems are negative except as noted in the HPI and PMH.  Physical Exam Vital Signs  I have reviewed the triage vital signs BP 121/66   Pulse 60   Temp 98 F (36.7 C)   Resp 17   Ht 5\' 10"  (1.778 m)   Wt 63.5 kg   LMP 02/13/2024 (Approximate)   SpO2 98%   BMI 20.09 kg/m  Physical Exam Vitals and nursing note reviewed.  Constitutional:      General: She is not in acute distress.    Appearance: Normal appearance.  HENT:     Head: Normocephalic and atraumatic.     Right Ear: External ear normal.     Left Ear: External ear normal.     Nose: Nose normal.     Mouth/Throat:     Mouth: Mucous membranes are moist.  Eyes:     General: No scleral icterus.       Right eye: No discharge.        Left eye: No discharge.  Cardiovascular:     Rate and Rhythm: Normal rate and regular rhythm.     Pulses: Normal pulses.     Heart sounds: Normal heart sounds.  Pulmonary:     Effort: Pulmonary effort is normal. No respiratory distress.     Breath sounds: Normal breath sounds. No stridor.  Chest:    Abdominal:     General: Abdomen is flat. There is no distension.     Palpations: Abdomen is soft.     Tenderness: There is no abdominal tenderness.  Musculoskeletal:     Cervical back: No rigidity.     Right lower leg: No edema.     Left lower leg: No edema.  Skin:    General: Skin is warm and dry.     Capillary Refill: Capillary refill takes less than 2 seconds.  Neurological:     Mental Status: She is alert.  Psychiatric:        Mood and Affect: Mood normal.        Behavior: Behavior normal. Behavior is cooperative.     ED Results and Treatments Labs (all labs ordered are listed, but only abnormal results are displayed) Labs Reviewed  CBC -  Abnormal; Notable for the following components:      Result Value   HCT 33.7 (*)    All other components within normal limits  BASIC METABOLIC PANEL  HCG, SERUM, QUALITATIVE  TROPONIN I (HIGH SENSITIVITY)                                                                                                                          Radiology DG Chest 2 View Result Date: 02/13/2024 CLINICAL DATA:  Chest pain.  Generalized chest discomfort yesterday. EXAM: CHEST - 2 VIEW COMPARISON:  07/15/2023 FINDINGS: The heart size and mediastinal contours are within normal limits. Both lungs are clear. The visualized skeletal structures are unremarkable. IMPRESSION: No active cardiopulmonary disease. Electronically Signed   By: Burman Nieves M.D.   On: 02/13/2024 00:39  Pertinent labs & imaging results that were available during my care of the patient were reviewed by me and considered in my medical decision making (see MDM for details).  Medications Ordered in ED Medications  HYDROcodone-acetaminophen (NORCO/VICODIN) 5-325 MG per tablet 1 tablet (1 tablet Oral Given 02/13/24 0319)  alum & mag hydroxide-simeth (MAALOX/MYLANTA) 200-200-20 MG/5ML suspension 30 mL (30 mLs Oral Given 02/13/24 0319)    And  lidocaine (XYLOCAINE) 2 % viscous mouth solution 15 mL (15 mLs Oral Given 02/13/24 0319)                                                                                                                                     Procedures Procedures  (including critical care time)  Medical Decision Making / ED Course    Medical Decision Making:    AIRAM RUNIONS is a 38 y.o. female with past medical history as below, significant for migraine, fibromyalgia, gastroparesis, hypertension, PTSD, seizures who presents to the ED with complaint of chest pain. The complaint involves an extensive differential diagnosis and also carries with it a high risk of complications and morbidity.  Serious etiology was  considered. Ddx includes but is not limited to: Differential includes all life-threatening causes for chest pain. This includes but is not exclusive to acute coronary syndrome, aortic dissection, pulmonary embolism, cardiac tamponade, community-acquired pneumonia, pericarditis, musculoskeletal chest wall pain, etc.   Complete initial physical exam performed, notably the patient was in no distress, HDS.    Reviewed and confirmed nursing documentation for past medical history, family history, social history.  Vital signs reviewed.         Brief summary: 38 year old female history as above here with chest pain.  Ongoing over the past few days.  Chest pain is reproducible on palpation.  Her exam is reassuring.  No murmur.  Labs are stable.  Troponin negative x 1, pain ongoing over 24 hours, do not need delta troponin.  EKG nonischemic.  Chest x-ray stable.  More suspicious for an atypical source of her pain, possible costochondritis given reproducibility of pain along the sternal border.   -CT coronary 11/19 WNL -echo 10/19 LVEF 55 to 60%, mild to moderate mitral regurg, no wall motion abnormalities    The patient's chest pain is not suggestive of pulmonary embolus, cardiac ischemia, aortic dissection, pericarditis, myocarditis, pulmonary embolism, pneumothorax, pneumonia, Zoster, or esophageal perforation, or other serious etiology.  Historically not abrupt in onset, tearing or ripping, pulses symmetric. EKG nonspecific for ischemia/infarction. No dysrhythmias, brugada, WPW, prolonged QT noted.   Troponin negative. CXR reviewed. Labs without demonstration of acute pathology unless otherwise noted above. Low HEART Score  Given the extremely low risk of these diagnoses further testing and evaluation for these possibilities does not appear to be indicated at this time. Patient in no distress and overall condition improved here in the ED. Detailed discussions were had with the patient regarding  current  findings, and need for close f/u with PCP or on call doctor. The patient has been instructed to return immediately if the symptoms worsen in any way for re-evaluation. Patient verbalized understanding and is in agreement with current care plan. All questions answered prior to discharge.           Additional history obtained: -Additional history obtained from friend -External records from outside source obtained and reviewed including: Chart review including previous notes, labs, imaging, consultation notes including  Prior labs and imaging, prior ED visit, home medications   Lab Tests: -I ordered, reviewed, and interpreted labs.   The pertinent results include:   Labs Reviewed  CBC - Abnormal; Notable for the following components:      Result Value   HCT 33.7 (*)    All other components within normal limits  BASIC METABOLIC PANEL  HCG, SERUM, QUALITATIVE  TROPONIN I (HIGH SENSITIVITY)    Notable for stable labs  EKG   EKG Interpretation Date/Time:  Saturday February 13 2024 00:11:24 EDT Ventricular Rate:  83 PR Interval:  186 QRS Duration:  76 QT Interval:  364 QTC Calculation: 427 R Axis:   84  Text Interpretation: Normal sinus rhythm Right atrial enlargement Minimal voltage criteria for LVH, may be normal variant ( Sokolow-Lyon ) When compared with ECG of 03-Dec-2022 13:49, PREVIOUS ECG IS PRESENT similar to prior no stemi Confirmed by Tanda Rockers ([id number]) on 02/13/2024 4:22:57 AM         Imaging Studies ordered: I ordered imaging studies including cxr I independently visualized the following imaging with scope of interpretation limited to determining acute life threatening conditions related to emergency care; findings noted above I independently visualized and interpreted imaging. I agree with the radiologist interpretation   Medicines ordered and prescription drug management: Meds ordered this encounter  Medications   HYDROcodone-acetaminophen  (NORCO/VICODIN) 5-325 MG per tablet 1 tablet    Refill:  0   AND Linked Order Group    alum & mag hydroxide-simeth (MAALOX/MYLANTA) 200-200-20 MG/5ML suspension 30 mL    lidocaine (XYLOCAINE) 2 % viscous mouth solution 15 mL   cyclobenzaprine (FLEXERIL) 10 MG tablet    Sig: Take 1 tablet (10 mg total) by mouth 2 (two) times daily as needed for muscle spasms.    Dispense:  20 tablet    Refill:  0   lidocaine (LIDODERM) 5 %    Sig: Place 1 patch onto the skin daily as needed. Remove & Discard patch within 12 hours or as directed by MD    Dispense:  15 patch    Refill:  0    -I have reviewed the patients home medicines and have made adjustments as needed   Consultations Obtained: na   Cardiac Monitoring: The patient was maintained on a cardiac monitor.  I personally viewed and interpreted the cardiac monitored which showed an underlying rhythm of: nsr Continuous pulse oximetry interpreted by myself, 100% on RA.    Social Determinants of Health:  Diagnosis or treatment significantly limited by social determinants of health: former smoker   Reevaluation: After the interventions noted above, I reevaluated the patient and found that they have improved  Co morbidities that complicate the patient evaluation  Past Medical History:  Diagnosis Date   Allergic rhinitis 01/24/2015   Arrhythmia Dx 2015   Back pain    Brain tumor (benign) (HCC)    Breast mass    Common migraine 04/15/2017   Depression Dx 2000   Fibromyalgia  Gastroparesis    GERD (gastroesophageal reflux disease) 03/13/2015   Headache    migraines   Heart murmur Dx 2015   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    Final Clinical Impression(s) / ED Diagnoses Final diagnoses:  Chest pain with low risk of acute coronary syndrome  Costochondral chest pain        Sloan Leiter, DO 02/13/24 0502

## 2024-02-13 NOTE — Discharge Instructions (Addendum)
Return to the Emergency Department if you have unusual chest pain, pressure, or discomfort, shortness of breath, nausea, vomiting, burping, heartburn, tingling upper body parts, sweating, cold, clammy skin, or racing heartbeat. Call 911 if you think you are having a heart attack. Follow cardiac diet - avoid fatty & fried foods, don't eat too much red meat, eat lots of fruits & vegetables, and dairy products should be low fat. Please lose weight if you are overweight. Become more active with walking, gardening, or any other activity that gets you to moving.   Please return to the emergency department immediately for any new or concerning symptoms, or if you get worse. 

## 2024-02-13 NOTE — ED Triage Notes (Signed)
 Says she has been having generalized chest discomfort yesterday that worsened throughout the day.   Pain radiates to left neck.   Thinks she may be developing a headache but is not sure.

## 2024-02-29 ENCOUNTER — Encounter (HOSPITAL_COMMUNITY): Payer: Self-pay | Admitting: *Deleted

## 2024-02-29 ENCOUNTER — Ambulatory Visit (INDEPENDENT_AMBULATORY_CARE_PROVIDER_SITE_OTHER)

## 2024-02-29 ENCOUNTER — Ambulatory Visit (HOSPITAL_COMMUNITY)
Admission: EM | Admit: 2024-02-29 | Discharge: 2024-02-29 | Disposition: A | Discharge status: Home / Self Care | Attending: Family Medicine | Admitting: Family Medicine

## 2024-02-29 DIAGNOSIS — S93492A Sprain of other ligament of left ankle, initial encounter: Secondary | ICD-10-CM

## 2024-02-29 NOTE — ED Provider Notes (Signed)
 [facility]    CSN: [id number] Arrival date & time: 02/29/24  4098      History   Chief Complaint Chief Complaint  Patient presents with   Foot Injury   Ankle Pain    HPI [name] is a 38 y.o. female.   For left foot pain.  Patient states that she was walking home and stepped in a hole and her ankle everted.  Patient states she has had significant pain with weightbearing since then.  Patient has a history of spinal issues and requires a cane to walk on the right side.  Patient states that she feels like she is unable to bear weight due to both pain as well as weakness at this time.  Denies any numbness tingling down the legs.  No pain in the toes.  Patient states that most of the pain is located behind the lateral malleolus.   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 Hgb electrophoresis   Hypertension Dx 20015   Lumbar radicular pain 11/22/2014   Pain disorder 08/29/2017   Panic attack Dx 2006   Positive depression screening 08/29/2017   PTSD (post-traumatic stress disorder) Dx 2013   Seizures (HCC) 2001   Vitamin D deficiency 01/24/2015    Patient Active Problem List   Diagnosis Date Noted   Side effect of medication 03/25/2022   Generalized anxiety disorder 06/11/2021   PTSD (post-traumatic stress disorder) 06/11/2021   Severe episode of recurrent major depressive disorder, 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/11/2021   Iron deficiency anemia due to chronic blood loss 11/22/2018   Pelvic pain in female 11/08/2018   Hypertension 08/30/2018   DUB (dysfunctional uterine bleeding) 06/17/2018    Fibroid uterus 01/18/2018   Cervical radiculopathy 01/01/2018   Pain disorder 08/29/2017   Neck pain without injury 08/24/2017   Chronic fatigue 05/15/2017   Common migraine 04/15/2017   Axillary mass, right 04/09/2017   History of bulimia nervosa 03/13/2015   GERD (gastroesophageal reflux disease) 03/13/2015   SOB (shortness of breath) 03/13/2015   H/O vitamin D deficiency 03/13/2015   Health care maintenance 01/25/2015   Chronic tension type headache 01/25/2015   Muscle pain, fibromyalgia 01/24/2015   Allergic rhinitis 01/24/2015   Lumbar radicular pain 11/22/2014   Thoracic neuralgia 11/22/2014   Back pain    Abdominal pain 06/06/2012   Chronic pain 06/06/2012   Depression 06/06/2012   HEMOPTYSIS UNSPECIFIED 12/14/2009   BREAST TENDERNESS 11/08/2009   NIPPLE DISCHARGE 11/08/2009   MENORRHAGIA 11/08/2009   DISORDER, BIPOLAR NOS 08/06/2007   PERSONALITY DISORDER 08/06/2007   MARIJUANA ABUSE 08/06/2007   NARCOTIC ABUSE 08/06/2007   SEIZURE DISORDER 08/06/2007   SOMATIZATION DISORDER 12/27/2005    Past Surgical History:  Procedure Laterality Date   BREAST BIOPSY Left    ESOPHAGOGASTRODUODENOSCOPY N/A 08/14/2015   Procedure: ESOPHAGOGASTRODUODENOSCOPY (EGD);  Surgeon: Jeani Hawking, MD;  Location: [facility];  Service: Endoscopy;  Laterality: N/A;    OB History     Gravida  1   Para  0   Term  0   Preterm  0   AB  1   Living  0      SAB  1   IAB  0   Ectopic  0   Multiple  0   Live Births  0            Home Medications    Prior to Admission medications   Medication Sig Start Date End Date Taking? Authorizing Provider  busPIRone (BUSPAR) 7.5 MG tablet Take 1 tablet (7.5 mg total) by mouth 2 (two) times daily. 03/25/22  Yes Nwoko, Tommas Olp, PA  cyclobenzaprine (FLEXERIL) 10 MG tablet Take 1 tablet (10 mg total) by mouth 2 (two) times daily as needed for muscle spasms. 02/13/24  Yes Tanda Rockers A, DO  Multiple Vitamin (MULTIVITAMIN) tablet Take  1 tablet by mouth daily.   Yes [provider]  albuterol (VENTOLIN HFA) 108 (90 Base) MCG/ACT inhaler Inhale 1-2 puffs into the lungs every 6 (six) hours as needed for wheezing or shortness of breath. 07/15/23   Horton, Clabe Seal, DO  doxycycline (VIBRAMYCIN) 100 MG capsule Take 1 capsule (100 mg total) by mouth 2 (two) times daily. 07/15/23   Horton, Clabe Seal, DO  famotidine (PEPCID) 40 MG tablet Take 40 mg by mouth 2 (two) times daily. 06/21/20   [provider]  gabapentin (NEURONTIN) 100 MG capsule Take 1 capsule (100 mg total) by mouth 3 (three) times daily. 06/11/21 06/11/22  Nwoko, Stephens Shire E, PA  lidocaine (LIDODERM) 5 % Place 1 patch onto the skin daily as needed. Remove & Discard patch within 12 hours or as directed by MD 02/13/24   Tanda Rockers A, DO  lidocaine (XYLOCAINE) 5 % ointment Apply 1 application topically every evening. 04/24/20   [provider]  methylPREDNISolone (MEDROL DOSEPAK) 4 MG TBPK tablet Take as directed 07/15/23   Horton, Kristie M, DO  RABEprazole (ACIPHEX) 20 MG tablet Take 1 tablet by mouth daily as needed. 06/21/20 06/21/21  [provider]  vortioxetine HBr (TRINTELLIX) 10 MG TABS tablet Take 1 tablet (10 mg total) by mouth daily. 03/25/22   Meta Hatchet, PA    Family History Family History  Problem Relation Age of Onset   Heart attack Mother    Heart disease Mother    Sudden death Mother    Brain cancer Mother    Sudden death Sister    Heart attack Sister    Sudden death Maternal Grandmother    Fainting Maternal Grandmother    Heart attack Maternal Grandmother    Breast cancer Paternal Grandmother    Breast cancer Cousin    Breast cancer Paternal Aunt    Seizures Maternal Aunt     Social History Social History   Tobacco Use   Smoking status: Former    Current packs/day: 0.00    Types: Cigarettes    Quit date: 06/23/2012    Years since quitting: 11.6   Smokeless tobacco: Never  Vaping Use   Vaping status: Never  Used  Substance Use Topics   Alcohol use: Never   Drug use: No     Allergies   Aspirin, Other, Adhesive [tape], and Contrast media [iodinated contrast media]   Review of Systems Review of Systems   Physical Exam Triage Vital Signs ED Triage Vitals  Encounter Vitals Group     BP 02/29/24 0908 109/70     Systolic BP Percentile --      Diastolic BP Percentile --  Pulse Rate 02/29/24 0908 78     Resp 02/29/24 0908 18     Temp 02/29/24 0908 97.9 F (36.6 C)     Temp Source 02/29/24 0908 Oral     SpO2 02/29/24 0908 97 %     Weight --      Height --      Head Circumference --      Peak Flow --      Pain Score 02/29/24 0906 6     Pain Loc --      Pain Education --      Exclude from Growth Chart --    No data found.  Updated Vital Signs BP 109/70 (BP Location: Left Arm)   Pulse 78   Temp 97.9 F (36.6 C) (Oral)   Resp 18   LMP 02/13/2024 (Approximate)   SpO2 97%   Visual Acuity Right Eye Distance:   Left Eye Distance:   Bilateral Distance:    Right Eye Near:   Left Eye Near:    Bilateral Near:     Physical Exam  Inspection reveals no gross swelling of the left ankle compared to the right.  There is some significant tenderness to palpation over the lateral malleolus as well as the ATFL.  No noted bruising of the plantar side of the foot.  Range of motion is full with pain with both plantar and dorsiflexion.  Patient unable to bear weight on foot UC Treatments / Results  Labs (all labs ordered are listed, but only abnormal results are displayed) Labs Reviewed - No data to display  EKG   Radiology No results found.  Procedures Procedures (including critical care time)  Medications Ordered in UC Medications - No data to display  Initial Impression / Assessment and Plan / UC Course  I have reviewed the triage vital signs and the nursing notes.  Pertinent labs & imaging results that were available during my care of the patient were reviewed by me  and considered in my medical decision making (see chart for details).     Patient presenting with left ankle pain after inversion injury.  Patient's x-rays do not show any obvious fracture though there could be some widening of the lateral fibula tibial space consistent with a high degree ankle sprain.  Will go ahead and place patient in a cam boot at this time.  Will give patient off work at all for the next 2 days.  Patient is advised to stay off the foot if she feels unstable and can start weightbearing as tolerated over the next few days.  Patient also advised the nonweightbearing if she feels like she cannot walk with both the cane and the cam boot.  Will have patient follow-up in sports medicine clinic in approximately 2 to 3 weeks. Final Clinical Impressions(s) / UC Diagnoses   Final diagnoses:  Sprain of anterior talofibular ligament of left ankle, initial encounter     Discharge Instructions      Please follow-up with me in the sports medicine clinic.  When you call make appointment with Dr. Benjiman Core.  Please wear the cam boot whenever you are planning on walking or putting any weight down.  If you plan on using a wheelchair then you do not necessarily need to wear the boot.  You may continue to ice the area 3 times a day for 20 minutes.  You may take Tylenol ibuprofen as needed for pain.     ED Prescriptions   None  PDMP not reviewed this encounter.   Brenton Grills, MD 02/29/24 1024

## 2024-02-29 NOTE — Discharge Instructions (Addendum)
 Please follow-up with me in the sports medicine clinic.  When you call make appointment with Dr. Benjiman Core.  Please wear the cam boot whenever you are planning on walking or putting any weight down.  If you plan on using a wheelchair then you do not necessarily need to wear the boot.  You may continue to ice the area 3 times a day for 20 minutes.  You may take Tylenol ibuprofen as needed for pain.

## 2024-02-29 NOTE — ED Triage Notes (Signed)
 Pt states she stepped in a hole last night and she is now having left foot and ankle pain. She does normally walk with a cane. She hasn't taken any meds.

## 2024-03-22 ENCOUNTER — Ambulatory Visit: Admitting: Family Medicine

## 2024-03-29 ENCOUNTER — Ambulatory Visit: Admitting: Family Medicine

## 2024-04-05 ENCOUNTER — Ambulatory Visit: Admitting: Family Medicine

## 2024-04-14 ENCOUNTER — Ambulatory Visit (HOSPITAL_COMMUNITY): Admitting: Clinical

## 2024-04-14 ENCOUNTER — Encounter (HOSPITAL_COMMUNITY): Payer: Self-pay

## 2024-05-01 ENCOUNTER — Ambulatory Visit
Admission: EM | Admit: 2024-05-01 | Discharge: 2024-05-01 | Disposition: A | Discharge status: Home / Self Care | Attending: Family Medicine | Admitting: Family Medicine

## 2024-05-01 ENCOUNTER — Ambulatory Visit (INDEPENDENT_AMBULATORY_CARE_PROVIDER_SITE_OTHER)

## 2024-05-01 DIAGNOSIS — K3184 Gastroparesis: Secondary | ICD-10-CM

## 2024-05-01 DIAGNOSIS — R140 Abdominal distension (gaseous): Secondary | ICD-10-CM | POA: Diagnosis not present

## 2024-05-01 DIAGNOSIS — R112 Nausea with vomiting, unspecified: Secondary | ICD-10-CM | POA: Diagnosis not present

## 2024-05-01 MED ORDER — METOCLOPRAMIDE HCL 10 MG PO TABS
10.0000 mg | ORAL_TABLET | Freq: Four times a day (QID) | ORAL | 0 refills | Status: DC
Start: 2024-05-01 — End: 2024-09-06

## 2024-05-01 MED ORDER — METOCLOPRAMIDE HCL 5 MG/ML IJ SOLN
10.0000 mg | Freq: Once | INTRAMUSCULAR | Status: AC
  Administered 2024-05-01: 10 mg via INTRAMUSCULAR

## 2024-05-01 NOTE — ED Triage Notes (Signed)
 Pt c/o abd bloating and n/v x 1 week-states feels like a gastroparesis flare-NAD-steady gait

## 2024-05-01 NOTE — ED Provider Notes (Signed)
 [facility]  Note:  This document was prepared using Conservation officer, historic buildings and may include unintentional dictation errors.  MRN: [id number] DOB: 06/30/1986  Subjective:   [name] is a 38 y.o. female presenting for 1 week history of persistent nausea, vomiting, abdominal bloating.  She has also had some intermittent diarrhea/loose stool.  Last bowel movement was today.  Had a small amount of loose stool.  Patient has longstanding history of GI symptoms.  She is working with gastroenterologist from [place].  Reports that she was diagnosed with gastroparesis and has been undergoing significant dietary changes.  She is also taking famotidine  for GERD.  Unfortunately this week her symptoms have flared. No fever, recent antibiotic use, hospitalizations or long distance travel.  Has not eaten raw foods, drank unfiltered water.  No history of Crohn's or ulcerative colitis.  No bloody stools.   No current facility-administered medications for this encounter.  Current Outpatient Medications:    albuterol  (VENTOLIN  HFA) 108 (90 Base) MCG/ACT inhaler, Inhale 1-2 puffs into the lungs every 6 (six) hours as needed for wheezing or shortness of breath., Disp: 1 each, Rfl: 0   busPIRone  (BUSPAR ) 7.5 MG tablet, Take 1 tablet (7.5 mg total) by mouth 2 (two) times daily., Disp: 60 tablet, Rfl: 1   cyclobenzaprine  (FLEXERIL ) 10 MG tablet, Take 1 tablet (10 mg total) by mouth 2 (two) times daily as needed for muscle spasms., Disp: 20 tablet, Rfl: 0   doxycycline  (VIBRAMYCIN ) 100 MG capsule, Take 1 capsule (100 mg total) by mouth 2 (two) times daily., Disp: 10 capsule, Rfl: 0   famotidine  (PEPCID ) 40 MG tablet, Take 40 mg by mouth 2 (two) times daily., Disp: , Rfl:    gabapentin  (NEURONTIN ) 100 MG capsule, Take 1 capsule (100 mg total) by mouth 3 (three) times daily., Disp: 90 capsule, Rfl: 1   lidocaine  (LIDODERM ) 5 %, Place 1 patch onto the skin daily as needed. Remove &  Discard patch within 12 hours or as directed by MD, Disp: 15 patch, Rfl: 0   lidocaine  (XYLOCAINE ) 5 % ointment, Apply 1 application topically every evening., Disp: , Rfl:    methylPREDNISolone  (MEDROL  DOSEPAK) 4 MG TBPK tablet, Take as directed, Disp: 1 each, Rfl: 0   Multiple Vitamin (MULTIVITAMIN) tablet, Take 1 tablet by mouth daily., Disp: , Rfl:    RABEprazole (ACIPHEX) 20 MG tablet, Take 1 tablet by mouth daily as needed., Disp: , Rfl:    vortioxetine  HBr (TRINTELLIX ) 10 MG TABS tablet, Take 1 tablet (10 mg total) by mouth daily., Disp: 30 tablet, Rfl: 1   Allergies  Allergen Reactions   Aspirin Hives   Other Itching and Rash    iodine   Adhesive [Tape]     Skin irritation   Contrast Media [Iodinated Contrast Media] Hives    Past Medical History:  Diagnosis Date   Allergic rhinitis 01/24/2015   Arrhythmia Dx 2015   Back pain    Brain tumor (benign) (HCC)    Breast mass    Common migraine 04/15/2017   Depression Dx 2000   Fibromyalgia    Gastroparesis    GERD (gastroesophageal reflux disease) 03/13/2015   Headache    migraines   Heart murmur Dx 2015   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 Hgb electrophoresis   Hypertension Dx 20015   Lumbar radicular pain 11/22/2014   Pain disorder 08/29/2017   Panic attack Dx 2006   Positive depression screening 08/29/2017   PTSD (post-traumatic  stress disorder) Dx 2013   Seizures (HCC) 2001   Vitamin D  deficiency 01/24/2015     Past Surgical History:  Procedure Laterality Date   BREAST BIOPSY Left    ESOPHAGOGASTRODUODENOSCOPY N/A 08/14/2015   Procedure: ESOPHAGOGASTRODUODENOSCOPY (EGD);  Surgeon: Alvis Jourdain, MD;  Location: [facility];  Service: Endoscopy;  Laterality: N/A;    Family History  Problem Relation Age of Onset   Heart attack Mother    Heart disease Mother    Sudden death Mother    Brain cancer Mother    Sudden death Sister    Heart attack Sister    Sudden death Maternal Grandmother    Fainting Maternal  Grandmother    Heart attack Maternal Grandmother    Breast cancer Paternal Grandmother    Breast cancer Cousin    Breast cancer Paternal Aunt    Seizures Maternal Aunt     Social History   Tobacco Use   Smoking status: Former    Current packs/day: 0.00    Types: Cigarettes    Quit date: 06/23/2012    Years since quitting: 11.8   Smokeless tobacco: Never  Vaping Use   Vaping status: Never Used  Substance Use Topics   Alcohol use: Never   Drug use: No    ROS   Objective:   Vitals: BP 136/86 (BP Location: Left Arm)   Pulse 90   Temp 98.9 F (37.2 C) (Oral)   Resp 20   LMP 04/27/2024   SpO2 96%   Physical Exam Constitutional:      General: She is not in acute distress.    Appearance: Normal appearance. She is well-developed. She is not ill-appearing, toxic-appearing or diaphoretic.  HENT:     Head: Normocephalic and atraumatic.     Nose: Nose normal.     Mouth/Throat:     Mouth: Mucous membranes are moist.     Pharynx: Oropharynx is clear.  Eyes:     General: No scleral icterus.       Right eye: No discharge.        Left eye: No discharge.     Extraocular Movements: Extraocular movements intact.     Conjunctiva/sclera: Conjunctivae normal.  Cardiovascular:     Rate and Rhythm: Normal rate.  Pulmonary:     Effort: Pulmonary effort is normal.  Abdominal:     General: Bowel sounds are normal. There is distension.     Palpations: Abdomen is soft. There is no mass.     Tenderness: There is generalized abdominal tenderness and tenderness in the epigastric area and periumbilical area. There is no right CVA tenderness, left CVA tenderness, guarding or rebound.  Skin:    General: Skin is warm and dry.  Neurological:     General: No focal deficit present.     Mental Status: She is alert and oriented to person, place, and time.  Psychiatric:        Mood and Affect: Mood normal.        Behavior: Behavior normal.        Thought Content: Thought content normal.         Judgment: Judgment normal.     IM Reglan  10 mg administered in clinic.  Assessment and Plan :   PDMP not reviewed this encounter.  1. Gastroparesis   2. Nausea and vomiting, unspecified vomiting type   3. Abdominal bloating    No obvious sign with of a bowel obstruction on imaging in clinic. X-ray over-read was pending at  time of discharge, recommended follow up with only abnormal results. Otherwise will not call for negative over-read. Patient was in agreement.  Recommend starting Reglan  and urgent follow-up with her gastroenterologist.  Counseled patient on potential for adverse effects with medications prescribed/recommended today, ER and return-to-clinic precautions discussed, patient verbalized understanding.    Adolph Hoop, [place] 05/01/24 [phone]

## 2024-05-02 ENCOUNTER — Telehealth: Payer: Self-pay

## 2024-05-04 ENCOUNTER — Ambulatory Visit: Payer: Self-pay | Admitting: Urgent Care

## 2024-06-16 ENCOUNTER — Encounter (HOSPITAL_COMMUNITY): Payer: Self-pay

## 2024-06-16 ENCOUNTER — Ambulatory Visit (HOSPITAL_COMMUNITY): Admitting: Clinical

## 2024-07-14 ENCOUNTER — Ambulatory Visit
Admission: EM | Admit: 2024-07-14 | Discharge: 2024-07-14 | Disposition: A | Discharge status: Home / Self Care | Attending: Family Medicine | Admitting: Family Medicine

## 2024-07-14 DIAGNOSIS — L089 Local infection of the skin and subcutaneous tissue, unspecified: Secondary | ICD-10-CM | POA: Diagnosis not present

## 2024-07-14 DIAGNOSIS — Z8619 Personal history of other infectious and parasitic diseases: Secondary | ICD-10-CM | POA: Diagnosis not present

## 2024-07-14 MED ORDER — DOXYCYCLINE HYCLATE 100 MG PO CAPS: 100.0000 mg | ORAL_CAPSULE | Freq: Two times a day (BID) | ORAL | 0 refills | Status: DC

## 2024-07-14 NOTE — Discharge Instructions (Signed)
 I suspect you have a skin infection which may not be related to your previous surgery in 2020. Use warm compresses. Aleve  for pain and inflammation. If no improvement in 48 hours and definitely if worse then go to the emergency room.

## 2024-07-14 NOTE — ED Triage Notes (Signed)
 Pt c/o abd incision pain and drainage x 3 days-no recent surgery/last abd surgery was Jan 2020 for uterine fibroids-NAD-steady gait

## 2024-07-14 NOTE — ED Provider Notes (Signed)
 [facility]  Note:  This document was prepared using Conservation officer, historic buildings and may include unintentional dictation errors.  MRN: [id number] DOB: [birth year]  Subjective:   [name] is a 38 y.o. female presenting for 3 days of moderate to severe pain over postsurgical scar of the left lower pelvic area.  Has noticed some drainage from said area.  Patient had a laparoscopic fibroidectomy with significant complications including postsurgical wound infection and wound dehiscence.  She has not needed followed up with her surgeon since 2020.  No current facility-administered medications for this encounter.  Current Outpatient Medications:    albuterol  (VENTOLIN  HFA) 108 (90 Base) MCG/ACT inhaler, Inhale 1-2 puffs into the lungs every 6 (six) hours as needed for wheezing or shortness of breath., Disp: 1 each, Rfl: 0   busPIRone  (BUSPAR ) 7.5 MG tablet, Take 1 tablet (7.5 mg total) by mouth 2 (two) times daily., Disp: 60 tablet, Rfl: 1   cyclobenzaprine  (FLEXERIL ) 10 MG tablet, Take 1 tablet (10 mg total) by mouth 2 (two) times daily as needed for muscle spasms., Disp: 20 tablet, Rfl: 0   doxycycline  (VIBRAMYCIN ) 100 MG capsule, Take 1 capsule (100 mg total) by mouth 2 (two) times daily., Disp: 10 capsule, Rfl: 0   famotidine  (PEPCID ) 40 MG tablet, Take 40 mg by mouth 2 (two) times daily., Disp: , Rfl:    gabapentin  (NEURONTIN ) 100 MG capsule, Take 1 capsule (100 mg total) by mouth 3 (three) times daily., Disp: 90 capsule, Rfl: 1   lidocaine  (LIDODERM ) 5 %, Place 1 patch onto the skin daily as needed. Remove & Discard patch within 12 hours or as directed by MD, Disp: 15 patch, Rfl: 0   lidocaine  (XYLOCAINE ) 5 % ointment, Apply 1 application topically every evening., Disp: , Rfl:    methylPREDNISolone  (MEDROL  DOSEPAK) 4 MG TBPK tablet, Take as directed, Disp: 1 each, Rfl: 0   metoCLOPramide  (REGLAN ) 10 MG tablet, Take 1 tablet (10 mg total) by mouth every 6  (six) hours., Disp: 30 tablet, Rfl: 0   Multiple Vitamin (MULTIVITAMIN) tablet, Take 1 tablet by mouth daily., Disp: , Rfl:    RABEprazole (ACIPHEX) 20 MG tablet, Take 1 tablet by mouth daily as needed., Disp: , Rfl:    vortioxetine  HBr (TRINTELLIX ) 10 MG TABS tablet, Take 1 tablet (10 mg total) by mouth daily., Disp: 30 tablet, Rfl: 1   Allergies  Allergen Reactions   Aspirin Hives   Adhesive [Tape]     Skin irritation   Contrast Media [Iodinated Contrast Media] Hives   Iodine Itching    Past Medical History:  Diagnosis Date   Allergic rhinitis 01/24/2015   Arrhythmia Dx 2015   Back pain    Brain tumor (benign) (HCC)    Breast mass    Common migraine 04/15/2017   Depression Dx 2000   Fibromyalgia    Gastroparesis    GERD (gastroesophageal reflux disease) 03/13/2015   Headache    migraines   Heart murmur Dx 2015   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 Hgb electrophoresis   Hypertension Dx 20015   Lumbar radicular pain 11/22/2014   Pain disorder 08/29/2017   Panic attack Dx 2006   Positive depression screening 08/29/2017   PTSD (post-traumatic stress disorder) Dx 2013   Seizures (HCC) 2001   Vitamin D  deficiency 01/24/2015     Past Surgical History:  Procedure Laterality Date   BREAST BIOPSY Left    ESOPHAGOGASTRODUODENOSCOPY N/A 08/14/2015   Procedure: ESOPHAGOGASTRODUODENOSCOPY (  EGD);  Surgeon: Belvie Just, MD;  Location: [facility];  Service: Endoscopy;  Laterality: N/A;    Family History  Problem Relation Age of Onset   Heart attack Mother    Heart disease Mother    Sudden death Mother    Brain cancer Mother    Sudden death Sister    Heart attack Sister    Sudden death Maternal Grandmother    Fainting Maternal Grandmother    Heart attack Maternal Grandmother    Breast cancer Paternal Grandmother    Breast cancer Cousin    Breast cancer Paternal Aunt    Seizures Maternal Aunt     Social History   Tobacco Use   Smoking status: Former    Current  packs/day: 0.00    Types: Cigarettes    Quit date: 06/23/2012    Years since quitting: 12.0   Smokeless tobacco: Never  Vaping Use   Vaping status: Never Used  Substance Use Topics   Alcohol use: Never   Drug use: No    ROS   Objective:   Vitals: BP (!) 145/94 (BP Location: Right Arm)   Pulse 92   Temp 98.9 F (37.2 C) (Oral)   Resp 20   LMP 07/07/2024 (Approximate)   SpO2 94%   Physical Exam Constitutional:      General: She is not in acute distress.    Appearance: Normal appearance. She is well-developed. She is not ill-appearing, toxic-appearing or diaphoretic.  HENT:     Head: Normocephalic and atraumatic.     Nose: Nose normal.     Mouth/Throat:     Mouth: Mucous membranes are moist.     Pharynx: Oropharynx is clear.  Eyes:     General: No scleral icterus.       Right eye: No discharge.        Left eye: No discharge.     Extraocular Movements: Extraocular movements intact.     Conjunctiva/sclera: Conjunctivae normal.  Cardiovascular:     Rate and Rhythm: Normal rate.  Pulmonary:     Effort: Pulmonary effort is normal.  Abdominal:     General: Bowel sounds are normal. There is no distension.     Palpations: Abdomen is soft. There is no mass.     Tenderness: There is no abdominal tenderness. There is no right CVA tenderness, left CVA tenderness, guarding or rebound.   Skin:    General: Skin is warm and dry.  Neurological:     General: No focal deficit present.     Mental Status: She is alert and oriented to person, place, and time.  Psychiatric:        Mood and Affect: Mood normal.        Behavior: Behavior normal.        Thought Content: Thought content normal.        Judgment: Judgment normal.       Assessment and Plan :   PDMP not reviewed this encounter.  1. Skin infection   2. History of wound infection    Recommended covering for secondary skin infection with doxycycline .  Counseled patient on potential for adverse effects with  medications prescribed/recommended today, ER and return-to-clinic precautions discussed, patient verbalized understanding.    Christopher Savannah, [place] 07/14/24 1718

## 2024-09-06 ENCOUNTER — Ambulatory Visit (HOSPITAL_COMMUNITY): Admission: EM | Admit: 2024-09-06 | Discharge: 2024-09-06 | Disposition: A | Discharge status: Home / Self Care

## 2024-09-06 ENCOUNTER — Encounter (HOSPITAL_COMMUNITY): Payer: Self-pay

## 2024-09-06 DIAGNOSIS — M546 Pain in thoracic spine: Secondary | ICD-10-CM

## 2024-09-06 DIAGNOSIS — R112 Nausea with vomiting, unspecified: Secondary | ICD-10-CM | POA: Diagnosis not present

## 2024-09-06 DIAGNOSIS — G8929 Other chronic pain: Secondary | ICD-10-CM

## 2024-09-06 DIAGNOSIS — R1084 Generalized abdominal pain: Secondary | ICD-10-CM

## 2024-09-06 LAB — POCT URINALYSIS DIP (MANUAL ENTRY)
Bilirubin, UA: NEGATIVE
Blood, UA: NEGATIVE
Glucose, UA: NEGATIVE mg/dL
Ketones, POC UA: NEGATIVE mg/dL
Leukocytes, UA: NEGATIVE
Nitrite, UA: NEGATIVE
Protein Ur, POC: NEGATIVE mg/dL
Spec Grav, UA: 1.02 (ref 1.010–1.025)
Urobilinogen, UA: 0.2 U/dL
pH, UA: 7 (ref 5.0–8.0)

## 2024-09-06 MED ORDER — ONDANSETRON 4 MG PO TBDP
8.0000 mg | ORAL_TABLET | Freq: Once | ORAL | Status: AC
  Administered 2024-09-06: 8 mg via ORAL

## 2024-09-06 MED ORDER — ONDANSETRON 4 MG PO TBDP
ORAL_TABLET | ORAL | Status: AC
  Filled 2024-09-06: qty 2

## 2024-09-06 MED ORDER — ONDANSETRON 8 MG PO TBDP: 8.0000 mg | ORAL_TABLET | Freq: Three times a day (TID) | ORAL | 0 refills | Status: AC | PRN

## 2024-09-06 MED ORDER — KETOROLAC TROMETHAMINE 30 MG/ML IJ SOLN
30.0000 mg | Freq: Once | INTRAMUSCULAR | Status: AC
  Administered 2024-09-06: 30 mg via INTRAMUSCULAR

## 2024-09-06 MED ORDER — KETOROLAC TROMETHAMINE 30 MG/ML IJ SOLN
INTRAMUSCULAR | Status: AC
Start: 2024-09-06 — End: 2024-09-06
  Filled 2024-09-06: qty 1

## 2024-09-06 NOTE — ED Provider Notes (Signed)
 [facility]    CSN: [id number] Arrival date & time: 09/06/24  1736      History   Chief Complaint Chief Complaint  Patient presents with   Back Pain   Emesis    HPI [name] is a 38 y.o. female.   Patient with a history of chronic back pain and gastroparesis, presents today due to severe back pain that started on Saturday with nausea and vomiting that started today.  Patient denies known injury, suspect food intake or suspect water intake, or known sick contacts.  Patient states that she has been experiencing more frequent urination.  Patient states that the back pain that she is feeling is different from her chronic back pain and the abdominal pain is different from her usual gastroparesis.  Patient states that she has not taken anything for pain as she cannot keep anything down.  Patient denies fever or chills.   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   Hypertension Dx 20015   Lumbar radicular pain 11/22/2014   Pain disorder 08/29/2017   Panic attack Dx 2006   Positive depression screening 08/29/2017   PTSD (post-traumatic stress disorder) Dx 2013   Seizures (HCC) 2001   Vitamin D  deficiency 01/24/2015    Patient Active Problem List   Diagnosis Date Noted   Side effect of medication 03/25/2022   Generalized anxiety disorder 06/11/2021   PTSD (post-traumatic stress disorder) 06/11/2021   Severe episode of recurrent major depressive disorder, 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/11/2021   Iron deficiency anemia due to chronic blood loss 11/22/2018   Pelvic pain in female 11/08/2018    Hypertension 08/30/2018   DUB (dysfunctional uterine bleeding) 06/17/2018   Fibroid uterus 01/18/2018   Cervical radiculopathy 01/01/2018   Pain disorder 08/29/2017   Neck pain without injury 08/24/2017   Chronic fatigue 05/15/2017   Common migraine 04/15/2017   Axillary mass, right 04/09/2017   History of bulimia nervosa 03/13/2015   GERD (gastroesophageal reflux disease) 03/13/2015   SOB (shortness of breath) 03/13/2015   H/O vitamin D  deficiency 03/13/2015   Health care maintenance 01/25/2015   Chronic tension type headache 01/25/2015   Muscle pain, fibromyalgia 01/24/2015   Allergic rhinitis 01/24/2015   Lumbar radicular pain 11/22/2014   Thoracic neuralgia 11/22/2014   Back pain    Abdominal pain 06/06/2012   Chronic pain 06/06/2012   Depression 06/06/2012   HEMOPTYSIS UNSPECIFIED 12/14/2009   BREAST TENDERNESS 11/08/2009   NIPPLE DISCHARGE 11/08/2009   MENORRHAGIA 11/08/2009   DISORDER, BIPOLAR NOS 08/06/2007   PERSONALITY DISORDER 08/06/2007   MARIJUANA ABUSE 08/06/2007   NARCOTIC ABUSE 08/06/2007   SEIZURE DISORDER 08/06/2007   SOMATIZATION DISORDER 12/27/2005    Past Surgical History:  Procedure Laterality Date   BREAST BIOPSY Left    ESOPHAGOGASTRODUODENOSCOPY N/A 08/14/2015   Procedure: ESOPHAGOGASTRODUODENOSCOPY (EGD);  Surgeon: Belvie Just, MD;  Location: [facility];  Service: Endoscopy;  Laterality: N/A;    OB History     Gravida  1   Para  0   Term  0   Preterm  0   AB  1   Living  0      SAB  1   IAB  0   Ectopic  0   Multiple  0   Live Births  0            Home Medications    Prior to Admission medications   Medication Sig Start Date End Date Taking? Authorizing Provider  albuterol  (VENTOLIN  HFA) 108 (90 Base) MCG/ACT inhaler Inhale 1-2 puffs into the lungs every 6 (six) hours as needed for wheezing or shortness of breath. 07/15/23  Yes Horton, Kristie M, DO  busPIRone  (BUSPAR ) 7.5 MG tablet Take 1 tablet (7.5 mg  total) by mouth 2 (two) times daily. 03/25/22  Yes Nwoko, Uchenna E, PA  Cholecalciferol 50 MCG (2000 UT) TABS Take 100 mcg by mouth. 01/19/20  Yes [provider]  cyanocobalamin (VITAMIN B12) 1000 MCG/ML injection Inject 1,000 mcg into the muscle. 01/20/23  Yes [provider]  Multiple Vitamins-Minerals (ONE DAILY CALCIUM/IRON) TABS Take 1 tablet by mouth daily.   Yes [provider]  ondansetron  (ZOFRAN -ODT) 8 MG disintegrating tablet Take 1 tablet (8 mg total) by mouth every 8 (eight) hours as needed for nausea or vomiting. 09/06/24  Yes Andra Krabbe C, PA-C  sertraline  (ZOLOFT ) 50 MG tablet Take 50 mg by mouth every morning.   Yes [provider]    Family History Family History  Problem Relation Age of Onset   Heart attack Mother    Heart disease Mother    Sudden death Mother    Brain cancer Mother    Sudden death Sister    Heart attack Sister    Sudden death Maternal Grandmother    Fainting Maternal Grandmother    Heart attack Maternal Grandmother    Breast cancer Paternal Grandmother    Breast cancer Cousin    Breast cancer Paternal Aunt    Seizures Maternal Aunt     Social History Social History   Tobacco Use   Smoking status: Former    Current packs/day: 0.00    Types: Cigarettes    Quit date: 06/23/2012    Years since quitting: 12.2   Smokeless tobacco: Never  Vaping Use   Vaping status: Never Used  Substance Use Topics   Alcohol use: Never   Drug use: No     Allergies   Aspirin, Adhesive [tape], Contrast media [iodinated contrast media], and Iodine   Review of Systems Review of Systems  Gastrointestinal:  Positive for vomiting.  Musculoskeletal:  Positive for back pain.     Physical Exam Triage Vital Signs ED Triage Vitals  Encounter Vitals Group     BP 09/06/24 1824 121/79     Girls Systolic BP Percentile --      Girls Diastolic BP Percentile --      Boys Systolic BP Percentile --      Boys Diastolic BP  Percentile --      Pulse Rate 09/06/24 1824 79     Resp 09/06/24 1824 18     Temp 09/06/24 1824 98.1 F (36.7 C)     Temp Source 09/06/24 1824 Oral     SpO2 09/06/24 1824 96 %     Weight 09/06/24 1824 150 lb (68 kg)     Height 09/06/24 1824 5' 10 (1.778 m)     Head Circumference --      Peak Flow --      Pain Score 09/06/24  1822 8     Pain Loc --      Pain Education --      Exclude from Growth Chart --    No data found.  Updated Vital Signs BP 121/79 (BP Location: Left Arm)   Pulse 79   Temp 98.1 F (36.7 C) (Oral)   Resp 18   Ht 5' 10 (1.778 m)   Wt 150 lb (68 kg)   LMP 08/29/2024 (Approximate)   SpO2 96%   BMI 21.52 kg/m   Visual Acuity Right Eye Distance:   Left Eye Distance:   Bilateral Distance:    Right Eye Near:   Left Eye Near:    Bilateral Near:     Physical Exam Vitals and nursing note reviewed.  Constitutional:      General: She is not in acute distress.    Appearance: Normal appearance. She is ill-appearing (appears to be in pain). She is not toxic-appearing or diaphoretic.  Eyes:     General: No scleral icterus. Cardiovascular:     Rate and Rhythm: Normal rate and regular rhythm.     Heart sounds: Normal heart sounds.  Pulmonary:     Effort: Pulmonary effort is normal. No respiratory distress.     Breath sounds: Normal breath sounds. No wheezing or rhonchi.  Abdominal:     General: Abdomen is flat. Bowel sounds are normal.     Palpations: Abdomen is soft.     Tenderness: There is abdominal tenderness in the right upper quadrant, right lower quadrant, suprapubic area and left upper quadrant. There is right CVA tenderness and left CVA tenderness.  Musculoskeletal:       Back:     Comments: Tenderness to palpation of central back where indicated on diagram  Skin:    General: Skin is warm.  Neurological:     Mental Status: She is alert and oriented to person, place, and time.  Psychiatric:        Mood and Affect: Mood normal.         Behavior: Behavior normal.      UC Treatments / Results  Labs (all labs ordered are listed, but only abnormal results are displayed) Labs Reviewed  POCT URINALYSIS DIP (MANUAL ENTRY) - Abnormal; Notable for the following components:      Result Value   Color, UA straw (*)    Clarity, UA cloudy (*)    All other components within normal limits    EKG   Radiology No results found.  Procedures Procedures (including critical care time)  Medications Ordered in UC Medications  ketorolac  (TORADOL ) 30 MG/ML injection 30 mg (30 mg Intramuscular Given 09/06/24 1909)  ondansetron  (ZOFRAN -ODT) disintegrating tablet 8 mg (8 mg Oral Given 09/06/24 1911)    Initial Impression / Assessment and Plan / UC Course  I have reviewed the triage vital signs and the nursing notes.  Pertinent labs & imaging results that were available during my care of the patient were reviewed by me and considered in my medical decision making (see chart for details).    Patient felt relief with use of Toradol  IM and Zofran  in office, urinalysis did not show any signs of urinary tract infection.  Patient advised that she should follow-up in the ED for further imaging studies and blood work to determine the cause of her pain.  Final diagnoses:  Nausea and vomiting, unspecified vomiting type  Chronic midline thoracic back pain     Discharge Instructions      Urinalysis  does not dictate that you have a urinary tract infection.  Due to the intensity of your pain I would like you to go to the ER for further evaluation.    ED Prescriptions     Medication Sig Dispense Auth. Provider   ondansetron  (ZOFRAN -ODT) 8 MG disintegrating tablet Take 1 tablet (8 mg total) by mouth every 8 (eight) hours as needed for nausea or vomiting. 20 tablet Andra Corean BROCKS, PA-C      PDMP not reviewed this encounter.   Andra Corean BROCKS, PA-C 09/06/24 1942

## 2024-09-06 NOTE — Discharge Instructions (Signed)
 Urinalysis does not dictate that you have a urinary tract infection.  Due to the intensity of your pain I would like you to go to the ER for further evaluation.

## 2024-09-06 NOTE — ED Notes (Signed)
 Patient is being discharged from the Urgent Care and sent to the Emergency Department via private vehicle . Per provider, patient is in need of higher level of care due to severe back pain, abd pain, nausea, and vomiting. Patient is aware and verbalizes understanding of plan of care.  Vitals:   09/06/24 1824  BP: 121/79  Pulse: 79  Resp: 18  Temp: 98.1 F (36.7 C)  SpO2: 96%

## 2024-09-06 NOTE — ED Triage Notes (Signed)
 Back and stomach pain, weakness, vomiting onset a dew days ago. Back pain started Saturday. No known falls or injuries. No new foods, meds or known sick exposure. Denies any urinary symptoms other than slight frequent urination.   Patient has not taken anything for her symptoms.
